# Patient Record
Sex: Male | Born: 1935 | Race: White | Hispanic: No | Smoking: Former smoker
Health system: Southern US, Community
[De-identification: ages and names within clinical notes are randomized; demographics above are authoritative.]

## PROBLEM LIST (undated history)

## (undated) DIAGNOSIS — M5416 Radiculopathy, lumbar region: Secondary | ICD-10-CM

## (undated) DIAGNOSIS — Z9289 Personal history of other medical treatment: Secondary | ICD-10-CM

## (undated) DIAGNOSIS — I4891 Unspecified atrial fibrillation: Secondary | ICD-10-CM

## (undated) DIAGNOSIS — M5136 Other intervertebral disc degeneration, lumbar region: Secondary | ICD-10-CM

## (undated) DIAGNOSIS — Z7901 Long term (current) use of anticoagulants: Secondary | ICD-10-CM

## (undated) DIAGNOSIS — U071 COVID-19: Secondary | ICD-10-CM

## (undated) DIAGNOSIS — I639 Cerebral infarction, unspecified: Secondary | ICD-10-CM

## (undated) DIAGNOSIS — M51369 Other intervertebral disc degeneration, lumbar region without mention of lumbar back pain or lower extremity pain: Secondary | ICD-10-CM

## (undated) DIAGNOSIS — I219 Acute myocardial infarction, unspecified: Secondary | ICD-10-CM

## (undated) DIAGNOSIS — I483 Typical atrial flutter: Secondary | ICD-10-CM

## (undated) DIAGNOSIS — Z9889 Other specified postprocedural states: Secondary | ICD-10-CM

## (undated) DIAGNOSIS — I10 Essential (primary) hypertension: Secondary | ICD-10-CM

## (undated) DIAGNOSIS — G4730 Sleep apnea, unspecified: Secondary | ICD-10-CM

## (undated) DIAGNOSIS — I350 Nonrheumatic aortic (valve) stenosis: Secondary | ICD-10-CM

## (undated) DIAGNOSIS — K219 Gastro-esophageal reflux disease without esophagitis: Secondary | ICD-10-CM

## (undated) DIAGNOSIS — E119 Type 2 diabetes mellitus without complications: Secondary | ICD-10-CM

## (undated) DIAGNOSIS — I2510 Atherosclerotic heart disease of native coronary artery without angina pectoris: Secondary | ICD-10-CM

## (undated) DIAGNOSIS — C130 Malignant neoplasm of postcricoid region: Secondary | ICD-10-CM

## (undated) DIAGNOSIS — I509 Heart failure, unspecified: Secondary | ICD-10-CM

## (undated) DIAGNOSIS — G459 Transient cerebral ischemic attack, unspecified: Secondary | ICD-10-CM

## (undated) DIAGNOSIS — I340 Nonrheumatic mitral (valve) insufficiency: Secondary | ICD-10-CM

## (undated) DIAGNOSIS — I050 Rheumatic mitral stenosis: Secondary | ICD-10-CM

## (undated) DIAGNOSIS — I351 Nonrheumatic aortic (valve) insufficiency: Secondary | ICD-10-CM

## (undated) DIAGNOSIS — R001 Bradycardia, unspecified: Secondary | ICD-10-CM

## (undated) DIAGNOSIS — E7800 Pure hypercholesterolemia, unspecified: Secondary | ICD-10-CM

## (undated) HISTORY — PX: CARDIAC ELECTROPHYSIOLOGY STUDY AND ABLATION: SHX1294

## (undated) HISTORY — PX: OTHER SURGICAL HISTORY: SHX169

## (undated) HISTORY — DX: Cerebral infarction, unspecified: I63.9

## (undated) HISTORY — DX: Bradycardia, unspecified: R00.1

## (undated) HISTORY — DX: Nonrheumatic aortic (valve) stenosis: I35.0

## (undated) HISTORY — DX: Personal history of other medical treatment: Z92.89

## (undated) HISTORY — DX: Long term (current) use of anticoagulants: Z79.01

## (undated) HISTORY — DX: Type 2 diabetes mellitus without complications: E11.9

## (undated) HISTORY — DX: Other intervertebral disc degeneration, lumbar region: M51.36

## (undated) HISTORY — DX: Typical atrial flutter: I48.3

## (undated) HISTORY — DX: Sleep apnea, unspecified: G47.30

## (undated) HISTORY — DX: COVID-19: U07.1

## (undated) HISTORY — DX: Other intervertebral disc degeneration, lumbar region without mention of lumbar back pain or lower extremity pain: M51.369

## (undated) HISTORY — DX: Nonrheumatic aortic (valve) insufficiency: I35.1

## (undated) HISTORY — DX: Other specified postprocedural states: Z98.890

## (undated) HISTORY — DX: Unspecified atrial fibrillation: I48.91

## (undated) HISTORY — DX: Rheumatic mitral stenosis: I05.0

## (undated) HISTORY — PX: CATARACT EXTRACTION: SUR2

## (undated) HISTORY — DX: Gastro-esophageal reflux disease without esophagitis: K21.9

## (undated) HISTORY — DX: Acute myocardial infarction, unspecified: I21.9

## (undated) HISTORY — DX: Malignant neoplasm of postcricoid region: C13.0

## (undated) HISTORY — PX: GALLBLADDER SURGERY: SHX652

## (undated) HISTORY — DX: Nonrheumatic mitral (valve) insufficiency: I34.0

## (undated) HISTORY — DX: Radiculopathy, lumbar region: M54.16

## (undated) HISTORY — PX: HX CATARACT REMOVAL: SHX102

## (undated) HISTORY — DX: Acute myocardial infarction, unspecified (CMS HCC): I21.9

## (undated) HISTORY — DX: Pure hypercholesterolemia, unspecified: E78.00

## (undated) HISTORY — DX: Unspecified atrial fibrillation (CMS HCC): I48.91

## (undated) HISTORY — DX: Cerebral infarction, unspecified (CMS HCC): I63.9

## (undated) HISTORY — DX: Essential (primary) hypertension: I10

## (undated) HISTORY — DX: Type 2 diabetes mellitus without complications (CMS HCC): E11.9

## (undated) HISTORY — DX: Heart failure, unspecified (CMS HCC): I50.9

## (undated) HISTORY — PX: HX GALL BLADDER SURGERY/CHOLE: SHX55

---

## 2014-01-23 DIAGNOSIS — M549 Dorsalgia, unspecified: Secondary | ICD-10-CM

## 2014-01-23 DIAGNOSIS — I959 Hypotension, unspecified: Secondary | ICD-10-CM

## 2014-01-23 DIAGNOSIS — IMO0002 Reserved for concepts with insufficient information to code with codable children: Secondary | ICD-10-CM

## 2014-01-23 DIAGNOSIS — S06330A Contusion and laceration of cerebrum, unspecified, without loss of consciousness, initial encounter: Secondary | ICD-10-CM

## 2014-01-24 DIAGNOSIS — J95821 Acute postprocedural respiratory failure: Secondary | ICD-10-CM

## 2014-01-25 DIAGNOSIS — D72829 Elevated white blood cell count, unspecified: Secondary | ICD-10-CM

## 2014-01-26 DIAGNOSIS — I10 Essential (primary) hypertension: Secondary | ICD-10-CM

## 2014-01-31 DIAGNOSIS — I808 Phlebitis and thrombophlebitis of other sites: Secondary | ICD-10-CM

## 2014-02-03 DIAGNOSIS — IMO0002 Reserved for concepts with insufficient information to code with codable children: Secondary | ICD-10-CM

## 2014-02-03 DIAGNOSIS — I4892 Unspecified atrial flutter: Secondary | ICD-10-CM

## 2014-02-04 DIAGNOSIS — L02519 Cutaneous abscess of unspecified hand: Secondary | ICD-10-CM

## 2014-02-04 DIAGNOSIS — L03119 Cellulitis of unspecified part of limb: Secondary | ICD-10-CM

## 2014-02-04 DIAGNOSIS — I808 Phlebitis and thrombophlebitis of other sites: Secondary | ICD-10-CM

## 2014-02-05 DIAGNOSIS — Z09 Encounter for follow-up examination after completed treatment for conditions other than malignant neoplasm: Secondary | ICD-10-CM

## 2014-02-18 DIAGNOSIS — Z09 Encounter for follow-up examination after completed treatment for conditions other than malignant neoplasm: Secondary | ICD-10-CM

## 2014-02-27 ENCOUNTER — Encounter (INDEPENDENT_AMBULATORY_CARE_PROVIDER_SITE_OTHER): Payer: Self-pay | Admitting: WVUPC-ELECTROPHYSIOLOGY

## 2014-03-26 ENCOUNTER — Ambulatory Visit (INDEPENDENT_AMBULATORY_CARE_PROVIDER_SITE_OTHER): Payer: Medicare Other | Admitting: Nurse Practitioner

## 2014-03-26 ENCOUNTER — Encounter (INDEPENDENT_AMBULATORY_CARE_PROVIDER_SITE_OTHER): Payer: Self-pay | Admitting: Nurse Practitioner

## 2014-03-26 VITALS — BP 126/77 | HR 109 | Ht 70.0 in | Wt 173.0 lb

## 2014-03-26 DIAGNOSIS — I2510 Atherosclerotic heart disease of native coronary artery without angina pectoris: Secondary | ICD-10-CM

## 2014-03-26 DIAGNOSIS — I498 Other specified cardiac arrhythmias: Secondary | ICD-10-CM

## 2014-03-26 DIAGNOSIS — Z7901 Long term (current) use of anticoagulants: Secondary | ICD-10-CM

## 2014-03-26 DIAGNOSIS — R001 Bradycardia, unspecified: Secondary | ICD-10-CM

## 2014-03-26 DIAGNOSIS — I10 Essential (primary) hypertension: Secondary | ICD-10-CM

## 2014-03-26 DIAGNOSIS — Z8673 Personal history of transient ischemic attack (TIA), and cerebral infarction without residual deficits: Secondary | ICD-10-CM

## 2014-03-26 DIAGNOSIS — I483 Typical atrial flutter: Secondary | ICD-10-CM | POA: Insufficient documentation

## 2014-03-26 DIAGNOSIS — Z79899 Other long term (current) drug therapy: Secondary | ICD-10-CM

## 2014-03-26 DIAGNOSIS — I4892 Unspecified atrial flutter: Secondary | ICD-10-CM

## 2014-03-26 NOTE — Patient Instructions (Signed)
Patient is to monitor blood pressure and/or heart rate as directed.  Patient is to notify clinic of any difficulties with prescribed treatment regimen or worsening of symptoms.

## 2014-03-26 NOTE — Procedures (Signed)
See progress note for ECG interpretation.

## 2014-03-26 NOTE — Progress Notes (Signed)
Medical Staff Office [facility]  Electrophysiology  [address], [address]  [place], [place] [address]          Patient name:  Wesley Huang  MRN:  [id number]  DOB:  12-Nov-1933  DATE:  03/26/2014  80 y.o.      Chief Complaint: Arrhythmia    History of Present Illness:  It was a pleasure to see your patient Wesley Huang, here at the [facility] for follow-up evaluation of Amiodarone therapy for treatment of atrial fibrillation.  The patient states he is taking his medication as directed.  The patient is tolerating the medication well.  There have been no episodes of significant palpitations since the last visit.  The patient denies any light-headedness, dizziness, syncope, or near syncope.  He states that he has been having issues with his shortness of breath and CHF, however it is improving over the last two weeks since his cardiologist started him on Lasix.    Past Medical History:  Past Medical History   Diagnosis Date    Atrial fibrillation     Congestive heart failure, unspecified     Diabetes mellitus, type 2     Hypercholesterolemia     Hypertension     MI (myocardial infarction)     CVA (cerebrovascular accident)          Past Surgical History:  Past Surgical History   Procedure Laterality Date    Hx cataract removal      Hx gall bladder surgery/chole         Social History:  History   Substance Use Topics    Smoking status: Never Smoker     Smokeless tobacco: Not on file    Alcohol Use: No     History   Drug Use No       Family History:  Family History:  Family History   Problem Relation Age of Onset    Healthy Son     Healthy Daughter     Healthy Daughter            Allergies:  No Known Allergies    Medications:  Current Outpatient Prescriptions   Medication Sig    ACETAMINOPHEN (TYLENOL 8 HOUR ORAL) Take by mouth    amLODIPine (NORVASC) 5 mg Oral Tablet Take 5 mg by mouth Once a day    clopidogrel (PLAVIX) 75 mg Oral Tablet Take 75 mg by mouth  Once a day    DIPHENHYDRAMINE HCL (BENADRYL ALLERGY ORAL) Take by mouth Once per day as needed    furosemide (LASIX) 40 mg Oral Tablet Take 40 mg by mouth Once a day    gabapentin (NEURONTIN) 300 mg Oral Capsule Take 300 mg by mouth Three times a day    lisinopril (PRINIVIL) 20 mg Oral Tablet Take 20 mg by mouth Once a day    metFORMIN (GLUCOPHAGE) 500 mg Oral Tablet Take 500 mg by mouth Twice daily with food    olmesartan (BENICAR) 20 mg Oral Tablet Take 20 mg by mouth Once a day Pt takes 12.5 mg daily    Oxycodone-Acetaminophen (PERCOCET) 7.5-325 mg Oral Tablet Take 1 Tab by mouth Every 4 hours as needed    pediatric multivitamins Oral Tablet, Chewable Take 1 Tab by mouth Once a day    rosuvastatin (CRESTOR) 10 mg Oral Tablet Take 10 mg by mouth Once a day    warfarin (COUMADIN) 2.5 mg Oral Tablet Take 2.5 mg by mouth Every evening  Review of Systems:  Complete review of systems negative with the exception of those things listed in the HPI and past medical history.      Examination:  BP 126/77 mmHg   Pulse 109   Ht 1.778 m ( )   Wt 78.472 kg (173 lb)   BMI 24.82 kg/m2  General: appears in good health  Eyes: Conjunctiva clear., Pupils equal and round.   HENT:  Head atraumatic and normocephalic  Neck: No JVD or thyromegaly or lymphadenopathy  Lungs: Clear to auscultation bilaterally.   Cardiovascular: regular rate and rhythm  Abdomen: Soft, non-tender, Bowel sounds normal  Extremities: No cyanosis or edema  Skin: Skin warm and dry, No rashes and No lesions  Neurologic: CN II - XII grossly intact , Alert and oriented x3  Lymphatics: No lymphadenopathy  Psychiatric: Appropriate mood and affect      Data reviewed:  An ECG dated 03/26/2014  shows Sinus Tachycardia at  107 bpm.  QRS 78 msec.  QT 298 msec.  QTc 361 msec.     Assessment and Plan:  1. Coronary artery disease involving native coronary artery without angina pectoris    2. H/O: CVA (cerebrovascular accident)    3. Sinus bradycardia    4.  Essential hypertension    5. Typical atrial flutter        The patient will continue with the current dose of Amiodarone 200 mg daily.  He will continue to obtain every 6 month liver function and thyroid profile testing.  He will also continue to obtain a yearly CXR.  The CHADS-VASc score is 6 resulting from CHF, Hypertension, Age > 75, Diabetes Mellitus and Stroke.  He will Continue use of Coumadin.   We have discussed the importance of compliance with the prescribed medication regimen.      It has been a pleasure to see your patient here at the [facility] Electrophysiology [facility].  Please do not hesitate to contact us with any questions or concerns.      Sylvester Harder, FNP-C

## 2014-05-04 DIAGNOSIS — Z79899 Other long term (current) drug therapy: Secondary | ICD-10-CM | POA: Insufficient documentation

## 2014-05-04 DIAGNOSIS — Z7901 Long term (current) use of anticoagulants: Secondary | ICD-10-CM | POA: Insufficient documentation

## 2014-05-29 ENCOUNTER — Encounter (INDEPENDENT_AMBULATORY_CARE_PROVIDER_SITE_OTHER): Payer: Self-pay

## 2014-05-29 ENCOUNTER — Ambulatory Visit (INDEPENDENT_AMBULATORY_CARE_PROVIDER_SITE_OTHER): Payer: Medicare Other

## 2014-05-29 VITALS — BP 127/81 | HR 96 | Ht 70.0 in | Wt 168.4 lb

## 2014-05-29 DIAGNOSIS — Z5181 Encounter for therapeutic drug level monitoring: Secondary | ICD-10-CM

## 2014-05-29 DIAGNOSIS — I483 Typical atrial flutter: Secondary | ICD-10-CM

## 2014-05-29 DIAGNOSIS — I513 Intracardiac thrombosis, not elsewhere classified: Secondary | ICD-10-CM

## 2014-05-29 DIAGNOSIS — I10 Essential (primary) hypertension: Secondary | ICD-10-CM

## 2014-05-29 DIAGNOSIS — R001 Bradycardia, unspecified: Secondary | ICD-10-CM

## 2014-05-29 DIAGNOSIS — I484 Atypical atrial flutter: Secondary | ICD-10-CM

## 2014-05-29 DIAGNOSIS — Z7901 Long term (current) use of anticoagulants: Secondary | ICD-10-CM

## 2014-05-29 DIAGNOSIS — Z79899 Other long term (current) drug therapy: Secondary | ICD-10-CM

## 2014-05-29 DIAGNOSIS — Z8673 Personal history of transient ischemic attack (TIA), and cerebral infarction without residual deficits: Secondary | ICD-10-CM

## 2014-05-29 DIAGNOSIS — I2510 Atherosclerotic heart disease of native coronary artery without angina pectoris: Secondary | ICD-10-CM

## 2014-05-29 DIAGNOSIS — I213 ST elevation (STEMI) myocardial infarction of unspecified site: Secondary | ICD-10-CM

## 2014-05-29 NOTE — Progress Notes (Signed)
[facility]rophysiology[facility]  [address], [address]  [place] [id number]  [phone]            Patient name:  Wesley Huang  MRN:  [id number]  DOB:  25-Dec-1933  DATE:  05/29/2014  AGE: 80 y.o.      Chief Complaint: Arrhythmia    History of Present Illness:  80 y.o. male with a history of atrial fibrillation and atypical flutter, coronary artery disease, and LA thrombus who presents for follow-up. He went to see his cardiologist several weeks ago and was found to be in atypical atrial flutter. He is completely asymptomatic. He states the checks his heart rate and blood pressure at home and denies any heart rates >100 beats per minute. He denies any chest pain, palpitations, or dizziness. He denies any blood or bleeding.    He is scheduled for a repeat TEE on 06/16/2014 to assess his left atrial thrombus.      Past Medical History:  Past Medical History   Diagnosis Date    Atrial fibrillation     Congestive heart failure, unspecified     Diabetes mellitus, type 2     Hypercholesterolemia     Hypertension     MI (myocardial infarction)     CVA (cerebrovascular accident)              Past Surgical History   Procedure Laterality Date    Hx cataract removal      Hx gall bladder surgery/chole             Medications:  Current Outpatient Prescriptions   Medication Sig    ACETAMINOPHEN (TYLENOL 8 HOUR ORAL) Take by mouth    amLODIPine (NORVASC) 10 mg Oral Tablet Take 10 mg by mouth Once a day    DIPHENHYDRAMINE HCL (BENADRYL ALLERGY ORAL) Take by mouth Once per day as needed    furosemide (LASIX) 40 mg Oral Tablet Take 40 mg by mouth Once a day    gabapentin (NEURONTIN) 300 mg Oral Capsule Take 300 mg by mouth Three times a day    metFORMIN (GLUCOPHAGE) 500 mg Oral Tablet Take 500 mg by mouth Twice daily with food    metoprolol (LOPRESSOR) 25 mg Oral Tablet Take 25 mg by mouth Twice daily    Olmesartan (BENICAR) 40 mg Oral Tablet Take 40 mg by mouth Once a day     rosuvastatin (CRESTOR) 10 mg Oral Tablet Take 10 mg by mouth Once a day    warfarin (COUMADIN) 2.5 mg Oral Tablet Take 2.5 mg by mouth Every evening       Allergies:  No Known Allergies    Family History:  Family History   Problem Relation Age of Onset    Healthy Son     Healthy Daughter     Healthy Daughter            Social History:  History   Substance Use Topics    Smoking status: Never Smoker     Smokeless tobacco: Never Used    Alcohol Use: No       Review of Systems:  11 point review of systems is otherwise negative except per hpi and pmh.    Examination:  BP 127/81 mmHg   Pulse 96   Ht 1.778 m (5\' 10" )   Wt 76.386 kg (168 lb 6.4 oz)   BMI 24.16 kg/m2  GEN- AO, NAD  HEENT- Anicteric, OP mmm  NECK- Supple, no  nuchal rigidity, no elevated JVD  PULM- CTAB, no w/r/s/c/acc muscle use  C/V- RR, S1 S2, no m/r/g, no RV heave  ABD- NABS, soft, nt/nd  EXT- No LE edema, RP 2+ Bil  NM- Sitting up independently  PSYCH- Normal mood and affect    Data reviewed:  An ECG dated today shows atypical flutter rate 91 beats per minute, QRS 86 msec, QT 428 msec, QTc 491 msec. Normal axis    Assessment and Plan:  1. Coronary artery disease involving native coronary artery without angina pectoris    2. H/O: CVA (cerebrovascular accident)    3. Sinus bradycardia    4. Typical atrial flutter    5. On amiodarone therapy    6. Anticoagulated on Coumadin    7. Essential hypertension    8. Left atrial thrombus      1) Atypical Flutter: Mr. Wesley Huang is back in atypical flutter despite amiodarone. He is completely asymptomatic and heart rate today is adequate. He is going to keep a log of his heart rates at home. As amiodarone has failed and he is asymptomatic, will stop amiodarone and pursue rate control strategy. Patient will keep a log of his heart rates and bring them to his next appointment. He needs to continue life long anticoagulation for stroke prevention.    Return in about 3 months (around 08/29/2014) for Atypical atrial  flutter .    It has been a [facility] your [facility]. Please do not hesitate to contact us with any questions or concerns.    Dayna Barkerania Rossi Silvestro, MD  05/29/2014

## 2014-05-29 NOTE — Patient Instructions (Signed)
-  Stop Amiodarone  -You have a TEE 06/16/2014 with Dr. Guinevere ScarletMandapaka  -Monitor your heart rates, log them   -If your resting heart rates are >100 beats per minute, call the office  -Continue Coumadin

## 2014-06-08 NOTE — Procedures (Signed)
See progress notes

## 2014-06-15 ENCOUNTER — Encounter (INDEPENDENT_AMBULATORY_CARE_PROVIDER_SITE_OTHER): Payer: Medicare Other | Admitting: Nurse Practitioner

## 2014-09-03 ENCOUNTER — Encounter (INDEPENDENT_AMBULATORY_CARE_PROVIDER_SITE_OTHER): Payer: Medicare Other | Admitting: NURSE PRACTITIONER

## 2016-11-08 DIAGNOSIS — Z9889 Other specified postprocedural states: Secondary | ICD-10-CM

## 2016-11-08 HISTORY — DX: Other specified postprocedural states: Z98.890

## 2017-07-31 LAB — HM COLONOSCOPY

## 2017-11-18 ENCOUNTER — Emergency Department (HOSPITAL_BASED_OUTPATIENT_CLINIC_OR_DEPARTMENT_OTHER)
Admission: EM | Admit: 2017-11-18 | Discharge: 2017-11-18 | Disposition: A | Discharge status: Home / Self Care | Payer: Medicare Other | Attending: Emergency Medicine | Admitting: Emergency Medicine

## 2017-11-18 ENCOUNTER — Emergency Department (HOSPITAL_BASED_OUTPATIENT_CLINIC_OR_DEPARTMENT_OTHER): Payer: Medicare Other

## 2017-11-18 ENCOUNTER — Other Ambulatory Visit: Payer: Self-pay

## 2017-11-18 ENCOUNTER — Encounter (HOSPITAL_BASED_OUTPATIENT_CLINIC_OR_DEPARTMENT_OTHER): Payer: Self-pay | Admitting: *Deleted

## 2017-11-18 DIAGNOSIS — R1031 Right lower quadrant pain: Secondary | ICD-10-CM | POA: Insufficient documentation

## 2017-11-18 DIAGNOSIS — R1032 Left lower quadrant pain: Secondary | ICD-10-CM | POA: Insufficient documentation

## 2017-11-18 DIAGNOSIS — R112 Nausea with vomiting, unspecified: Secondary | ICD-10-CM | POA: Insufficient documentation

## 2017-11-18 DIAGNOSIS — J189 Pneumonia, unspecified organism: Secondary | ICD-10-CM | POA: Diagnosis not present

## 2017-11-18 DIAGNOSIS — Z79899 Other long term (current) drug therapy: Secondary | ICD-10-CM | POA: Diagnosis not present

## 2017-11-18 DIAGNOSIS — I2510 Atherosclerotic heart disease of native coronary artery without angina pectoris: Secondary | ICD-10-CM | POA: Diagnosis not present

## 2017-11-18 DIAGNOSIS — I509 Heart failure, unspecified: Secondary | ICD-10-CM | POA: Insufficient documentation

## 2017-11-18 DIAGNOSIS — I110 Hypertensive heart disease with heart failure: Secondary | ICD-10-CM | POA: Diagnosis not present

## 2017-11-18 DIAGNOSIS — Z7901 Long term (current) use of anticoagulants: Secondary | ICD-10-CM | POA: Insufficient documentation

## 2017-11-18 HISTORY — DX: Atherosclerotic heart disease of native coronary artery without angina pectoris: I25.10

## 2017-11-18 HISTORY — DX: Essential (primary) hypertension: I10

## 2017-11-18 HISTORY — DX: Transient cerebral ischemic attack, unspecified: G45.9

## 2017-11-18 HISTORY — DX: Heart failure, unspecified: I50.9

## 2017-11-18 LAB — CBC WITH DIFFERENTIAL/PLATELET
BASOS PCT: 0 %
Basophils Absolute: 0 10*3/uL (ref 0.0–0.1)
EOS ABS: 0 10*3/uL (ref 0.0–0.7)
Eosinophils Relative: 0 %
HEMATOCRIT: 42.8 % (ref 39.0–52.0)
Hemoglobin: 14.8 g/dL (ref 13.0–17.0)
Lymphocytes Relative: 6 %
Lymphs Abs: 0.8 10*3/uL (ref 0.7–4.0)
MCH: 31 pg (ref 26.0–34.0)
MCHC: 34.6 g/dL (ref 30.0–36.0)
MCV: 89.5 fL (ref 78.0–100.0)
MONOS PCT: 4 %
Monocytes Absolute: 0.7 10*3/uL (ref 0.1–1.0)
NEUTROS ABS: 13.5 10*3/uL — AB (ref 1.7–7.7)
Neutrophils Relative %: 90 %
Platelets: 211 10*3/uL (ref 150–400)
RBC: 4.78 MIL/uL (ref 4.22–5.81)
RDW: 14 % (ref 11.5–15.5)
WBC: 15 10*3/uL — ABNORMAL HIGH (ref 4.0–10.5)

## 2017-11-18 LAB — I-STAT CG4 LACTIC ACID, ED: Lactic Acid, Venous: 1.92 mmol/L — ABNORMAL HIGH (ref 0.5–1.9)

## 2017-11-18 LAB — COMPREHENSIVE METABOLIC PANEL
ALK PHOS: 89 U/L (ref 38–126)
ALT: 22 U/L (ref 17–63)
AST: 25 U/L (ref 15–41)
Albumin: 4.2 g/dL (ref 3.5–5.0)
Anion gap: 10 (ref 5–15)
BUN: 18 mg/dL (ref 6–20)
CALCIUM: 8.7 mg/dL — AB (ref 8.9–10.3)
CO2: 21 mmol/L — AB (ref 22–32)
Chloride: 103 mmol/L (ref 101–111)
Creatinine, Ser: 1.04 mg/dL (ref 0.61–1.24)
GFR calc Af Amer: >60 mL/min (ref >60)
GFR calc non Af Amer: >60 mL/min (ref >60)
Glucose, Bld: 165 mg/dL — ABNORMAL HIGH (ref 65–99)
Potassium: 4.2 mmol/L (ref 3.5–5.1)
SODIUM: 134 mmol/L — AB (ref 135–145)
Total Bilirubin: 0.8 mg/dL (ref 0.3–1.2)
Total Protein: 7.5 g/dL (ref 6.5–8.1)

## 2017-11-18 LAB — URINALYSIS, ROUTINE W REFLEX MICROSCOPIC
Bilirubin Urine: NEGATIVE
Glucose, UA: NEGATIVE mg/dL
Ketones, ur: NEGATIVE mg/dL
Leukocytes, UA: NEGATIVE
NITRITE: NEGATIVE
PH: 7 (ref 5.0–8.0)
Protein, ur: NEGATIVE mg/dL
Specific Gravity, Urine: <1.005 — ABNORMAL LOW (ref 1.005–1.030)

## 2017-11-18 LAB — URINALYSIS, MICROSCOPIC (REFLEX)

## 2017-11-18 MED ORDER — AZITHROMYCIN 250 MG PO TABS: 250.0000 mg | ORAL_TABLET | Freq: Every day | ORAL | 0 refills | Status: DC

## 2017-11-18 MED ORDER — CEPHALEXIN 500 MG PO CAPS: 500.0000 mg | ORAL_CAPSULE | Freq: Two times a day (BID) | ORAL | 0 refills | Status: AC

## 2017-11-18 MED ORDER — ONDANSETRON 4 MG PO TBDP: ORAL_TABLET | ORAL | 0 refills | Status: DC

## 2017-11-18 MED ORDER — MORPHINE SULFATE (PF) 2 MG/ML IV SOLN
2.0000 mg | Freq: Once | INTRAVENOUS | Status: AC
  Administered 2017-11-18: 2 mg via INTRAVENOUS
  Filled 2017-11-18: qty 1

## 2017-11-18 MED ORDER — ONDANSETRON HCL 4 MG/2ML IJ SOLN
4.0000 mg | Freq: Once | INTRAMUSCULAR | Status: AC
  Administered 2017-11-18: 4 mg via INTRAVENOUS
  Filled 2017-11-18: qty 2

## 2017-11-18 MED ORDER — SODIUM CHLORIDE 0.9 % IV BOLUS
1000.0000 mL | Freq: Once | INTRAVENOUS | Status: AC
  Administered 2017-11-18: 1000 mL via INTRAVENOUS

## 2017-11-18 MED ORDER — IOPAMIDOL (ISOVUE-300) INJECTION 61%
100.0000 mL | Freq: Once | INTRAVENOUS | Status: AC | PRN
  Administered 2017-11-18: 100 mL via INTRAVENOUS

## 2017-11-18 NOTE — Discharge Instructions (Signed)
Follow up with your PCP, return for worsening or inability to eat or drink.

## 2017-11-18 NOTE — ED Provider Notes (Signed)
[place] EMERGENCY DEPARTMENT Provider Note   CSN: [id number] Arrival date & time: 11/18/17  1924     History   Chief Complaint Chief Complaint  Patient presents with  . Emesis  . Weakness    HPI Jose Patel is a 84 y.o. male.  84 yo M with a chief complaint of fever nausea and vomiting.  This been going on for about 8 hours.  Is not been able to keep anything down.  Is felt generally weak.  Denies cough or congestion denies myalgias.  He feels that he has pain in his abdomen is worse in the left lower quadrant.  Denies diarrhea.  Denies prior abdominal surgery.  Denies sick contacts.  The history is provided by the patient.  Abdominal Pain   This is a new problem. The current episode started 6 to 12 hours ago. The problem occurs constantly. The problem has not changed since onset.The pain is associated with an unknown factor. The pain is located in the LLQ. The quality of the pain is sharp and shooting. The pain is at a severity of 8/10. The pain is moderate. Associated symptoms include fever and vomiting. Pertinent negatives include diarrhea, headaches, arthralgias and myalgias. Nothing aggravates the symptoms. Nothing relieves the symptoms.    Past Medical History:  Diagnosis Date  . CHF (congestive heart failure) ([place])   . Coronary artery disease   . Hypertension   . Transient ischemic attack    10 to 12 years ago    There are no active problems to display for this patient.   Past Surgical History:  Procedure Laterality Date  . Debridement to right arm with MRSA infection     4 years ago        Home Medications    Prior to Admission medications   Medication Sig Start Date End Date Taking? Authorizing Provider  losartan (COZAAR) 25 MG tablet Take 25 mg by mouth daily.   Yes [provider]  rivaroxaban (XARELTO) 20 MG TABS tablet Take 20 mg by mouth daily with supper.   Yes [provider]  simvastatin (ZOCOR) 10 MG tablet Take  10 mg by mouth daily.   Yes [provider]  azithromycin (ZITHROMAX) 250 MG tablet Take 1 tablet (250 mg total) by mouth daily. Take first 2 tablets together, then 1 every day until finished. 11/18/17   Deno Etienne, DO  cephALEXin (KEFLEX) 500 MG capsule Take 1 capsule (500 mg total) by mouth 2 (two) times daily for 5 days. 11/18/17 11/23/17  Deno Etienne, DO  ondansetron (ZOFRAN ODT) 4 MG disintegrating tablet 4mg  ODT q4 hours prn nausea/vomit 11/18/17   Deno Etienne, DO    Family History Family History  Problem Relation Age of Onset  . Heart failure Mother   . Hyperlipidemia Mother     Social History Social History   Tobacco Use  . Smoking status: Never Smoker  Substance Use Topics  . Alcohol use: Never    Frequency: Never  . Drug use: Never     Allergies   Patient has no known allergies.   Review of Systems Review of Systems  Constitutional: Positive for fever. Negative for chills.  HENT: Negative for congestion and facial swelling.   Eyes: Negative for discharge and visual disturbance.  Respiratory: Negative for shortness of breath.   Cardiovascular: Negative for chest pain and palpitations.  Gastrointestinal: Positive for abdominal pain and vomiting. Negative for diarrhea.  Musculoskeletal: Negative for arthralgias and myalgias.  Skin:  Negative for color change and rash.  Neurological: Negative for tremors, syncope and headaches.  Psychiatric/Behavioral: Negative for confusion and dysphoric mood.     Physical Exam Updated Vital Signs BP (!) 158/73 (BP Location: Right Arm)   Pulse 78   Temp (!) 100.7 F (38.2 C) (Oral)   Resp 20   Ht 5\' 10"  (1.778 m)   Wt 79.4 kg (175 lb)   SpO2 96%   BMI 25.11 kg/m   Physical Exam  Constitutional: He is oriented to person, place, and time. He appears well-developed and well-nourished.  HENT:  Head: Normocephalic and atraumatic.  Eyes: Pupils are equal, round, and reactive to light. EOM are normal.  Neck: Normal  range of motion. Neck supple. No JVD present.  Cardiovascular: Normal rate and regular rhythm. Exam reveals no gallop and no friction rub.  No murmur heard. Pulmonary/Chest: No respiratory distress. He has no wheezes.  Abdominal: He exhibits no distension and no mass. There is tenderness (very mild LLQ pain). There is no rebound and no guarding.  Musculoskeletal: Normal range of motion.  Neurological: He is alert and oriented to person, place, and time.  Skin: No rash noted. No pallor.  Psychiatric: He has a normal mood and affect. His behavior is normal.  Nursing note and vitals reviewed.    ED Treatments / Results  Labs (all labs ordered are listed, but only abnormal results are displayed) Labs Reviewed  COMPREHENSIVE METABOLIC PANEL - Abnormal; Notable for the following components:      Result Value   Sodium 134 (*)    CO2 21 (*)    Glucose, Bld 165 (*)    Calcium 8.7 (*)    All other components within normal limits  CBC WITH DIFFERENTIAL/PLATELET - Abnormal; Notable for the following components:   WBC 15.0 (*)    Neutro Abs 13.5 (*)    All other components within normal limits  I-STAT CG4 LACTIC ACID, ED - Abnormal; Notable for the following components:   Lactic Acid, Venous 1.92 (*)    All other components within normal limits  CULTURE, BLOOD (ROUTINE X 2)  CULTURE, BLOOD (ROUTINE X 2)  URINE CULTURE  URINALYSIS, ROUTINE W REFLEX MICROSCOPIC    EKG EKG Interpretation  Date/Time:  Sunday November 18 2017 20:12:34 EDT Ventricular Rate:  64 PR Interval:    QRS Duration: 85 QT Interval:  451 QTC Calculation: 466 R Axis:   -16 Text Interpretation:  Sinus rhythm Borderline left axis deviation Baseline wander in lead(s) V4 No old tracing to compare Confirmed by Deno Etienne [phone]) on 11/18/2017 8:15:26 PM   Radiology Dg Chest 2 View  Result Date: 11/18/2017 CLINICAL DATA:  Nausea and vomiting EXAM: CHEST - 2 VIEW COMPARISON:  None. FINDINGS: Cardiac shadow is within  normal limits. Aortic calcifications are noted. The lungs are well aerated bilaterally. Bilateral nipple shadows are seen. Degenerative changes of the thoracic spine are noted. No focal infiltrate is seen. IMPRESSION: No acute abnormality noted. Electronically Signed   By: Inez Catalina M.D.   On: 11/18/2017 20:51   Ct Abdomen Pelvis W Contrast  Result Date: 11/18/2017 CLINICAL DATA:  Acute onset of right lower quadrant abdominal pain, nausea and vomiting. EXAM: CT ABDOMEN AND PELVIS WITH CONTRAST TECHNIQUE: Multidetector CT imaging of the abdomen and pelvis was performed using the standard protocol following bolus administration of intravenous contrast. CONTRAST:  138mL ISOVUE-300 IOPAMIDOL (ISOVUE-300) INJECTION 61% COMPARISON:  None. FINDINGS: Lower chest: Mild bibasilar opacities may reflect pneumonia. Would correlate  with the patient's symptoms. Scattered coronary artery calcifications are seen. Hepatobiliary: Scattered calcified granulomata are noted within the liver. A 1.0 cm hypodensity is noted at the right hepatic lobe. The liver is otherwise unremarkable. The gallbladder is unremarkable. The common bile duct is normal in caliber. Pancreas: The pancreas is unremarkable in appearance. Peripancreatic nodes measure up to 1.0 cm in short axis. Spleen: Scattered calcified granulomata are noted within the spleen. Adrenals/Urinary Tract: The adrenal glands are unremarkable in appearance. Nonspecific perinephric stranding is noted bilaterally. Tiny bilateral renal cysts are noted. There is no evidence of hydronephrosis. No renal or ureteral stones are identified. Stomach/Bowel: The stomach is unremarkable in appearance. The small bowel is within normal limits. The appendix is normal in caliber, without evidence of appendicitis. The colon is unremarkable in appearance. Vascular/Lymphatic: Diffuse calcification is seen along the abdominal aorta and its branches. The abdominal aorta is otherwise grossly  unremarkable. The inferior vena cava is grossly unremarkable. No retroperitoneal lymphadenopathy is seen. No pelvic sidewall lymphadenopathy is identified. Reproductive: The bladder is mildly distended and grossly unremarkable. The prostate is enlarged, measuring 5.0 cm in transverse dimension. Other: No additional soft tissue abnormalities are seen. Musculoskeletal: No acute osseous abnormalities are identified. Facet disease is noted at the lower lumbar spine. The visualized musculature is unremarkable in appearance. IMPRESSION: 1. No acute abnormality seen to explain the patient's symptoms. The appendix is unremarkable in appearance. 2. Mild bibasilar airspace opacities may reflect pneumonia. Would correlate with the patient's symptoms. 3. Scattered coronary artery calcifications. 4. Tiny bilateral renal cysts. Nonspecific 1.0 cm hypodensity at the right hepatic lobe. 5. Peripancreatic nodes measure up to 1.0 cm in short axis. 6. Enlarged prostate noted. Electronically Signed   By: Garald Balding M.D.   On: 11/18/2017 21:17    Procedures Procedures (including critical care time)  Medications Ordered in ED Medications  morphine 2 MG/ML injection 2 mg (2 mg Intravenous Given 11/18/17 2004)  ondansetron (ZOFRAN) injection 4 mg (4 mg Intravenous Given 11/18/17 2004)  sodium chloride 0.9 % bolus 1,000 mL (0 mLs Intravenous Stopped 11/18/17 2137)  iopamidol (ISOVUE-300) 61 % injection 100 mL (100 mLs Intravenous Contrast Given 11/18/17 2049)     Initial Impression / Assessment and Plan / ED Course  I have reviewed the triage vital signs and the nursing notes.  Pertinent labs & imaging results that were available during my care of the patient were reviewed by me and considered in my medical decision making (see chart for details).     56 yoM with a chief complaint of nausea and vomiting fever.  He is also complaining of left lower quadrant abdominal pain.  He is febrile here.  No significant pain on my  abdominal exam.  Will obtain labs chest x-ray urine CT scan.  Patient feeling much better on reassessment.  Requesting to go home. CXR negative but CT concerning for BLL pna.  Patient with some cough over the past couple days.  Will treat.  PCP follow up.   9:50 PM:  I have discussed the diagnosis/risks/treatment options with the patient and family and believe the pt to be eligible for discharge home to follow-up with PCP. We also discussed returning to the ED immediately if new or worsening sx occur. We discussed the sx which are most concerning (e.g., sudden worsening pain, fever, inability to tolerate by mouth) that necessitate immediate return. Medications administered to the patient during their visit and any new prescriptions provided to the patient are listed below.  Medications given during this visit Medications  morphine 2 MG/ML injection 2 mg (2 mg Intravenous Given 11/18/17 2004)  ondansetron [facility]) injection 4 mg (4 mg Intravenous Given 11/18/17 2004)  sodium chloride 0.9 % bolus 1,000 mL (0 mLs Intravenous Stopped 11/18/17 2137)  iopamidol (ISOVUE-300) 61 % injection 100 mL (100 mLs Intravenous Contrast Given 11/18/17 2049)    Labs reviewed mild leukocytosis, lactic 1.9, renal function at baseline Images reviewed cxr viewed by me without focal infiltrate   The patient appears reasonably screen and/or stabilized for discharge and I doubt any other medical condition or other [facility] requiring further screening, evaluation, or treatment in the ED at this time prior to discharge.    Final Clinical Impressions(s) / ED Diagnoses   Final diagnoses:  Community acquired pneumonia, unspecified laterality    ED Discharge Orders        Ordered    cephALEXin (KEFLEX) 500 MG capsule  2 times daily     11/18/17 2144    azithromycin (ZITHROMAX) 250 MG tablet  Daily     11/18/17 2144    ondansetron (ZOFRAN ODT) 4 MG disintegrating tablet     11/18/17 2144       Deno Etienne, DO 11/18/17  2150

## 2017-11-18 NOTE — ED Triage Notes (Signed)
Feeling nauseated at 1130 and vomited x 5 and is feeling weak.

## 2017-11-18 NOTE — ED Notes (Signed)
ED Provider at bedside. 

## 2017-11-20 LAB — URINE CULTURE

## 2017-11-24 LAB — CULTURE, BLOOD (ROUTINE X 2)
Culture: NO GROWTH
Culture: NO GROWTH
SPECIAL REQUESTS: ADEQUATE
Special Requests: ADEQUATE

## 2018-02-15 LAB — BASIC METABOLIC PANEL
BUN: 20 (ref 4–21)
BUN: 20 (ref 4–21)
Creatinine: 1 (ref 0.6–1.3)
Creatinine: 1 (ref 0.6–1.3)
GLUCOSE: 131
Glucose: 131
Potassium: 4.3 (ref 3.4–5.3)
Potassium: 4.3 (ref 3.4–5.3)
Sodium: 141 (ref 137–147)
Sodium: 141 (ref 137–147)

## 2018-02-15 LAB — LIPID PANEL
CHOLESTEROL: 127 (ref 0–200)
Cholesterol: 127 (ref 0–200)
HDL: 34 — AB (ref 35–70)
HDL: 34 — AB (ref 35–70)
LDL CALC: 63
LDL Cholesterol: 63
Triglycerides: 149 (ref 40–160)
Triglycerides: 149 (ref 40–160)

## 2018-02-19 DIAGNOSIS — Z9289 Personal history of other medical treatment: Secondary | ICD-10-CM

## 2018-02-19 HISTORY — DX: Personal history of other medical treatment: Z92.89

## 2018-06-10 ENCOUNTER — Ambulatory Visit (INDEPENDENT_AMBULATORY_CARE_PROVIDER_SITE_OTHER): Payer: Medicare Other | Admitting: Nurse Practitioner

## 2018-06-10 ENCOUNTER — Encounter: Payer: Self-pay | Admitting: Nurse Practitioner

## 2018-06-10 VITALS — BP 142/78 | HR 64 | Temp 98.0°F | Ht 69.0 in | Wt 173.2 lb

## 2018-06-10 DIAGNOSIS — I10 Essential (primary) hypertension: Secondary | ICD-10-CM | POA: Diagnosis not present

## 2018-06-10 DIAGNOSIS — E119 Type 2 diabetes mellitus without complications: Secondary | ICD-10-CM | POA: Insufficient documentation

## 2018-06-10 DIAGNOSIS — I509 Heart failure, unspecified: Secondary | ICD-10-CM | POA: Insufficient documentation

## 2018-06-10 DIAGNOSIS — G4730 Sleep apnea, unspecified: Secondary | ICD-10-CM | POA: Diagnosis not present

## 2018-06-10 DIAGNOSIS — E1140 Type 2 diabetes mellitus with diabetic neuropathy, unspecified: Secondary | ICD-10-CM

## 2018-06-10 DIAGNOSIS — I2510 Atherosclerotic heart disease of native coronary artery without angina pectoris: Secondary | ICD-10-CM | POA: Insufficient documentation

## 2018-06-10 DIAGNOSIS — G4733 Obstructive sleep apnea (adult) (pediatric): Secondary | ICD-10-CM | POA: Insufficient documentation

## 2018-06-10 DIAGNOSIS — I480 Paroxysmal atrial fibrillation: Secondary | ICD-10-CM | POA: Insufficient documentation

## 2018-06-10 DIAGNOSIS — H6123 Impacted cerumen, bilateral: Secondary | ICD-10-CM

## 2018-06-10 DIAGNOSIS — E785 Hyperlipidemia, unspecified: Secondary | ICD-10-CM

## 2018-06-10 NOTE — Patient Instructions (Addendum)
Follow up in January for AWV, fasting labs and EV  Make sure AWV and fasting labs are done prior to EV   Okay to use tylenol/ acetaminophen to avoid NSAIDS (Aleve, Advil, Motrin, Ibuprofen)    Constipation, Adult Constipation is when a person has fewer bowel movements in a week than normal, has difficulty having a bowel movement, or has stools that are dry, hard, or larger than normal. Constipation may be caused by an underlying condition. It may become worse with age if a person takes certain medicines and does not take in enough fluids. Follow these instructions at home: Eating and drinking   Eat foods that have a lot of fiber, such as fresh fruits and vegetables, whole grains, and beans.  Limit foods that are high in fat, low in fiber, or overly processed, such as french fries, hamburgers, cookies, candies, and soda.  Drink enough fluid to keep your urine clear or pale yellow. General instructions  Exercise regularly or as told by your health care provider.  Go to the restroom when you have the urge to go. Do not hold it in.  Take over-the-counter and prescription medicines only as told by your health care provider. These include any fiber supplements.  Practice pelvic floor retraining exercises, such as deep breathing while relaxing the lower abdomen and pelvic floor relaxation during bowel movements.  Watch your condition for any changes.  Keep all follow-up visits as told by your health care provider. This is important. Contact a health care provider if:  You have pain that gets worse.  You have a fever.  You do not have a bowel movement after 4 days.  You vomit.  You are not hungry.  You lose weight.  You are bleeding from the anus.  You have thin, pencil-like stools. Get help right away if:  You have a fever and your symptoms suddenly get worse.  You leak stool or have blood in your stool.  Your abdomen is bloated.  You have severe pain in your  abdomen.  You feel dizzy or you faint. This information is not intended to replace advice given to you by your health care provider. Make sure you discuss any questions you have with your health care provider. Document Released: 04/14/2004 Document Revised: 02/04/2016 Document Reviewed: 01/05/2016 Elsevier Interactive Patient Education  2018 [employer].

## 2018-06-10 NOTE — Progress Notes (Signed)
Careteam: Patient Care Team: [name], NP as PCP - [facility] ([place], [facility] (Gastroenterology)  Advanced Directive information Does Patient Have a Medical Advance Directive?: Yes, Type of Advance Directive: [place];Living will  No Known Allergies  Chief Complaint  Patient presents with  . Medical Management of Chronic Issues    Pt is being seen to establish care. Pt recently relocated from [facility]. Pt needs referral for cardiology  . Medication Refill    Pt needs rx for xarelto but he cannot afford it. Previous PCP was providing samples.      HPI: Patient is a 84 y.o. male seen in the office today to est care. Previously living in [place] but relocated to be close to family. Last OV in August.  Last physical was in December.   Hypertension- taking amlodipine 10 mg by mouth daily, hydralazine 25 mg by mouth three time daily, losartan 100 mg by mouth daily for blood pressure. Pt reports previous PCP was satisfied. Daughter reports she feels like he was not taking medication correctly before but now that he is blood pressure is better.   A fib- rate controlled- hx of cardiac ablation in 2016, currently on xarelto for anticoagulation.    Hx of aortic regurgitation, mitral regurgitation, aortic stenosis, mitral stenosis followed with cardiologist. Also noted CHF.  Hyperlipidemia- currently on simvastatin 20 mg by mouth daily   Hx of CVA- was on plavix prior to starting xarelto.   DM- took metformin for a while but has not needed it.   OSA- has a CPAP but only uses occasionally.   Review of Systems:  Review of Systems  Constitutional: Positive for malaise/fatigue. Negative for chills and fever.  Respiratory: Negative for cough and shortness of breath.   Cardiovascular: Negative for chest pain, palpitations and leg swelling.  Gastrointestinal: Positive for constipation. Negative for abdominal pain and diarrhea.  Genitourinary:  Positive for urgency.  Musculoskeletal: Positive for back pain and joint pain.       Reports arthritis.   Neurological: Positive for tingling (numbness to right hand since july, had CT scan.).    Past Medical History:  Diagnosis Date  . A-fib ([place])   . Aortic regurgitation   . Aortic stenosis   . CHF (congestive heart failure) ([place])   . Coronary artery disease   . Diabetes mellitus, type 2 ([place])   . Heart attack ([place])   . Hypertension   . Mitral regurgitation   . Mitral stenosis   . Sleep apnea   . Transient ischemic attack    10 to 12 years ago   Past Surgical History:  Procedure Laterality Date  . CARDIAC ELECTROPHYSIOLOGY STUDY AND ABLATION    . CATARACT EXTRACTION    . Debridement to right arm with MRSA infection     4 years ago   Social History:   reports that he has quit smoking. He has never used smokeless tobacco. He reports that he does not drink alcohol or use drugs.  Family History  Problem Relation Age of Onset  . Heart failure Mother   . Hyperlipidemia Mother   . Stroke Father   . Hypertension Son   . Hyperlipidemia Son   . Arthritis Son   . Arthritis Daughter   . Hypertension Son     Medications: Patient's Medications  New Prescriptions   No medications on file  Previous Medications   AMLODIPINE (NORVASC) 10 MG TABLET    Take 10 mg by  mouth daily.   HYDRALAZINE (APRESOLINE) 25 MG TABLET    Take 25 mg by mouth 3 (three) times daily.   LOSARTAN (COZAAR) 100 MG TABLET    Take 100 mg by mouth daily.   RIVAROXABAN (XARELTO) 20 MG TABS TABLET    Take 20 mg by mouth daily with supper.   SIMVASTATIN (ZOCOR) 20 MG TABLET    Take 20 mg by mouth daily.  Modified Medications   No medications on file  Discontinued Medications   No medications on file     Physical Exam:  Vitals:   06/10/18 0901  BP: (!) 142/78  Pulse: 64  Temp: 98 F (36.7 C)  TempSrc: Oral  SpO2: 95%  Weight: 173 lb 3.2 oz (78.6 kg)  Height: 5\' 9"  (1.753 m)   Body mass index  is 25.58 kg/m.  Physical Exam  Constitutional: He is oriented to person, place, and time. He appears well-developed and well-nourished. No distress.  HENT:  Head: Normocephalic and atraumatic.  Mouth/Throat: Oropharynx is clear and moist. No oropharyngeal exudate.  Cerumen impaction bilateral   Eyes: Pupils are equal, round, and reactive to light. Conjunctivae and EOM are normal.  Neck: Normal range of motion. Neck supple.  Cardiovascular: Normal rate, regular rhythm and normal heart sounds.  Pulmonary/Chest: Effort normal and breath sounds normal.  Abdominal: Soft. Bowel sounds are normal.  Musculoskeletal: He exhibits no edema or tenderness.  Neurological: He is alert and oriented to person, place, and time.  Skin: Skin is warm and dry. He is not diaphoretic.  Psychiatric: He has a normal mood and affect.    Labs reviewed: Basic Metabolic Panel: Recent Labs    11/18/17 2000  NA 134*  K 4.2  CL 103  CO2 21*  GLUCOSE 165*  BUN 18  CREATININE 1.04  CALCIUM 8.7*   Liver Function Tests: Recent Labs    11/18/17 2000  AST 25  ALT 22  ALKPHOS 89  BILITOT 0.8  PROT 7.5  ALBUMIN 4.2   No results for input(s): LIPASE, AMYLASE in the last 8760 hours. No results for input(s): AMMONIA in the last 8760 hours. CBC: Recent Labs    11/18/17 2000  WBC 15.0*  NEUTROABS 13.5*  HGB 14.8  HCT 42.8  MCV 89.5  PLT 211   Lipid Panel: No results for input(s): CHOL, HDL, LDLCALC, TRIG, CHOLHDL, LDLDIRECT in the last 8760 hours. TSH: No results for input(s): TSH in the last 8760 hours. A1C: No results found for: HGBA1C   Assessment/Plan 1. Sleep apnea, unspecified type Encouraged to wear CPAP.   2. Essential hypertension -stable, will continue current regimen.  - Ambulatory referral to Cardiology; Future  3. Type 2 diabetes mellitus with diabetic neuropathy, without long-term current use of insulin (HCC) -diet controlled.  - Hemoglobin A1c; Future  4. Paroxysmal  atrial fibrillation (HCC) -s/p ablation, rate controlled. Continues on xarelto 20 mg daily  - Ambulatory referral to Cardiology; Future - CBC with Differential/Platelets; Future  5. Congestive heart failure, unspecified HF chronicity, unspecified heart failure type ([place]) -euvolemic at this time. Will get records and referral to Cardiology for ongoing management.   6. Hyperlipidemia, unspecified hyperlipidemia type Continues on statin. Will follow up fasting blood work prior to next OV.  - COMPLETE METABOLIC PANEL WITH GFR; Future - Lipid Panel; Future  7. Impacted cerumen, bilateral - Ear Lavage, remains impacted, will use debrox and daughter plans to flush ears.   Next appt: 08/07/2018 Carlos American. Harle Battiest  [facility]  [facility] Medicine [phone]

## 2018-06-11 ENCOUNTER — Telehealth: Payer: Self-pay

## 2018-06-11 NOTE — Telephone Encounter (Signed)
SENT REFERRAL TO SCHEDULING AND FILED NOTES 

## 2018-07-04 ENCOUNTER — Ambulatory Visit (INDEPENDENT_AMBULATORY_CARE_PROVIDER_SITE_OTHER): Payer: Medicare Other | Admitting: Cardiovascular Disease

## 2018-07-04 VITALS — BP 162/60 | HR 53 | Ht 69.0 in | Wt 175.0 lb

## 2018-07-04 DIAGNOSIS — E7800 Pure hypercholesterolemia, unspecified: Secondary | ICD-10-CM

## 2018-07-04 DIAGNOSIS — I351 Nonrheumatic aortic (valve) insufficiency: Secondary | ICD-10-CM | POA: Diagnosis not present

## 2018-07-04 DIAGNOSIS — I5189 Other ill-defined heart diseases: Secondary | ICD-10-CM

## 2018-07-04 DIAGNOSIS — R001 Bradycardia, unspecified: Secondary | ICD-10-CM

## 2018-07-04 DIAGNOSIS — Z7901 Long term (current) use of anticoagulants: Secondary | ICD-10-CM | POA: Diagnosis not present

## 2018-07-04 DIAGNOSIS — I77810 Thoracic aortic ectasia: Secondary | ICD-10-CM

## 2018-07-04 DIAGNOSIS — I480 Paroxysmal atrial fibrillation: Secondary | ICD-10-CM

## 2018-07-04 DIAGNOSIS — G4733 Obstructive sleep apnea (adult) (pediatric): Secondary | ICD-10-CM

## 2018-07-04 DIAGNOSIS — I10 Essential (primary) hypertension: Secondary | ICD-10-CM

## 2018-07-04 NOTE — Patient Instructions (Signed)
Medication Instructions:  Dr Sallyanne Kuster recommends that you continue on your current medications as directed. Please refer to the Current Medication list given to you today.  If you need a refill on your cardiac medications before your next appointment, please call your pharmacy.   Testing/Procedures: Your physician has requested that you have an echocardiogram. Echocardiography is a painless test that uses sound waves to create images of your heart. It provides your doctor with information about the size and shape of your heart and how well your heart's chambers and valves are working. This procedure takes approximately one hour. There are no restrictions for this procedure.  >> This will be performed at our [facility] location [place], [place] [address] [phone]  Follow-Up: Dr Sallyanne Kuster recommends that you schedule a follow-up appointment in 12 months. You will receive a reminder letter in the mail two months in advance. If you don't receive a letter, please call our office to schedule the follow-up appointment.
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
## 2018-07-07 ENCOUNTER — Encounter: Payer: Self-pay | Admitting: Cardiovascular Disease

## 2018-07-07 DIAGNOSIS — I77810 Thoracic aortic ectasia: Secondary | ICD-10-CM | POA: Insufficient documentation

## 2018-07-07 DIAGNOSIS — I5189 Other ill-defined heart diseases: Secondary | ICD-10-CM | POA: Insufficient documentation

## 2018-07-07 DIAGNOSIS — R001 Bradycardia, unspecified: Secondary | ICD-10-CM | POA: Insufficient documentation

## 2018-07-07 DIAGNOSIS — E7800 Pure hypercholesterolemia, unspecified: Secondary | ICD-10-CM | POA: Insufficient documentation

## 2018-07-07 DIAGNOSIS — I351 Nonrheumatic aortic (valve) insufficiency: Secondary | ICD-10-CM | POA: Insufficient documentation

## 2018-07-07 DIAGNOSIS — Z7901 Long term (current) use of anticoagulants: Secondary | ICD-10-CM | POA: Insufficient documentation

## 2018-07-10 ENCOUNTER — Ambulatory Visit (HOSPITAL_COMMUNITY): Payer: Medicare Other | Attending: Cardiology

## 2018-07-10 ENCOUNTER — Other Ambulatory Visit: Payer: Self-pay

## 2018-07-10 DIAGNOSIS — I351 Nonrheumatic aortic (valve) insufficiency: Secondary | ICD-10-CM

## 2018-07-16 ENCOUNTER — Telehealth: Payer: Self-pay | Admitting: *Deleted

## 2018-07-16 NOTE — Telephone Encounter (Signed)
   [facility] Pre-operative Risk Assessment    Request for surgical clearance:  1. What type of surgery is being performed? BILATERAL UPPER EYELID BLEPHAROPTOSIS REPAIR WITH BILATERAL UPPER EYELID BLEPHAROPLASTY WITH BILATERAL DIRECT BROW LIFT     2. When is this surgery scheduled? 07/22/18   3. What type of clearance is required (medical clearance vs. Pharmacy clearance to hold med vs. Both)? BOTH  4. Are there any medications that need to be held prior to surgery and how long?[place]   5. Practice name and name of physician performing surgery? [facility]    6. What is your office phone number? [phone]     7.   What is your office fax number? [phone]   8.   Anesthesia type (None, local, MAC, general) ? MAC

## 2018-07-17 NOTE — Telephone Encounter (Signed)
Pt takes Xarelto for afib with CHADS2VASc score of 8 (age x2, CHF, HTN, DM, CAD, TIA). Renal function is normal. Recommend only holding Xarelto for 24 hours prior to procedure due to elevated cardiac risk.

## 2018-07-17 NOTE — Telephone Encounter (Signed)
I have reviewed his records from his previous physician and I think several of the diagnoses that are listed in his chart are not supported.  There is no true evidence that he has coronary artery disease, CHF and the history of TIA is also doubtful.  I would categorize his risk as moderate, CHADSVasc 4.  He can hold his Xarelto for 48 hours before the procedure.  This is a low risk procedure and further cardiac evaluation is not necessary.

## 2018-07-17 NOTE — Telephone Encounter (Signed)
   Primary Cardiologist: [name], MD  Chart reviewed as part of pre-operative protocol coverage.   Patient was just seen by Dr. Sallyanne Kuster 07/04/18, will ask him to weigh in on clearance - seems like low risk procedure under MAC. Dr. Sallyanne Kuster - Please route response to P CV DIV PREOP (the pre-op pool). Thank you.  Will route to pharmacy for input on Xarelto then plan to bundle recs.  Charlie Pitter, PA-C 07/17/2018, 2:40 PM

## 2018-08-07 ENCOUNTER — Other Ambulatory Visit: Payer: Medicare HMO

## 2018-08-07 DIAGNOSIS — I480 Paroxysmal atrial fibrillation: Secondary | ICD-10-CM

## 2018-08-07 DIAGNOSIS — E785 Hyperlipidemia, unspecified: Secondary | ICD-10-CM | POA: Diagnosis not present

## 2018-08-07 DIAGNOSIS — E1140 Type 2 diabetes mellitus with diabetic neuropathy, unspecified: Secondary | ICD-10-CM

## 2018-08-08 LAB — COMPLETE METABOLIC PANEL WITH GFR
AG Ratio: 1.4 (calc) (ref 1.0–2.5)
ALT: 22 U/L (ref 9–46)
AST: 16 U/L (ref 10–35)
Albumin: 4 g/dL (ref 3.6–5.1)
Alkaline phosphatase (APISO): 92 U/L (ref 40–115)
BUN/Creatinine Ratio: 26 (calc) — ABNORMAL HIGH (ref 6–22)
BUN: 28 mg/dL — ABNORMAL HIGH (ref 7–25)
CO2: 28 mmol/L (ref 20–32)
CREATININE: 1.09 mg/dL (ref 0.70–1.11)
Calcium: 9 mg/dL (ref 8.6–10.3)
Chloride: 108 mmol/L (ref 98–110)
GFR, EST AFRICAN AMERICAN: 73 mL/min/{1.73_m2} (ref >=60)
GFR, EST NON AFRICAN AMERICAN: 63 mL/min/{1.73_m2} (ref >=60)
Globulin: 2.9 g/dL (calc) (ref 1.9–3.7)
Glucose, Bld: 102 mg/dL — ABNORMAL HIGH (ref 65–99)
Potassium: 4.5 mmol/L (ref 3.5–5.3)
Sodium: 144 mmol/L (ref 135–146)
TOTAL PROTEIN: 6.9 g/dL (ref 6.1–8.1)
Total Bilirubin: 0.5 mg/dL (ref 0.2–1.2)

## 2018-08-08 LAB — CBC WITH DIFFERENTIAL/PLATELET
Absolute Monocytes: 504 cells/uL (ref 200–950)
Basophils Absolute: 7 cells/uL (ref 0–200)
Basophils Relative: 0.1 %
Eosinophils Absolute: 283 cells/uL (ref 15–500)
Eosinophils Relative: 4.1 %
HCT: 43.2 % (ref 38.5–50.0)
Hemoglobin: 14.7 g/dL (ref 13.2–17.1)
Lymphs Abs: 2187 cells/uL (ref 850–3900)
MCH: 31.4 pg (ref 27.0–33.0)
MCHC: 34 g/dL (ref 32.0–36.0)
MCV: 92.3 fL (ref 80.0–100.0)
MPV: 10.6 fL (ref 7.5–12.5)
Monocytes Relative: 7.3 %
Neutro Abs: 3919 cells/uL (ref 1500–7800)
Neutrophils Relative %: 56.8 %
Platelets: 224 10*3/uL (ref 140–400)
RBC: 4.68 10*6/uL (ref 4.20–5.80)
RDW: 12.4 % (ref 11.0–15.0)
TOTAL LYMPHOCYTE: 31.7 %
WBC: 6.9 10*3/uL (ref 3.8–10.8)

## 2018-08-08 LAB — LIPID PANEL
Cholesterol: 150 mg/dL (ref <200)
HDL: 35 mg/dL — ABNORMAL LOW (ref >40)
LDL Cholesterol (Calc): 96 mg/dL (calc)
Non-HDL Cholesterol (Calc): 115 mg/dL (calc) (ref <130)
Total CHOL/HDL Ratio: 4.3 (calc) (ref <5.0)
Triglycerides: 94 mg/dL (ref <150)

## 2018-08-08 LAB — HEMOGLOBIN A1C
Hgb A1c MFr Bld: 6.1 % of total Hgb — ABNORMAL HIGH (ref <5.7)
Mean Plasma Glucose: 128 (calc)
eAG (mmol/L): 7.1 (calc)

## 2018-08-12 ENCOUNTER — Encounter: Payer: Self-pay | Admitting: Nurse Practitioner

## 2018-08-12 ENCOUNTER — Ambulatory Visit (INDEPENDENT_AMBULATORY_CARE_PROVIDER_SITE_OTHER): Payer: Medicare HMO | Admitting: Nurse Practitioner

## 2018-08-12 ENCOUNTER — Ambulatory Visit: Payer: Medicare Other

## 2018-08-12 VITALS — BP 132/60 | HR 57 | Ht 69.0 in | Wt 172.0 lb

## 2018-08-12 VITALS — BP 132/60 | HR 57 | Ht 69.0 in | Wt 172.8 lb

## 2018-08-12 DIAGNOSIS — R350 Frequency of micturition: Secondary | ICD-10-CM

## 2018-08-12 DIAGNOSIS — E785 Hyperlipidemia, unspecified: Secondary | ICD-10-CM

## 2018-08-12 DIAGNOSIS — R413 Other amnesia: Secondary | ICD-10-CM | POA: Diagnosis not present

## 2018-08-12 DIAGNOSIS — E1140 Type 2 diabetes mellitus with diabetic neuropathy, unspecified: Secondary | ICD-10-CM

## 2018-08-12 DIAGNOSIS — I10 Essential (primary) hypertension: Secondary | ICD-10-CM | POA: Diagnosis not present

## 2018-08-12 DIAGNOSIS — I480 Paroxysmal atrial fibrillation: Secondary | ICD-10-CM | POA: Diagnosis not present

## 2018-08-12 DIAGNOSIS — Z0000 Encounter for general adult medical examination without abnormal findings: Secondary | ICD-10-CM

## 2018-08-12 DIAGNOSIS — R410 Disorientation, unspecified: Secondary | ICD-10-CM | POA: Diagnosis not present

## 2018-08-12 DIAGNOSIS — B372 Candidiasis of skin and nail: Secondary | ICD-10-CM

## 2018-08-12 DIAGNOSIS — H6123 Impacted cerumen, bilateral: Secondary | ICD-10-CM | POA: Diagnosis not present

## 2018-08-12 LAB — POCT URINALYSIS DIPSTICK
Blood, UA: NEGATIVE
Glucose, UA: NEGATIVE
Ketones, UA: NEGATIVE
Leukocytes, UA: NEGATIVE
Nitrite, UA: NEGATIVE
PH UA: 5 (ref 5.0–8.0)
Protein, UA: POSITIVE — AB
Urobilinogen, UA: NEGATIVE E.U./dL — AB

## 2018-08-12 MED ORDER — NYSTATIN 100000 UNIT/GM EX POWD: Freq: Three times a day (TID) | CUTANEOUS | 0 refills | Status: DC

## 2018-08-12 MED ORDER — HYDRALAZINE HCL 25 MG PO TABS: 25.0000 mg | ORAL_TABLET | Freq: Three times a day (TID) | ORAL | 2 refills | Status: DC

## 2018-08-12 MED ORDER — LOSARTAN POTASSIUM 100 MG PO TABS: 100.0000 mg | ORAL_TABLET | Freq: Every day | ORAL | 0 refills | Status: DC

## 2018-08-12 MED ORDER — RIVAROXABAN 20 MG PO TABS: 20.0000 mg | ORAL_TABLET | Freq: Every day | ORAL | 1 refills | Status: DC

## 2018-08-12 MED ORDER — ZOSTER VAC RECOMB ADJUVANTED 50 MCG/0.5ML IM SUSR: 0.5000 mL | Freq: Once | INTRAMUSCULAR | 0 refills | Status: AC

## 2018-08-12 MED ORDER — AMLODIPINE BESYLATE 10 MG PO TABS: 10.0000 mg | ORAL_TABLET | Freq: Every day | ORAL | 1 refills | Status: DC

## 2018-08-12 NOTE — Progress Notes (Signed)
`   Subjective:   Jose Patel is a 85 y.o. male who presents for Medicare Annual/Subsequent preventive examination.  Review of Systems:  Cardiac Risk Factors include: advanced age (>57men, >34 women);obesity (BMI >30kg/m2);sedentary lifestyle;male gender     Objective:    Vitals: BP 132/60   Pulse (!) 57   Ht 5\' 9"  (1.753 m)   Wt 172 lb 12.8 oz (78.4 kg)   SpO2 98%   BMI 25.52 kg/m   Body mass index is 25.52 kg/m.  Advanced Directives 08/12/2018 08/12/2018 06/10/2018 11/18/2017  Does Patient Have a Medical Advance Directive? Yes No Yes Yes  Type of Advance Directive - - [place];Living will [place];Living will  Does patient want to make changes to medical advance directive? No - Patient declined No - Patient declined - -  Copy of [place] in Chart? - - Yes - validated most recent copy scanned in chart (See row information) -    Tobacco Social History   Tobacco Use  Smoking Status Former Smoker  Smokeless Tobacco Never Used     Counseling given: Not Answered   Clinical Intake:  Pre-visit preparation completed: Yes  Pain : No/denies pain     BMI - recorded: 25.52 Nutritional Status: BMI 25 -29 Overweight Diabetes: No  How often do you need to have someone help you when you read instructions, pamphlets, or other written materials from your doctor or pharmacy?: 3 - Sometimes What is the last grade level you completed in school?: 12 years plus trade school   Interpreter Needed?: No     Past Medical History:  Diagnosis Date  . A-fib ([place])   . Aortic regurgitation   . Aortic stenosis   . CHF (congestive heart failure) ([place])   . Coronary artery disease   . Diabetes mellitus, type 2 ([place])   . Heart attack ([place])   . Hypertension   . Mitral regurgitation   . Mitral stenosis   . Sleep apnea   . Transient ischemic attack    10 to 12 years ago   Past Surgical History:  Procedure Laterality Date  .  CARDIAC ELECTROPHYSIOLOGY STUDY AND ABLATION    . CATARACT EXTRACTION    . Debridement to right arm with MRSA infection     4 years ago   Family History  Problem Relation Age of Onset  . Heart failure Mother   . Hyperlipidemia Mother   . Hypertension Son   . Hyperlipidemia Son   . Arthritis Son   . Arthritis Daughter   . Hypertension Son    Social History   Socioeconomic History  . Marital status: Married    Spouse name: Not on file  . Number of children: Not on file  . Years of education: Not on file  . Highest education level: Not on file  Occupational History  . Not on file  Social Needs  . Financial resource strain: Not on file  . Food insecurity:    Worry: Not on file    Inability: Not on file  . Transportation needs:    Medical: Not on file    Non-medical: Not on file  Tobacco Use  . Smoking status: Former Research scientist (life sciences)  . Smokeless tobacco: Never Used  Substance and Sexual Activity  . Alcohol use: Never    Frequency: Never  . Drug use: Never  . Sexual activity: Not Currently  Lifestyle  . Physical activity:    Days per week: Not  on file    Minutes per session: Not on file  . Stress: Not on file  Relationships  . Social connections:    Talks on phone: Not on file    Gets together: Not on file    Attends religious service: Not on file    Active member of club or organization: Not on file    Attends meetings of clubs or organizations: Not on file    Relationship status: Not on file  Other Topics Concern  . Not on file  Social History Narrative   Social History      Diet?       Do you drink/eat things with caffeine? yes      Marital status?          married                          What year were you married? 1957      Do you live in a house, apartment, assisted living, condo, trailer, etc.? home      Is it one or more stories? 1      How many persons live in your home? 4      Do you have any pets in your home? (please list) yes- 3 dogs, 1 cat       Highest level of education completed? 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           no                           Type & how often?      Advanced Directives      Do you have a living will? yes      Do you have a DNR form?                                  If not, do you want to discuss one? no      Do you have signed POA/HPOA for forms? yes      Functional Status      Do you have difficulty bathing or dressing yourself? no      Do you have difficulty preparing food or eating? no      Do you have difficulty managing your medications? no      Do you have difficulty managing your finances? no      Do you have difficulty affording your medications? Yes xarelto    Outpatient Encounter Medications as of 08/12/2018  Medication Sig  . amLODipine (NORVASC) 10 MG tablet Take 10 mg by mouth daily.  . hydrALAZINE (APRESOLINE) 25 MG tablet Take 25 mg by mouth 3 (three) times daily.  Marland Kitchen losartan (COZAAR) 100 MG tablet Take 100 mg by mouth daily.  . rivaroxaban (XARELTO) 20 MG TABS tablet Take 20 mg by mouth daily with supper.  . simvastatin (ZOCOR) 20 MG tablet Take 20 mg by mouth daily.   No facility-administered encounter medications on file as of 08/12/2018.     Activities of Daily Living In your present state of health, do you have any difficulty performing the following activities: 08/12/2018  Hearing? Y  Comment "ears stopped up"  Vision? N  Difficulty concentrating or making  decisions? Y  Walking or climbing stairs? N  Dressing or bathing? N  Doing errands, shopping? Y  Preparing Food and eating ? N  Using the Toilet? N  In the past six months, have you accidently leaked urine? Y  Do you have problems with loss of bowel control? N  Managing your Medications? Y  Managing your Finances? Y  Housekeeping or managing your Housekeeping? Y  Some recent data might be hidden    Patient Care Team: [name],  NP as PCP - General (Geriatric Medicine) [name], MD as PCP - Cardiology (Cardiology) Center, [facility] (Gastroenterology)   Assessment:   This is a routine wellness examination for [facility].  Exercise Activities and Dietary recommendations Current Exercise Habits: Structured exercise class, Time (Minutes): 10, Frequency (Times/Week): 1, Weekly Exercise (Minutes/Week): 10, Intensity: Mild, Exercise limited by: None identified  Goals    . DIET - INCREASE WATER INTAKE     Increase water intake and decrease soda     . Increase physical activity     To increase physical activity to 30 minutes 3 days a week        Fall Risk Fall Risk  08/12/2018 08/12/2018 06/10/2018  Falls in the past year? 0 0 0  Number falls in past yr: 0 0 -  Injury with Fall? 0 0 -   Is the patient's home free of loose throw rugs in walkways, pet beds, electrical cords, etc?   no      Grab bars in the bathroom? yes      Handrails on the stairs?   yes      Adequate lighting?   no   Depression Screen PHQ 2/9 Scores 08/12/2018 06/10/2018  PHQ - 2 Score 0 0    Cognitive Function MMSE - Mini Mental State Exam 08/12/2018  Orientation to time 4  Orientation to Place 3  Registration 3  Attention/ Calculation 1  Recall 1  Language- name 2 objects 2  Language- repeat 1  Language- follow 3 step command 3  Language- read & follow direction 0  Write a sentence 0  Copy design 0  Total score 18        Immunization History  Administered Date(s) Administered  . Influenza, High Dose Seasonal PF 05/08/2018    Qualifies for Shingles Vaccine? yes  Screening Tests Health Maintenance  Topic Date Due  . FOOT EXAM  10/27/1943  . OPHTHALMOLOGY EXAM  10/27/1945  . TETANUS/TDAP  10/28/1954  . PNA vac Low Risk Adult (1 of 2 - PCV13) 10/27/2000  . HEMOGLOBIN A1C  02/05/2019  . INFLUENZA VACCINE  Completed   Cancer Screenings: Lung: Low Dose CT Chest recommended if Age 62-80 years, 30 pack-year  currently smoking OR have quit w/in 15years. Patient does not qualify. Colorectal: last colonoscopy 3 years, done with screening.   Additional Screenings: Hepatitis C Screening: no. Low risk lifestyle.   waiting on records for immunizations.     Plan:     I have personally reviewed and noted the following in the patient's chart:   . Medical and social history . Use of alcohol, tobacco or illicit drugs  . Current medications and supplements . Functional ability and status . Nutritional status . Physical activity . Advanced directives . List of other physicians . Hospitalizations, surgeries, and ER visits in previous 12 months . Vitals . Screenings to include cognitive, depression, and falls . Referrals and appointments  In addition, I have reviewed and discussed with patient  certain preventive protocols, quality metrics, and best practice recommendations. A written personalized care plan for preventive services as well as general preventive health recommendations were provided to patient.     [name], NP  08/12/2018

## 2018-08-12 NOTE — Patient Instructions (Signed)
[name] , Thank you for taking time to come for your Medicare Wellness Visit. I appreciate your ongoing commitment to your health goals. Please review the following plan we discussed and let me know if I can assist you in the future.   Screening recommendations/referrals: Colonoscopy up to date Recommended yearly ophthalmology/optometry visit for glaucoma screening and checkup Recommended yearly dental visit for hygiene and checkup  Vaccinations: Influenza vaccine up to date Pneumococcal vaccine up to date- will follow up records  Tdap vaccine up to date- will follow up records Shingles vaccine - Rx printed.    Advanced directives: to complete and bring to office visit  Conditions/risks identified: memory loss   Next appointment: 1 year for AWV with EV after   Preventive Care 20 Years and Older, Male Preventive care refers to lifestyle choices and visits with your health care provider that can promote health and wellness. What does preventive care include?  A yearly physical exam. This is also called an annual well check.  Dental exams once or twice a year.  Routine eye exams. Ask your health care provider how often you should have your eyes checked.  Personal lifestyle choices, including:  Daily care of your teeth and gums.  Regular physical activity.  Eating a healthy diet.  Avoiding tobacco and drug use.  Limiting alcohol use.  Practicing safe sex.  Taking low doses of aspirin every day.  Taking vitamin and mineral supplements as recommended by your health care provider. What happens during an annual well check? The services and screenings done by your health care provider during your annual well check will depend on your age, overall health, lifestyle risk factors, and family history of disease. Counseling  Your health care provider may ask you questions about your:  Alcohol use.  Tobacco use.  Drug use.  Emotional well-being.  Home and relationship  well-being.  Sexual activity.  Eating habits.  History of falls.  Memory and ability to understand (cognition).  Work and work Statistician. Screening  You may have the following tests or measurements:  Height, weight, and BMI.  Blood pressure.  Lipid and cholesterol levels. These may be checked every 5 years, or more frequently if you are over 42 years old.  Skin check.  Lung cancer screening. You may have this screening every year starting at age 48 if you have a 30-pack-year history of smoking and currently smoke or have quit within the past 15 years.  Fecal occult blood test (FOBT) of the stool. You may have this test every year starting at age 59.  Flexible sigmoidoscopy or colonoscopy. You may have a sigmoidoscopy every 5 years or a colonoscopy every 10 years starting at age 32.  Prostate cancer screening. Recommendations will vary depending on your family history and other risks.  Hepatitis C blood test.  Hepatitis B blood test.  Sexually transmitted disease (STD) testing.  Diabetes screening. This is done by checking your blood sugar (glucose) after you have not eaten for a while (fasting). You may have this done every 1-3 years.  Abdominal aortic aneurysm (AAA) screening. You may need this if you are a current or former smoker.  Osteoporosis. You may be screened starting at age 7 if you are at high risk. Talk with your health care provider about your test results, treatment options, and if necessary, the need for more tests. Vaccines  Your health care provider may recommend certain vaccines, such as:  Influenza vaccine. This is recommended every year.  Tetanus,  diphtheria, and acellular pertussis (Tdap, Td) vaccine. You may need a Td booster every 10 years.  Zoster vaccine. You may need this after age 68.  Pneumococcal 13-valent conjugate (PCV13) vaccine. One dose is recommended after age 74.  Pneumococcal polysaccharide (PPSV23) vaccine. One dose is  recommended after age 51. Talk to your health care provider about which screenings and vaccines you need and how often you need them. This information is not intended to replace advice given to you by your health care provider. Make sure you discuss any questions you have with your health care provider. Document Released: 08/13/2015 Document Revised: 04/05/2016 Document Reviewed: 05/18/2015 Elsevier Interactive Patient Education  2017 [employer] Prevention in the Home Falls can cause injuries. They can happen to people of all ages. There are many things you can do to make your home safe and to help prevent falls. What can I do on the outside of my home?  Regularly fix the edges of walkways and driveways and fix any cracks.  Remove anything that might make you trip as you walk through a door, such as a raised step or threshold.  Trim any bushes or trees on the path to your home.  Use bright outdoor lighting.  Clear any walking paths of anything that might make someone trip, such as rocks or tools.  Regularly check to see if handrails are loose or broken. Make sure that both sides of any steps have handrails.  Any raised decks and porches should have guardrails on the edges.  Have any leaves, snow, or ice cleared regularly.  Use sand or salt on walking paths during winter.  Clean up any spills in your garage right away. This includes oil or grease spills. What can I do in the bathroom?  Use night lights.  Install grab bars by the toilet and in the tub and shower. Do not use towel bars as grab bars.  Use non-skid mats or decals in the tub or shower.  If you need to sit down in the shower, use a plastic, non-slip stool.  Keep the floor dry. Clean up any water that spills on the floor as soon as it happens.  Remove soap buildup in the tub or shower regularly.  Attach bath mats securely with double-sided non-slip rug tape.  Do not have throw rugs and other things on  the floor that can make you trip. What can I do in the bedroom?  Use night lights.  Make sure that you have a light by your bed that is easy to reach.  Do not use any sheets or blankets that are too big for your bed. They should not hang down onto the floor.  Have a firm chair that has side arms. You can use this for support while you get dressed.  Do not have throw rugs and other things on the floor that can make you trip. What can I do in the kitchen?  Clean up any spills right away.  Avoid walking on wet floors.  Keep items that you use a lot in easy-to-reach places.  If you need to reach something above you, use a strong step stool that has a grab bar.  Keep electrical cords out of the way.  Do not use floor polish or wax that makes floors slippery. If you must use wax, use non-skid floor wax.  Do not have throw rugs and other things on the floor that can make you trip. What can I do with  my stairs?  Do not leave any items on the stairs.  Make sure that there are handrails on both sides of the stairs and use them. Fix handrails that are broken or loose. Make sure that handrails are as long as the stairways.  Check any carpeting to make sure that it is firmly attached to the stairs. Fix any carpet that is loose or worn.  Avoid having throw rugs at the top or bottom of the stairs. If you do have throw rugs, attach them to the floor with carpet tape.  Make sure that you have a light switch at the top of the stairs and the bottom of the stairs. If you do not have them, ask someone to add them for you. What else can I do to help prevent falls?  Wear shoes that:  Do not have high heels.  Have rubber bottoms.  Are comfortable and fit you well.  Are closed at the toe. Do not wear sandals.  If you use a stepladder:  Make sure that it is fully opened. Do not climb a closed stepladder.  Make sure that both sides of the stepladder are locked into place.  Ask someone to  hold it for you, if possible.  Clearly mark and make sure that you can see:  Any grab bars or handrails.  First and last steps.  Where the edge of each step is.  Use tools that help you move around (mobility aids) if they are needed. These include:  Canes.  Walkers.  Scooters.  Crutches.  Turn on the lights when you go into a dark area. Replace any light bulbs as soon as they burn out.  Set up your furniture so you have a clear path. Avoid moving your furniture around.  If any of your floors are uneven, fix them.  If there are any pets around you, be aware of where they are.  Review your medicines with your doctor. Some medicines can make you feel dizzy. This can increase your chance of falling. Ask your doctor what other things that you can do to help prevent falls. This information is not intended to replace advice given to you by your health care provider. Make sure you discuss any questions you have with your health care provider. Document Released: 05/13/2009 Document Revised: 12/23/2015 Document Reviewed: 08/21/2014 Elsevier Interactive Patient Education  2017 [employer].

## 2018-08-12 NOTE — Addendum Note (Signed)
Addended by: Rafael Bihari A on: 08/12/2018 12:52 PM   Modules accepted: Orders

## 2018-08-12 NOTE — Progress Notes (Signed)
Provider: [name], NP  Patient Care Team: [name], NP as PCP - General (Geriatric Medicine) [name], MD as PCP - Cardiology (Cardiology) Center, [facility] (Gastroenterology)  Extended Emergency Contact Information Primary Emergency Contact: Henderson Cloud Mobile Phone: [phone] Relation: Daughter No Known Allergies Code Status: FULL Goals of Care: Advanced Directive information Advanced Directives 08/12/2018  Does Patient Have a Medical Advance Directive? Yes  Type of Advance Directive -  Does patient want to make changes to medical advance directive? No - Patient declined  Copy of [place] in Chart? -     Chief Complaint  Patient presents with  . extended visit    due for tdap vaccine    HPI: Patient is a 85 y.o. male seen in today for an annual wellness exam.    Diet? No restrictions  Exercise?limited     Dentition:every 6 months.   Ophthalmology appt: up to date, yearly. Recently had a Blepharoplasty, tacked up/lifted forehead.   Routine specialist: following with cardiology Had been following with neurologist in the past but no longer follows with one.   Hx of afib- continues on   DM- diet controlled, A1c of 6.1  Hyperlipidemia- LDL 96, on simvastatin, daughter would like to stop STATIN and place him on niacin based on her research with memory loss  Depression screen [facility] 2/9 08/12/2018 06/10/2018  Decreased Interest 0 0  Down, Depressed, Hopeless 0 0  PHQ - 2 Score 0 0    Fall Risk  08/12/2018 08/12/2018 06/10/2018  Falls in the past year? 0 0 0  Number falls in past yr: 0 0 -  Injury with Fall? 0 0 -   MMSE - [place] Exam 08/12/2018  Orientation to time 4  Orientation to Place 3  Registration 3  Attention/ Calculation 1  Recall 1  Language- name 2 objects 2  Language- repeat 1  Language- follow 3 step command 3  Language- read & follow direction 0  Write a sentence 0  Copy  design 0  Total score 18     Health Maintenance  Topic Date Due  . FOOT EXAM  10/27/1943  . OPHTHALMOLOGY EXAM  10/27/1943  . TETANUS/TDAP  10/28/1954  . PNA vac Low Risk Adult (1 of 2 - PCV13) 10/27/2000  . HEMOGLOBIN A1C  02/05/2019  . INFLUENZA VACCINE  Completed     Past Medical History:  Diagnosis Date  . A-fib ([place])   . Aortic regurgitation   . Aortic stenosis   . CHF (congestive heart failure) ([place])   . Coronary artery disease   . Diabetes mellitus, type 2 ([place])   . Heart attack ([place])   . Hypertension   . Mitral regurgitation   . Mitral stenosis   . Sleep apnea   . Transient ischemic attack    10 to 12 years ago    Past Surgical History:  Procedure Laterality Date  . CARDIAC ELECTROPHYSIOLOGY STUDY AND ABLATION    . CATARACT EXTRACTION    . Debridement to right arm with MRSA infection     4 years ago    Social History   Socioeconomic History  . Marital status: Married    Spouse name: Not on file  . Number of children: Not on file  . Years of education: Not on file  . Highest education level: Not on file  Occupational History  . Not on file  Social Needs  . Financial resource strain: Not on file  .  Food insecurity:    Worry: Not on file    Inability: Not on file  . Transportation needs:    Medical: Not on file    Non-medical: Not on file  Tobacco Use  . Smoking status: Former Research scientist (life sciences)  . Smokeless tobacco: Never Used  Substance and Sexual Activity  . Alcohol use: Never    Frequency: Never  . Drug use: Never  . Sexual activity: Not Currently  Lifestyle  . Physical activity:    Days per week: Not on file    Minutes per session: Not on file  . Stress: Not on file  Relationships  . Social connections:    Talks on phone: Not on file    Gets together: Not on file    Attends religious service: Not on file    Active member of club or organization: Not on file    Attends meetings of clubs or organizations: Not on file    Relationship status:  Not on file  Other Topics Concern  . Not on file  Social History Narrative   Social History      Diet?       Do you drink/eat things with caffeine? yes      Marital status?          married                          What year were you married? 1957      Do you live in a house, apartment, assisted living, condo, trailer, etc.? home      Is it one or more stories? 1      How many persons live in your home? 4      Do you have any pets in your home? (please list) yes- 3 dogs, 1 cat      Highest level of education completed? 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           no                           Type & how often?      Advanced Directives      Do you have a living will? yes      Do you have a DNR form?                                  If not, do you want to discuss one? no      Do you have signed POA/HPOA for forms? yes      Functional Status      Do you have difficulty bathing or dressing yourself? no      Do you have difficulty preparing food or eating? no      Do you have difficulty managing your medications? no      Do you have difficulty managing your finances? no      Do you have difficulty affording your medications? Yes xarelto    Family History  Problem Relation Age of Onset  . Heart failure Mother   . Hyperlipidemia Mother   . Hypertension Son   . Hyperlipidemia Son   . Arthritis Son   . Arthritis Daughter   . Hypertension Son  Review of Systems:  Review of Systems  Constitutional: Positive for malaise/fatigue. Negative for chills and fever.  Respiratory: Negative for cough and shortness of breath.   Cardiovascular: Negative for chest pain, palpitations and leg swelling.  Gastrointestinal: Positive for constipation. Negative for abdominal pain and diarrhea.  Genitourinary: Positive for urgency.  Musculoskeletal: Positive for back pain and joint pain.       Reports arthritis.     Neurological: Positive for tingling (numbness to right hand since july, had CT scan- unchanged.).     Allergies as of 08/12/2018   No Known Allergies     Medication List       Accurate as of August 12, 2018 11:32 AM. Always use your most recent med list.        amLODipine 10 MG tablet Commonly known as:  NORVASC Take 10 mg by mouth daily.   hydrALAZINE 25 MG tablet Commonly known as:  APRESOLINE Take 25 mg by mouth 3 (three) times daily.   losartan 100 MG tablet Commonly known as:  COZAAR Take 100 mg by mouth daily.   rivaroxaban 20 MG Tabs tablet Commonly known as:  XARELTO Take 20 mg by mouth daily with supper.   simvastatin 20 MG tablet Commonly known as:  ZOCOR Take 20 mg by mouth daily.         Physical Exam: Vitals:   08/12/18 1103  BP: 132/60  Pulse: (!) 57  SpO2: 98%  Weight: 172 lb (78 kg)  Height: 5\' 9"  (1.753 m)   Body mass index is 25.4 kg/m. Physical Exam Constitutional:      General: He is not in acute distress.    Appearance: He is well-developed. He is not diaphoretic.  HENT:     Head: Normocephalic and atraumatic.     Mouth/Throat:     Pharynx: No oropharyngeal exudate.  Eyes:     Conjunctiva/sclera: Conjunctivae normal.     Pupils: Pupils are equal, round, and reactive to light.  Neck:     Musculoskeletal: Normal range of motion and neck supple.  Cardiovascular:     Rate and Rhythm: Normal rate and regular rhythm.     Heart sounds: Normal heart sounds.  Pulmonary:     Effort: Pulmonary effort is normal.     Breath sounds: Normal breath sounds.  Abdominal:     General: Bowel sounds are normal.     Palpations: Abdomen is soft.  Musculoskeletal:        General: No tenderness.  Skin:    General: Skin is warm and dry.     Findings: Rash (yeast to right groin) present.  Neurological:     Mental Status: He is alert and oriented to person, place, and time.     Labs reviewed: Basic Metabolic Panel: Recent Labs     11/18/17 2000 08/07/18 1015  NA 134* 144  K 4.2 4.5  CL 103 108  CO2 21* 28  GLUCOSE 165* 102*  BUN 18 28*  CREATININE 1.04 1.09  CALCIUM 8.7* 9.0   Liver Function Tests: Recent Labs    11/18/17 2000 08/07/18 1015  AST 25 16  ALT 22 22  ALKPHOS 89  --   BILITOT 0.8 0.5  PROT 7.5 6.9  ALBUMIN 4.2  --    No results for input(s): LIPASE, AMYLASE in the last 8760 hours. No results for input(s): AMMONIA in the last 8760 hours. CBC: Recent Labs    11/18/17 2000 08/07/18 1015  WBC 15.0* 6.9  NEUTROABS  13.5* 3,919  HGB 14.8 14.7  HCT 42.8 43.2  MCV 89.5 92.3  PLT 211 224   Lipid Panel: Recent Labs    08/07/18 1015  CHOL 150  HDL 35*  LDLCALC 96  TRIG 94  CHOLHDL 4.3   Lab Results  Component Value Date   HGBA1C 6.1 (H) 08/07/2018    Procedures: No results found.  Assessment/Plan 1. Urinary frequency -UA with trace protein but without leukocytes, blood or nitrites  - PSA  2. Memory loss -daughter reports worsening memory in the last few months. MMSE 18/30, CT of head done in July 2019 showing chronic changes small vessel disease.  - B12 and Folate Panel - TSH - Vitamin D, 25-hydroxy  3. Impacted cerumen, bilateral Ear lavage done bilateral, mostly clear however deep impaction remained to right ear. To use debrox twice daily for 4 days and then to make a return appt for lavage.   4. Essential hypertension Stable on current regimen.  - amLODipine (NORVASC) 10 MG tablet; Take 1 tablet (10 mg total) by mouth daily.  Dispense: 30 tablet; Refill: 1 - hydrALAZINE (APRESOLINE) 25 MG tablet; Take 1 tablet (25 mg total) by mouth 3 (three) times daily.  Dispense: 30 tablet; Refill: 2 - losartan (COZAAR) 100 MG tablet; Take 1 tablet (100 mg total) by mouth daily.  Dispense: 30 tablet; Refill: 0  5. Type 2 diabetes mellitus with diabetic neuropathy, without long-term current use of insulin (HCC) -diet controlled, diet modifications - Hemoglobin A1c; Future  6.  Paroxysmal atrial fibrillation (HCC) Reg rate today, continue on xarelto for anticoagulation  - rivaroxaban (XARELTO) 20 MG TABS tablet; Take 1 tablet (20 mg total) by mouth daily with supper.  Dispense: 30 tablet; Refill: 1  7. Hyperlipidemia, unspecified hyperlipidemia type Daughter would like him to stop statin due to memory loss, has niacin at home and would like him to use this as supplement with diet modification.  -plans to do this for 3 months and follow up lipid at this time.  - Lipid Panel; Future  8. Yeast dermatitis - nystatin (MYCOSTATIN/NYSTOP) powder; Apply topically 3 (three) times daily.  Dispense: 15 g; Refill: 0   To sign record release for immunizations as these are not on file and we do not have record Next appt: 3 months with Dr Mariea Clonts.  Carlos American. [place], [facility] [phone]

## 2018-08-13 ENCOUNTER — Other Ambulatory Visit: Payer: Self-pay | Admitting: Nurse Practitioner

## 2018-08-13 LAB — VITAMIN D 25 HYDROXY (VIT D DEFICIENCY, FRACTURES): Vit D, 25-Hydroxy: 23 ng/mL — ABNORMAL LOW (ref 30–100)

## 2018-08-13 LAB — TEST AUTHORIZATION

## 2018-08-13 LAB — TSH: TSH: 1.88 mIU/L (ref 0.40–4.50)

## 2018-08-13 LAB — PSA: PSA: 1.3 ng/mL (ref <=4.0)

## 2018-08-13 MED ORDER — VITAMIN D (ERGOCALCIFEROL) 1.25 MG (50000 UNIT) PO CAPS: 50000.0000 [IU] | ORAL_CAPSULE | ORAL | 1 refills | Status: DC

## 2018-08-15 ENCOUNTER — Encounter: Payer: Self-pay | Admitting: Nurse Practitioner

## 2018-08-16 ENCOUNTER — Encounter: Payer: Self-pay | Admitting: Nurse Practitioner

## 2018-08-16 ENCOUNTER — Ambulatory Visit (INDEPENDENT_AMBULATORY_CARE_PROVIDER_SITE_OTHER): Payer: Medicare HMO | Admitting: Nurse Practitioner

## 2018-08-16 VITALS — BP 134/70 | HR 56 | Temp 97.7°F | Ht 69.0 in | Wt 172.0 lb

## 2018-08-16 DIAGNOSIS — R413 Other amnesia: Secondary | ICD-10-CM | POA: Diagnosis not present

## 2018-08-16 DIAGNOSIS — R350 Frequency of micturition: Secondary | ICD-10-CM | POA: Diagnosis not present

## 2018-08-16 DIAGNOSIS — H6123 Impacted cerumen, bilateral: Secondary | ICD-10-CM | POA: Diagnosis not present

## 2018-08-16 NOTE — Progress Notes (Signed)
Careteam: Patient Care Team: [name], NP as PCP - General (Geriatric Medicine) [name], MD as PCP - Cardiology (Cardiology) Center, [facility] (Gastroenterology)  Advanced Directive information    No Known Allergies  Chief Complaint  Patient presents with  . Follow-up    Ear fullness follow-up, patient also needs to go to lab      HPI: Patient is a 85 y.o. male seen in the office today for ear lavage.  Pt had ear lavage done on 08/12/2018 but unable to remove all of the cerumen, he has been using debrox and now back today for removal.   Review of Systems:  Review of Systems  HENT: Negative for ear pain and hearing loss.        Ear fullness    Past Medical History:  Diagnosis Date  . A-fib ([place])   . Aortic regurgitation   . Aortic stenosis   . CHF (congestive heart failure) ([place])   . Coronary artery disease   . Diabetes mellitus, type 2 ([place])   . Heart attack ([place])   . Hypertension   . Mitral regurgitation   . Mitral stenosis   . Sleep apnea   . Transient ischemic attack    10 to 12 years ago   Past Surgical History:  Procedure Laterality Date  . CARDIAC ELECTROPHYSIOLOGY STUDY AND ABLATION    . CATARACT EXTRACTION    . Debridement to right arm with MRSA infection     4 years ago   Social History:   reports that he has quit smoking. He has never used smokeless tobacco. He reports that he does not drink alcohol or use drugs.  Family History  Problem Relation Age of Onset  . Heart failure Mother   . Hyperlipidemia Mother   . Hypertension Son   . Hyperlipidemia Son   . Arthritis Son   . Arthritis Daughter   . Hypertension Son     Medications: Patient's Medications  New Prescriptions   No medications on file  Previous Medications   AMLODIPINE (NORVASC) 10 MG TABLET    Take 1 tablet (10 mg total) by mouth daily.   HYDRALAZINE (APRESOLINE) 25 MG TABLET    Take 1 tablet (25 mg total) by mouth 3 (three) times daily.   LOSARTAN  (COZAAR) 100 MG TABLET    Take 1 tablet (100 mg total) by mouth daily.   NYSTATIN (MYCOSTATIN/NYSTOP) POWDER    Apply topically 3 (three) times daily.   RIVAROXABAN (XARELTO) 20 MG TABS TABLET    Take 1 tablet (20 mg total) by mouth daily with supper.   SIMVASTATIN (ZOCOR) 20 MG TABLET    Take 20 mg by mouth daily.   VITAMIN D, ERGOCALCIFEROL, (DRISDOL) 1.25 MG (50000 UT) CAPS CAPSULE    Take 1 capsule (50,000 Units total) by mouth every 7 (seven) days.  Modified Medications   No medications on file  Discontinued Medications   No medications on file     Physical Exam:  Vitals:   08/16/18 1306  BP: 134/70  Pulse: (!) 56  Temp: 97.7 F (36.5 C)  TempSrc: Oral  SpO2: 98%  Weight: 172 lb (78 kg)  Height: 5\' 9"  (1.753 m)   Body mass index is 25.4 kg/m.  Physical Exam Constitutional:      Appearance: Normal appearance.  HENT:     Right Ear: Ear canal normal. There is impacted cerumen.     Left Ear: Ear canal normal. There is impacted cerumen.  Neurological:     Mental Status: He is alert.     Labs reviewed: Basic Metabolic Panel: Recent Labs    11/18/17 2000 02/15/18 08/07/18 1015 08/12/18 1228  NA 134* 141 144  --   K 4.2 4.3 4.5  --   CL 103  --  108  --   CO2 21*  --  28  --   GLUCOSE 165*  --  102*  --   BUN 18 20 28*  --   CREATININE 1.04 1.0 1.09  --   CALCIUM 8.7*  --  9.0  --   TSH  --   --   --  1.88   Liver Function Tests: Recent Labs    11/18/17 2000 08/07/18 1015  AST 25 16  ALT 22 22  ALKPHOS 89  --   BILITOT 0.8 0.5  PROT 7.5 6.9  ALBUMIN 4.2  --    No results for input(s): LIPASE, AMYLASE in the last 8760 hours. No results for input(s): AMMONIA in the last 8760 hours. CBC: Recent Labs    11/18/17 2000 08/07/18 1015  WBC 15.0* 6.9  NEUTROABS 13.5* 3,919  HGB 14.8 14.7  HCT 42.8 43.2  MCV 89.5 92.3  PLT 211 224   Lipid Panel: Recent Labs    02/15/18 08/07/18 1015  CHOL 127 150  HDL 34* 35*  LDLCALC 63 96  TRIG 149 94    CHOLHDL  --  4.3   TSH: Recent Labs    08/12/18 1228  TSH 1.88   A1C: Lab Results  Component Value Date   HGBA1C 6.1 (H) 08/07/2018     Assessment/Plan 1. Impacted cerumen, bilateral -cerumen flushed bilaterally, right ear required curette. Able to get out small-moderate amount of wax. Pt tolerated well.   Next appt: 11/11/2018 Carlos American. [place], [facility] [phone]

## 2018-08-17 LAB — B12 AND FOLATE PANEL
Folate: 13.3 ng/mL
VITAMIN B 12: 384 pg/mL (ref 200–1100)

## 2018-08-17 LAB — PSA: PSA: 1.2 ng/mL (ref <=4.0)

## 2018-08-19 ENCOUNTER — Telehealth: Payer: Self-pay

## 2018-08-19 MED ORDER — VITAMIN B-12 1000 MCG PO TABS: 1000.0000 ug | ORAL_TABLET | Freq: Every day | ORAL | Status: DC

## 2018-08-19 NOTE — Telephone Encounter (Signed)
Spoke with patient daughter [name] and provided lab results

## 2018-08-19 NOTE — Telephone Encounter (Signed)
-----   Message from [name], NP sent at 08/19/2018  7:37 AM EST ----- PSA and folate normal,  b12 is on the low end of normal, would recommend starting b12 supplement, to get OTC b12 1000 mcg daily by mouth

## 2018-09-03 ENCOUNTER — Encounter: Payer: Self-pay | Admitting: Nurse Practitioner

## 2018-10-06 ENCOUNTER — Other Ambulatory Visit: Payer: Self-pay | Admitting: Nurse Practitioner

## 2018-10-06 DIAGNOSIS — I480 Paroxysmal atrial fibrillation: Secondary | ICD-10-CM

## 2018-10-07 ENCOUNTER — Other Ambulatory Visit: Payer: Self-pay | Admitting: Nurse Practitioner

## 2018-10-07 DIAGNOSIS — I10 Essential (primary) hypertension: Secondary | ICD-10-CM

## 2018-10-10 DIAGNOSIS — R69 Illness, unspecified: Secondary | ICD-10-CM | POA: Diagnosis not present

## 2018-11-11 ENCOUNTER — Other Ambulatory Visit: Payer: Medicare HMO

## 2018-11-11 ENCOUNTER — Other Ambulatory Visit: Payer: Self-pay

## 2018-11-11 DIAGNOSIS — E785 Hyperlipidemia, unspecified: Secondary | ICD-10-CM | POA: Diagnosis not present

## 2018-11-11 DIAGNOSIS — E1140 Type 2 diabetes mellitus with diabetic neuropathy, unspecified: Secondary | ICD-10-CM | POA: Diagnosis not present

## 2018-11-12 LAB — LIPID PANEL
Cholesterol: 115 mg/dL (ref <200)
HDL: 38 mg/dL — ABNORMAL LOW (ref >=40)
LDL Cholesterol (Calc): 63 mg/dL (calc)
Non-HDL Cholesterol (Calc): 77 mg/dL (calc) (ref <130)
Total CHOL/HDL Ratio: 3 (calc) (ref <5.0)
Triglycerides: 65 mg/dL (ref <150)

## 2018-11-12 LAB — HEMOGLOBIN A1C
Hgb A1c MFr Bld: 5.8 % of total Hgb — ABNORMAL HIGH (ref <5.7)
Mean Plasma Glucose: 120 (calc)
eAG (mmol/L): 6.6 (calc)

## 2018-11-14 ENCOUNTER — Ambulatory Visit: Payer: Medicare HMO | Admitting: Nurse Practitioner

## 2018-11-15 ENCOUNTER — Other Ambulatory Visit: Payer: Self-pay | Admitting: Nurse Practitioner

## 2018-11-15 DIAGNOSIS — I10 Essential (primary) hypertension: Secondary | ICD-10-CM

## 2018-11-20 ENCOUNTER — Ambulatory Visit (INDEPENDENT_AMBULATORY_CARE_PROVIDER_SITE_OTHER): Payer: Medicare HMO | Admitting: Nurse Practitioner

## 2018-11-20 ENCOUNTER — Encounter: Payer: Self-pay | Admitting: Nurse Practitioner

## 2018-11-20 ENCOUNTER — Other Ambulatory Visit: Payer: Self-pay

## 2018-11-20 DIAGNOSIS — I480 Paroxysmal atrial fibrillation: Secondary | ICD-10-CM

## 2018-11-20 DIAGNOSIS — E559 Vitamin D deficiency, unspecified: Secondary | ICD-10-CM | POA: Diagnosis not present

## 2018-11-20 DIAGNOSIS — I10 Essential (primary) hypertension: Secondary | ICD-10-CM

## 2018-11-20 DIAGNOSIS — F0390 Unspecified dementia without behavioral disturbance: Secondary | ICD-10-CM | POA: Diagnosis not present

## 2018-11-20 DIAGNOSIS — I5189 Other ill-defined heart diseases: Secondary | ICD-10-CM | POA: Diagnosis not present

## 2018-11-20 DIAGNOSIS — E7800 Pure hypercholesterolemia, unspecified: Secondary | ICD-10-CM

## 2018-11-20 DIAGNOSIS — I2510 Atherosclerotic heart disease of native coronary artery without angina pectoris: Secondary | ICD-10-CM

## 2018-11-20 DIAGNOSIS — E1140 Type 2 diabetes mellitus with diabetic neuropathy, unspecified: Secondary | ICD-10-CM

## 2018-11-20 DIAGNOSIS — R69 Illness, unspecified: Secondary | ICD-10-CM | POA: Diagnosis not present

## 2018-11-20 MED ORDER — GABAPENTIN 100 MG PO CAPS: 100.0000 mg | ORAL_CAPSULE | Freq: Every day | ORAL | 1 refills | Status: DC | PRN

## 2018-11-20 MED ORDER — DONEPEZIL HCL 10 MG PO TABS: ORAL_TABLET | ORAL | 1 refills | Status: DC

## 2018-11-20 NOTE — Patient Instructions (Signed)
To start aricept 5 mg by mouth daily at bedtime for 1 month then increase to 10 mg daily at bedtime for memory

## 2018-11-20 NOTE — Progress Notes (Signed)
This service is provided via telemedicine  No vital signs collected/recorded due to the encounter was a telemedicine visit.   Location of patient (ex: home, work):  Home  Patient consents to a telephone visit:  Yes  Location of the provider (ex: office, home): [facility], Office   Name of any referring provider: N/A  Names of all persons participating in the telemedicine service and their role in the encounter:  S.Chrae B/CMA, Sherrie Mustache, NP, tina (pts daughter) and Patient   Time spent on call:  12 min with medical assistant   Virtual Visit via Telephone Note  I connected with Jose Patel on 11/20/18 at  1:00 PM EDT by telephone and verified that I am speaking with the correct person using two identifiers.   I discussed the limitations, risks, security and privacy concerns of performing an evaluation and management service by telephone and the availability of in person appointments. I also discussed with the patient that there may be a patient responsible charge related to this service. The patient expressed understanding and agreed to proceed.      Careteam: Patient Care Team: [name], NP as PCP - General (Geriatric Medicine) [name], MD as PCP - Cardiology (Cardiology) Center, [facility] Endoscopy (Gastroenterology) [name], MD as Consulting Physician  Advanced Directive information Does Patient Have a Medical Advance Directive?: Yes, Type of Advance Directive: [place];Living will, Does patient want to make changes to medical advance directive?: No - Patient declined  No Known Allergies  Chief Complaint  Patient presents with  . Medical Management of Chronic Issues    3 month follow-up and discuss labwork. Discuss memory. Tele-visit   . Medication Management    Patient never picked up prescribed vit d, please discuss      HPI: Patient is a 85 y.o. male for routine follow up on chronic conditions.    Hypertension- 127/72 per pt from home reading. Taking losartan, hydralazine,  Amlodipine and controlled on this regimen.   Hx of a fib- without palpitation or increase HR, continues on xarleto of anticoagulation.   Hyperlipidemia- LDL at goal 63 on simvastatin   Vit D def- daughter started 5000 units daily vs starting 50,000 units weekly. Prior was not on a supplement. Has been on 5000 units since January.   Memory loss- ongoing, repeating himself a lot. Feels like sometimes people don't hear him. interested in starting a medication to preserved memory.   DM- diet controlled, last A1c 5.8, daughter doing foot exams and encouraging him to look at feet.   Diabetic neuropathy previously was on gabapentin 300 mg by mouth TID but stopped taking this. States he has gabapentin 100 mg tables that he will take occasional if needed for pain.   Review of Systems:  Review of Systems  Constitutional: Negative for chills, fever and weight loss.  Respiratory: Negative for sputum production and shortness of breath.   Cardiovascular: Negative for chest pain, palpitations and leg swelling.  Gastrointestinal: Negative for constipation and diarrhea.  Genitourinary: Negative for dysuria, frequency and urgency.       Gets up once to urinate at night  Musculoskeletal: Negative for joint pain and myalgias.  Skin: Negative for itching and rash.  Neurological: Positive for tingling and sensory change. Negative for dizziness and headaches.  Psychiatric/Behavioral: Positive for memory loss. Negative for depression. The patient is not nervous/anxious and does not have insomnia.     Past Medical History:  Diagnosis Date  .  A-fib ([place])   . Anticoagulated on Coumadin    Per records from [place]   . Aortic regurgitation   . Aortic stenosis   . CHF (congestive heart failure) ([place])   . Coronary artery disease   . CVA (cerebral vascular accident) ([place])    hx of CVA noted on CT from 02/19/18  . Diabetes  mellitus, type 2 ([place])   . Gastro-esophageal reflux disease without esophagitis    Per records from previous provider, [facility]  . Heart attack ([place])   . History of CT scan of head 02/19/2018   Per records from previous provider, [facility]. Chronic changes small vessel disease  . History of ECG    03/26/14- Sinus Tachycardia @ 107 bmp, QRS 78 msec, QT 298 msec, QTc 361 msec. Per records from [place]  . History of Holter monitoring 11/08/2016   Per records from previous provider, [facility] and [facility]  . Hypertension   . 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 MI (myocardial infarction) [facility])    Per records from [place]   . Mitral regurgitation   . Mitral stenosis   . Other intervertebral disc degeneration, lumbar region    Per records from previous provider, [facility] and [facility]  . Radiculopathy, lumbar region    Per records from previous provider, [facility] and [facility]  . Sinus bradycardia    Per records from [place]   . Sleep apnea   . Transient ischemic attack    10 to 12 years ago  . Typical atrial flutter ([place])    Per records from [place]    Past Surgical History:  Procedure Laterality Date  . CARDIAC ELECTROPHYSIOLOGY STUDY AND ABLATION    . CATARACT EXTRACTION    . Debridement to right arm with MRSA infection     4 years ago  . GALLBLADDER SURGERY     Per records from [place] History:   reports that he has quit smoking. He has never used smokeless tobacco. He reports that he does not drink alcohol or use drugs.  Family History  Problem Relation Age of Onset  . Heart failure Mother   . Hyperlipidemia Mother   . Hypertension Son   . Hyperlipidemia Son   . Arthritis Son   . Arthritis Daughter   .  Hypertension Son     Medications: Patient's Medications  New Prescriptions   No medications on file  Previous Medications   AMLODIPINE (NORVASC) 10 MG TABLET    TAKE 1 TABLET BY MOUTH EVERY DAY   CHOLECALCIFEROL (VITAMIN D3) 125 MCG (5000 UT) TABS    Take by mouth daily.   HYDRALAZINE (APRESOLINE) 25 MG TABLET    Take 1 tablet (25 mg total) by mouth 3 (three) times daily.   LOSARTAN (COZAAR) 100 MG TABLET    TAKE 1 TABLET BY MOUTH EVERY DAY   SIMVASTATIN (ZOCOR) 20 MG TABLET    Take 20 mg by mouth daily.   VITAMIN B-12 (CYANOCOBALAMIN) 1000 MCG TABLET    Take 1 tablet (1,000 mcg total) by mouth daily.   VITAMIN D, ERGOCALCIFEROL, (DRISDOL) 1.25 MG (50000 UT) CAPS CAPSULE    Take 1 capsule (50,000 Units total) by mouth every 7 (seven) days.   XARELTO 20 MG TABS  TABLET    TAKE 1 TABLET (20 MG TOTAL) BY MOUTH DAILY WITH SUPPER.  Modified Medications   No medications on file  Discontinued Medications   NYSTATIN (MYCOSTATIN/NYSTOP) POWDER    Apply topically 3 (three) times daily.     Physical Exam:unable due to tele-visit.     Labs reviewed: Basic Metabolic Panel: Recent Labs    02/15/18 08/07/18 1015 08/12/18 1228  NA 141  141 144  --   K 4.3  4.3 4.5  --   CL  --  108  --   CO2  --  28  --   GLUCOSE  --  102*  --   BUN 20  20 28*  --   CREATININE 1.0  1.0 1.09  --   CALCIUM  --  9.0  --   TSH  --   --  1.88   Liver Function Tests: Recent Labs    08/07/18 1015  AST 16  ALT 22  BILITOT 0.5  PROT 6.9   No results for input(s): LIPASE, AMYLASE in the last 8760 hours. No results for input(s): AMMONIA in the last 8760 hours. CBC: Recent Labs    08/07/18 1015  WBC 6.9  NEUTROABS 3,919  HGB 14.7  HCT 43.2  MCV 92.3  PLT 224   Lipid Panel: Recent Labs    02/15/18 08/07/18 1015 11/11/18 0939  CHOL 127  127 150 115  HDL 34*  34* 35* 38*  LDLCALC 63  63 96 63  TRIG 149  149 94 65  CHOLHDL  --  4.3 3.0   TSH: Recent Labs    08/12/18 1228  TSH  1.88   A1C: Lab Results  Component Value Date   HGBA1C 5.8 (H) 11/11/2018     Assessment/Plan 1. Diastolic dysfunction Stable, without edema, chest pains or shortness of breath at this time.   2. Type 2 diabetes mellitus with diabetic neuropathy, without long-term current use of insulin (HCC) -diet controlled, will have occasional painful neuropathy that he uses gabapentin for if needed- refill requested - gabapentin (NEURONTIN) 100 MG capsule; Take 1 capsule (100 mg total) by mouth daily as needed.  Dispense: 30 capsule; Refill: 1  3. Dementia without behavioral disturbance, unspecified dementia type ([place]) -memory loss noted. Would like to start aricept to preserve memory.  - donepezil (ARICEPT) 10 MG tablet; 1/2 tablet by mouth daily at bedtime for 1 month then to increase to 1 tablet daily at bedtime for memory loss  Dispense: 30 tablet; Refill: 1  4. Essential hypertension Stable on current regimen. Will continue current medicaiton.   5. Paroxysmal atrial fibrillation (HCC) Continues on xarelto 20 mg by mouth daily for anticoagulation. Not on any rate controlling medication at this time.   6. Hypercholesteremia LDL goal <70, at goal with LDL 68, continues on simvastatin 20 mg by mouth daily   7. Coronary artery disease involving native coronary artery of native heart without angina pectoris Stable, without chest pains. LDL 68, proper blood pressure control.   8. Vit D Def Continues on Vit d 5,000 units daily for supplement, will follow up vit d level at next OV  Next appt: 04/18/2019  Carlos American. Harle Battiest  [facility] [phone]   Follow Up Instructions:    I discussed the assessment and treatment plan with the patient. The patient was provided an opportunity to ask questions and all were answered. The patient agreed with the plan and demonstrated an understanding of  the instructions.   The patient was advised to call back or seek an  in-person evaluation if the symptoms worsen or if the condition fails to improve as anticipated.  I provided 16 minutes of non-face-to-face time during this encounter.   [name], NP

## 2018-12-01 ENCOUNTER — Other Ambulatory Visit: Payer: Self-pay | Admitting: Nurse Practitioner

## 2018-12-01 DIAGNOSIS — I10 Essential (primary) hypertension: Secondary | ICD-10-CM

## 2018-12-13 ENCOUNTER — Other Ambulatory Visit: Payer: Self-pay | Admitting: Nurse Practitioner

## 2018-12-13 DIAGNOSIS — I480 Paroxysmal atrial fibrillation: Secondary | ICD-10-CM

## 2018-12-20 ENCOUNTER — Encounter (HOSPITAL_COMMUNITY): Payer: Self-pay | Admitting: Emergency Medicine

## 2018-12-20 ENCOUNTER — Telehealth: Payer: Self-pay | Admitting: Cardiovascular Disease

## 2018-12-20 ENCOUNTER — Emergency Department (HOSPITAL_COMMUNITY)
Admission: EM | Admit: 2018-12-20 | Discharge: 2018-12-20 | Disposition: A | Discharge status: Home / Self Care | Payer: Medicare HMO | Attending: Emergency Medicine | Admitting: Emergency Medicine

## 2018-12-20 ENCOUNTER — Other Ambulatory Visit: Payer: Self-pay

## 2018-12-20 DIAGNOSIS — I509 Heart failure, unspecified: Secondary | ICD-10-CM | POA: Insufficient documentation

## 2018-12-20 DIAGNOSIS — I252 Old myocardial infarction: Secondary | ICD-10-CM | POA: Diagnosis not present

## 2018-12-20 DIAGNOSIS — Z79899 Other long term (current) drug therapy: Secondary | ICD-10-CM | POA: Insufficient documentation

## 2018-12-20 DIAGNOSIS — I110 Hypertensive heart disease with heart failure: Secondary | ICD-10-CM | POA: Insufficient documentation

## 2018-12-20 DIAGNOSIS — Z87891 Personal history of nicotine dependence: Secondary | ICD-10-CM | POA: Insufficient documentation

## 2018-12-20 DIAGNOSIS — E119 Type 2 diabetes mellitus without complications: Secondary | ICD-10-CM | POA: Diagnosis not present

## 2018-12-20 DIAGNOSIS — I2510 Atherosclerotic heart disease of native coronary artery without angina pectoris: Secondary | ICD-10-CM | POA: Insufficient documentation

## 2018-12-20 DIAGNOSIS — R55 Syncope and collapse: Secondary | ICD-10-CM | POA: Diagnosis not present

## 2018-12-20 DIAGNOSIS — I480 Paroxysmal atrial fibrillation: Secondary | ICD-10-CM | POA: Diagnosis not present

## 2018-12-20 LAB — URINALYSIS, ROUTINE W REFLEX MICROSCOPIC
Bilirubin Urine: NEGATIVE
Glucose, UA: NEGATIVE mg/dL
Hgb urine dipstick: NEGATIVE
Ketones, ur: NEGATIVE mg/dL
Leukocytes,Ua: NEGATIVE
Nitrite: NEGATIVE
Protein, ur: NEGATIVE mg/dL
Specific Gravity, Urine: 1.011 (ref 1.005–1.030)
pH: 6 (ref 5.0–8.0)

## 2018-12-20 LAB — BASIC METABOLIC PANEL
Anion gap: 10 (ref 5–15)
BUN: 17 mg/dL (ref 8–23)
CO2: 25 mmol/L (ref 22–32)
Calcium: 9.7 mg/dL (ref 8.9–10.3)
Chloride: 104 mmol/L (ref 98–111)
Creatinine, Ser: 0.94 mg/dL (ref 0.61–1.24)
GFR calc Af Amer: >60 mL/min (ref >60)
GFR calc non Af Amer: >60 mL/min (ref >60)
Glucose, Bld: 151 mg/dL — ABNORMAL HIGH (ref 70–99)
Potassium: 5.7 mmol/L — ABNORMAL HIGH (ref 3.5–5.1)
Sodium: 139 mmol/L (ref 135–145)

## 2018-12-20 LAB — CBC WITH DIFFERENTIAL/PLATELET
Abs Immature Granulocytes: 0.01 10*3/uL (ref 0.00–0.07)
Basophils Absolute: 0 10*3/uL (ref 0.0–0.1)
Basophils Relative: 0 %
Eosinophils Absolute: 0.2 10*3/uL (ref 0.0–0.5)
Eosinophils Relative: 3 %
HCT: 46.7 % (ref 39.0–52.0)
Hemoglobin: 15.4 g/dL (ref 13.0–17.0)
Immature Granulocytes: 0 %
Lymphocytes Relative: 22 %
Lymphs Abs: 1.7 10*3/uL (ref 0.7–4.0)
MCH: 30.7 pg (ref 26.0–34.0)
MCHC: 33 g/dL (ref 30.0–36.0)
MCV: 93.2 fL (ref 80.0–100.0)
Monocytes Absolute: 0.5 10*3/uL (ref 0.1–1.0)
Monocytes Relative: 7 %
Neutro Abs: 5.2 10*3/uL (ref 1.7–7.7)
Neutrophils Relative %: 68 %
Platelets: 228 10*3/uL (ref 150–400)
RBC: 5.01 MIL/uL (ref 4.22–5.81)
RDW: 13.3 % (ref 11.5–15.5)
WBC: 7.6 10*3/uL (ref 4.0–10.5)
nRBC: 0 % (ref 0.0–0.2)

## 2018-12-20 LAB — CBG MONITORING, ED: Glucose-Capillary: 139 mg/dL — ABNORMAL HIGH (ref 70–99)

## 2018-12-20 LAB — POTASSIUM: Potassium: 4.1 mmol/L (ref 3.5–5.1)

## 2018-12-20 MED ORDER — METOPROLOL TARTRATE 5 MG/5ML IV SOLN
2.5000 mg | Freq: Once | INTRAVENOUS | Status: AC
  Administered 2018-12-20: 2.5 mg via INTRAVENOUS
  Filled 2018-12-20: qty 5

## 2018-12-20 MED ORDER — METOPROLOL TARTRATE 25 MG PO TABS
12.5000 mg | ORAL_TABLET | Freq: Once | ORAL | Status: AC
  Administered 2018-12-20: 12.5 mg via ORAL
  Filled 2018-12-20: qty 1

## 2018-12-20 MED ORDER — METOPROLOL SUCCINATE ER 25 MG PO TB24: 25.0000 mg | ORAL_TABLET | Freq: Every day | ORAL | 0 refills | Status: DC

## 2018-12-20 MED ORDER — SODIUM CHLORIDE 0.9 % IV BOLUS
500.0000 mL | Freq: Once | INTRAVENOUS | Status: AC
  Administered 2018-12-20: 500 mL via INTRAVENOUS

## 2018-12-20 NOTE — Telephone Encounter (Signed)
Spoke daughter at time of  Message -  Daughter states EMS WAS THERE AT [place] NOW_  HR RATE 106 , B/P 160/78--Recommend her to have EMS to take patient to ER- she stakes the live out of town but Ems would be taking patient to [place]\  SHE VERBALIZED UNDERSTANDING

## 2018-12-20 NOTE — Telephone Encounter (Signed)
Attempted to contact wife on home and cell phone. Left message to call back.

## 2018-12-20 NOTE — ED Triage Notes (Addendum)
Patient arrived from home via [place] with reports of syncopal event. Daughter reports that patient was trying to get up, when he fell back to the chair and was not responsive for about 5 mins.  On arrival, patient is A&O X4. He reports that he remembered trying to get up with his elbow on a table and then he saw EMS lifting him to a stretcher  HX: AFIB

## 2018-12-20 NOTE — Discharge Instructions (Signed)
It is not clear what caused your syncope.  The test results were reassuring, with the exception of atrial fibrillation.  Your heart rate was somewhat high so we prescribed medication to lower the heart rate.  A prescription was sent to your pharmacy to begin taking tomorrow morning.  Follow-up with your cardiologist soon as possible for discussion further treatment as needed.  You may require another cardioversion.  Return here, if needed, for problems.

## 2018-12-20 NOTE — ED Provider Notes (Addendum)
[facility] EMERGENCY DEPARTMENT Provider Note   CSN: [id number] Arrival date & time: 12/20/18  1227    History   Chief Complaint Chief Complaint  Patient presents with  . Loss of Consciousness    HPI Jose Patel is a 86 y.o. male.     HPI   Patient was eating breakfast this morning when he felt nauseated, been weak and dizzy like he would pass out.  Apparently he then passed out.  According to bystander, family member, he was unconscious for 5 minutes.  Patient recalls EMS arriving to transfer him here.  He states he was doing well yesterday, and did not have trouble eating, then.  He has been taking his medicines as prescribed but did not take any yet this morning.  He denies fever, chills, cough, shortness of breath, current nausea, chest pain, abdominal pain or back pain.  States he is now hungry and would like to try to eat something.  He states that he has atrial fibrillation and is compliant with his Xarelto.  He states that he has been in and out of atrial fibrillation in the past, and does not know when that happens.  No other recent changes in his medical history.  No known sick contacts.  No recent travel.  There are no other known modifying factors.  Past Medical History:  Diagnosis Date  . A-fib ([place])   . Anticoagulated on Coumadin    Per records from [place]   . Aortic regurgitation   . Aortic stenosis   . CHF (congestive heart failure) ([place])   . Coronary artery disease   . CVA (cerebral vascular accident) ([place])    hx of CVA noted on CT from 02/19/18  . Diabetes mellitus, type 2 ([place])   . Gastro-esophageal reflux disease without esophagitis    Per records from previous provider, [facility]  . Heart attack ([place])   . History of CT scan of head 02/19/2018   Per records from previous provider, [facility] and [facility]. Chronic changes small vessel disease  . History of ECG    03/26/14- Sinus  Tachycardia @ 107 bmp, QRS 78 msec, QT 298 msec, QTc 361 msec. Per records from [place]  . History of Holter monitoring 11/08/2016   Per records from previous provider, [facility]ish and [facility]  . Hypertension   . 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 MI (myocardial infarction) [facility])    Per records from [place]   . Mitral regurgitation   . Mitral stenosis   . Other intervertebral disc degeneration, lumbar region    Per records from previous provider, [facility] and [facility]  . Radiculopathy, lumbar region    Per records from previous provider, [facility]ish and [facility]  . Sinus bradycardia    Per records from [place]   . Sleep apnea   . Transient ischemic attack    10 to 12 years ago  . Typical atrial flutter [facility])    Per records from [place]     Patient Active Problem List   Diagnosis Date Noted  . Vitamin D deficiency 11/20/2018  . Aortic valve regurgitation 07/07/2018  . Sinus bradycardia 07/07/2018  . Long term (current) use of anticoagulants 07/07/2018  . Diastolic dysfunction 76/54/6503  . Hypercholesteremia 07/07/2018  . Mild dilation  of ascending aorta ([place]) 07/07/2018  . OSA (obstructive sleep apnea)   . Hypertension   . Diabetes mellitus, type 2 ([place])   . Coronary artery disease   . Paroxysmal atrial fibrillation ([facility])   . CHF (congestive heart failure) ([place])     Past Surgical History:  Procedure Laterality Date  . CARDIAC ELECTROPHYSIOLOGY STUDY AND ABLATION    . CATARACT EXTRACTION    . Debridement to right arm with MRSA infection     4 years ago  . GALLBLADDER SURGERY     Per records from [place] Medications    Prior to Admission medications   Medication Sig Start Date End Date Taking? Authorizing Provider  amLODipine (NORVASC) 10 MG tablet TAKE 1  TABLET BY MOUTH EVERY DAY 11/15/18   [name], NP  Cholecalciferol (VITAMIN D3) 125 MCG (5000 UT) TABS Take by mouth daily.    [provider]  donepezil (ARICEPT) 10 MG tablet 1/2 tablet by mouth daily at bedtime for 1 month then to increase to 1 tablet daily at bedtime for memory loss 11/20/18   [name], NP  gabapentin (NEURONTIN) 100 MG capsule Take 1 capsule (100 mg total) by mouth daily as needed. 11/20/18   [name], NP  hydrALAZINE (APRESOLINE) 25 MG tablet Take 1 tablet (25 mg total) by mouth 3 (three) times daily. 08/12/18   [name], NP  losartan (COZAAR) 100 MG tablet TAKE 1 TABLET BY MOUTH EVERY DAY 12/02/18   [name], NP  metoprolol succinate (TOPROL XL) 25 MG 24 hr tablet Take 1 tablet (25 mg total) by mouth daily. 12/20/18   Daleen Bo, MD  simvastatin (ZOCOR) 20 MG tablet Take 20 mg by mouth daily.    [provider]  vitamin B-12 (CYANOCOBALAMIN) 1000 MCG tablet Take 1 tablet (1,000 mcg total) by mouth daily. 08/19/18   [name], NP  Vitamin D, Ergocalciferol, (DRISDOL) 1.25 MG (50000 UT) CAPS capsule Take 1 capsule (50,000 Units total) by mouth every 7 (seven) days. Patient not taking: Reported on 11/20/2018 08/13/18   [name], NP  XARELTO 20 MG TABS tablet TAKE 1 TABLET (20 MG TOTAL) BY MOUTH DAILY WITH SUPPER. 12/13/18   [name], NP    Family History Family History  Problem Relation Age of Onset  . Heart failure Mother   . Hyperlipidemia Mother   . Hypertension Son   . Hyperlipidemia Son   . Arthritis Son   . Arthritis Daughter   . Hypertension Son     Social History Social History   Tobacco Use  . Smoking status: Former Research scientist (life sciences)  . Smokeless tobacco: Never Used  . Tobacco comment: Quit at age 30  Substance Use Topics  . Alcohol use: Never    Frequency: Never  . Drug use: Never     Allergies   Patient has no known allergies.   Review of Systems Review of  Systems  All other systems reviewed and are negative.    Physical Exam Updated Vital Signs BP 134/87   Pulse 72   Temp 98.1 F (36.7 C) (Oral)   Resp 13   Ht 5\' 9"  (1.753 m)   Wt 79.4 kg   SpO2 95%   BMI 25.84 kg/m   Physical Exam Vitals signs and nursing note reviewed.  Constitutional:      General: He is not in acute distress.    Appearance:  He is well-developed and normal weight. He is not ill-appearing, toxic-appearing or diaphoretic.  HENT:     Head: Normocephalic and atraumatic.     Right Ear: External ear normal.     Left Ear: External ear normal.     Mouth/Throat:     Mouth: Mucous membranes are moist.     Pharynx: No oropharyngeal exudate or posterior oropharyngeal erythema.  Eyes:     Conjunctiva/sclera: Conjunctivae normal.     Pupils: Pupils are equal, round, and reactive to light.  Neck:     Musculoskeletal: Normal range of motion and neck supple.     Trachea: Phonation normal.  Cardiovascular:     Rate and Rhythm: Normal rate and regular rhythm.     Heart sounds: Normal heart sounds.  Pulmonary:     Effort: Pulmonary effort is normal.     Breath sounds: Normal breath sounds.  Abdominal:     Palpations: Abdomen is soft.     Tenderness: There is no abdominal tenderness.  Musculoskeletal: Normal range of motion.        General: No swelling or tenderness.  Skin:    General: Skin is warm and dry.  Neurological:     Mental Status: He is alert and oriented to person, place, and time.     Cranial Nerves: No cranial nerve deficit.     Sensory: No sensory deficit.     Motor: No abnormal muscle tone.     Coordination: Coordination normal.  Psychiatric:        Mood and Affect: Mood normal.        Behavior: Behavior normal.        Thought Content: Thought content normal.        Judgment: Judgment normal.      ED Treatments / Results  Labs (all labs ordered are listed, but only abnormal results are displayed) Labs Reviewed  BASIC METABOLIC PANEL -  Abnormal; Notable for the following components:      Result Value   Potassium 5.7 (*)    Glucose, Bld 151 (*)    All other components within normal limits  CBG MONITORING, ED - Abnormal; Notable for the following components:   Glucose-Capillary 139 (*)    All other components within normal limits  CBC WITH DIFFERENTIAL/PLATELET  URINALYSIS, ROUTINE W REFLEX MICROSCOPIC  POTASSIUM    EKG EKG Interpretation  Date/Time:  Friday Dec 20 2018 12:37:03 EDT Ventricular Rate:  105 PR Interval:    QRS Duration: 83 QT Interval:  343 QTC Calculation: 542 R Axis:   -17 Text Interpretation:  Atrial fibrillation Probable LVH with secondary repol abnrm Since last tracing Atrial fibrillation is new Confirmed by Daleen Bo [phone]) on 12/20/2018 12:51:07 PM   Radiology No results found.  Procedures .Critical Care Performed by: Daleen Bo, MD Authorized by: Daleen Bo, MD   Critical care provider statement:    Critical care time (minutes):  35   Critical care start time:  12/20/2018 12:40 PM   Critical care end time:  12/20/2018 4:45 PM   Critical care time was exclusive of:  Separately billable procedures and treating other patients   Critical care was necessary to treat or prevent imminent or life-threatening deterioration of the following conditions:  Cardiac failure   Critical care was time spent personally by me on the following activities:  Blood draw for specimens, development of treatment plan with patient or surrogate, discussions with consultants, evaluation of patient's response to treatment, examination of patient, obtaining history  from patient or surrogate, ordering and performing treatments and interventions, ordering and review of laboratory studies, pulse oximetry, re-evaluation of patient's condition, review of old charts and ordering and review of radiographic studies   (including critical care time)  Medications Ordered in ED Medications  sodium chloride 0.9 %  bolus 500 mL (0 mLs Intravenous Stopped 12/20/18 1521)  metoprolol tartrate (LOPRESSOR) injection 2.5 mg (2.5 mg Intravenous Given 12/20/18 1528)  metoprolol tartrate (LOPRESSOR) tablet 12.5 mg (12.5 mg Oral Given 12/20/18 1652)     Initial Impression / Assessment and Plan / ED Course  I have reviewed the triage vital signs and the nursing notes.  Pertinent labs & imaging results that were available during my care of the patient were reviewed by me and considered in my medical decision making (see chart for details).  Clinical Course as of Dec 20 1710  Fri Dec 20, 2018  1304 Initial evaluation consistent with vasovagal episode.  Will screen for medical complications, and treat mild tachycardia with a fluid bolus.  Patient does have a history of atrial fibrillation and is currently on Xarelto.   [EW]  1502 Normal  Urinalysis, Routine w reflex microscopic [EW]  1503 Normal  CBC with Differential [EW]  1503 Normal except potassium elevated, glucose high  Basic metabolic panel(!) [EW]  5573 Mild elevation  CBG monitoring, ED(!) [EW]    Clinical Course User Index [EW] Daleen Bo, MD        Patient Vitals for the past 24 hrs:  BP Temp Temp src Pulse Resp SpO2 Height Weight  12/20/18 1652 134/87 - - 72 - - - -  12/20/18 1645 134/87 - - (!) 47 13 95 % - -  12/20/18 1630 139/89 - - 91 18 96 % - -  12/20/18 1615 (!) 149/84 - - 81 15 96 % - -  12/20/18 1600 134/80 - - 80 14 93 % - -  12/20/18 1545 (!) 146/71 - - 76 (!) 28 96 % - -  12/20/18 1530 (!) 152/91 - - (!) 102 (!) 21 94 % - -  12/20/18 1515 (!) 159/81 - - - 16 - - -  12/20/18 1500 - - - - 14 - - -  12/20/18 1445 (!) 165/99 - - 69 14 98 % - -  12/20/18 1430 136/89 - - (!) 102 14 97 % - -  12/20/18 1415 138/84 - - - 16 - - -  12/20/18 1400 (!) 159/89 - - (!) 103 16 98 % - -  12/20/18 1345 (!) 173/100 - - (!) 119 17 93 % - -  12/20/18 1330 (!) 187/86 - - (!) 113 16 96 % - -  12/20/18 1300 (!) 157/91 - - 76 16 97 % - -   12/20/18 1245 124/81 - - (!) 120 18 96 % - -  12/20/18 1242 - - - - - - 5\' 9"  (1.753 m) 79.4 kg  12/20/18 1240 (!) 166/87 98.1 F (36.7 C) Oral (!) 105 - 97 % - -  12/20/18 1235 (!) 166/87 - - (!) 57 15 97 % - -    3:11 PM Reevaluation with update and discussion. After initial assessment and treatment, an updated evaluation reveals patient is comfortable now.  Heart rate still irregular, 110/min.  Patient has eaten since arrival here.  He understands the current plan. Daleen Bo   Medical Decision Making: Syncope, cause not clear.  No evidence for CVA, significant injury, metabolic instability or serious infection.  Recurrent  atrial fibrillation, currently on Xarelto.  Patient was offered cardioversion but did not want to stay for that.  Rate control initiated with Lopressor with improvement, and dose of oral metoprolol, with prescription for extended acting metoprolol sent to his pharmacy, plan to begin tomorrow morning.  Doubt ACS, PE or pneumonia.  CRITICAL CARE-yes Performed by: Daleen Bo  CHA2DS2/VAS Stroke Risk Points  Current as of a minute ago     6 >= 2 Points: High Risk  1 - 1.99 Points: Medium Risk  0 Points: Low Risk    The patient's score has not changed in the past year.:  No Change     Details    This score determines the patient's risk of having a stroke if the  patient has atrial fibrillation.       Points Metrics  1 Has Congestive Heart Failure:  Yes    Current as of a minute ago  1 Has Vascular Disease:  Yes    Current as of a minute ago  1 Has Hypertension:  Yes    Current as of a minute ago  2 Age:  27    Current as of a minute ago  1 Has Diabetes:  Yes    Current as of a minute ago  0 Had Stroke:  No  Had TIA:  No  Had thromboembolism:  No    Current as of a minute ago  0 Male:  No    Current as of a minute ago       Nursing Notes Reviewed/ Care Coordinated Applicable Imaging Reviewed Interpretation of Laboratory Data incorporated into ED  treatment  The patient appears reasonably screened and/or stabilized for discharge and I doubt any other medical condition or other [facility] requiring further screening, evaluation, or treatment in the ED at this time prior to discharge.  Plan: Home Medications-continue current medications; Home Treatments-rest, fluids; return here if the recommended treatment, does not improve the symptoms; Recommended follow up-patient to contact cardiology to arrange for further evaluation and treatment.     Final Clinical Impressions(s) / ED Diagnoses   Final diagnoses:  Syncope, unspecified syncope type  Paroxysmal atrial fibrillation [facility])    ED Discharge Orders         Ordered    metoprolol succinate (TOPROL XL) 25 MG 24 hr tablet  Daily     12/20/18 1710           Daleen Bo, MD 12/20/18 1714    Daleen Bo, MD 12/30/18 1746

## 2018-12-20 NOTE — ED Notes (Signed)
Nurse Navigator communication: At pts request, his son Aleksi was called to give an update on pt status and plan of care. He wishes to be notified if the patient is to be admitted or discharged.

## 2018-12-20 NOTE — Telephone Encounter (Signed)
New Message    Patient's wife calling stating patient has a syncopal episode this morning and passed out.  Patient also in afib and the ambulance is there evaluating him.  But due to Covid-19 she wants to speak to a nurse to see if the patient should go to the hospital with the paramedics or wait for a doctors appointment.

## 2018-12-24 ENCOUNTER — Other Ambulatory Visit: Payer: Self-pay

## 2018-12-24 ENCOUNTER — Telehealth: Payer: Self-pay | Admitting: Cardiovascular Disease

## 2018-12-24 ENCOUNTER — Encounter: Payer: Self-pay | Admitting: Cardiology

## 2018-12-24 ENCOUNTER — Ambulatory Visit: Payer: Medicare HMO | Admitting: Cardiology

## 2018-12-24 ENCOUNTER — Other Ambulatory Visit (HOSPITAL_COMMUNITY)
Admission: RE | Admit: 2018-12-24 | Discharge: 2018-12-24 | Disposition: A | Discharge status: Home / Self Care | Payer: Medicare HMO | Source: Ambulatory Visit | Attending: Internal Medicine | Admitting: Internal Medicine

## 2018-12-24 VITALS — BP 110/72 | HR 69 | Ht 69.0 in | Wt 169.4 lb

## 2018-12-24 DIAGNOSIS — Z87898 Personal history of other specified conditions: Secondary | ICD-10-CM

## 2018-12-24 DIAGNOSIS — I480 Paroxysmal atrial fibrillation: Secondary | ICD-10-CM | POA: Diagnosis not present

## 2018-12-24 DIAGNOSIS — Z8679 Personal history of other diseases of the circulatory system: Secondary | ICD-10-CM

## 2018-12-24 DIAGNOSIS — Z9989 Dependence on other enabling machines and devices: Secondary | ICD-10-CM

## 2018-12-24 DIAGNOSIS — G4733 Obstructive sleep apnea (adult) (pediatric): Secondary | ICD-10-CM | POA: Diagnosis not present

## 2018-12-24 DIAGNOSIS — I10 Essential (primary) hypertension: Secondary | ICD-10-CM | POA: Diagnosis not present

## 2018-12-24 DIAGNOSIS — Z1159 Encounter for screening for other viral diseases: Secondary | ICD-10-CM | POA: Insufficient documentation

## 2018-12-24 DIAGNOSIS — I4819 Other persistent atrial fibrillation: Secondary | ICD-10-CM | POA: Diagnosis not present

## 2018-12-24 LAB — SARS CORONAVIRUS 2 BY RT PCR (HOSPITAL ORDER, PERFORMED IN ~~LOC~~ HOSPITAL LAB): SARS Coronavirus 2: NEGATIVE

## 2018-12-24 NOTE — Telephone Encounter (Signed)
With verbal permission spoke with pt's daughter who report pt was seen in the ED on 5/22 for a syncopal episode. She report since discharged, pt has been very weak, and with any little exertion, he feels as if he's going to pass out. Pt also report feeling a little SOB. Vitals this morning were HR 81 and BP 137/70. Daughter state pt was offered a cardioversion in ED but declined. She report pt is now willing to proceed with anything MD recommends. Advise Daughter message will be routed to triage PA.
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
## 2018-12-24 NOTE — Telephone Encounter (Signed)
Jose Patel,  Can pt be seen today with DOD or APP?  He was in ER on Friday.  May be due to his a fib.

## 2018-12-24 NOTE — Patient Instructions (Addendum)
Medication Instructions:  Your physician recommends that you continue on your current medications as directed. Please refer to the Current Medication list given to you today.  If you need a refill on your cardiac medications before your next appointment, please call your pharmacy.   Lab work: TODAY: BMET, CBC & TSH  Your Pre-procedure COVID-19 Testing will be done on 12/24/2018 at [facility] at [address] [place], [place] [address] After your swab you will be given a mask to wear and instructed to go home and quarantine/no visitors until after your procedure. If you test positive you will be notified and your procedure will be cancelled.   If you have labs (blood work) drawn today and your tests are completely normal, you will receive your results only by: Marland Kitchen MyChart Message (if you have MyChart) OR . A paper copy in the mail If you have any lab test that is abnormal or we need to change your treatment, we will call you to review the results.  Testing/Procedures: Dear Jose Patel  You are scheduled for a TEE/Cardioversion/TEE Cardioversion on 12/26/2018 with Dr. Debara Pickett .  Please arrive at the [facility] (Main Entrance A) at [facility]: [address] [place], [place] [address] at 1:45 am/pm. (1 hour prior to procedure unless lab work is needed; if lab work is needed arrive 1.5 hours ahead)  DIET: Nothing to eat or drink after midnight except a sip of water with medications (see medication instructions below)  Medication Instructions:  Continue your anticoagulant: Xarelto   You will need to continue your anticoagulant after your procedure until you  are told by your  Provider that it is safe to stop   Labs: If patient is on Coumadin, patient needs pt/INR, CBC, BMET within 3 days (No pt/INR needed for patients taking Xarelto, Eliquis, Pradaxa) For patients receiving anesthesia for TEE and all Cardioversion patients: BMET, CBC  within 1 week   You must have a responsible person to drive you home and stay in the waiting area during your procedure. Failure to do so could result in cancellation.  Bring your insurance cards.  *Special Note: Every effort is made to have your procedure done on time. Occasionally there are emergencies that occur at the hospital that may cause delays. Please be patient if a delay does occur.      Follow-Up: At [facility], you and your health needs are our priority.  As part of our continuing mission to provide you with exceptional heart care, we have created designated Provider Care Teams.  These Care Teams include your primary Cardiologist (physician) and Advanced Practice Providers (APPs -  Physician Assistants and Nurse Practitioners) who all work together to provide you with the care you need, when you need it. You will need a follow up appointment in 2 weeks.  You may see Jose Klein, Jose Patel or one of the following Advanced Practice Providers on your designated Care Team: [place], [place] . Fabian Sharp, PA-C  Any Other Special Instructions Will Be Listed Below (If Applicable).

## 2018-12-24 NOTE — Telephone Encounter (Signed)
Daughter will call back with pt on the line to get permission to speak on his behalf.

## 2018-12-24 NOTE — Telephone Encounter (Signed)
Pt has been scheduled to see Pecolia Ades, PA-C today, 5/26/29020 @ 1:45. Pt aware that he will be screened and he needed to wear a mask.  [id number] prescreen questions have been asked below.  Pt thanked me for the call.        COVID-19 Pre-Screening Questions:  . In the past 7 to 10 days have you had a cough,  shortness of breath, headache, congestion, fever (100 or greater) body aches, chills, sore throat, or sudden loss of taste or sense of smell? NO . Have you been around anyone with known Covid 19. NO . Have you been around anyone who is awaiting Covid 19 test results in the past 7 to 10 days? NO . Have you been around anyone who has been exposed to Covid 19, or has mentioned symptoms of Covid 19 within the past 7 to 10 days? NO  If you have any concerns/questions about symptoms patients report during screening (either on the phone or at threshold). Contact the provider seeing the patient or DOD for further guidance.  If neither are available contact a member of the leadership team.

## 2018-12-24 NOTE — Telephone Encounter (Signed)
New Message   Patient's daughter calling back in to continue conversation.

## 2018-12-24 NOTE — Telephone Encounter (Signed)
New Message:    Patient daughter calling concerning her father. Patient just got out the hospital and still very weak. Please call patient daughter back.

## 2018-12-25 LAB — CBC
Hematocrit: 42.6 % (ref 37.5–51.0)
Hemoglobin: 14.2 g/dL (ref 13.0–17.7)
MCH: 31.1 pg (ref 26.6–33.0)
MCHC: 33.3 g/dL (ref 31.5–35.7)
MCV: 93 fL (ref 79–97)
Platelets: 211 10*3/uL (ref 150–450)
RBC: 4.56 x10E6/uL (ref 4.14–5.80)
RDW: 13 % (ref 11.6–15.4)
WBC: 7.2 10*3/uL (ref 3.4–10.8)

## 2018-12-25 LAB — BASIC METABOLIC PANEL
BUN/Creatinine Ratio: 23 (ref 10–24)
BUN: 26 mg/dL (ref 8–27)
CO2: 22 mmol/L (ref 20–29)
Calcium: 9.5 mg/dL (ref 8.6–10.2)
Chloride: 102 mmol/L (ref 96–106)
Creatinine, Ser: 1.12 mg/dL (ref 0.76–1.27)
GFR calc Af Amer: 70 mL/min/{1.73_m2} (ref >59)
GFR calc non Af Amer: 60 mL/min/{1.73_m2} (ref >59)
Glucose: 112 mg/dL — ABNORMAL HIGH (ref 65–99)
Potassium: 4.9 mmol/L (ref 3.5–5.2)
Sodium: 140 mmol/L (ref 134–144)

## 2018-12-25 LAB — TSH: TSH: 2.2 u[IU]/mL (ref 0.450–4.500)

## 2018-12-26 ENCOUNTER — Ambulatory Visit (HOSPITAL_COMMUNITY): Payer: Medicare HMO | Admitting: Anesthesiology

## 2018-12-26 ENCOUNTER — Ambulatory Visit (HOSPITAL_COMMUNITY)
Admission: RE | Admit: 2018-12-26 | Discharge: 2018-12-26 | Disposition: A | Discharge status: Home / Self Care | Payer: Medicare HMO | Attending: Internal Medicine | Admitting: Internal Medicine

## 2018-12-26 ENCOUNTER — Other Ambulatory Visit: Payer: Self-pay

## 2018-12-26 ENCOUNTER — Encounter (HOSPITAL_COMMUNITY): Payer: Self-pay | Admitting: Anesthesiology

## 2018-12-26 ENCOUNTER — Ambulatory Visit (HOSPITAL_BASED_OUTPATIENT_CLINIC_OR_DEPARTMENT_OTHER)
Admission: RE | Admit: 2018-12-26 | Discharge: 2018-12-26 | Disposition: A | Discharge status: Home / Self Care | Payer: Medicare HMO | Source: Ambulatory Visit | Attending: Cardiology | Admitting: Cardiology

## 2018-12-26 ENCOUNTER — Encounter (HOSPITAL_COMMUNITY)
Admission: RE | Disposition: A | Discharge status: Home / Self Care | Payer: Self-pay | Source: Home / Self Care | Attending: Internal Medicine

## 2018-12-26 ENCOUNTER — Telehealth: Payer: Self-pay | Admitting: Cardiovascular Disease

## 2018-12-26 DIAGNOSIS — I351 Nonrheumatic aortic (valve) insufficiency: Secondary | ICD-10-CM | POA: Insufficient documentation

## 2018-12-26 DIAGNOSIS — I4819 Other persistent atrial fibrillation: Secondary | ICD-10-CM | POA: Diagnosis not present

## 2018-12-26 DIAGNOSIS — I4891 Unspecified atrial fibrillation: Secondary | ICD-10-CM | POA: Diagnosis not present

## 2018-12-26 DIAGNOSIS — Z8673 Personal history of transient ischemic attack (TIA), and cerebral infarction without residual deficits: Secondary | ICD-10-CM | POA: Insufficient documentation

## 2018-12-26 DIAGNOSIS — K219 Gastro-esophageal reflux disease without esophagitis: Secondary | ICD-10-CM | POA: Insufficient documentation

## 2018-12-26 DIAGNOSIS — I480 Paroxysmal atrial fibrillation: Secondary | ICD-10-CM | POA: Diagnosis not present

## 2018-12-26 DIAGNOSIS — E119 Type 2 diabetes mellitus without complications: Secondary | ICD-10-CM | POA: Diagnosis not present

## 2018-12-26 DIAGNOSIS — I509 Heart failure, unspecified: Secondary | ICD-10-CM | POA: Insufficient documentation

## 2018-12-26 DIAGNOSIS — Z7901 Long term (current) use of anticoagulants: Secondary | ICD-10-CM | POA: Diagnosis not present

## 2018-12-26 DIAGNOSIS — Z87891 Personal history of nicotine dependence: Secondary | ICD-10-CM | POA: Diagnosis not present

## 2018-12-26 DIAGNOSIS — I2510 Atherosclerotic heart disease of native coronary artery without angina pectoris: Secondary | ICD-10-CM | POA: Insufficient documentation

## 2018-12-26 DIAGNOSIS — G4733 Obstructive sleep apnea (adult) (pediatric): Secondary | ICD-10-CM | POA: Insufficient documentation

## 2018-12-26 DIAGNOSIS — I252 Old myocardial infarction: Secondary | ICD-10-CM | POA: Diagnosis not present

## 2018-12-26 DIAGNOSIS — I110 Hypertensive heart disease with heart failure: Secondary | ICD-10-CM | POA: Diagnosis not present

## 2018-12-26 HISTORY — PX: CARDIOVERSION: SHX1299

## 2018-12-26 HISTORY — PX: TEE WITHOUT CARDIOVERSION: SHX5443

## 2018-12-26 SURGERY — ECHOCARDIOGRAM, TRANSESOPHAGEAL
Anesthesia: Monitor Anesthesia Care

## 2018-12-26 MED ORDER — LACTATED RINGERS IV SOLN
INTRAVENOUS | Status: DC
  Administered 2018-12-26: 14:00:00 via INTRAVENOUS

## 2018-12-26 MED ORDER — PROPOFOL 500 MG/50ML IV EMUL
INTRAVENOUS | Status: DC | PRN
  Administered 2018-12-26: 100 ug/kg/min via INTRAVENOUS

## 2018-12-26 MED ORDER — EPHEDRINE SULFATE-NACL 50-0.9 MG/10ML-% IV SOSY
PREFILLED_SYRINGE | INTRAVENOUS | Status: DC | PRN
  Administered 2018-12-26: 5 mg via INTRAVENOUS

## 2018-12-26 MED ORDER — SODIUM CHLORIDE 0.9 % IV SOLN: INTRAVENOUS | Status: DC

## 2018-12-26 NOTE — Telephone Encounter (Signed)
New Message   Patients daughter is calling because her father had a cardioversion today and was told that he needed to schedule a f/u in two weeks. Offered a virtual appt on 6/11 with Dr. Sallyanne Kuster and she declined said that he needed to be seen in office. Please call patient.

## 2018-12-26 NOTE — Transfer of Care (Signed)
Immediate Anesthesia Transfer of Care Note  Patient: Lavel Rieman  Procedure(s) Performed: TRANSESOPHAGEAL ECHOCARDIOGRAM (TEE) (N/A ) CARDIOVERSION (N/A )  Patient Location: Endoscopy Unit  Anesthesia Type:General  Level of Consciousness: oriented, drowsy and patient cooperative  Airway & Oxygen Therapy: Patient Spontanous Breathing and Patient connected to nasal cannula oxygen  Post-op Assessment: Report given to RN and Post -op Vital signs reviewed and stable  Post vital signs: Reviewed  Last Vitals:  Vitals Value Taken Time  BP 135/45 12/26/2018  2:35 PM  Temp    Pulse 36 12/26/2018  2:35 PM  Resp 14 12/26/2018  2:35 PM  SpO2 98 % 12/26/2018  2:35 PM    Last Pain:  Vitals:   12/26/18 1349  TempSrc: Oral  PainSc: 0-No pain         Complications: No apparent anesthesia complications

## 2018-12-26 NOTE — Anesthesia Procedure Notes (Signed)
Procedure Name: General with mask airway Date/Time: 12/26/2018 2:06 PM Performed by: Jenne Campus, CRNA Pre-anesthesia Checklist: Patient identified, Emergency Drugs available, Suction available and Patient being monitored Patient Re-evaluated:Patient Re-evaluated prior to induction Oxygen Delivery Method: Nasal cannula

## 2018-12-26 NOTE — CV Procedure (Signed)
TEE/CARDIOVERSION NOTE  TRANSESOPHAGEAL ECHOCARDIOGRAM (TEE):  Indictation: Atrial Fibrillation  Consent:   Informed consent was obtained prior to the procedure. The risks, benefits and alternatives for the procedure were discussed and the patient comprehended these risks.  Risks include, but are not limited to, cough, sore throat, vomiting, nausea, somnolence, esophageal and stomach trauma or perforation, bleeding, low blood pressure, aspiration, pneumonia, infection, trauma to the teeth and death.    Time Out: Verified patient identification, verified procedure, site/side was marked, verified correct patient position, special equipment/implants available, medications/allergies/relevent history reviewed, required imaging and test results available. Performed  Procedure:  After a procedural time-out, the patient was given propofol per anesthesia for sedation. The patient's heart rate, blood pressure, and oxygen saturation are monitored continuously during the procedure. The transesophageal probe was inserted in the esophagus and stomach without difficulty and multiple views were obtained. Agitated microbubble saline contrast was not administered.  Complications:    Complications: None Patient did tolerate procedure well.  Findings:  1. LEFT VENTRICLE: The left ventricular wall thickness is mildly increased.  The left ventricular cavity is normal in size. Wall motion is normal.  LVEF is 60-65%.  2. RIGHT VENTRICLE:  The right ventricle is normal in structure and function without any thrombus or masses.    3. LEFT ATRIUM:  The left atrium is moderately dilated in size without any thrombus or masses.  There is not spontaneous echo contrast ("smoke") in the left atrium consistent with a low flow state.  4. LEFT ATRIAL APPENDAGE:  The left atrial appendage is free of any thrombus or masses. The appendage has single lobes. Pulse doppler indicates moderate flow in the appendage.  5.  ATRIAL SEPTUM:  The atrial septum appears intact and is free of thrombus and/or masses.  There is no evidence for interatrial shunting by color doppler.  6. RIGHT ATRIUM:  The right atrium is mildly dilated in size and function without any thrombus or masses.  7. MITRAL VALVE:  The mitral valve poorly coapts with Mild regurgitation.  There were no vegetations or stenosis.  8. AORTIC VALVE:  The aortic valve is trileaflet, but sclerotic in structure and function with mild to moderate regurgitation.  There were no vegetations or stenosis  9. TRICUSPID VALVE:  The tricuspid valve is normal in structure and function with trivial regurgitation.  There were no vegetations or stenosis  10.  PULMONIC VALVE:  The pulmonic valve is normal in structure and function with trivial regurgitation.  There were no vegetations or stenosis.   11. AORTIC ARCH, ASCENDING AND DESCENDING AORTA:  There was grade 1 Ron Parker et. Al, 1992) atherosclerosis of the ascending aorta, aortic arch, or proximal descending aorta. The ascending aorta is dilated to 4.4 cm at the most distally visualized point.  12. PULMONARY VEINS: Anomalous pulmonary venous return was not noted.  13. PERICARDIUM: The pericardium appeared normal and non-thickened.  There is no pericardial effusion.  CARDIOVERSION:     Second Time Out: Verified patient identification, verified procedure, site/side was marked, verified correct patient position, special equipment/implants available, medications/allergies/relevent history reviewed, required imaging and test results available.  Performed  Procedure:  1. Patient placed on cardiac monitor, pulse oximetry, supplemental oxygen as necessary.  2. Sedation administered per anesthesia 3. Pacer pads placed anterior and posterior chest. 4. Cardioverted 1 time(s).  5. Cardioverted at 150J biphasic.  Complications:  Complications: None Patient did tolerate procedure well.  Impression:  1. No LAA thrombus  2. Negative for PFO by color  doppler 3. Mild to moderate AR 4. Moderate LAE 5. LVEF 60-65% 6. Successful DCCV with a single 150J biphasic shock to sinus bradycardia.  Recommendations:  1.  After cardioversion, he was noted to be in sinus rhythm in the upper 30's to low 40's. BP was normal/stable and he was asymptomatic. I have recommended he discontinue his Toprol XL 25 mg daily starting tomorrow and to allow today's 'dose to wear off. He is scheduled for follow-up in 2 weeks.  Time Spent Directly with the Patient:  45 minutes   Pixie Casino, MD, [facility], [place] Director of the Advanced Lipid Disorders &  Cardiovascular Risk Reduction Clinic Diplomate of the American Board of Clinical Lipidology Attending Cardiologist  Direct Dial: [phone]  Fax: [phone]  Website:  [email or web address]  12/26/2018, 3:00 PM

## 2018-12-26 NOTE — H&P (Signed)
   INTERVAL PROCEDURE H&P  History and Physical Interval Note:  12/26/2018 1:45 PM  Jose Patel has presented today for their planned procedure. The various methods of treatment have been discussed with the patient and family. After consideration of risks, benefits and other options for treatment, the patient has consented to the procedure.  The patients' outpatient history has been reviewed, patient examined, and no change in status from most recent office note within the past 30 days. I have reviewed the patients' chart and labs and will proceed as planned. Questions were answered to the patient's satisfaction.   Pixie Casino, MD, [facility], [place] Director of the Advanced Lipid Disorders &  Cardiovascular Risk Reduction Clinic Diplomate of the American Board of Clinical Lipidology Attending Cardiologist  Direct Dial: [phone]  Fax: [phone]  Website:  [email or web address] 12/26/2018, 1:45 PM

## 2018-12-26 NOTE — Anesthesia Postprocedure Evaluation (Signed)
Anesthesia Post Note  Patient: Jose Patel  Procedure(s) Performed: TRANSESOPHAGEAL ECHOCARDIOGRAM (TEE) (N/A ) CARDIOVERSION (N/A )     Patient location during evaluation: PACU Anesthesia Type: General Level of consciousness: awake and alert Pain management: pain level controlled Vital Signs Assessment: post-procedure vital signs reviewed and stable Respiratory status: spontaneous breathing, nonlabored ventilation, respiratory function stable and patient connected to nasal cannula oxygen Cardiovascular status: blood pressure returned to baseline and stable Postop Assessment: no apparent nausea or vomiting Anesthetic complications: no    Last Vitals:  Vitals:   12/26/18 1435 12/26/18 1447  BP: (!) 135/45 (!) 138/47  Pulse: (!) 36 (!) 36  Resp: 14 20  Temp: 36.7 C   SpO2: 98% 98%    Last Pain:  Vitals:   12/26/18 1447  TempSrc:   PainSc: 0-No pain                 [name] P Spenser Harren

## 2018-12-26 NOTE — Telephone Encounter (Signed)
Spoke to pt's daughter. She report pt had a cardioversion today and was advised to f/u up with Dr. Loletha Grayer in two weeks. Daughter was offered virtual appointment by scheduler but declined. Will route message to MD and nurse for approval to schedule in office visit.

## 2018-12-26 NOTE — Anesthesia Preprocedure Evaluation (Addendum)
Anesthesia Evaluation  Patient identified by MRN, date of birth, ID band Patient awake    Reviewed: Allergy & Precautions, NPO status , Patient's Chart, lab work & pertinent test results  Airway Mallampati: III  TM Distance: >3 FB Neck ROM: Full    Dental  (+) Missing, Dental Advisory Given   Pulmonary sleep apnea , former smoker,    Pulmonary exam normal breath sounds clear to auscultation       Cardiovascular hypertension, Pt. on medications and Pt. on home beta blockers + CAD, + Past MI and +CHF  Normal cardiovascular exam+ dysrhythmias Atrial Fibrillation + Valvular Problems/Murmurs  Rhythm:Regular Rate:Normal     Neuro/Psych Radiculopathy, lumbar region TIACVA negative psych ROS   GI/Hepatic Neg liver ROS, GERD  ,  Endo/Other  diabetes  Renal/GU negative Renal ROS     Musculoskeletal negative musculoskeletal ROS (+)   Abdominal   Peds  Hematology HLD   Anesthesia Other Findings A-FIB  Reproductive/Obstetrics                           Anesthesia Physical Anesthesia Plan  ASA: III  Anesthesia Plan: General   Post-op Pain Management:    Induction: Intravenous  PONV Risk Score and Plan: 1 and Propofol infusion and Treatment may vary due to age or medical condition  Airway Management Planned: Nasal Cannula and Mask  Additional Equipment:   Intra-op Plan:   Post-operative Plan:   Informed Consent: I have reviewed the patients History and Physical, chart, labs and discussed the procedure including the risks, benefits and alternatives for the proposed anesthesia with the patient or authorized representative who has indicated his/her understanding and acceptance.     Dental advisory given  Plan Discussed with: CRNA  Anesthesia Plan Comments:       Anesthesia Quick Evaluation

## 2018-12-26 NOTE — Telephone Encounter (Signed)
The way the schedule is currently made, I will not be back in the office until July 15. If they are willing to wait until then, please put him on that day. Please explain that we limit the number of providers in the office on any given day. If they want to be seen sooner it can be arranged with a different provider., But I think he can probably wait until July.

## 2018-12-26 NOTE — Progress Notes (Signed)
  Echocardiogram Echocardiogram Transesophageal has been performed.  Jose Patel 12/26/2018, 2:34 PM

## 2018-12-27 ENCOUNTER — Encounter (HOSPITAL_COMMUNITY): Payer: Self-pay | Admitting: Internal Medicine

## 2018-12-27 NOTE — Telephone Encounter (Signed)
Spoke with the patient's daughter. She stated that she would feel better if the patient was seen in the office. Message will be sent to scheduling to set up this appointment with a provider that is in the office.

## 2018-12-29 ENCOUNTER — Other Ambulatory Visit: Payer: Self-pay | Admitting: Nurse Practitioner

## 2018-12-29 DIAGNOSIS — I10 Essential (primary) hypertension: Secondary | ICD-10-CM

## 2018-12-30 ENCOUNTER — Other Ambulatory Visit: Payer: Self-pay | Admitting: *Deleted

## 2018-12-30 DIAGNOSIS — I10 Essential (primary) hypertension: Secondary | ICD-10-CM

## 2018-12-30 MED ORDER — HYDRALAZINE HCL 25 MG PO TABS: 25.0000 mg | ORAL_TABLET | Freq: Three times a day (TID) | ORAL | 1 refills | Status: DC

## 2018-12-30 NOTE — Telephone Encounter (Signed)
[facility]

## 2019-01-15 ENCOUNTER — Telehealth: Payer: Self-pay | Admitting: Cardiology

## 2019-01-15 NOTE — Telephone Encounter (Signed)
LVM on both home and cell phone regarding appointment with Kerin Ransom on 01-16-19.

## 2019-01-16 ENCOUNTER — Ambulatory Visit: Payer: Medicare HMO | Admitting: Cardiology

## 2019-01-16 ENCOUNTER — Telehealth: Payer: Self-pay | Admitting: Radiology

## 2019-01-16 ENCOUNTER — Encounter: Payer: Self-pay | Admitting: Cardiology

## 2019-01-16 ENCOUNTER — Other Ambulatory Visit: Payer: Self-pay

## 2019-01-16 ENCOUNTER — Telehealth: Payer: Self-pay | Admitting: *Deleted

## 2019-01-16 VITALS — BP 162/60 | HR 53 | Temp 97.9°F | Ht 70.0 in | Wt 171.0 lb

## 2019-01-16 DIAGNOSIS — I480 Paroxysmal atrial fibrillation: Secondary | ICD-10-CM | POA: Diagnosis not present

## 2019-01-16 DIAGNOSIS — G4733 Obstructive sleep apnea (adult) (pediatric): Secondary | ICD-10-CM

## 2019-01-16 DIAGNOSIS — I77810 Thoracic aortic ectasia: Secondary | ICD-10-CM | POA: Diagnosis not present

## 2019-01-16 DIAGNOSIS — I5189 Other ill-defined heart diseases: Secondary | ICD-10-CM

## 2019-01-16 DIAGNOSIS — Z7901 Long term (current) use of anticoagulants: Secondary | ICD-10-CM

## 2019-01-16 DIAGNOSIS — E1140 Type 2 diabetes mellitus with diabetic neuropathy, unspecified: Secondary | ICD-10-CM | POA: Diagnosis not present

## 2019-01-16 DIAGNOSIS — R001 Bradycardia, unspecified: Secondary | ICD-10-CM

## 2019-01-16 DIAGNOSIS — I351 Nonrheumatic aortic (valve) insufficiency: Secondary | ICD-10-CM | POA: Diagnosis not present

## 2019-01-16 DIAGNOSIS — I10 Essential (primary) hypertension: Secondary | ICD-10-CM | POA: Diagnosis not present

## 2019-01-16 DIAGNOSIS — R5383 Other fatigue: Secondary | ICD-10-CM

## 2019-01-16 NOTE — Assessment & Plan Note (Signed)
CHADS VASC=3, on Xarelto

## 2019-01-16 NOTE — Assessment & Plan Note (Signed)
Pt has exertional fatigue- ? Chronotropic incompetence

## 2019-01-16 NOTE — Assessment & Plan Note (Signed)
Fair control.

## 2019-01-16 NOTE — Assessment & Plan Note (Signed)
HR 52 today

## 2019-01-16 NOTE — Patient Instructions (Signed)
Medication Instructions:  Your physician recommends that you continue on your current medications as directed. Please refer to the Current Medication list given to you today. If you need a refill on your cardiac medications before your next appointment, please call your pharmacy.   Lab work: None  If you have labs (blood work) drawn today and your tests are completely normal, you will receive your results only by: Marland Kitchen MyChart Message (if you have MyChart) OR . A paper copy in the mail If you have any lab test that is abnormal or we need to change your treatment, we will call you to review the results.  Testing/Procedures: Your physician has recommended that you wear a 7 DAY ZIO-PATCH monitor. The Zio patch cardiac monitor continuously records heart rhythm data for up to 14 days, this is for patients being evaluated for multiple types heart rhythms. For the first 24 hours post application, please avoid getting the Zio monitor wet in the shower or by excessive sweating during exercise. After that, feel free to carry on with regular activities. Keep soaps and lotions away from the ZIO XT Patch.  This will be placed at our [facility] location - [address].         Follow-Up: At [facility], you and your health needs are our priority.  As part of our continuing mission to provide you with exceptional heart care, we have created designated Provider Care Teams.  These Care Teams include your primary Cardiologist (physician) and Advanced Practice Providers (APPs -  Physician Assistants and Nurse Practitioners) who all work together to provide you with the care you need, when you need it. You will need a follow up appointment in 1 months OR FIRST AVAILABLE.  You may see [name], MD or one of the following Advanced Practice Providers on your designated Care Team: [place], [place] . Fabian Sharp, PA-C  Any Other Special Instructions Will Be Listed Below (If Applicable).

## 2019-01-16 NOTE — Progress Notes (Signed)
Cardiology Office Note:    Date:  01/16/2019   ID:  Jose Patel, DOB 1934/02/11, MRN [id number]  PCP:  [name], NP  Cardiologist:  Thurmon Fair, MD  Electrophysiologist:  None   Referring MD: [name], NP   Chief Complaint  Patient presents with  . Follow-up post cardioversion    History of Present Illness:    Jose Patel is a pleasant 85 y.o. male who moved her from [facility] in Dec 2019 with a hx of PAF, dilated AO root, HTN, grade 2 DD (without CHF), OSA with intermittent compliance with C-pap, and HLD on statin Rx.  The patient was established with Dr. Royann Shivers in December.  Echocardiogram then revealed an ejection fraction of 60 to 65%, grade 2 diastolic dysfunction, mild to moderate AR, and an aortic root of 4.2 to 4.5 cm.  The patient was in normal sinus rhythm then.  He had previous atrial fibrillation ablation back in 2015 but had done well since.  He was on chronic anticoagulation with Xarelto.  The patient then presented to the emergency room 12/20/2018 with a syncopal spell.  He was noted to be in atrial fibrillation.  It was decided to proceed with cardioversion, because the patient could not confirm he had not missed any Xarelto doses it was decided to do a transesophageal echo as well.  This was done on 12/26/2018.  Postop he was in sinus rhythm with a heart rate of 42.  He is seen in the office today in follow-up.  His daughter accompanied him.  The patient says he has been doing well since his cardioversion although he admits he has fatigue with any exertion.  He is not had syncope or tachycardia.  His EKG in the office today shows sinus rhythm sinus bradycardia at 52.  Past Medical History:  Diagnosis Date  . A-fib (HCC)   . Anticoagulated on Coumadin    Per records from [facility]   . Aortic regurgitation   . Aortic stenosis   . CHF (congestive heart failure) (HCC)   . Coronary artery disease   . CVA (cerebral vascular accident) (HCC)    hx of  CVA noted on CT from 02/19/18  . Diabetes mellitus, type 2 (HCC)   . Gastro-esophageal reflux disease without esophagitis    Per records from previous provider, [facility]  . Heart attack (HCC)   . History of CT scan of head 02/19/2018   Per records from previous provider, [facility]. Chronic changes small vessel disease  . History of ECG    03/26/14- Sinus Tachycardia @ 107 bmp, QRS 78 msec, QT 298 msec, QTc 361 msec. Per records from [facility]  . History of Holter monitoring 11/08/2016   Per records from previous provider, [facility]  . Hypertension   . 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 MI (myocardial infarction) [facility])    Per records from [facility]   . Mitral regurgitation   . Mitral stenosis   . Other intervertebral disc degeneration, lumbar region    Per records from previous provider, [facility]  . Radiculopathy, lumbar region    Per records from previous provider, [facility]  . Sinus bradycardia    Per records from [facility]   . Sleep apnea   .  Transient ischemic attack    10 to 12 years ago  . Typical atrial flutter (HCC)    Per records from [facility]     Past Surgical History:  Procedure Laterality Date  . CARDIAC ELECTROPHYSIOLOGY STUDY AND ABLATION    . CARDIOVERSION N/A 12/26/2018   Procedure: CARDIOVERSION;  Surgeon: Chrystie Nose, MD;  Location: [facility] ENDOSCOPY;  Service: Cardiovascular;  Laterality: N/A;  . CATARACT EXTRACTION    . Debridement to right arm with MRSA infection     4 years ago  . GALLBLADDER SURGERY     Per records from [facility]   . TEE WITHOUT CARDIOVERSION N/A 12/26/2018   Procedure: TRANSESOPHAGEAL ECHOCARDIOGRAM (TEE);  Surgeon: Chrystie Nose, MD;  Location: [facility]  ENDOSCOPY;  Service: Cardiovascular;  Laterality: N/A;    Current Medications: Current Meds  Medication Sig  . amLODipine (NORVASC) 10 MG tablet TAKE 1 TABLET BY MOUTH EVERY DAY  . Cholecalciferol (VITAMIN D3) 125 MCG (5000 UT) TABS Take 5,000 Units by mouth daily.   Marland Kitchen donepezil (ARICEPT) 10 MG tablet 1/2 tablet by mouth daily at bedtime for 1 month then to increase to 1 tablet daily at bedtime for memory loss  . gabapentin (NEURONTIN) 100 MG capsule Take 1 capsule (100 mg total) by mouth daily as needed. (Patient taking differently: Take 100 mg by mouth at bedtime. )  . hydrALAZINE (APRESOLINE) 25 MG tablet Take 1 tablet (25 mg total) by mouth 3 (three) times daily.  Marland Kitchen losartan (COZAAR) 100 MG tablet TAKE 1 TABLET BY MOUTH EVERY DAY  . simvastatin (ZOCOR) 20 MG tablet Take 20 mg by mouth daily.  . vitamin B-12 (CYANOCOBALAMIN) 1000 MCG tablet Take 1 tablet (1,000 mcg total) by mouth daily.  . Vitamin D, Ergocalciferol, (DRISDOL) 1.25 MG (50000 UT) CAPS capsule Take 1 capsule (50,000 Units total) by mouth every 7 (seven) days.  Carlena Hurl 20 MG TABS tablet TAKE 1 TABLET (20 MG TOTAL) BY MOUTH DAILY WITH SUPPER. (Patient taking differently: Take 20 mg by mouth daily with supper. )     Allergies:   Patient has no known allergies.   Social History   Socioeconomic History  . Marital status: Married    Spouse name: Not on file  . Number of children: Not on file  . Years of education: Not on file  . Highest education level: Not on file  Occupational History  . Not on file  Social Needs  . Financial resource strain: Not on file  . Food insecurity    Worry: Not on file    Inability: Not on file  . Transportation needs    Medical: Not on file    Non-medical: Not on file  Tobacco Use  . Smoking status: Former Games developer  . Smokeless tobacco: Never Used  . Tobacco comment: Quit at age 24  Substance and Sexual Activity  . Alcohol use: Never    Frequency: Never  . Drug use: Never  .  Sexual activity: Not Currently  Lifestyle  . Physical activity    Days per week: Not on file    Minutes per session: Not on file  . Stress: Not on file  Relationships  . Social Musician on phone: Not on file    Gets together: Not on file    Attends religious service: Not on file    Active member of club or organization: Not on file    Attends meetings of clubs or organizations: Not on file  Relationship status: Not on file  Other Topics Concern  . Not on file  Social History Narrative   Social History      Diet?       Do you drink/eat things with caffeine? yes      Marital status?          married                          What year were you married? 1957      Do you live in a house, apartment, assisted living, condo, trailer, etc.? home      Is it one or more stories? 1      How many persons live in your home? 4      Do you have any pets in your home? (please list) yes- 3 dogs, 1 cat      Highest level of education completed? 12 yrs + trade school      Current or past profession: Paediatric nurse, Market researcher TV lineman      Do you exercise?           no                           Type & how often?      Advanced Directives      Do you have a living will? yes      Do you have a DNR form?                                  If not, do you want to discuss one? no      Do you have signed POA/HPOA for forms? yes      Functional Status      Do you have difficulty bathing or dressing yourself? no      Do you have difficulty preparing food or eating? no      Do you have difficulty managing your medications? no      Do you have difficulty managing your finances? no      Do you have difficulty affording your medications? Yes xarelto     Family History: The patient's family history includes Arthritis in his daughter and son; Heart failure in his mother; Hyperlipidemia in his mother and son; Hypertension in his son and son.  ROS:   Please see the history of present  illness.     All other systems reviewed and are negative.  EKGs/Labs/Other Studies Reviewed:    The following studies were reviewed today: TEE 2019-01-02  EKG:  EKG is  ordered today.  The ekg ordered today demonstrates NSR- SB 52  Recent Labs: 08/07/2018: ALT 22 12/24/2018: BUN 26; Creatinine, Ser 1.12; Hemoglobin 14.2; Platelets 211; Potassium 4.9; Sodium 140; TSH 2.200  Recent Lipid Panel    Component Value Date/Time   CHOL 115 11/11/2018 0939   TRIG 65 11/11/2018 0939   HDL 38 (L) 11/11/2018 0939   CHOLHDL 3.0 11/11/2018 0939   LDLCALC 63 11/11/2018 0939    Physical Exam:    VS:  BP (!) 162/60   Pulse (!) 53   Temp 97.9 F (36.6 C)   Ht 5\' 10"  (1.778 m)   Wt 171 lb (77.6 kg)   SpO2 98%   BMI 24.54 kg/m     Wt Readings from Last 3 Encounters:  01/16/19 171 lb (77.6 kg)  12/26/18 169 lb 6.4 oz (76.8 kg)  12/24/18 169 lb 6.4 oz (76.8 kg)     GEN: Well nourished, well developed in no acute distress HEENT: Normal NECK: No JVD; No carotid bruits LYMPHATICS: No lymphadenopathy CARDIAC: RRR, no murmurs, rubs, gallops RESPIRATORY:  Clear to auscultation without rales, wheezing or rhonchi  ABDOMEN: Soft, non-tender, non-distended MUSCULOSKELETAL:  No edema; No deformity  SKIN: Warm and dry NEUROLOGIC:  Alert and oriented x 3 PSYCHIATRIC:  Normal affect   ASSESSMENT:    Essential hypertension Fair control  Fatigue Pt has exertional fatigue- ? Chronotropic incompetence   Paroxysmal atrial fibrillation (HCC) H/O PAF in 2015- s/p RFA ([place]) Recurrent PAF with slow VR and syncope May 2020- S/P TEE DCCV 12/26/2018  Long term (current) use of anticoagulants CHADS VASC=3, on Xarelto  Mild dilation of ascending aorta (HCC) 4.2 cm x 4.5 cm by echo May 2020  Sinus bradycardia HR 52 today  PLAN:    Normally I would order a POET to see what his HR does with exercise but because of the COVID pandemic this is not currently an option.  I suggested a 7 day Zio.  F/U  with Dr Royann Shivers in 2-3 months or sooner if his monitor shows significant bradycardia.     Medication Adjustments/Labs and Tests Ordered: Current medicines are reviewed at length with the patient today.  Concerns regarding medicines are outlined above.  Orders Placed This Encounter  Procedures  . LONG TERM MONITOR (3-14 DAYS)  . EKG 12-Lead   No orders of the defined types were placed in this encounter.   Patient Instructions  Medication Instructions:  Your physician recommends that you continue on your current medications as directed. Please refer to the Current Medication list given to you today. If you need a refill on your cardiac medications before your next appointment, please call your pharmacy.   Lab work: None  If you have labs (blood work) drawn today and your tests are completely normal, you will receive your results only by: Marland Kitchen MyChart Message (if you have MyChart) OR . A paper copy in the mail If you have any lab test that is abnormal or we need to change your treatment, we will call you to review the results.  Testing/Procedures: Your physician has recommended that you wear a 7 DAY ZIO-PATCH monitor. The Zio patch cardiac monitor continuously records heart rhythm data for up to 14 days, this is for patients being evaluated for multiple types heart rhythms. For the first 24 hours post application, please avoid getting the Zio monitor wet in the shower or by excessive sweating during exercise. After that, feel free to carry on with regular activities. Keep soaps and lotions away from the ZIO XT Patch.  This will be placed at our [facility] location - [address], [address].         Follow-Up: At [facility], you and your health needs are our priority.  As part of our continuing mission to provide you with exceptional heart care, we have created designated Provider Care Teams.  These Care Teams include your primary Cardiologist (physician) and Advanced Practice  Providers (APPs -  Physician Assistants and Nurse Practitioners) who all work together to provide you with the care you need, when you need it. You will need a follow up appointment in 1 months OR FIRST AVAILABLE.  You may see Thurmon Fair, MD or one of the following Advanced Practice Providers on  your designated Care Team: Azalee Course, [place] . Micah Flesher, PA-C  Any Other Special Instructions Will Be Listed Below (If Applicable).       Signed, Corine Shelter, PA-C  01/16/2019 10:17 AM    [facility]

## 2019-01-16 NOTE — Assessment & Plan Note (Signed)
H/O PAF in 2015- s/p RFA ([place]) Recurrent PAF with slow VR and syncope May 2020- S/P TEE DCCV 12/26/2018

## 2019-01-16 NOTE — Telephone Encounter (Signed)
7 day ZIO XT long term holter monitor patch to be mailed to patients home.  Instructions reviewed briefly as they are included in the monitor kit.

## 2019-01-16 NOTE — Assessment & Plan Note (Signed)
4.2 cm x 4.5 cm by echo May 2020

## 2019-01-16 NOTE — Telephone Encounter (Signed)
Tried calling to verify address/insurance to have monitor mailed and to briefly go over monitor instructions

## 2019-01-16 NOTE — Telephone Encounter (Signed)
See other telephone note shelly W ordered monitor on 6-18

## 2019-01-21 ENCOUNTER — Ambulatory Visit (INDEPENDENT_AMBULATORY_CARE_PROVIDER_SITE_OTHER): Payer: Medicare HMO

## 2019-01-21 DIAGNOSIS — I351 Nonrheumatic aortic (valve) insufficiency: Secondary | ICD-10-CM

## 2019-01-21 DIAGNOSIS — G4733 Obstructive sleep apnea (adult) (pediatric): Secondary | ICD-10-CM | POA: Diagnosis not present

## 2019-01-21 DIAGNOSIS — Z7901 Long term (current) use of anticoagulants: Secondary | ICD-10-CM | POA: Diagnosis not present

## 2019-01-21 DIAGNOSIS — R5383 Other fatigue: Secondary | ICD-10-CM | POA: Diagnosis not present

## 2019-01-21 DIAGNOSIS — I480 Paroxysmal atrial fibrillation: Secondary | ICD-10-CM | POA: Diagnosis not present

## 2019-01-21 DIAGNOSIS — I77810 Thoracic aortic ectasia: Secondary | ICD-10-CM | POA: Diagnosis not present

## 2019-02-02 ENCOUNTER — Other Ambulatory Visit: Payer: Self-pay | Admitting: Nurse Practitioner

## 2019-02-02 DIAGNOSIS — I10 Essential (primary) hypertension: Secondary | ICD-10-CM

## 2019-02-03 NOTE — Telephone Encounter (Signed)
Requested medication not on current medication list. Patient will need to call the office to discuss prior to refill

## 2019-02-04 ENCOUNTER — Other Ambulatory Visit: Payer: Self-pay | Admitting: Nurse Practitioner

## 2019-02-04 DIAGNOSIS — I10 Essential (primary) hypertension: Secondary | ICD-10-CM

## 2019-02-05 ENCOUNTER — Other Ambulatory Visit: Payer: Self-pay | Admitting: Nurse Practitioner

## 2019-02-05 DIAGNOSIS — I10 Essential (primary) hypertension: Secondary | ICD-10-CM

## 2019-02-05 NOTE — Telephone Encounter (Signed)
Requested medication not on current medication list. Rx refused and notation made for patient to contact the office prior to refill

## 2019-02-05 NOTE — Telephone Encounter (Signed)
Spoke with patient's family member and confirmed patient is not taking requested medication

## 2019-02-06 DIAGNOSIS — I480 Paroxysmal atrial fibrillation: Secondary | ICD-10-CM | POA: Diagnosis not present

## 2019-02-06 DIAGNOSIS — R5383 Other fatigue: Secondary | ICD-10-CM | POA: Diagnosis not present

## 2019-02-07 ENCOUNTER — Telehealth: Payer: Self-pay | Admitting: *Deleted

## 2019-02-07 DIAGNOSIS — I10 Essential (primary) hypertension: Secondary | ICD-10-CM

## 2019-02-07 MED ORDER — LOSARTAN POTASSIUM 50 MG PO TABS: 100.0000 mg | ORAL_TABLET | Freq: Every day | ORAL | 1 refills | Status: DC

## 2019-02-07 MED ORDER — HYDRALAZINE HCL 25 MG PO TABS: 25.0000 mg | ORAL_TABLET | Freq: Two times a day (BID) | ORAL | 1 refills | Status: DC

## 2019-02-07 NOTE — Telephone Encounter (Signed)
1) can send losartan 50 mg 2 tablets daily 2) we can try to have him take it twice daily but may not get the most optimal coverage for blood pressure, he can try but to monitor BP 3) ok for 90 day supply

## 2019-02-07 NOTE — Telephone Encounter (Signed)
Jose Patel, daughter called and stated that she had some Concerns:  1. Losartan 100mg  is on back order. Can we send in for Losartan 50mg  Two tablets once daily.   2. Patient is complaining about taking his Hydralazine three times daily. Stated that he is forgetting the afternoon dose. Wants to know if they can take it twice daily instead or have it changed to something he only takes twice daily.   3. Requesting all Rx's to be 90 day supply.   Please Advise.

## 2019-02-07 NOTE — Telephone Encounter (Signed)
Patient daughter notified and agreed.  Rx faxed to pharmacy Medication list updated.  Daughter will call office when Rx's are due.

## 2019-02-08 ENCOUNTER — Other Ambulatory Visit: Payer: Self-pay | Admitting: Nurse Practitioner

## 2019-02-08 DIAGNOSIS — I480 Paroxysmal atrial fibrillation: Secondary | ICD-10-CM

## 2019-02-11 ENCOUNTER — Other Ambulatory Visit: Payer: Self-pay

## 2019-02-12 ENCOUNTER — Telehealth: Payer: Self-pay | Admitting: *Deleted

## 2019-02-12 NOTE — Telephone Encounter (Signed)
-----   Message from [name], MD sent at 02/12/2019  8:04 AM EDT ----- No dangerous rhythms and no cause for syncope seen on monitor. At this point, no reason to consider pacemaker implantation

## 2019-02-12 NOTE — Telephone Encounter (Signed)
Left a message for the patient to call back.  

## 2019-02-13 NOTE — Telephone Encounter (Signed)
Patient's daughter, per dpr, made aware of results and verbalized understanding.

## 2019-02-18 ENCOUNTER — Telehealth: Payer: Self-pay | Admitting: *Deleted

## 2019-02-18 NOTE — Telephone Encounter (Signed)
Left a message for the patient to call back to get consent for the virtual visit appointment tomorrow.

## 2019-02-18 NOTE — Telephone Encounter (Signed)
The patient and daughter have been made aware to have a blood pressure and weight for the virtual visit tomorrow.     Virtual Visit Pre-Appointment Phone Call  "(Name), I am calling you today to discuss your upcoming appointment. We are currently trying to limit exposure to the virus that causes COVID-19 by seeing patients at home rather than in the office."  1. "What is the BEST phone number to call the day of the visit?" - include this in appointment notes  2. "Do you have or have access to (through a family member/friend) a smartphone with video capability that we can use for your visit?" a. If yes - list this number in appt notes as "cell" (if different from BEST phone #) and list the appointment type as a VIDEO visit in appointment notes b. If no - list the appointment type as a PHONE visit in appointment notes  3. Confirm consent - "In the setting of the current Covid19 crisis, you are scheduled for a (phone or video) visit with your provider on (date) at (time).  Just as we do with many in-office visits, in order for you to participate in this visit, we must obtain consent.  If you'd like, I can send this to your mychart (if signed up) or email for you to review.  Otherwise, I can obtain your verbal consent now.  All virtual visits are billed to your insurance company just like a normal visit would be.  By agreeing to a virtual visit, we'd like you to understand that the technology does not allow for your provider to perform an examination, and thus may limit your provider's ability to fully assess your condition. If your provider identifies any concerns that need to be evaluated in person, we will make arrangements to do so.  Finally, though the technology is pretty good, we cannot assure that it will always work on either your or our end, and in the setting of a video visit, we may have to convert it to a phone-only visit.  In either situation, we cannot ensure that we have a secure  connection.  Are you willing to proceed?" YES  4. Advise patient to be prepared - "Two hours prior to your appointment, go ahead and check your blood pressure, pulse, oxygen saturation, and your weight (if you have the equipment to check those) and write them all down. When your visit starts, your provider will ask you for this information. If you have an Apple Watch or Kardia device, please plan to have heart rate information ready on the day of your appointment. Please have a pen and paper handy nearby the day of the visit as well."  5. Give patient instructions for MyChart download to smartphone OR [email or web address] as below if video visit (depending on what platform provider is using)  6. Inform patient they will receive a phone call 15 minutes prior to their appointment time (may be from unknown caller ID) so they should be prepared to answer    TELEPHONE CALL NOTE  Jose Patel has been deemed a candidate for a follow-up tele-health visit to limit community exposure during the Covid-19 pandemic. I spoke with the patient via phone to ensure availability of phone/video source, confirm preferred email & phone number, and discuss instructions and expectations.  I reminded Jose Patel to be prepared with any vital sign and/or heart rhythm information that could potentially be obtained via home monitoring, at the time of his visit. I reminded Jose Patel to  expect a phone call prior to his visit.  Jose Barker, RN 02/18/2019 4:23 PM   INSTRUCTIONS FOR DOWNLOADING THE MYCHART APP TO SMARTPHONE  - The patient must first make sure to have activated MyChart and know their login information - If Apple, go to [employer] and type in MyChart in the search bar and download the app. If Android, ask patient to go to [employer] and type in [place] in the search bar and download the app. The app is free but as with any other app downloads, their phone may require them to verify saved payment  information or Apple/Android password.  - The patient will need to then log into the app with their MyChart username and password, and select  as their healthcare provider to link the account. When it is time for your visit, go to the MyChart app, find appointments, and click Begin Video Visit. Be sure to Select Allow for your device to access the Microphone and Camera for your visit. You will then be connected, and your provider will be with you shortly.  **If they have any issues connecting, or need assistance please contact MyChart service desk [phone])**  **If using a computer, in order to ensure the best quality for their visit they will need to use either of the following Internet Browsers: [employer], or Google Chrome**  IF USING DOXIMITY or DOXY.ME - The patient will receive a link just prior to their visit by text.     FULL LENGTH CONSENT FOR TELE-HEALTH VISIT   I hereby voluntarily request, consent and authorize [place] and its employed or contracted physicians, physician assistants, nurse practitioners or other licensed health care professionals (the Practitioner), to provide me with telemedicine health care services (the "Services") as deemed necessary by the treating Practitioner. I acknowledge and consent to receive the Services by the Practitioner via telemedicine. I understand that the telemedicine visit will involve communicating with the Practitioner through live audiovisual communication technology and the disclosure of certain medical information by electronic transmission. I acknowledge that I have been given the opportunity to request an in-person assessment or other available alternative prior to the telemedicine visit and am voluntarily participating in the telemedicine visit.  I understand that I have the right to withhold or withdraw my consent to the use of telemedicine in the course of my care at any time, without affecting my right  to future care or treatment, and that the Practitioner or I may terminate the telemedicine visit at any time. I understand that I have the right to inspect all information obtained and/or recorded in the course of the telemedicine visit and may receive copies of available information for a reasonable fee.  I understand that some of the potential risks of receiving the Services via telemedicine include:  Marland Kitchen Delay or interruption in medical evaluation due to technological equipment failure or disruption; . Information transmitted may not be sufficient (e.g. poor resolution of images) to allow for appropriate medical decision making by the Practitioner; and/or  . In rare instances, security protocols could fail, causing a breach of personal health information.  Furthermore, I acknowledge that it is my responsibility to provide information about my medical history, conditions and care that is complete and accurate to the best of my ability. I acknowledge that Practitioner's advice, recommendations, and/or decision may be based on factors not within their control, such as incomplete or inaccurate data provided by me or distortions of diagnostic images or specimens  that may result from electronic transmissions. I understand that the practice of medicine is not an exact science and that Practitioner makes no warranties or guarantees regarding treatment outcomes. I acknowledge that I will receive a copy of this consent concurrently upon execution via email to the email address I last provided but may also request a printed copy by calling the office of [place].    I understand that my insurance will be billed for this visit.   I have read or had this consent read to me. . I understand the contents of this consent, which adequately explains the benefits and risks of the Services being provided via telemedicine.  . I have been provided ample opportunity to ask questions regarding this consent and the Services  and have had my questions answered to my satisfaction. . I give my informed consent for the services to be provided through the use of telemedicine in my medical care  By participating in this telemedicine visit I agree to the above.

## 2019-02-19 ENCOUNTER — Telehealth (INDEPENDENT_AMBULATORY_CARE_PROVIDER_SITE_OTHER): Payer: Medicare HMO | Admitting: Cardiovascular Disease

## 2019-02-19 ENCOUNTER — Encounter: Payer: Self-pay | Admitting: Cardiovascular Disease

## 2019-02-19 VITALS — BP 139/64 | HR 57 | Ht 70.0 in | Wt 175.0 lb

## 2019-02-19 DIAGNOSIS — E7800 Pure hypercholesterolemia, unspecified: Secondary | ICD-10-CM

## 2019-02-19 DIAGNOSIS — R001 Bradycardia, unspecified: Secondary | ICD-10-CM

## 2019-02-19 DIAGNOSIS — I4819 Other persistent atrial fibrillation: Secondary | ICD-10-CM

## 2019-02-19 DIAGNOSIS — E1140 Type 2 diabetes mellitus with diabetic neuropathy, unspecified: Secondary | ICD-10-CM | POA: Diagnosis not present

## 2019-02-19 DIAGNOSIS — Z7901 Long term (current) use of anticoagulants: Secondary | ICD-10-CM

## 2019-02-19 DIAGNOSIS — G4733 Obstructive sleep apnea (adult) (pediatric): Secondary | ICD-10-CM

## 2019-02-19 DIAGNOSIS — I77810 Thoracic aortic ectasia: Secondary | ICD-10-CM | POA: Diagnosis not present

## 2019-02-19 DIAGNOSIS — I5189 Other ill-defined heart diseases: Secondary | ICD-10-CM

## 2019-02-19 DIAGNOSIS — I10 Essential (primary) hypertension: Secondary | ICD-10-CM

## 2019-02-19 DIAGNOSIS — I351 Nonrheumatic aortic (valve) insufficiency: Secondary | ICD-10-CM | POA: Diagnosis not present

## 2019-02-19 NOTE — Patient Instructions (Signed)

## 2019-02-19 NOTE — Progress Notes (Signed)
Virtual Visit via Video Note   This visit type was conducted due to national recommendations for restrictions regarding the COVID-19 Pandemic (e.g. social distancing) in an effort to limit this patient's exposure and mitigate transmission in our community.  Due to his co-morbid illnesses, this patient is at least at moderate risk for complications without adequate follow up.  This format is felt to be most appropriate for this patient at this time.  All issues noted in this document were discussed and addressed.  A limited physical exam was performed with this format.  Please refer to the patient's chart for his consent to telehealth for [facility].   Date:  02/19/2019   ID:  Jose Patel, DOB [birth year], MRN [id number]  Patient Location: Home Provider Location: Other:  [facility]  PCP:  [name], NP  Cardiologist:  Thurmon Fair, MD  Electrophysiologist:  None   Evaluation Performed:  Follow-Up Visit  Chief Complaint:  Fatigue  History of Present Illness:    Jose Patel is a 85 y.o. male with a history of symptomatic paroxysmal atrial fibrillation, status post cardioversion in May 2020 and previous history of atrial letter, remote history of radiofrequency ablation.  Additional medical problems include mild-moderate aneurysm of the ascending aorta and mild-moderate aortic insufficiency active sleep apnea, hypertension, hyperlipidemia, type 2 diabetes mellitus.  He felt better after his cardioversion but continues to have fatigue.  A 30-day event monitor does not show evidence of current atrial fibrillation but there are frequent brief bursts of nonsustained ectopic atrial tachycardia.  He does have a tendency for sinus bradycardia at rest, there was no evidence of severe bradycardia or chronotropic incompetence but monitor did record a brief accelerated idioventricular rhythm, 2 very brief episodes of nonsustained VT.  Compliance with CPAP for obstructive sleep apnea is less  than 100%.  His daughter also believes that he may have some underlying depression related to moving away from his old home in [place] and his wife's dementia.  He does not have angina pectoris, denies palpitations or syncope, edema or claudication or new focal neurological complaints  The patient does not have symptoms concerning for COVID-19 infection (fever, chills, cough, or new shortness of breath).    Past Medical History:  Diagnosis Date  . A-fib (HCC)   . Anticoagulated on Coumadin    Per records from [facility]   . Aortic regurgitation   . Aortic stenosis   . CHF (congestive heart failure) (HCC)   . Coronary artery disease   . CVA (cerebral vascular accident) (HCC)    hx of CVA noted on CT from 02/19/18  . Diabetes mellitus, type 2 (HCC)   . Gastro-esophageal reflux disease without esophagitis    Per records from previous provider, [facility]  . Heart attack (HCC)   . History of CT scan of head 02/19/2018   Per records from previous provider, [facility]. Chronic changes small vessel disease  . History of ECG    03/26/14- Sinus Tachycardia @ 107 bmp, QRS 78 msec, QT 298 msec, QTc 361 msec. Per [facility] from [facility]  . History of Holter monitoring 11/08/2016   Per records from previous provider, [facility]  . Hypertension   . 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 MI (myocardial infarction) [facility])    Per records from [facility]   . Mitral regurgitation   . Mitral stenosis   . Other intervertebral disc degeneration, lumbar region    Per records from previous provider, [facility]  . Radiculopathy, lumbar region    Per records from previous provider, [facility]  . Sinus bradycardia    Per records from [facility]   . Sleep apnea   . Transient ischemic attack    10 to 12 years ago  . Typical atrial flutter (HCC)    Per records from [facility]    Past Surgical History:  Procedure Laterality Date  . CARDIAC ELECTROPHYSIOLOGY STUDY AND ABLATION    . CARDIOVERSION N/A 12/26/2018   Procedure: CARDIOVERSION;  Surgeon: Chrystie Nose, MD;  Location: [facility] ENDOSCOPY;  Service: Cardiovascular;  Laterality: N/A;  . CATARACT EXTRACTION    . Debridement to right arm with MRSA infection     4 years ago  . GALLBLADDER SURGERY     Per records from [facility]   . TEE WITHOUT CARDIOVERSION N/A 12/26/2018   Procedure: TRANSESOPHAGEAL ECHOCARDIOGRAM (TEE);  Surgeon: Chrystie Nose, MD;  Location: [facility] ENDOSCOPY;  Service: Cardiovascular;  Laterality: N/A;     Current Meds  Medication Sig  . amLODipine (NORVASC) 10 MG tablet TAKE 1 TABLET BY MOUTH EVERY DAY  . Cholecalciferol (VITAMIN D3) 125 MCG (5000 UT) TABS Take 5,000 Units by mouth daily.   Marland Kitchen donepezil (ARICEPT) 10 MG tablet 1/2 tablet by mouth daily at bedtime for 1 month then to increase to 1 tablet daily at bedtime for memory loss (Patient taking differently: Take 10 mg by mouth daily. )  . gabapentin (NEURONTIN) 100 MG capsule Take 1 capsule (100 mg total) by mouth daily as needed. (Patient taking differently: Take 100 mg by mouth at bedtime. )  . hydrALAZINE (APRESOLINE) 25 MG tablet Take 1 tablet (25 mg total) by mouth 2 (two) times daily.  Marland Kitchen losartan (COZAAR) 50 MG tablet Take 2 tablets (100 mg total) by mouth daily.  . Red Yeast Rice 600 MG CAPS Take 600 mg by mouth 2 (two) times a day.  . vitamin B-12 (CYANOCOBALAMIN) 1000 MCG tablet Take 1 tablet (1,000 mcg total) by mouth daily.  Carlena Hurl 20 MG TABS tablet TAKE 1 TABLET (20 MG TOTAL) BY MOUTH DAILY WITH SUPPER.     Allergies:   Patient has no known allergies.   Social History   Tobacco Use  . Smoking status: Former Games developer  . Smokeless tobacco: Never Used  . Tobacco comment: Quit  at age 36  Substance Use Topics  . Alcohol use: Never    Frequency: Never  . Drug use: Never     Family Hx: The patient's family history includes Arthritis in his daughter and son; Heart failure in his mother; Hyperlipidemia in his mother and son; Hypertension in his son and son.  ROS:   Please see the history of present illness.     All other systems reviewed and are negative.   Prior CV studies:   The following studies were reviewed today: Event monitor completed July 14 Abnormal event monitor due to the presence of frequent but brief episodes of ectopic atrial tachycardia.  Very rare and brief nonsustained VT and AIVR is also recorded. No evidence of atrial fibrillation or significant bradycardia events.  Echo Dec 26, 2018  1. The left ventricle has normal systolic function, with an ejection fraction of 60-65%. There is mildly increased left  ventricular wall thickness. No evidence of left ventricular regional wall motion abnormalities.  2. The right ventricle has normal systolc function. The cavity was normal. There is no increase in right ventricular wall thickness.  3. Left atrial size was moderately dilated.  4. Right atrial size was mildly dilated.  5. The mitral valve is abnormal. Mild thickening of the mitral valve leaflet.  6. The tricuspid valve was grossly normal.  7. The aortic valve is tricuspid Mild sclerosis of the aortic valve. Aortic valve regurgitation is moderate by color flow Doppler.  8. There is moderate dilatation of the ascending aorta measuring 44 mm.  Labs/Other Tests and Data Reviewed:    EKG:  An ECG dated 01/16/2019 was personally reviewed today and demonstrated:  Sinus bradycardia, otherwise normal tracing  Recent Labs: 08/07/2018: ALT 22 12/24/2018: BUN 26; Creatinine, Ser 1.12; Hemoglobin 14.2; Platelets 211; Potassium 4.9; Sodium 140; TSH 2.200   Recent Lipid Panel Lab Results  Component Value Date/Time   CHOL 115 11/11/2018 09:39 AM   TRIG  65 11/11/2018 09:39 AM   HDL 38 (L) 11/11/2018 09:39 AM   CHOLHDL 3.0 11/11/2018 09:39 AM   LDLCALC 63 11/11/2018 09:39 AM    Wt Readings from Last 3 Encounters:  02/19/19 175 lb (79.4 kg)  01/16/19 171 lb (77.6 kg)  12/26/18 169 lb 6.4 oz (76.8 kg)     Objective:    Vital Signs:  BP 139/64   Pulse (!) 57   Ht 5\' 10"  (1.778 m)   Wt 175 lb (79.4 kg)   BMI 25.11 kg/m    VITAL SIGNS:  reviewed GEN:  no acute distress EYES:  sclerae anicteric, EOMI - Extraocular Movements Intact RESPIRATORY:  normal respiratory effort, symmetric expansion CARDIOVASCULAR:  no peripheral edema SKIN:  no rash, lesions or ulcers. MUSCULOSKELETAL:  no obvious deformities. NEURO:  alert and oriented x 3, no obvious focal deficit PSYCH:  normal affect  ASSESSMENT & PLAN:    1. AFib: He has an apple watch that has not shown any sustained tachycardia.  Clinically improved following cardioversion.  Trying to avoid antiarrhythmics and AV blocking agents due to underlying bradycardia.  2. Anticoagulation: No bleeding complications.  No falls or injuries 3. Sinus bradycardia: No severe bradycardia was seen on his event monitor.  He takes donepezil which may be contributing to slower heart rates.  No clear evidence to support chronotropic incompetence.  A pacemaker may be indicated in the future, but would likely not help much at this point. 4. OSA: It's quite possible that lack of compliance with CPAP is a bigger contributor to his fatigue than bradycardia.  Encouraged 100% compliance.  5. HTN: Fair control, no changes made to his medications.  He usually forgets to take his midday dose of hydralazine. 6. Ascending aortic aneurysm: Appears to be stable in size compared with reports from 2012 and 2014 from [place].  His entire thoracic aorta was well evaluated by TEE in May 2020.  At this point I do not think there is a good indication for additional imaging procedures.  7. AI: Described as moderate on TEE.   There is no evidence of left ventricular dilation and he does not have symptoms of congestive heart failure. 8. Diastolic dysfunction: Described on echo reports, but without clinical evidence of CHF.  9. HLP: Borderline low HDL, otherwise excellent lipid parameters.  COVID-19 Education: The signs and symptoms of COVID-19 were discussed with the patient and how to seek care for testing (follow up with  PCP or arrange E-visit).  The importance of social distancing was discussed today.  Time:   Today, I have spent 17 minutes with the patient with telehealth technology discussing the above problems.     Medication Adjustments/Labs and Tests Ordered: Current medicines are reviewed at length with the patient today.  Concerns regarding medicines are outlined above.   Tests Ordered: No orders of the defined types were placed in this encounter.   Medication Changes: No orders of the defined types were placed in this encounter.   Follow Up:  Virtual Visit or In Person 6 months  Signed, Thurmon Fair, MD  02/19/2019 10:26 AM    [facility]

## 2019-03-13 ENCOUNTER — Emergency Department (HOSPITAL_BASED_OUTPATIENT_CLINIC_OR_DEPARTMENT_OTHER): Payer: Medicare HMO

## 2019-03-13 ENCOUNTER — Encounter (HOSPITAL_BASED_OUTPATIENT_CLINIC_OR_DEPARTMENT_OTHER): Payer: Self-pay

## 2019-03-13 ENCOUNTER — Other Ambulatory Visit: Payer: Self-pay

## 2019-03-13 ENCOUNTER — Emergency Department (HOSPITAL_BASED_OUTPATIENT_CLINIC_OR_DEPARTMENT_OTHER)
Admission: EM | Admit: 2019-03-13 | Discharge: 2019-03-13 | Disposition: A | Discharge status: Home / Self Care | Payer: Medicare HMO | Attending: Emergency Medicine | Admitting: Emergency Medicine

## 2019-03-13 ENCOUNTER — Telehealth: Payer: Self-pay | Admitting: Cardiovascular Disease

## 2019-03-13 DIAGNOSIS — Z7984 Long term (current) use of oral hypoglycemic drugs: Secondary | ICD-10-CM | POA: Diagnosis not present

## 2019-03-13 DIAGNOSIS — I5032 Chronic diastolic (congestive) heart failure: Secondary | ICD-10-CM | POA: Diagnosis not present

## 2019-03-13 DIAGNOSIS — I480 Paroxysmal atrial fibrillation: Secondary | ICD-10-CM | POA: Diagnosis not present

## 2019-03-13 DIAGNOSIS — I110 Hypertensive heart disease with heart failure: Secondary | ICD-10-CM | POA: Diagnosis not present

## 2019-03-13 DIAGNOSIS — E119 Type 2 diabetes mellitus without complications: Secondary | ICD-10-CM | POA: Insufficient documentation

## 2019-03-13 DIAGNOSIS — Z87891 Personal history of nicotine dependence: Secondary | ICD-10-CM | POA: Diagnosis not present

## 2019-03-13 DIAGNOSIS — Z7901 Long term (current) use of anticoagulants: Secondary | ICD-10-CM | POA: Diagnosis not present

## 2019-03-13 DIAGNOSIS — I2510 Atherosclerotic heart disease of native coronary artery without angina pectoris: Secondary | ICD-10-CM | POA: Diagnosis not present

## 2019-03-13 DIAGNOSIS — Z79899 Other long term (current) drug therapy: Secondary | ICD-10-CM | POA: Diagnosis not present

## 2019-03-13 DIAGNOSIS — R002 Palpitations: Secondary | ICD-10-CM | POA: Diagnosis not present

## 2019-03-13 LAB — CBC WITH DIFFERENTIAL/PLATELET
Abs Immature Granulocytes: 0.03 10*3/uL (ref 0.00–0.07)
Basophils Absolute: 0 10*3/uL (ref 0.0–0.1)
Basophils Relative: 0 %
Eosinophils Absolute: 0.2 10*3/uL (ref 0.0–0.5)
Eosinophils Relative: 3 %
HCT: 44.5 % (ref 39.0–52.0)
Hemoglobin: 14.4 g/dL (ref 13.0–17.0)
Immature Granulocytes: 0 %
Lymphocytes Relative: 24 %
Lymphs Abs: 1.8 10*3/uL (ref 0.7–4.0)
MCH: 30.4 pg (ref 26.0–34.0)
MCHC: 32.4 g/dL (ref 30.0–36.0)
MCV: 94.1 fL (ref 80.0–100.0)
Monocytes Absolute: 0.6 10*3/uL (ref 0.1–1.0)
Monocytes Relative: 8 %
Neutro Abs: 4.8 10*3/uL (ref 1.7–7.7)
Neutrophils Relative %: 65 %
Platelets: 230 10*3/uL (ref 150–400)
RBC: 4.73 MIL/uL (ref 4.22–5.81)
RDW: 13.9 % (ref 11.5–15.5)
WBC: 7.5 10*3/uL (ref 4.0–10.5)
nRBC: 0 % (ref 0.0–0.2)

## 2019-03-13 LAB — COMPREHENSIVE METABOLIC PANEL
ALT: 26 U/L (ref 0–44)
AST: 21 U/L (ref 15–41)
Albumin: 3.8 g/dL (ref 3.5–5.0)
Alkaline Phosphatase: 81 U/L (ref 38–126)
Anion gap: 8 (ref 5–15)
BUN: 21 mg/dL (ref 8–23)
CO2: 25 mmol/L (ref 22–32)
Calcium: 9.4 mg/dL (ref 8.9–10.3)
Chloride: 106 mmol/L (ref 98–111)
Creatinine, Ser: 1.14 mg/dL (ref 0.61–1.24)
GFR calc Af Amer: >60 mL/min (ref >60)
GFR calc non Af Amer: 59 mL/min — ABNORMAL LOW (ref >60)
Glucose, Bld: 151 mg/dL — ABNORMAL HIGH (ref 70–99)
Potassium: 4.1 mmol/L (ref 3.5–5.1)
Sodium: 139 mmol/L (ref 135–145)
Total Bilirubin: 0.7 mg/dL (ref 0.3–1.2)
Total Protein: 7 g/dL (ref 6.5–8.1)

## 2019-03-13 LAB — MAGNESIUM: Magnesium: 2.2 mg/dL (ref 1.7–2.4)

## 2019-03-13 MED ORDER — PROPOFOL 10 MG/ML IV BOLUS
0.5000 mg/kg | Freq: Once | INTRAVENOUS | Status: AC
  Administered 2019-03-13: 40 mg via INTRAVENOUS
  Filled 2019-03-13: qty 20

## 2019-03-13 NOTE — ED Provider Notes (Signed)
[place] EMERGENCY DEPARTMENT Provider Note   CSN: [id number] Arrival date & time: 03/13/19  1252     History   Chief Complaint Chief Complaint  Patient presents with  . Irregular Heart Beat    HPI Jose Patel is a 85 y.o. male.     Patient is an 85 year old male with a history of paroxysmal atrial fibrillation on Xarelto, coronary artery disease, CHF, diabetes, sinus bradycardia who does not tolerate rate control medications who is presenting today with irregular heartbeat.  Patient states he checks his heart rate several times a day and it is usually 60 or below.  Patient was fine yesterday before he went to bed but when he woke up this morning something did not feel quite right.  He denies any shortness of breath, chest pain or near syncope but states he can just feel his heart is beating funny.  When he checked his heart rate it was 108 which is very abnormal for him.  Patient has not missed any doses of his Xarelto this week and has been on Xarelto for months.  He states occasionally he will forget a dose in a week but has taken them all this week.  Patient has not had any recent medication changes and was last cardioverted in May.  He follows up with [facility] cardiology and had a virtual visit at the end of July at that time he had no complaints.  The history is provided by the patient and a relative.    Past Medical History:  Diagnosis Date  . A-fib ([place])   . Anticoagulated on Coumadin    Per records from [place]   . Aortic regurgitation   . Aortic stenosis   . CHF (congestive heart failure) ([place])   . Coronary artery disease   . CVA (cerebral vascular accident) ([place])    hx of CVA noted on CT from 02/19/18  . Diabetes mellitus, type 2 ([place])   . Gastro-esophageal reflux disease without esophagitis    Per records from previous provider, [facility]  . Heart attack ([place])   . History of CT scan of head 02/19/2018   Per records  from previous provider, [facility]. Chronic changes small vessel disease  . History of ECG    03/26/14- Sinus Tachycardia @ 107 bmp, QRS 78 msec, QT 298 msec, QTc 361 msec. Per records from [place]  . History of Holter monitoring 11/08/2016   Per records from previous provider, [facility] and [facility]  . Hypertension   . 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 MI (myocardial infarction) [facility])    Per records from [place]   . Mitral regurgitation   . Mitral stenosis   . Other intervertebral disc degeneration, lumbar region    Per records from previous provider, [facility] and [facility]  . Radiculopathy, lumbar region    Per records from previous provider, [facility] and [facility]  . Sinus bradycardia    Per records from [place]   . Sleep apnea   . Transient ischemic attack    10 to 12 years ago  . Typical atrial flutter [facility])    Per records from [place]     Patient Active Problem List   Diagnosis Date Noted  . Fatigue 01/16/2019  . Persistent atrial fibrillation   .  Vitamin D deficiency 11/20/2018  . Aortic valve regurgitation 07/07/2018  . Sinus bradycardia 07/07/2018  . Long term (current) use of anticoagulants 07/07/2018  . Diastolic dysfunction 14/97/0263  . Hypercholesteremia 07/07/2018  . Mild dilation of ascending aorta (HCC) 07/07/2018  . OSA (obstructive sleep apnea)   . Essential hypertension   . Diabetes mellitus, type 2 ([place])   . Coronary artery disease   . Paroxysmal atrial fibrillation (H[facility])   . CHF (congestive heart failure) ([place])     Past Surgical History:  Procedure Laterality Date  . CARDIAC ELECTROPHYSIOLOGY STUDY AND ABLATION    . CARDIOVERSION N/A 12/26/2018   Procedure: CARDIOVERSION;  Surgeon: Pixie Casino, MD;  Location: [facility] ENDOSCOPY;   Service: Cardiovascular;  Laterality: N/A;  . CATARACT EXTRACTION    . Debridement to right arm with MRSA infection     4 years ago  . GALLBLADDER SURGERY     Per records from [place]   . TEE WITHOUT CARDIOVERSION N/A 12/26/2018   Procedure: TRANSESOPHAGEAL ECHOCARDIOGRAM (TEE);  Surgeon: Pixie Casino, MD;  Location: [facility] ENDOSCOPY;  Service: Cardiovascular;  Laterality: N/A;        Home Medications    Prior to Admission medications   Medication Sig Start Date End Date Taking? Authorizing Provider  amLODipine (NORVASC) 10 MG tablet TAKE 1 TABLET BY MOUTH EVERY DAY 11/15/18   [name], NP  Cholecalciferol (VITAMIN D3) 125 MCG (5000 UT) TABS Take 5,000 Units by mouth daily.     [provider]  donepezil (ARICEPT) 10 MG tablet 1/2 tablet by mouth daily at bedtime for 1 month then to increase to 1 tablet daily at bedtime for memory loss Patient taking differently: Take 10 mg by mouth daily.  11/20/18   [name], NP  gabapentin (NEURONTIN) 100 MG capsule Take 1 capsule (100 mg total) by mouth daily as needed. Patient taking differently: Take 100 mg by mouth at bedtime.  11/20/18   [name], NP  hydrALAZINE (APRESOLINE) 25 MG tablet Take 1 tablet (25 mg total) by mouth 2 (two) times daily. 02/07/19   [name], NP  losartan (COZAAR) 50 MG tablet Take 2 tablets (100 mg total) by mouth daily. 02/07/19   [name], NP  Red Yeast Rice 600 MG CAPS Take 600 mg by mouth 2 (two) times a day. 01/21/19   [provider]  simvastatin (ZOCOR) 20 MG tablet Take 20 mg by mouth daily.    [provider]  vitamin B-12 (CYANOCOBALAMIN) 1000 MCG tablet Take 1 tablet (1,000 mcg total) by mouth daily. 08/19/18   [name], NP  Vitamin D, Ergocalciferol, (DRISDOL) 1.25 MG (50000 UT) CAPS capsule Take 1 capsule (50,000 Units total) by mouth every 7 (seven) days. 08/13/18   [name], NP  XARELTO 20 MG TABS tablet TAKE 1  TABLET (20 MG TOTAL) BY MOUTH DAILY WITH SUPPER. 02/10/19   [name], NP    Family History Family History  Problem Relation Age of Onset  . Heart failure Mother   . Hyperlipidemia Mother   . Hypertension Son   . Hyperlipidemia Son   . Arthritis Son   . Arthritis Daughter   . Hypertension Son     Social History Social History   Tobacco Use  . Smoking status: Former Research scientist (life sciences)  . Smokeless tobacco: Never Used  . Tobacco comment: Quit at age 48  Substance Use Topics  . Alcohol use: Never  Frequency: Never  . Drug use: Never     Allergies   Patient has no known allergies.   Review of Systems Review of Systems  All other systems reviewed and are negative.    Physical Exam Updated Vital Signs BP (!) 157/88 (BP Location: Right Arm)   Pulse 94   Temp 98.1 F (36.7 C) (Oral)   Resp 18   Ht 5\' 9"  (1.753 m)   Wt 78.5 kg   SpO2 97%   BMI 25.55 kg/m   Physical Exam Vitals signs and nursing note reviewed.  Constitutional:      General: He is not in acute distress.    Appearance: He is well-developed.  HENT:     Head: Normocephalic and atraumatic.  Eyes:     Conjunctiva/sclera: Conjunctivae normal.     Pupils: Pupils are equal, round, and reactive to light.  Neck:     Musculoskeletal: Normal range of motion and neck supple.  Cardiovascular:     Rate and Rhythm: Tachycardia present. Rhythm irregularly irregular.     Heart sounds: No murmur.  Pulmonary:     Effort: Pulmonary effort is normal. No respiratory distress.     Breath sounds: Normal breath sounds. No wheezing or rales.  Abdominal:     General: There is no distension.     Palpations: Abdomen is soft.     Tenderness: There is no abdominal tenderness. There is no guarding or rebound.  Musculoskeletal: Normal range of motion.        General: No tenderness.  Skin:    General: Skin is warm and dry.     Findings: No erythema or rash.  Neurological:     General: No focal deficit present.      Mental Status: He is alert and oriented to person, place, and time. Mental status is at baseline.  Psychiatric:        Mood and Affect: Mood normal.        Behavior: Behavior normal.        Thought Content: Thought content normal.      ED Treatments / Results  Labs (all labs ordered are listed, but only abnormal results are displayed) Labs Reviewed  COMPREHENSIVE METABOLIC PANEL - Abnormal; Notable for the following components:      Result Value   Glucose, Bld 151 (*)    GFR calc non Af Amer 59 (*)    All other components within normal limits  CBC WITH DIFFERENTIAL/PLATELET  MAGNESIUM    EKG EKG Interpretation  Date/Time:  Thursday March 13 2019 14:13:13 EDT Ventricular Rate:  37 PR Interval:    QRS Duration: 89 QT Interval:  441 QTC Calculation: 346 R Axis:   3 Text Interpretation:  Sinus bradycardia Borderline prolonged PR interval Abnormal R-wave progression, early transition Consider anterior infarct Atrial fibrillation RESOLVED SINCE PREVIOUS Confirmed by Blanchie Dessert [id number]) on 03/13/2019 2:36:58 PM   ED ECG REPORT   Date: 03/13/2019  Rate: 102  Rhythm: atrial fibrillation  QRS Axis: normal  Intervals: normal  ST/T Wave abnormalities: nonspecific T wave changes  Conduction Disutrbances:none  Narrative Interpretation:   Old EKG Reviewed: recurrent a.fib  I have personally reviewed the EKG tracing and agree with the computerized printout as noted.  Radiology Dg Chest Port 1 View  Result Date: 03/13/2019 CLINICAL DATA:  Cardiac palpitations EXAM: PORTABLE CHEST 1 VIEW COMPARISON:  November 18, 2017 FINDINGS: There is no edema or consolidation. Heart is borderline enlarged with pulmonary vascularity normal. No adenopathy. There  is aortic atherosclerosis. No bone lesions. IMPRESSION: Borderline cardiomegaly. No edema or consolidation. Aortic Atherosclerosis (ICD10-I70.0). Electronically Signed   By: Lowella Grip III M.D.   On: 03/13/2019 13:45     Procedures .Cardioversion  Date/Time: 03/13/2019 2:17 PM Performed by: Blanchie Dessert, MD Authorized by: Blanchie Dessert, MD   Consent:    Consent obtained:  Written and verbal   Consent given by:  Patient   Risks discussed:  Induced arrhythmia and death   Alternatives discussed:  Anti-coagulation medication and rate-control medication Pre-procedure details:    Cardioversion basis:  Elective   Rhythm:  Atrial fibrillation Patient sedated: Yes. Refer to sedation procedure documentation for details of sedation.  Attempt one:    Cardioversion mode:  Synchronous   Waveform:  Biphasic   Shock (Joules):  120   Shock outcome:  No change in rhythm Attempt two:    Cardioversion mode:  Synchronous   Waveform:  Biphasic   Shock (Joules):  200   Shock outcome:  Conversion to normal sinus rhythm Post-procedure details:    Patient status:  Awake   Patient tolerance of procedure:  Tolerated well, no immediate complications .Sedation  Date/Time: 03/13/2019 2:18 PM Performed by: Blanchie Dessert, MD Authorized by: Blanchie Dessert, MD   Consent:    Consent obtained:  Verbal   Consent given by:  Patient   Risks discussed:  Allergic reaction, dysrhythmia, inadequate sedation, nausea, prolonged hypoxia resulting in organ damage, prolonged sedation necessitating reversal, respiratory compromise necessitating ventilatory assistance and intubation and vomiting   Alternatives discussed:  Analgesia without sedation, anxiolysis and regional anesthesia Universal protocol:    Procedure explained and questions answered to patient or proxy's satisfaction: yes     Relevant documents present and verified: yes     Test results available and properly labeled: yes     Imaging studies available: yes     Required blood products, implants, devices, and special equipment available: yes     Site/side marked: yes     Immediately prior to procedure a time out was called: yes     Patient identity  confirmation method:  Verbally with patient Indications:    Procedure performed:  Cardioversion   Procedure necessitating sedation performed by:  Physician performing sedation Pre-sedation assessment:    Time since last food or drink:  8am   ASA classification: class 2 - patient with mild systemic disease     Neck mobility: normal     Mouth opening:  3 or more finger widths   Thyromental distance:  4 finger widths   Mallampati score:  I - soft palate, uvula, fauces, pillars visible   Pre-sedation assessments completed and reviewed: airway patency, cardiovascular function, hydration status, mental status, nausea/vomiting, pain level, respiratory function and temperature     Pre-sedation assessment completed:  03/13/2019 1:19 PM Immediate pre-procedure details:    Reassessment: Patient reassessed immediately prior to procedure     Reviewed: vital signs, relevant labs/tests and NPO status     Verified: bag valve mask available, emergency equipment available, intubation equipment available, IV patency confirmed, oxygen available and suction available   Procedure details (see MAR for exact dosages):    Preoxygenation:  Nasal cannula   Sedation:  Propofol   Analgesia:  None   Intra-procedure monitoring:  Blood pressure monitoring, cardiac monitor, continuous pulse oximetry, frequent LOC assessments, frequent vital sign checks and continuous capnometry   Intra-procedure events: none     Total Provider sedation time (minutes):  15 Post-procedure details:  Post-sedation assessment completed:  03/13/2019 2:19 PM   Attendance: Constant attendance by certified staff until patient recovered     Recovery: Patient returned to pre-procedure baseline     Post-sedation assessments completed and reviewed: airway patency, cardiovascular function, hydration status, mental status, nausea/vomiting, pain level, respiratory function and temperature     Patient is stable for discharge or admission: yes      Patient tolerance:  Tolerated well, no immediate complications   (including critical care time)  Medications Ordered in ED Medications - No data to display   Initial Impression / Assessment and Plan / ED Course  I have reviewed the triage vital signs and the nursing notes.  Pertinent labs & imaging results that were available during my care of the patient were reviewed by me and considered in my medical decision making (see chart for details).        Elderly male presenting today with recurrent atrial fibrillation.  Patient is anticoagulated and symptoms just started this morning.  Patient checks his heart rate regularly and was in sinus rhythm last night.  Other than being aware of the irregular heartbeat patient has no other complaints.  He is well-appearing and blood pressure is stable.  Labs without significant findings.  Patient chooses to be cardioverted here in the follow-up with him cardiology.  2:17 PM Initial cardioversion at 120 J was not effective and patient then cardioverted at 200 with return to sinus bradycardia in the 40s.  Patient tolerated procedure well.  Patient's heart rate is typically between 40-60.   2:39 PM Pt now if feeling great and ambulating without difficulty and requesting to go home.  Pt will follow up with Dr. Loletha Grayer for ongoing care. Final Clinical Impressions(s) / ED Diagnoses   Final diagnoses:  Paroxysmal atrial fibrillation [facility])    ED Discharge Orders    None       Blanchie Dessert, MD 03/13/19 1442

## 2019-03-13 NOTE — Telephone Encounter (Signed)
Spoke with pt's daughter who report pt woke up this morning feeling like his heart was racing and feeling weak. Daughter checked BP and HR. BP  113/69 HR ranging between 100-110 at rest.  Daughter report last time pt had this feeling, he ended up having a syncopal episode.  Dr. Margaretann Loveless made aware and recommend pt report to ER for further evaluations as pt also has a hx of bradycardia. Pt is currently not taking anything for rate control and MD doesn't want to add anything additional without being able to monitor pt.   Daughter voiced understanding.

## 2019-03-13 NOTE — Sedation Documentation (Signed)
Pt in sinus rhythm at this time

## 2019-03-13 NOTE — Telephone Encounter (Signed)
STAT if HR is under 50 or over 120 (normal HR is 60-100 beats per minute)  1) What is your heart rate? 100 to 110   2) Do you have a log of your heart rate readings (document readings)? No log, but wears Apple Watch to track it  3) Do you have any other symptoms? Feel weak

## 2019-03-13 NOTE — ED Notes (Signed)
Pt able to ambulate.  

## 2019-03-13 NOTE — Sedation Documentation (Addendum)
120J shock administered.

## 2019-03-13 NOTE — Discharge Instructions (Addendum)
Continue to take your Xarelto as prescribed.  Do not miss any doses.  Continue to monitor your heart rate the way you do at home already.  If you start feeling poorly your heart starts racing and please return.

## 2019-03-13 NOTE — Sedation Documentation (Addendum)
200J shock administered

## 2019-03-13 NOTE — ED Notes (Signed)
X-ray at bedside

## 2019-03-13 NOTE — ED Triage Notes (Signed)
Pt c/o irregular heart rate x today-hx of Afib with cardioversion in May-NAD-steady gait

## 2019-03-13 NOTE — Sedation Documentation (Signed)
Seth Bake, RN, Shelda Pal, RN, and [place], EDP at bedside.

## 2019-03-17 ENCOUNTER — Telehealth: Payer: Self-pay | Admitting: *Deleted

## 2019-03-17 NOTE — Telephone Encounter (Signed)
Follow up ° ° °Patient's daughter is returning your call. Please call. °

## 2019-03-17 NOTE — Telephone Encounter (Signed)
Virtual appointment made for 8/26 at 3:20 with Dr. Sallyanne Kuster.

## 2019-03-17 NOTE — Telephone Encounter (Signed)
Left a message for the patient to call back to schedule a virtual follow appointment from his recent ED visit.

## 2019-03-21 ENCOUNTER — Other Ambulatory Visit: Payer: Self-pay | Admitting: Nurse Practitioner

## 2019-03-21 DIAGNOSIS — I10 Essential (primary) hypertension: Secondary | ICD-10-CM

## 2019-03-26 ENCOUNTER — Telehealth: Payer: Self-pay

## 2019-03-26 ENCOUNTER — Telehealth (INDEPENDENT_AMBULATORY_CARE_PROVIDER_SITE_OTHER): Payer: Medicare HMO | Admitting: Cardiovascular Disease

## 2019-03-26 ENCOUNTER — Encounter: Payer: Self-pay | Admitting: Cardiovascular Disease

## 2019-03-26 ENCOUNTER — Other Ambulatory Visit: Payer: Self-pay

## 2019-03-26 VITALS — BP 167/65 | HR 51 | Ht 69.5 in | Wt 174.0 lb

## 2019-03-26 DIAGNOSIS — I495 Sick sinus syndrome: Secondary | ICD-10-CM | POA: Diagnosis not present

## 2019-03-26 DIAGNOSIS — E785 Hyperlipidemia, unspecified: Secondary | ICD-10-CM

## 2019-03-26 DIAGNOSIS — I351 Nonrheumatic aortic (valve) insufficiency: Secondary | ICD-10-CM

## 2019-03-26 DIAGNOSIS — I77810 Thoracic aortic ectasia: Secondary | ICD-10-CM | POA: Diagnosis not present

## 2019-03-26 DIAGNOSIS — I10 Essential (primary) hypertension: Secondary | ICD-10-CM | POA: Diagnosis not present

## 2019-03-26 DIAGNOSIS — Z7901 Long term (current) use of anticoagulants: Secondary | ICD-10-CM | POA: Diagnosis not present

## 2019-03-26 DIAGNOSIS — G4733 Obstructive sleep apnea (adult) (pediatric): Secondary | ICD-10-CM

## 2019-03-26 DIAGNOSIS — I480 Paroxysmal atrial fibrillation: Secondary | ICD-10-CM

## 2019-03-26 MED ORDER — METOPROLOL TARTRATE 25 MG PO TABS: 25.0000 mg | ORAL_TABLET | ORAL | 0 refills | Status: DC | PRN

## 2019-03-26 NOTE — Progress Notes (Signed)
Virtual Visit via Telephone Note   This visit type was conducted due to national recommendations for restrictions regarding the COVID-19 Pandemic (e.g. social distancing) in an effort to limit this patient's exposure and mitigate transmission in our community.  Due to his co-morbid illnesses, this patient is at least at moderate risk for complications without adequate follow up.  This format is felt to be most appropriate for this patient at this time.  The patient did not have access to video technology/had technical difficulties with video requiring transitioning to audio format only (telephone).  All issues noted in this document were discussed and addressed.  No physical exam could be performed with this format.  Please refer to the patient's chart for his  consent to telehealth for [facility].   Date:  03/26/2019   ID:  Jose Patel, DOB 1933-10-20, MRN [id number]  Patient Location: Home Provider Location: Home  PCP:  [name], NP  Cardiologist:  Thurmon Fair, MD  Electrophysiologist:  None   Evaluation Performed:  Follow-Up Visit  Chief Complaint:  AFib  History of Present Illness:    Jose Patel is a 85 y.o. male with paroxysmal atrial fibrillation, recently seen in the emergency room where he underwent an semiurgent/elective electrical cardioversion atrial fibrillation on August 13.  He has memory problems and his daughter helped with the phone conversation on a three-way conference.  He woke up with palpitations that persisted for several hours during the day.  He did not have shortness of breath, angina or near syncope/syncope.  He was advised to go to the emergency room where his evaluation was relatively benign with the exception of atrial fibrillation with rapid ventricular response at a rate of about 110 bpm.  He underwent cardioversion with prompt return to sinus rhythm and was discharged from the emergency room later the same day.  This is his second  cardioversion in the last 3 months.  He is not on chronic antiarrhythmic therapy or rate control medications due to persistent sinus bradycardia which is likely age-related.  He does take a cholinesterase inhibitor for early Alzheimer's dementia, which may contribute to his bradycardia.  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    Past Medical History:  Diagnosis Date  . A-fib (HCC)   . Anticoagulated on Coumadin    Per records from [facility]   . Aortic regurgitation   . Aortic stenosis   . CHF (congestive heart failure) (HCC)   . Coronary artery disease   . CVA (cerebral vascular accident) (HCC)    hx of CVA noted on CT from 02/19/18  . Diabetes mellitus, type 2 (HCC)   . Gastro-esophageal reflux disease without esophagitis    Per records from previous provider, [facility]  . Heart attack (HCC)   . History of CT scan of head 02/19/2018   Per records from previous provider, [facility]. Chronic changes small vessel disease  . History of ECG    03/26/14- Sinus Tachycardia @ 107 bmp, QRS 78 msec, QT 298 msec, QTc 361 msec. Per records from [facility]  . History of Holter monitoring 11/08/2016  Per records from previous provider, [facility]  . Hypertension   . 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   . MI (myocardial infarction) [facility])    Per records  from [facility]   . Mitral regurgitation   . Mitral stenosis   . Other intervertebral disc degeneration, lumbar region    Per records from previous provider, [facility]  . Radiculopathy, lumbar region    Per records from previous provider, [facility]  . Sinus bradycardia    Per records from [facility]   . Sleep apnea   . Transient ischemic attack    10 to 12 years ago  . Typical atrial flutter (HCC)    Per records from [facility]    Past Surgical History:  Procedure Laterality Date  . CARDIAC ELECTROPHYSIOLOGY STUDY AND ABLATION    . CARDIOVERSION N/A 12/26/2018   Procedure: CARDIOVERSION;  Surgeon: Chrystie Nose, MD;  Location: [facility] ENDOSCOPY;  Service: Cardiovascular;  Laterality: N/A;  . CATARACT EXTRACTION    . Debridement to right arm with MRSA infection     4 years ago  . GALLBLADDER SURGERY     Per records from [facility]   . TEE WITHOUT CARDIOVERSION N/A 12/26/2018   Procedure: TRANSESOPHAGEAL ECHOCARDIOGRAM (TEE);  Surgeon: Chrystie Nose, MD;  Location: [facility] ENDOSCOPY;  Service: Cardiovascular;  Laterality: N/A;     Current Meds  Medication Sig  . amLODipine (NORVASC) 10 MG tablet TAKE 1 TABLET BY MOUTH EVERY DAY  . Cholecalciferol (VITAMIN D3) 125 MCG (5000 UT) TABS Take 5,000 Units by mouth daily.   Marland Kitchen gabapentin (NEURONTIN) 100 MG capsule Take 1 capsule (100 mg total) by mouth daily as needed. (Patient taking differently: Take 100 mg by mouth at bedtime. )  . hydrALAZINE (APRESOLINE) 25 MG tablet Take 1 tablet (25 mg total) by mouth 2 (two) times daily.  Marland Kitchen losartan (COZAAR) 50 MG tablet Take 2 tablets (100 mg total) by mouth daily.  . Red Yeast Rice 600 MG CAPS Take 600 mg by mouth 2 (two) times a day.  . vitamin B-12 (CYANOCOBALAMIN) 1000 MCG tablet Take 1 tablet (1,000 mcg total) by mouth daily.  Carlena Hurl 20 MG TABS tablet TAKE 1 TABLET (20 MG TOTAL) BY MOUTH DAILY WITH SUPPER.     Allergies:    Patient has no known allergies.   Social History   Tobacco Use  . Smoking status: Former Games developer  . Smokeless tobacco: Never Used  . Tobacco comment: Quit at age 42  Substance Use Topics  . Alcohol use: Never    Frequency: Never  . Drug use: Never     Family Hx: The patient's family history includes Arthritis in his daughter and son; Heart failure in his mother; Hyperlipidemia in his mother and son; Hypertension in his son and son.  ROS:   Please see the history of present illness.     All other systems reviewed and are negative.   Prior CV studies:   The following studies were reviewed today: Notes from emergency department visit on August 13, labs and ECG  Labs/Other Tests and Data Reviewed:    EKG:  Several ECGs were reviewed from March 13, 2019.  The initial ECG shows atrial fibrillation with mild rapid ventricular response at 102 bpm, the second ECG performed an hour later shows  improved ventricular rate control at 92 bpm.  Long sedation and cardioversion the rhythm was severe sinus bradycardia at 37 bpm. The corrected QT interval was around 490 ms  Recent Labs: 12/24/2018: TSH 2.200 03/13/2019: ALT 26; BUN 21; Creatinine, Ser 1.14; Hemoglobin 14.4; Magnesium 2.2; Platelets 230; Potassium 4.1; Sodium 139   Recent Lipid Panel Lab Results  Component Value Date/Time   CHOL 115 11/11/2018 09:39 AM   TRIG 65 11/11/2018 09:39 AM   HDL 38 (L) 11/11/2018 09:39 AM   CHOLHDL 3.0 11/11/2018 09:39 AM   LDLCALC 63 11/11/2018 09:39 AM    Wt Readings from Last 3 Encounters:  03/26/19 174 lb (78.9 kg)  03/13/19 173 lb (78.5 kg)  02/19/19 175 lb (79.4 kg)     Objective:    Vital Signs:  BP (!) 167/65   Pulse (!) 51   Ht 5' 9.5" (1.765 m)   Wt 174 lb (78.9 kg)   BMI 25.33 kg/m    VITAL SIGNS:  reviewed Unable to examine  ASSESSMENT & PLAN:    1. Parox AFib: This is a second cardioversion in 3 months.  Pharmacological therapy is limited by marked baseline sinus  bradycardia.  Immediately post cardioversion his heart rate was 37 bpm.  Today without any sedation heart rate is 51 bpm.  On the other hand he is only very mildly symptomatic when he has atrial fibrillation.  I recommended a low dose of metoprolol tartrate to be taken as needed when he has arrhythmia with rapid ventricular rate.  Unless he has dyspnea/angina/syncope he can delay going to the emergency room since the arrhythmia may terminate spontaneously.  However, if he becomes more seriously symptomatic going to the emergency room for another cardioversion is the right approach.  He should continue anticoagulation. 2.  SSS: He has severe, but asymptomatic bradycardia due to sinus node dysfunction (probably compounded by the use of donepezil).  We reviewed the fact that he may require pacemaker implantation in the future, if either he becomes symptomatic or we are compelled to use more aggressive pharmacological therapy for his atrial fibrillation. 3. Anticoagulation: Tolerating Xarelto without bleeding complications and without falls or injuries. 4. OSA: Urged 100% compliance with CPAP since this will lessen the likelihood of recurrent atrial fibrillation. 5. HLP: LDL cholesterol in target range, mildly decreased HDL cholesterol.  Continue statin. 6. HTN: Mildly elevated today, but usually lower according to his family.  Avoid any medications with negative chronotropic effect.  He is already taking losartan and maximum dose, amlodipine and hydralazine.  Borderline renal insufficiency, trying to avoid diuretics. 7. Asc Ao aneurysm/AI: Other than good blood pressure control, no additional intervention planned at this time   COVID-19 Education: The signs and symptoms of COVID-19 were discussed with the patient and how to seek care for testing (follow up with PCP or arrange E-visit).  The importance of social distancing was discussed today.  Time:   Today, I have spent 26 minutes with the patient with  telehealth technology discussing the above problems.     Medication Adjustments/Labs and Tests Ordered: Current medicines are reviewed at length with the patient today.  Concerns regarding medicines are outlined above.   Tests Ordered: No orders of the defined types were placed in this encounter.   Medication Changes: Meds ordered this encounter  Medications  . metoprolol tartrate (LOPRESSOR) 25 MG tablet    Sig: Take 1 tablet (25 mg total) by mouth as needed (Once daily as needed for a heart rate  over 100 bpm.).    Dispense:  20 tablet    Refill:  0   Patient Instructions  Medication Instructions:  TAKE: Metoprolol 25 mg once daily as needed for a heart rate over 100 bpm.  If you need a refill on your cardiac medications before your next appointment, please call your pharmacy.   Lab work: None ordered If you have labs (blood work) drawn today and your tests are completely normal, you will receive your results only by: Marland Kitchen MyChart Message (if you have MyChart) OR . A paper copy in the mail If you have any lab test that is abnormal or we need to change your treatment, we will call you to review the results.  Testing/Procedures: None ordered  Follow-Up: At [facility], you and your health needs are our priority.  As part of our continuing mission to provide you with exceptional heart care, we have created designated Provider Care Teams.  These Care Teams include your primary Cardiologist (physician) and Advanced Practice Providers (APPs -  Physician Assistants and Nurse Practitioners) who all work together to provide you with the care you need, when you need it. You will need a follow up appointment in 3 months in office.  Please call our office 2 months in advance to schedule this appointment.  You may see Thurmon Fair, MD or one of the following Advanced Practice Providers on your designated Care Team: [place], [place] . Micah Flesher, PA-C    Follow Up:  Virtual Visit or In  Person 3 months  Signed, Thurmon Fair, MD  03/26/2019 7:23 PM    [facility]

## 2019-03-26 NOTE — Patient Instructions (Signed)
Medication Instructions:  TAKE: Metoprolol 25 mg once daily as needed for a heart rate over 100 bpm.  If you need a refill on your cardiac medications before your next appointment, please call your pharmacy.   Lab work: None ordered If you have labs (blood work) drawn today and your tests are completely normal, you will receive your results only by: Marland Kitchen MyChart Message (if you have MyChart) OR . A paper copy in the mail If you have any lab test that is abnormal or we need to change your treatment, we will call you to review the results.  Testing/Procedures: None ordered  Follow-Up: At [facility], you and your health needs are our priority.  As part of our continuing mission to provide you with exceptional heart care, we have created designated Provider Care Teams.  These Care Teams include your primary Cardiologist (physician) and Advanced Practice Providers (APPs -  Physician Assistants and Nurse Practitioners) who all work together to provide you with the care you need, when you need it. You will need a follow up appointment in 3 months in office.  Please call our office 2 months in advance to schedule this appointment.  You may see [name], MD or one of the following Advanced Practice Providers on your designated Care Team: [place], [place] . Fabian Sharp, PA-C

## 2019-03-26 NOTE — Telephone Encounter (Signed)
Left message for patient to call office back to prechart before 3:20 virtual visit with Dr. Sallyanne Kuster today.

## 2019-03-31 ENCOUNTER — Other Ambulatory Visit: Payer: Self-pay | Admitting: Nurse Practitioner

## 2019-03-31 DIAGNOSIS — I10 Essential (primary) hypertension: Secondary | ICD-10-CM

## 2019-03-31 NOTE — Telephone Encounter (Signed)
Patient with different instructions. RX for hydralazine last filled 02/07/2019 with 1 additional refill (6 month supply)

## 2019-04-03 IMAGING — DX DG CHEST 2V
2 series · 2 of 2 positions shown · non-contrast
Comparison: None.

CLINICAL DATA: Nausea and vomiting

EXAM:
CHEST - 2 VIEW

[chest pa]
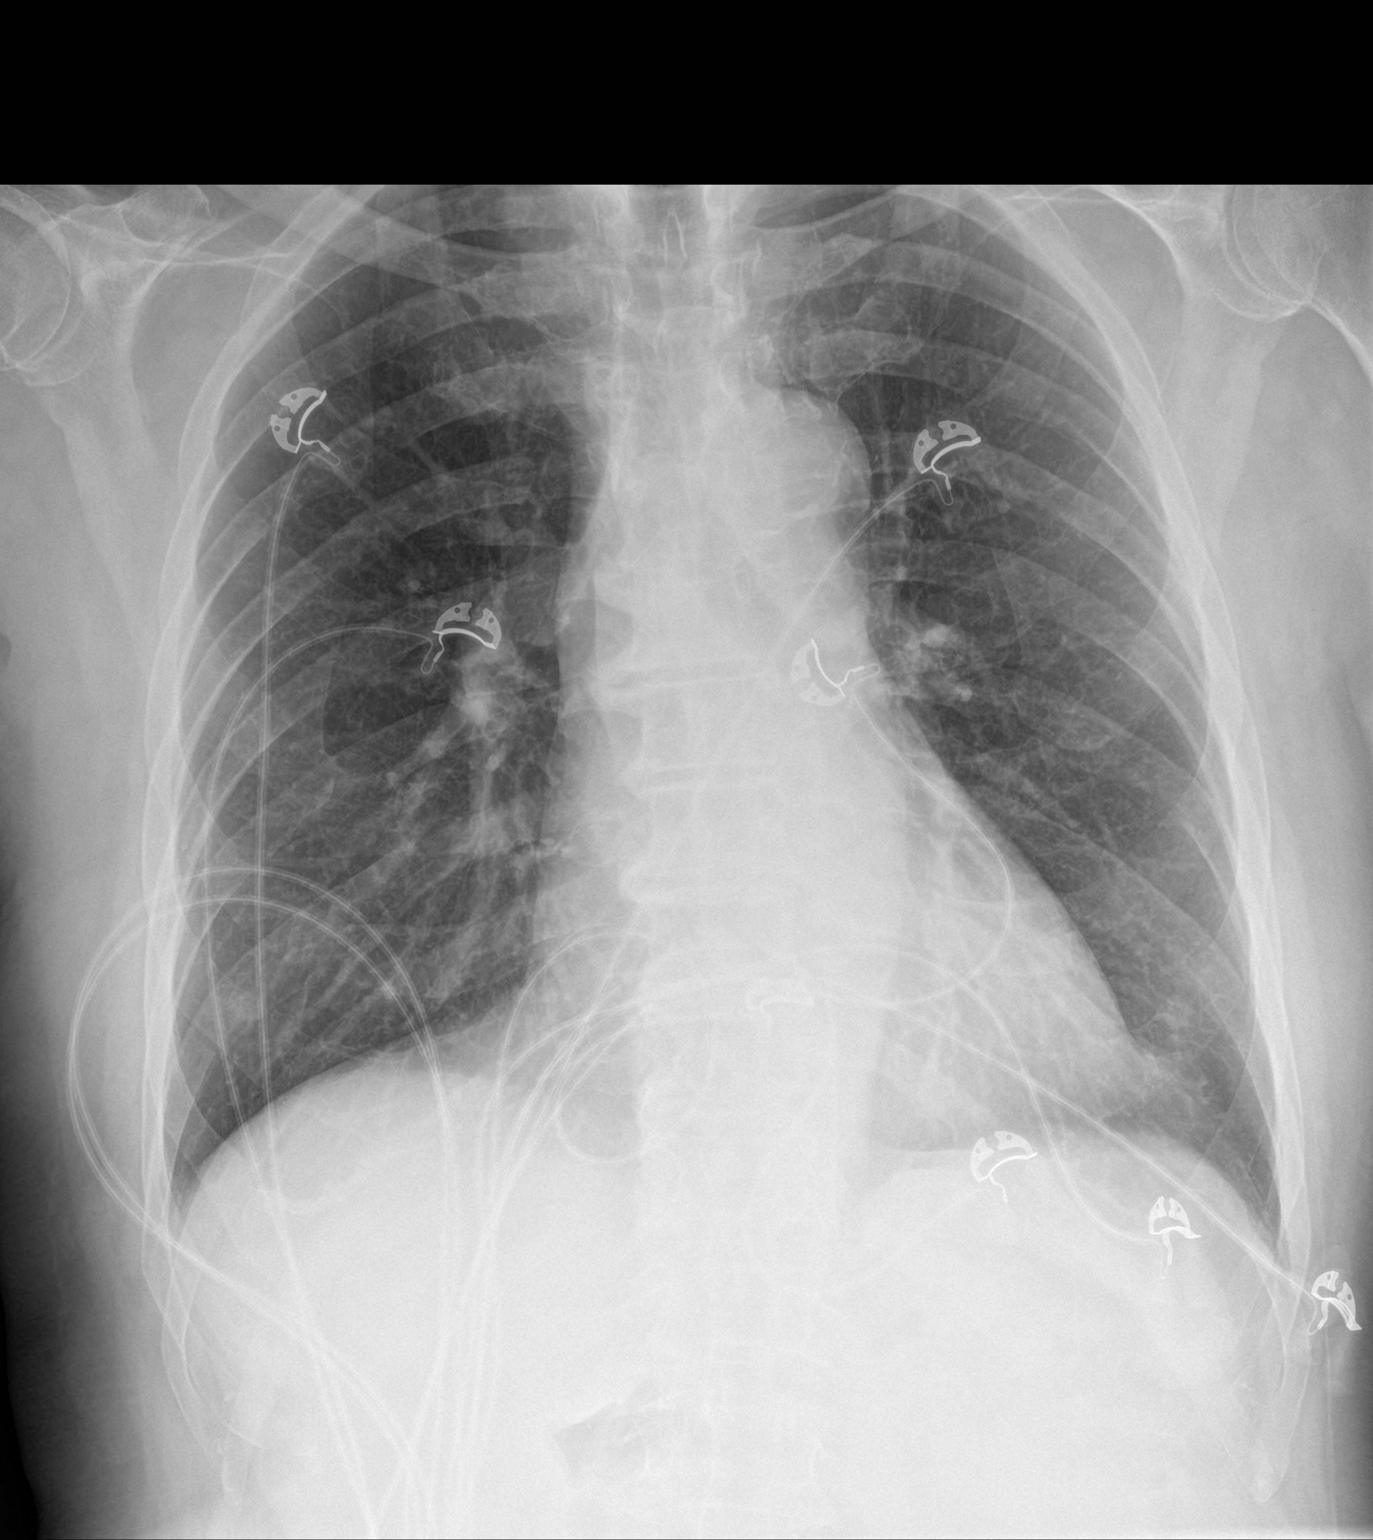

[chest lat]
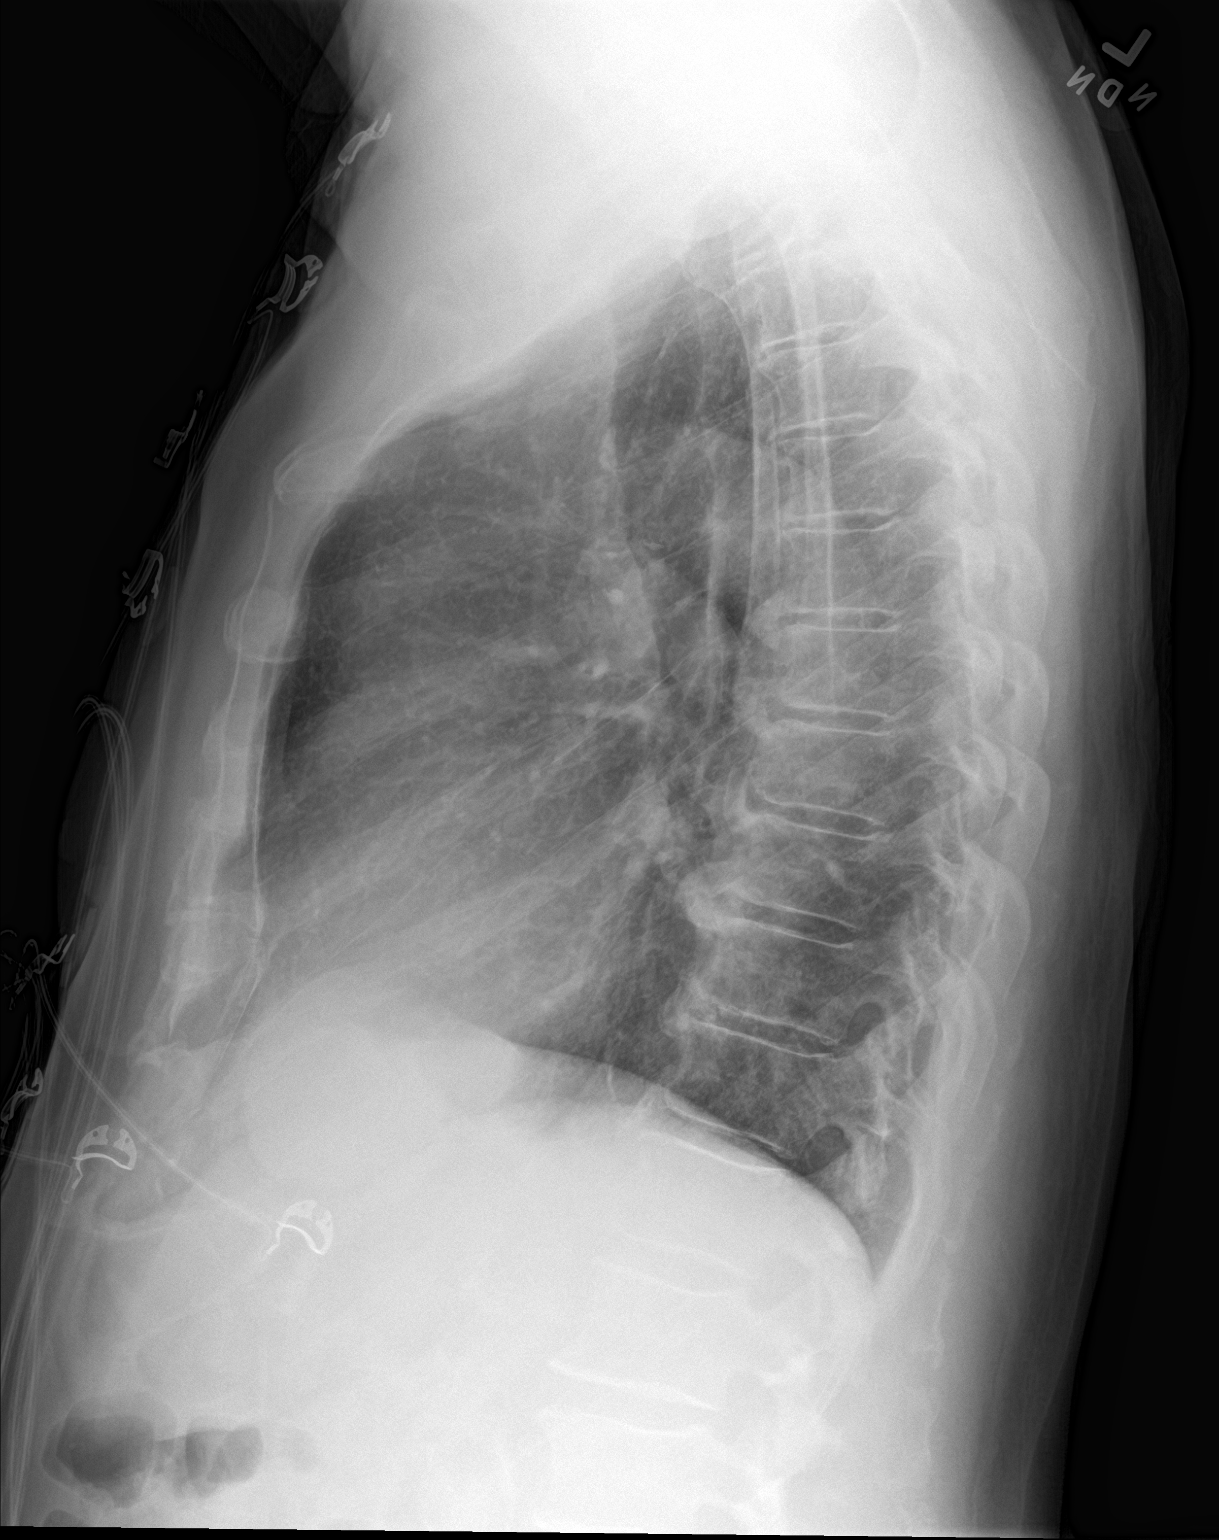

[2 of 2 positions shown; findings below may reference images not displayed]

FINDINGS: Cardiac shadow is within normal limits. Aortic calcifications are
noted. The lungs are well aerated bilaterally. Bilateral nipple
shadows are seen. Degenerative changes of the thoracic spine are
noted. No focal infiltrate is seen.
IMPRESSION: No acute abnormality noted.

## 2019-04-17 DIAGNOSIS — R69 Illness, unspecified: Secondary | ICD-10-CM | POA: Diagnosis not present

## 2019-04-18 ENCOUNTER — Other Ambulatory Visit: Payer: Self-pay

## 2019-04-18 ENCOUNTER — Encounter: Payer: Self-pay | Admitting: Nurse Practitioner

## 2019-04-18 ENCOUNTER — Ambulatory Visit (INDEPENDENT_AMBULATORY_CARE_PROVIDER_SITE_OTHER): Payer: Medicare HMO | Admitting: Nurse Practitioner

## 2019-04-18 VITALS — BP 132/70 | HR 47 | Temp 98.6°F | Ht 70.0 in | Wt 174.0 lb

## 2019-04-18 DIAGNOSIS — I5189 Other ill-defined heart diseases: Secondary | ICD-10-CM | POA: Diagnosis not present

## 2019-04-18 DIAGNOSIS — F0390 Unspecified dementia without behavioral disturbance: Secondary | ICD-10-CM

## 2019-04-18 DIAGNOSIS — Z23 Encounter for immunization: Secondary | ICD-10-CM | POA: Diagnosis not present

## 2019-04-18 DIAGNOSIS — R531 Weakness: Secondary | ICD-10-CM

## 2019-04-18 DIAGNOSIS — E559 Vitamin D deficiency, unspecified: Secondary | ICD-10-CM

## 2019-04-18 DIAGNOSIS — G629 Polyneuropathy, unspecified: Secondary | ICD-10-CM

## 2019-04-18 DIAGNOSIS — R69 Illness, unspecified: Secondary | ICD-10-CM | POA: Diagnosis not present

## 2019-04-18 DIAGNOSIS — E538 Deficiency of other specified B group vitamins: Secondary | ICD-10-CM

## 2019-04-18 DIAGNOSIS — I480 Paroxysmal atrial fibrillation: Secondary | ICD-10-CM

## 2019-04-18 DIAGNOSIS — M545 Low back pain, unspecified: Secondary | ICD-10-CM

## 2019-04-18 DIAGNOSIS — I10 Essential (primary) hypertension: Secondary | ICD-10-CM | POA: Diagnosis not present

## 2019-04-18 DIAGNOSIS — E1140 Type 2 diabetes mellitus with diabetic neuropathy, unspecified: Secondary | ICD-10-CM

## 2019-04-18 MED ORDER — NAMENDA XR TITRATION PACK 7 & 14 & 21 &28 MG PO CP24: ORAL_CAPSULE | ORAL | 0 refills | Status: DC

## 2019-04-18 NOTE — Progress Notes (Signed)
Careteam: Patient Care Team: [name], NP as PCP - General (Geriatric Medicine) [name], MD as PCP - Cardiology (Cardiology) Center, [facility] (Gastroenterology) [name], MD as Consulting Physician  Advanced Directive information    No Known Allergies  Chief Complaint  Patient presents with  . Medical Management of Chronic Issues    5 month follow-up. Here with daughter   . Immunizations    Discuss need for PNA. Flu vaccine given today   . Quality Metric Gaps    Discuss need for eye exam. Foot exam due      HPI: Patient is a 85 y.o. male seen in the office today for routine follow up.   Had a passing out episode, was transferred to [facility]. Offered cardioversion but he decline because he was alone. So had it scheduled with daughter. Had another event in august and got cardioversion in ED. Now following closely with cardiology, was instructed to take extra dose of metoprolol and if this does not work to call office. HR is low and therefore only uses metoprolol as needed. Question a pacemaker at some point, not there yet.   4 days ago was painting and feel on concentrate. Daughter has been evaluating and dressing. No concerns on infection at this time. Continues on xarelto for anticoaguation  OSA- does not wear CPAP all the time due to getting up at night taking care of his wife.   htn- controlled on norvasc, cozaar and hydralazine. Only taking hydralazine 25 mg twice daily  Memory loss- attempted Aricept and tolerated at 1/2 tablet which he tolerated but at 1 tablet was having vomiting. Taking supplements that daughter gave him  DM- no medication, diet controlled. On losartan for renal protection.   Increase in urination - recent PSA normal. Daughter questions if it is when his sugar is high  Review of Systems:  Review of Systems  Constitutional: Negative for chills, fever and weight loss.  Respiratory: Negative for sputum production and  shortness of breath.   Cardiovascular: Negative for chest pain, palpitations and leg swelling.  Gastrointestinal: Negative for constipation and diarrhea.  Genitourinary: Negative for dysuria, frequency and urgency.       Gets up once to urinate at night  Musculoskeletal: Positive for back pain, falls (multipe falls) and myalgias. Negative for joint pain.  Skin: Negative for itching and rash.  Neurological: Positive for tingling, sensory change and weakness. Negative for dizziness and headaches.  Psychiatric/Behavioral: Positive for memory loss. Negative for depression. The patient is not nervous/anxious and does not have insomnia.     Past Medical History:  Diagnosis Date  . A-fib ([place])   . Anticoagulated on Coumadin    Per records from [place]   . Aortic regurgitation   . Aortic stenosis   . CHF (congestive heart failure) ([place])   . Coronary artery disease   . CVA (cerebral vascular accident) ([place])    hx of CVA noted on CT from 02/19/18  . Diabetes mellitus, type 2 ([place])   . Gastro-esophageal reflux disease without esophagitis    Per records from previous provider, [facility]  . Heart attack ([place])   . History of CT scan of head 02/19/2018   Per records from previous provider, [facility]. Chronic changes small vessel disease  . History of ECG    03/26/14- Sinus Tachycardia @ 107 bmp, QRS 78 msec, QT 298 msec, QTc 361 msec. Per records from  [place]  . History of Holter monitoring 11/08/2016   Per records from previous provider, [facility]  . Hypertension   . 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   . MI (myocardial infarction) [facility])    Per records from [place]   . Mitral regurgitation   . Mitral stenosis   . Other intervertebral disc degeneration, lumbar region    Per  records from previous provider, [facility] and [facility]  . Radiculopathy, lumbar region    Per records from previous provider, [facility] and [facility]  . Sinus bradycardia    Per records from [place]   . Sleep apnea   . Transient ischemic attack    10 to 12 years ago  . Typical atrial flutter ([place])    Per records from [place]    Past Surgical History:  Procedure Laterality Date  . CARDIAC ELECTROPHYSIOLOGY STUDY AND ABLATION    . CARDIOVERSION N/A 12/26/2018   Procedure: CARDIOVERSION;  Surgeon: Pixie Casino, MD;  Location: [facility] ENDOSCOPY;  Service: Cardiovascular;  Laterality: N/A;  . CATARACT EXTRACTION    . Debridement to right arm with MRSA infection     4 years ago  . GALLBLADDER SURGERY     Per records from [place]   . TEE WITHOUT CARDIOVERSION N/A 12/26/2018   Procedure: TRANSESOPHAGEAL ECHOCARDIOGRAM (TEE);  Surgeon: Pixie Casino, MD;  Location: [facility] ENDOSCOPY;  Service: Cardiovascular;  Laterality: N/A;   Social History:   reports that he has quit smoking. He has never used smokeless tobacco. He reports that he does not drink alcohol or use drugs.  Family History  Problem Relation Age of Onset  . Heart failure Mother   . Hyperlipidemia Mother   . Hypertension Son   . Hyperlipidemia Son   . Arthritis Son   . Arthritis Daughter   . Hypertension Son     Medications: Patient's Medications  New Prescriptions   No medications on file  Previous Medications   AMLODIPINE (NORVASC) 10 MG TABLET    TAKE 1 TABLET BY MOUTH EVERY DAY   CHOLECALCIFEROL (VITAMIN D3) 125 MCG (5000 UT) TABS    Take 5,000 Units by mouth daily.    GABAPENTIN (NEURONTIN) 100 MG CAPSULE    Take 1 capsule (100 mg total) by mouth daily as needed.   HYDRALAZINE (APRESOLINE) 25 MG TABLET    Take 1 tablet (25 mg total) by mouth 2 (two) times daily.   LOSARTAN (COZAAR) 50 MG TABLET    Take 2 tablets (100 mg total) by mouth daily.   MELATONIN 5 MG TABS     Take 1 tablet by mouth at bedtime.   METOPROLOL TARTRATE (LOPRESSOR) 25 MG TABLET    Take 1 tablet (25 mg total) by mouth as needed (Once daily as needed for a heart rate over 100 bpm.).   RED YEAST RICE 600 MG CAPS    Take 600 mg by mouth 2 (two) times a day.   VITAMIN B-12 (CYANOCOBALAMIN) 1000 MCG TABLET    Take 1 tablet (1,000 mcg total) by mouth daily.   VITAMIN D, ERGOCALCIFEROL, (DRISDOL) 1.25 MG (50000 UT) CAPS CAPSULE    Take 1 capsule (50,000 Units total) by mouth every 7 (seven) days.   XARELTO 20 MG TABS TABLET    TAKE 1 TABLET (20 MG TOTAL) BY MOUTH DAILY WITH  SUPPER.  Modified Medications   No medications on file  Discontinued Medications   DONEPEZIL (ARICEPT) 10 MG TABLET    1/2 tablet by mouth daily at bedtime for 1 month then to increase to 1 tablet daily at bedtime for memory loss   SIMVASTATIN (ZOCOR) 20 MG TABLET    Take 20 mg by mouth daily.    Physical Exam:  Vitals:   04/18/19 1433  BP: 132/70  Pulse: (!) 47  Temp: 98.6 F (37 C)  TempSrc: Temporal  Weight: 174 lb (78.9 kg)  Height: 5\' 10"  (1.778 m)   Body mass index is 24.97 kg/m. Wt Readings from Last 3 Encounters:  04/18/19 174 lb (78.9 kg)  03/26/19 174 lb (78.9 kg)  03/13/19 173 lb (78.5 kg)    Physical Exam Constitutional:      General: He is not in acute distress.    Appearance: He is well-developed. He is not diaphoretic.  HENT:     Head: Normocephalic and atraumatic.     Mouth/Throat:     Pharynx: No oropharyngeal exudate.  Eyes:     Conjunctiva/sclera: Conjunctivae normal.     Pupils: Pupils are equal, round, and reactive to light.  Neck:     Musculoskeletal: Normal range of motion and neck supple.  Cardiovascular:     Rate and Rhythm: Normal rate and regular rhythm.     Pulses:          Dorsalis pedis pulses are 1+ on the right side and 1+ on the left side.       Posterior tibial pulses are 1+ on the right side and 1+ on the left side.     Heart sounds: Normal heart sounds.   Pulmonary:     Effort: Pulmonary effort is normal.     Breath sounds: Normal breath sounds.  Abdominal:     General: Bowel sounds are normal.     Palpations: Abdomen is soft.  Musculoskeletal:     Lumbar back: He exhibits tenderness.       Back:     Comments: Left leg smaller than right   Feet:     Right foot:     Protective Sensation: 3 sites tested. 3 sites sensed.     Skin integrity: Skin integrity normal.     Toenail Condition: Right toenails are abnormally thick and long.     Left foot:     Protective Sensation: 3 sites tested. 3 sites sensed.     Skin integrity: Skin integrity normal.     Toenail Condition: Left toenails are abnormally thick and long.     Comments: Sensation intact bilaterally but decreased on right vs left Skin:    General: Skin is warm and dry.          Comments: 2 small skin tears to left hand without drainage, tenderness or redness.   Neurological:     Mental Status: He is alert and oriented to person, place, and time.      Labs reviewed: Basic Metabolic Panel: Recent Labs    08/12/18 1228 12/20/18 1304 12/20/18 1525 12/24/18 1509 03/13/19 1320  NA  --  139  --  140 139  K  --  5.7* 4.1 4.9 4.1  CL  --  104  --  102 106  CO2  --  25  --  22 25  GLUCOSE  --  151*  --  112* 151*  BUN  --  17  --  26 21  CREATININE  --  0.94  --  1.12 1.14  CALCIUM  --  9.7  --  9.5 9.4  MG  --   --   --   --  2.2  TSH 1.88  --   --  2.200  --    Liver Function Tests: Recent Labs    08/07/18 1015 03/13/19 1320  AST 16 21  ALT 22 26  ALKPHOS  --  81  BILITOT 0.5 0.7  PROT 6.9 7.0  ALBUMIN  --  3.8   No results for input(s): LIPASE, AMYLASE in the last 8760 hours. No results for input(s): AMMONIA in the last 8760 hours. CBC: Recent Labs    08/07/18 1015 12/20/18 1304 12/24/18 1509 03/13/19 1320  WBC 6.9 7.6 7.2 7.5  NEUTROABS 3,919 5.2  --  4.8  HGB 14.7 15.4 14.2 14.4  HCT 43.2 46.7 42.6 44.5  MCV 92.3 93.2 93 94.1  PLT 224 228 211  230   Lipid Panel: Recent Labs    08/07/18 1015 11/11/18 0939  CHOL 150 115  HDL 35* 38*  LDLCALC 96 63  TRIG 94 65  CHOLHDL 4.3 3.0   TSH: Recent Labs    08/12/18 1228 12/24/18 1509  TSH 1.88 2.200   A1C: Lab Results  Component Value Date   HGBA1C 5.8 (H) 11/11/2018     Assessment/Plan 1. Essential hypertension Stable at this time. Continue current regimen with diet modifications.   2. Diastolic dysfunction euvolemia at this time.  3. Type 2 diabetes mellitus with diabetic neuropathy, without long-term current use of insulin ([place]) -diet controlled.  Encouraged dietary compliance, routine foot care/monitoring and to keep up with diabetic eye exams through ophthalmology  - Hemoglobin A1c  4. Dementia without behavioral disturbance, unspecified dementia type ([place]) -progressive decline, could not tolerate aricept due to GI symptoms and bradycardia  - Memantine HCl ER (NAMENDA XR TITRATION PACK) 7 & 14 & 21 &28 MG CP24; Use as direction  Dispense: 28 capsule; Refill: 0  5. Weakness -progressive weakness noted - Ambulatory referral to Neurology and home health PT  6. Lumbar pain - DG Lumbar Spine Complete; Future - Ambulatory referral to Neurology  7. Paroxysmal atrial fibrillation (HCC) Rate controlled- has bradycardia, has been seen by cardiology more frequently due to recurrent a fib with RVR   8. Neuropathy -neuropathy reported to bilateral upper and lower extremities.  Left leg appears to be smaller than right however no decrease in sensation. Mild weakness noted, will have home health PT evaluate and neurology referall  - DG Lumbar Spine Complete; Future - Ambulatory referral to Neurology  9. Vitamin D deficiency - Vitamin D, 25-hydroxy  10. Vitamin B12 deficiency -continues on supplement, will follow up - Vitamin B12  11. Need for influenza vaccination - Flu Vaccine QUAD High Dose(Fluad)  Next appt: 6 months, sooner if needed Octavia Mottola K.  [place], [facility] [phone]

## 2019-04-18 NOTE — Patient Instructions (Signed)
To monitor hand Use xeroform dressing to hand (to change every 3 days or sooner if needed) Cover with gauze  To notify if redness, swelling or pain occurs  To let us know if having side effects or cost issue with namenda If tolerating and needing refill to call the office.

## 2019-04-19 LAB — HEMOGLOBIN A1C
Hgb A1c MFr Bld: 5.9 % of total Hgb — ABNORMAL HIGH (ref <5.7)
Mean Plasma Glucose: 123 (calc)
eAG (mmol/L): 6.8 (calc)

## 2019-04-19 LAB — VITAMIN D 25 HYDROXY (VIT D DEFICIENCY, FRACTURES): Vit D, 25-Hydroxy: 47 ng/mL (ref 30–100)

## 2019-04-19 LAB — VITAMIN B12: Vitamin B-12: >2000 pg/mL — ABNORMAL HIGH (ref 200–1100)

## 2019-04-21 ENCOUNTER — Encounter: Payer: Self-pay | Admitting: Neurology

## 2019-04-22 ENCOUNTER — Other Ambulatory Visit: Payer: Self-pay | Admitting: Nurse Practitioner

## 2019-04-22 DIAGNOSIS — I10 Essential (primary) hypertension: Secondary | ICD-10-CM

## 2019-05-05 ENCOUNTER — Encounter: Payer: Self-pay | Admitting: Nurse Practitioner

## 2019-05-05 DIAGNOSIS — I480 Paroxysmal atrial fibrillation: Secondary | ICD-10-CM | POA: Diagnosis not present

## 2019-05-05 DIAGNOSIS — E1140 Type 2 diabetes mellitus with diabetic neuropathy, unspecified: Secondary | ICD-10-CM | POA: Diagnosis not present

## 2019-05-05 DIAGNOSIS — F0280 Dementia in other diseases classified elsewhere without behavioral disturbance: Secondary | ICD-10-CM

## 2019-05-05 DIAGNOSIS — M4126 Other idiopathic scoliosis, lumbar region: Secondary | ICD-10-CM | POA: Diagnosis not present

## 2019-05-05 DIAGNOSIS — M5116 Intervertebral disc disorders with radiculopathy, lumbar region: Secondary | ICD-10-CM | POA: Diagnosis not present

## 2019-05-05 DIAGNOSIS — I5030 Unspecified diastolic (congestive) heart failure: Secondary | ICD-10-CM | POA: Diagnosis not present

## 2019-05-05 DIAGNOSIS — I2510 Atherosclerotic heart disease of native coronary artery without angina pectoris: Secondary | ICD-10-CM | POA: Diagnosis not present

## 2019-05-05 DIAGNOSIS — I483 Typical atrial flutter: Secondary | ICD-10-CM | POA: Diagnosis not present

## 2019-05-05 DIAGNOSIS — E538 Deficiency of other specified B group vitamins: Secondary | ICD-10-CM | POA: Diagnosis not present

## 2019-05-05 DIAGNOSIS — I110 Hypertensive heart disease with heart failure: Secondary | ICD-10-CM | POA: Diagnosis not present

## 2019-05-05 DIAGNOSIS — R69 Illness, unspecified: Secondary | ICD-10-CM | POA: Diagnosis not present

## 2019-05-06 DIAGNOSIS — E538 Deficiency of other specified B group vitamins: Secondary | ICD-10-CM | POA: Diagnosis not present

## 2019-05-06 DIAGNOSIS — E1140 Type 2 diabetes mellitus with diabetic neuropathy, unspecified: Secondary | ICD-10-CM | POA: Diagnosis not present

## 2019-05-06 DIAGNOSIS — I2510 Atherosclerotic heart disease of native coronary artery without angina pectoris: Secondary | ICD-10-CM | POA: Diagnosis not present

## 2019-05-06 DIAGNOSIS — I5030 Unspecified diastolic (congestive) heart failure: Secondary | ICD-10-CM | POA: Diagnosis not present

## 2019-05-06 DIAGNOSIS — M4126 Other idiopathic scoliosis, lumbar region: Secondary | ICD-10-CM | POA: Diagnosis not present

## 2019-05-06 DIAGNOSIS — R69 Illness, unspecified: Secondary | ICD-10-CM | POA: Diagnosis not present

## 2019-05-06 DIAGNOSIS — I110 Hypertensive heart disease with heart failure: Secondary | ICD-10-CM | POA: Diagnosis not present

## 2019-05-06 DIAGNOSIS — M5116 Intervertebral disc disorders with radiculopathy, lumbar region: Secondary | ICD-10-CM | POA: Diagnosis not present

## 2019-05-06 DIAGNOSIS — I480 Paroxysmal atrial fibrillation: Secondary | ICD-10-CM | POA: Diagnosis not present

## 2019-05-06 DIAGNOSIS — I483 Typical atrial flutter: Secondary | ICD-10-CM | POA: Diagnosis not present

## 2019-05-07 ENCOUNTER — Other Ambulatory Visit: Payer: Self-pay | Admitting: Nurse Practitioner

## 2019-05-07 DIAGNOSIS — F0390 Unspecified dementia without behavioral disturbance: Secondary | ICD-10-CM

## 2019-05-07 DIAGNOSIS — M5116 Intervertebral disc disorders with radiculopathy, lumbar region: Secondary | ICD-10-CM | POA: Diagnosis not present

## 2019-05-07 DIAGNOSIS — I480 Paroxysmal atrial fibrillation: Secondary | ICD-10-CM | POA: Diagnosis not present

## 2019-05-07 DIAGNOSIS — E538 Deficiency of other specified B group vitamins: Secondary | ICD-10-CM | POA: Diagnosis not present

## 2019-05-07 DIAGNOSIS — R69 Illness, unspecified: Secondary | ICD-10-CM | POA: Diagnosis not present

## 2019-05-07 DIAGNOSIS — I110 Hypertensive heart disease with heart failure: Secondary | ICD-10-CM | POA: Diagnosis not present

## 2019-05-07 DIAGNOSIS — I5030 Unspecified diastolic (congestive) heart failure: Secondary | ICD-10-CM | POA: Diagnosis not present

## 2019-05-07 DIAGNOSIS — M4126 Other idiopathic scoliosis, lumbar region: Secondary | ICD-10-CM | POA: Diagnosis not present

## 2019-05-07 DIAGNOSIS — I483 Typical atrial flutter: Secondary | ICD-10-CM | POA: Diagnosis not present

## 2019-05-07 DIAGNOSIS — I2510 Atherosclerotic heart disease of native coronary artery without angina pectoris: Secondary | ICD-10-CM | POA: Diagnosis not present

## 2019-05-07 DIAGNOSIS — E1140 Type 2 diabetes mellitus with diabetic neuropathy, unspecified: Secondary | ICD-10-CM | POA: Diagnosis not present

## 2019-05-07 MED ORDER — MEMANTINE HCL 28 X 5 MG & 21 X 10 MG PO TABS: ORAL_TABLET | ORAL | 12 refills | Status: DC

## 2019-05-07 NOTE — Progress Notes (Signed)
Namenda XR not covered by insurance- sent regular namenda titration to pharmacy

## 2019-05-10 ENCOUNTER — Other Ambulatory Visit: Payer: Self-pay | Admitting: Nurse Practitioner

## 2019-05-10 DIAGNOSIS — I10 Essential (primary) hypertension: Secondary | ICD-10-CM

## 2019-05-11 ENCOUNTER — Telehealth: Payer: Self-pay | Admitting: Physician Assistant

## 2019-05-11 NOTE — Telephone Encounter (Signed)
Patient went into atrial fibrillation yesterday.  He took PRN metoprolol.  Heart rate stable yesterday at 70s.  However feeling poor today.  He is severely fatigue and wants to lay down.  Heart rate ranging from 80-110.  Spoke with daughter.  She already took PRN metoprolol today.  I have advised to take another tablet of metoprolol.  BPs table. If continues to feel fatigue and tired advised to come to ER today otherwise he will call office  Tomorrow.

## 2019-05-12 ENCOUNTER — Telehealth: Payer: Self-pay | Admitting: Cardiovascular Disease

## 2019-05-12 MED ORDER — HYDRALAZINE HCL 25 MG PO TABS: 25.0000 mg | ORAL_TABLET | Freq: Two times a day (BID) | ORAL | 1 refills | Status: DC

## 2019-05-12 NOTE — Telephone Encounter (Signed)
Spoke with his daughter Henderson Cloud. He has no energy since he is in atrial fibrillation and it sounds like his been in uninterrupted arrhythmia since Saturday. He has had 2 successful cardioversions in May and August, maintaining sinus rhythm for only roughly 2 months each time.  Every time he returns normal rhythm his quality of life improves. We have been avoiding antiarrhythmics due to a tendency to sinus bradycardia.  In the past amiodarone was used, but stopped due to bradycardia.  He has talked over the option for a pacemaker for tachycardia-bradycardia syndrome with his daughter and he is now agreeable to that concept. We will plan to start amiodarone 200 mg twice daily for a couple of weeks after which we will schedule him for cardioversion, followed by pacemaker implantation if he has severe/symptomatic bradycardia.  It is important that he not miss any doses of his anticoagulant in the interim.  Prescribe amiodarone 200 mg tablets, 1 twice daily for 2 weeks followed by 1 daily indefinitely.  Tentatively schedule for cardioversion on Tuesday, October 27.

## 2019-05-12 NOTE — Telephone Encounter (Signed)
Thanks. I would probably stay the course, on same meds. Occasional AFib breakthrough is unfortunately inevitable.  He did the right thing. He can resume his usual meds as before. He should seek urgent attention if he has chest tightness>30 minutes, severe dyspnea or syncope.

## 2019-05-12 NOTE — Telephone Encounter (Signed)
Call placed to the patient's daughter. She stated that she did an EKG on the patient's apple watch and it showed afib with a heart rate of 72. When the patient tried to get up today, he felt tired and was not able to. He only feels better when he is lying down. She feels like the patient has now been in afib since Saturday. According to the apple watch his heart rate has ranged 40-119.  The daughter would like to know what the next step would be for her father. She is going to give her father a Metoprolol to see if this helps the afib.

## 2019-05-12 NOTE — Telephone Encounter (Signed)
Spoke with the patient's daughter. She stated that the patient went into atrial fibrillation on Saturday. His heart rate was from the 80's-100's. Normally his heart rate is around 50-60's. The daughter stated that anytime the patient's heart rate gets elevated higher than his normal that he can feel it. His apple watch showed atrial fibrillation. He was symptomatic with feelings of fatigue and shortness of breath. He took his PRN Metoprolol and feels like he has been in normal rhythm since then.   This morning his blood pressure was 139/88 and heart rate was 66. He took his amlodipine but held the Losartan and Hydralazine. He does feel better this morning, not as tired or winded.   They called the on call yesterday and was advised to call the office this morning.

## 2019-05-12 NOTE — Telephone Encounter (Signed)
Follow Up:   Pt's daughter called and said pt had episodes again of Atrial Fib this weekend. She talked to Dr Lurline Del PA yesterday and was told to call the office this morning and see what Dr C wants patient to do.

## 2019-05-13 ENCOUNTER — Inpatient Hospital Stay (HOSPITAL_COMMUNITY)
Admission: EM | Admit: 2019-05-13 | Discharge: 2019-05-16 | DRG: 309 | Disposition: A | Discharge status: Home Health | Payer: Medicare HMO | Attending: Cardiovascular Disease | Admitting: Cardiovascular Disease

## 2019-05-13 ENCOUNTER — Emergency Department (HOSPITAL_COMMUNITY): Payer: Medicare HMO

## 2019-05-13 ENCOUNTER — Other Ambulatory Visit: Payer: Self-pay

## 2019-05-13 ENCOUNTER — Encounter (HOSPITAL_COMMUNITY): Payer: Self-pay | Admitting: Emergency Medicine

## 2019-05-13 DIAGNOSIS — I480 Paroxysmal atrial fibrillation: Secondary | ICD-10-CM

## 2019-05-13 DIAGNOSIS — Z8673 Personal history of transient ischemic attack (TIA), and cerebral infarction without residual deficits: Secondary | ICD-10-CM

## 2019-05-13 DIAGNOSIS — Z20828 Contact with and (suspected) exposure to other viral communicable diseases: Secondary | ICD-10-CM | POA: Diagnosis not present

## 2019-05-13 DIAGNOSIS — R079 Chest pain, unspecified: Secondary | ICD-10-CM | POA: Diagnosis not present

## 2019-05-13 DIAGNOSIS — Z87891 Personal history of nicotine dependence: Secondary | ICD-10-CM

## 2019-05-13 DIAGNOSIS — G4733 Obstructive sleep apnea (adult) (pediatric): Secondary | ICD-10-CM | POA: Diagnosis present

## 2019-05-13 DIAGNOSIS — Z8249 Family history of ischemic heart disease and other diseases of the circulatory system: Secondary | ICD-10-CM

## 2019-05-13 DIAGNOSIS — Z85818 Personal history of malignant neoplasm of other sites of lip, oral cavity, and pharynx: Secondary | ICD-10-CM

## 2019-05-13 DIAGNOSIS — E1151 Type 2 diabetes mellitus with diabetic peripheral angiopathy without gangrene: Secondary | ICD-10-CM | POA: Diagnosis present

## 2019-05-13 DIAGNOSIS — E559 Vitamin D deficiency, unspecified: Secondary | ICD-10-CM | POA: Diagnosis present

## 2019-05-13 DIAGNOSIS — Z91048 Other nonmedicinal substance allergy status: Secondary | ICD-10-CM

## 2019-05-13 DIAGNOSIS — Z888 Allergy status to other drugs, medicaments and biological substances status: Secondary | ICD-10-CM

## 2019-05-13 DIAGNOSIS — Z8349 Family history of other endocrine, nutritional and metabolic diseases: Secondary | ICD-10-CM

## 2019-05-13 DIAGNOSIS — I5032 Chronic diastolic (congestive) heart failure: Secondary | ICD-10-CM | POA: Diagnosis present

## 2019-05-13 DIAGNOSIS — I110 Hypertensive heart disease with heart failure: Secondary | ICD-10-CM | POA: Diagnosis present

## 2019-05-13 DIAGNOSIS — E785 Hyperlipidemia, unspecified: Secondary | ICD-10-CM | POA: Diagnosis present

## 2019-05-13 DIAGNOSIS — I2510 Atherosclerotic heart disease of native coronary artery without angina pectoris: Secondary | ICD-10-CM | POA: Diagnosis present

## 2019-05-13 DIAGNOSIS — Z7901 Long term (current) use of anticoagulants: Secondary | ICD-10-CM

## 2019-05-13 DIAGNOSIS — I441 Atrioventricular block, second degree: Secondary | ICD-10-CM | POA: Diagnosis present

## 2019-05-13 DIAGNOSIS — I252 Old myocardial infarction: Secondary | ICD-10-CM

## 2019-05-13 DIAGNOSIS — K219 Gastro-esophageal reflux disease without esophagitis: Secondary | ICD-10-CM | POA: Diagnosis present

## 2019-05-13 DIAGNOSIS — I080 Rheumatic disorders of both mitral and aortic valves: Secondary | ICD-10-CM | POA: Diagnosis present

## 2019-05-13 DIAGNOSIS — I495 Sick sinus syndrome: Secondary | ICD-10-CM | POA: Diagnosis present

## 2019-05-13 DIAGNOSIS — I4892 Unspecified atrial flutter: Secondary | ICD-10-CM | POA: Diagnosis present

## 2019-05-13 LAB — CBC
HCT: 44.9 % (ref 39.0–52.0)
Hemoglobin: 14.4 g/dL (ref 13.0–17.0)
MCH: 31.3 pg (ref 26.0–34.0)
MCHC: 32.1 g/dL (ref 30.0–36.0)
MCV: 97.6 fL (ref 80.0–100.0)
Platelets: 239 10*3/uL (ref 150–400)
RBC: 4.6 MIL/uL (ref 4.22–5.81)
RDW: 13.6 % (ref 11.5–15.5)
WBC: 6.7 10*3/uL (ref 4.0–10.5)
nRBC: 0 % (ref 0.0–0.2)

## 2019-05-13 LAB — COMPREHENSIVE METABOLIC PANEL
ALT: 14 U/L (ref 0–44)
AST: 15 U/L (ref 15–41)
Albumin: 3.6 g/dL (ref 3.5–5.0)
Alkaline Phosphatase: 77 U/L (ref 38–126)
Anion gap: 11 (ref 5–15)
BUN: 32 mg/dL — ABNORMAL HIGH (ref 8–23)
CO2: 25 mmol/L (ref 22–32)
Calcium: 9.1 mg/dL (ref 8.9–10.3)
Chloride: 106 mmol/L (ref 98–111)
Creatinine, Ser: 1.29 mg/dL — ABNORMAL HIGH (ref 0.61–1.24)
GFR calc Af Amer: 59 mL/min — ABNORMAL LOW (ref >60)
GFR calc non Af Amer: 51 mL/min — ABNORMAL LOW (ref >60)
Glucose, Bld: 129 mg/dL — ABNORMAL HIGH (ref 70–99)
Potassium: 4.1 mmol/L (ref 3.5–5.1)
Sodium: 142 mmol/L (ref 135–145)
Total Bilirubin: 1 mg/dL (ref 0.3–1.2)
Total Protein: 7 g/dL (ref 6.5–8.1)

## 2019-05-13 LAB — TROPONIN I (HIGH SENSITIVITY): Troponin I (High Sensitivity): 10 ng/L (ref <18)

## 2019-05-13 MED ORDER — SODIUM CHLORIDE 0.9% FLUSH: 3.0000 mL | Freq: Once | INTRAVENOUS | Status: DC

## 2019-05-13 NOTE — ED Provider Notes (Signed)
[facility] EMERGENCY DEPARTMENT Provider Note   CSN: [id number] Arrival date & time: 05/13/19  1445     History   Chief Complaint Chief Complaint  Patient presents with  . Chest Pain    HPI Jose Patel is a 85 y.o. male.     The history is provided by the patient, a relative and medical records. No language interpreter was used.   Jose Patel is a 85 y.o. male who presents to the Emergency Department complaining of atrial fibrillation. He presents the emergency department complaining of atrial fibrillation that began three days ago. He has a history of recurrent atrial fibrillation and is followed by cardiology. He has had three cardioversion's in the past. Symptoms began abruptly and will come from sleep on Friday. He has persistent malaise, poor energy and dyspnea on exertion. He comes from home and lives with family. He denies any fevers, cough, chest pain, nausea, vomiting. He contacted his cardiologist over the weekend and he did try metoprolol with no change in his symptoms. Today he was prescribed amiodarone for his symptoms but the prescription was not available in the morning. With atrial fibrillation he is too weak to perform his ADLs and his daughter brought him in for evaluation. He does have a history of bradycardia following cardioversion and there is a plan for possible pacemaker placement in the future. He has been compliant with his Xarelto.  Past Medical History:  Diagnosis Date  . A-fib ([place])   . Anticoagulated on Coumadin    Per records from [place]   . Aortic regurgitation   . Aortic stenosis   . CHF (congestive heart failure) ([place])   . Coronary artery disease   . CVA (cerebral vascular accident) ([place])    hx of CVA noted on CT from 02/19/18  . Diabetes mellitus, type 2 ([place])   . Gastro-esophageal reflux disease without esophagitis    Per records from previous provider, [facility]  . Heart attack  ([place])   . History of CT scan of head 02/19/2018   Per records from previous provider, [facility] and [facility]. Chronic changes small vessel disease  . History of ECG    03/26/14- Sinus Tachycardia @ 107 bmp, QRS 78 msec, QT 298 msec, QTc 361 msec. Per records from [place]  . History of Holter monitoring 11/08/2016   Per records from previous provider, [facility]  . Hypertension   . 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 MI (myocardial infarction) [facility])    Per records from [place]   . Mitral regurgitation   . Mitral stenosis   . Other intervertebral disc degeneration, lumbar region    Per records from previous provider, [facility] and [facility]  . Radiculopathy, lumbar region    Per records from previous provider, [facility] and [facility]  . Sinus bradycardia    Per records from [place]   . Sleep apnea   . Transient ischemic attack    10 to 12 years ago  . Typical atrial flutter [facility])    Per records from [place]     Patient Active Problem List   Diagnosis Date Noted  . Dyslipidemia (high LDL; low HDL) 03/26/2019  . SSS (sick sinus syndrome) ([place]) 03/26/2019  . Fatigue 01/16/2019  . Persistent atrial fibrillation (  [place])   . Vitamin D deficiency 11/20/2018  . Aortic valve regurgitation 07/07/2018  . Sinus bradycardia 07/07/2018  . Long term (current) use of anticoagulants 07/07/2018  . Diastolic dysfunction 0000000  . Hypercholesteremia 07/07/2018  . Mild dilation of ascending aorta (HCC) 07/07/2018  . OSA (obstructive sleep apnea)   . Essential hypertension   . Diabetes mellitus, type 2 ([place])   . Coronary artery disease   . Paroxysmal atrial fibrillation (H[facility])   . CHF (congestive heart failure) ([place])     Past Surgical History:  Procedure Laterality Date  .  CARDIAC ELECTROPHYSIOLOGY STUDY AND ABLATION    . CARDIOVERSION N/A 12/26/2018   Procedure: CARDIOVERSION;  Surgeon: Pixie Casino, MD;  Location: [facility] ENDOSCOPY;  Service: Cardiovascular;  Laterality: N/A;  . CATARACT EXTRACTION    . Debridement to right arm with MRSA infection     4 years ago  . GALLBLADDER SURGERY     Per records from [place]   . TEE WITHOUT CARDIOVERSION N/A 12/26/2018   Procedure: TRANSESOPHAGEAL ECHOCARDIOGRAM (TEE);  Surgeon: Pixie Casino, MD;  Location: [facility] ENDOSCOPY;  Service: Cardiovascular;  Laterality: N/A;        Home Medications    Prior to Admission medications   Medication Sig Start Date End Date Taking? Authorizing Provider  amLODipine (NORVASC) 10 MG tablet TAKE 1 TABLET BY MOUTH EVERY DAY Patient taking differently: Take 10 mg by mouth daily.  04/22/19  Yes [name], NP  amoxicillin (AMOXIL) 500 MG capsule Take 2,000 mg by mouth See admin instructions. Take 2,000 mg by mouth one hour prior to dental appointment 04/17/19  Yes [provider]  Cholecalciferol (VITAMIN D3) 50 MCG (2000 UT) TABS Take 2,000 Units by mouth daily with breakfast.   Yes [provider]  gabapentin (NEURONTIN) 100 MG capsule Take 1 capsule (100 mg total) by mouth daily as needed. Patient taking differently: Take 100 mg by mouth daily as needed (for nerve pain).  11/20/18  Yes [name], NP  hydrALAZINE (APRESOLINE) 25 MG tablet Take 1 tablet (25 mg total) by mouth 2 (two) times daily. 05/12/19  Yes [name], NP  losartan (COZAAR) 50 MG tablet Take 2 tablets (100 mg total) by mouth daily. 02/07/19  Yes [name], NP  Melatonin 5 MG TABS Take 5 mg by mouth at bedtime as needed (for sleep).    Yes [provider]  metoprolol tartrate (LOPRESSOR) 25 MG tablet Take 1 tablet (25 mg total) by mouth as needed (Once daily as needed for a heart rate over 100 bpm.). Patient taking differently: Take 25 mg by mouth daily as  needed (for a heart rate over 100 bpm).  03/26/19 06/24/19 Yes Croitoru, Mihai, MD  Red Yeast Rice 600 MG CAPS Take 600 mg by mouth 2 (two) times a day. 01/21/19  Yes [provider]  XARELTO 20 MG TABS tablet TAKE 1 TABLET (20 MG TOTAL) BY MOUTH DAILY WITH SUPPER. Patient taking differently: Take 20 mg by mouth daily.  02/10/19  Yes [name], NP  memantine [facility] TITRATION PAK) tablet pack 5 mg/day for =1 week; 5 mg twice daily for =1 week; 15 mg/day given in 5 mg and 10 mg separated doses for =1 week; then 10 mg twice daily Patient taking differently: Take 5-10 mg by mouth See admin instructions. Take 5 mg by mouth once a day for 7 days, 5 mg two times a day for 7 days, 15 mg once  a day (given in 5 mg & 10 mg separated "doses," and 10 mg two times a day thereafter 05/07/19   [name], NP  vitamin B-12 (CYANOCOBALAMIN) 1000 MCG tablet Take 1 tablet (1,000 mcg total) by mouth daily. Patient not taking: Reported on 05/13/2019 08/19/18   [name], NP  Vitamin D, Ergocalciferol, (DRISDOL) 1.25 MG (50000 UT) CAPS capsule Take 1 capsule (50,000 Units total) by mouth every 7 (seven) days. Patient not taking: Reported on 05/13/2019 08/13/18   [name], NP    Family History Family History  Problem Relation Age of Onset  . Heart failure Mother   . Hyperlipidemia Mother   . Hypertension Son   . Hyperlipidemia Son   . Arthritis Son   . Arthritis Daughter   . Hypertension Son     Social History Social History   Tobacco Use  . Smoking status: Former Research scientist (life sciences)  . Smokeless tobacco: Never Used  . Tobacco comment: Quit at age 57  Substance Use Topics  . Alcohol use: Never    Frequency: Never  . Drug use: Never     Allergies   Aricept [donepezil], Tropicamide, and Adhesive [tape]   Review of Systems Review of Systems  All other systems reviewed and are negative.    Physical Exam Updated Vital Signs BP (!) 137/51   Pulse 68   Temp 97.8 F  (36.6 C) (Oral)   Resp 16   SpO2 98%   Physical Exam Vitals signs and nursing note reviewed.  Constitutional:      Appearance: He is well-developed.  HENT:     Head: Normocephalic and atraumatic.  Cardiovascular:     Rate and Rhythm: Bradycardia present. Rhythm irregular.     Heart sounds: No murmur.  Pulmonary:     Effort: Pulmonary effort is normal. No respiratory distress.     Breath sounds: Normal breath sounds.  Abdominal:     Palpations: Abdomen is soft.     Tenderness: There is no abdominal tenderness. There is no guarding or rebound.  Musculoskeletal:        General: No swelling or tenderness.  Skin:    General: Skin is warm and dry.  Neurological:     Mental Status: He is alert and oriented to person, place, and time.  Psychiatric:        Behavior: Behavior normal.      ED Treatments / Results  Labs (all labs ordered are listed, but only abnormal results are displayed) Labs Reviewed  COMPREHENSIVE METABOLIC PANEL - Abnormal; Notable for the following components:      Result Value   Glucose, Bld 129 (*)    BUN 32 (*)    Creatinine, Ser 1.29 (*)    GFR calc non Af Amer 51 (*)    GFR calc Af Amer 59 (*)    All other components within normal limits  SARS CORONAVIRUS 2 (TAT 6-24 HRS)  CBC  TROPONIN I (HIGH SENSITIVITY)  TROPONIN I (HIGH SENSITIVITY)    EKG EKG Interpretation  Date/Time:  Tuesday May 13 2019 22:08:22 EDT Ventricular Rate:  76 PR Interval:    QRS Duration: 87 QT Interval:  385 QTC Calculation: 433 R Axis:   -6 Text Interpretation:  Atrial fibrillation Borderline repolarization abnormality Confirmed by Quintella Reichert [phone]) on 05/13/2019 10:39:46 PM   Radiology Dg Chest 2 View  Result Date: 05/13/2019 CLINICAL DATA:  85 year old male with history of chest pain and palpitations. EXAM: CHEST - 2 VIEW COMPARISON:  Chest x-ray 03/13/2019. FINDINGS: Lung volumes are normal. No consolidative airspace disease. No pleural effusions.  No pneumothorax. No pulmonary nodule or mass noted. Pulmonary vasculature and the cardiomediastinal silhouette are within normal limits. Atherosclerosis in the thoracic aorta. IMPRESSION: 1.  No radiographic evidence of acute cardiopulmonary disease. 2. Aortic atherosclerosis. Electronically Signed   By: Vinnie Langton M.D.   On: 05/13/2019 15:51    Procedures Procedures (including critical care time)  Medications Ordered in ED Medications  sodium chloride flush (NS) 0.9 % injection 3 mL (has no administration in time range)     Initial Impression / Assessment and Plan / ED Course  I have reviewed the triage vital signs and the nursing notes.  Pertinent labs & imaging results that were available during my care of the patient were reviewed by me and considered in my medical decision making (see chart for details).       Patient with history of atrial fibrillation. He is rate controlled in the emergency department with rates 40s to 70s. He is profoundly symptomatic with poor activity tolerance due to his atrial fibrillation. Patient is hesitant to performing cardioversion at this time in the emergency department as he has a history of recurrent bradycardia after cardioversion and thinks that he may need a pacemaker. He is also hesitant to start amiodarone at home because he has too much fatigue to perform his ADLs currently and cannot tolerate two weeks on amiodarone prior to next cardioversion. Cardiology consulted for admission and further management. Final Clinical Impressions(s) / ED Diagnoses   Final diagnoses:  Paroxysmal atrial fibrillation [facility])    ED Discharge Orders    None       Quintella Reichert, MD 05/14/19 0007

## 2019-05-13 NOTE — Telephone Encounter (Signed)
Spoke with the patient's daughter. She stated that the patient has been feeling worse and they would rather go to the ED to go ahead and have a cardioversion done now.   Dr. Sallyanne Kuster and the hospital have been made aware of the arrival. The patient and daughter have been made aware to plan on being admitted.

## 2019-05-13 NOTE — Telephone Encounter (Signed)
Follow Up  Patient c/o Palpitations:  High priority if patient c/o lightheadedness, shortness of breath, or chest pain  1) How long have you had palpitations/irregular HR/ Afib? Are you having the symptoms now? Yes  2) Are you currently experiencing lightheadedness, SOB or CP? SOB, lightheadedness,   3) Do you have a history of afib (atrial fibrillation) or irregular heart rhythm? Yes  4) Have you checked your BP or HR? (document readings if available): 177/89 HR 63  5) Are you experiencing any other symptoms? Weak, passing out, not able to walk,

## 2019-05-13 NOTE — ED Triage Notes (Signed)
States his watch told him his heart was out of  Rhythm on Friday , felt palpitations

## 2019-05-14 ENCOUNTER — Inpatient Hospital Stay (HOSPITAL_COMMUNITY): Payer: Medicare HMO | Admitting: Certified Registered Nurse Anesthetist

## 2019-05-14 ENCOUNTER — Encounter (HOSPITAL_COMMUNITY): Payer: Self-pay

## 2019-05-14 ENCOUNTER — Encounter (HOSPITAL_COMMUNITY)
Admission: EM | Disposition: A | Discharge status: Home Health | Payer: Self-pay | Source: Home / Self Care | Attending: Cardiovascular Disease

## 2019-05-14 DIAGNOSIS — K219 Gastro-esophageal reflux disease without esophagitis: Secondary | ICD-10-CM | POA: Diagnosis not present

## 2019-05-14 DIAGNOSIS — E1151 Type 2 diabetes mellitus with diabetic peripheral angiopathy without gangrene: Secondary | ICD-10-CM | POA: Diagnosis not present

## 2019-05-14 DIAGNOSIS — Z8249 Family history of ischemic heart disease and other diseases of the circulatory system: Secondary | ICD-10-CM | POA: Diagnosis not present

## 2019-05-14 DIAGNOSIS — I4819 Other persistent atrial fibrillation: Secondary | ICD-10-CM | POA: Diagnosis not present

## 2019-05-14 DIAGNOSIS — I441 Atrioventricular block, second degree: Secondary | ICD-10-CM | POA: Diagnosis present

## 2019-05-14 DIAGNOSIS — E559 Vitamin D deficiency, unspecified: Secondary | ICD-10-CM | POA: Diagnosis present

## 2019-05-14 DIAGNOSIS — I509 Heart failure, unspecified: Secondary | ICD-10-CM | POA: Diagnosis not present

## 2019-05-14 DIAGNOSIS — I10 Essential (primary) hypertension: Secondary | ICD-10-CM | POA: Diagnosis not present

## 2019-05-14 DIAGNOSIS — I252 Old myocardial infarction: Secondary | ICD-10-CM | POA: Diagnosis not present

## 2019-05-14 DIAGNOSIS — I2510 Atherosclerotic heart disease of native coronary artery without angina pectoris: Secondary | ICD-10-CM | POA: Diagnosis not present

## 2019-05-14 DIAGNOSIS — R001 Bradycardia, unspecified: Secondary | ICD-10-CM | POA: Diagnosis not present

## 2019-05-14 DIAGNOSIS — I110 Hypertensive heart disease with heart failure: Secondary | ICD-10-CM | POA: Diagnosis not present

## 2019-05-14 DIAGNOSIS — I4892 Unspecified atrial flutter: Secondary | ICD-10-CM | POA: Diagnosis present

## 2019-05-14 DIAGNOSIS — I351 Nonrheumatic aortic (valve) insufficiency: Secondary | ICD-10-CM | POA: Diagnosis not present

## 2019-05-14 DIAGNOSIS — Z8673 Personal history of transient ischemic attack (TIA), and cerebral infarction without residual deficits: Secondary | ICD-10-CM | POA: Diagnosis not present

## 2019-05-14 DIAGNOSIS — I080 Rheumatic disorders of both mitral and aortic valves: Secondary | ICD-10-CM | POA: Diagnosis present

## 2019-05-14 DIAGNOSIS — Z7901 Long term (current) use of anticoagulants: Secondary | ICD-10-CM | POA: Diagnosis not present

## 2019-05-14 DIAGNOSIS — Z5181 Encounter for therapeutic drug level monitoring: Secondary | ICD-10-CM | POA: Diagnosis not present

## 2019-05-14 DIAGNOSIS — R079 Chest pain, unspecified: Secondary | ICD-10-CM | POA: Diagnosis present

## 2019-05-14 DIAGNOSIS — Z79899 Other long term (current) drug therapy: Secondary | ICD-10-CM | POA: Diagnosis not present

## 2019-05-14 DIAGNOSIS — I480 Paroxysmal atrial fibrillation: Principal | ICD-10-CM

## 2019-05-14 DIAGNOSIS — Z888 Allergy status to other drugs, medicaments and biological substances status: Secondary | ICD-10-CM | POA: Diagnosis not present

## 2019-05-14 DIAGNOSIS — I495 Sick sinus syndrome: Secondary | ICD-10-CM | POA: Diagnosis present

## 2019-05-14 DIAGNOSIS — Z85818 Personal history of malignant neoplasm of other sites of lip, oral cavity, and pharynx: Secondary | ICD-10-CM | POA: Diagnosis not present

## 2019-05-14 DIAGNOSIS — Z20828 Contact with and (suspected) exposure to other viral communicable diseases: Secondary | ICD-10-CM | POA: Diagnosis not present

## 2019-05-14 DIAGNOSIS — G4733 Obstructive sleep apnea (adult) (pediatric): Secondary | ICD-10-CM | POA: Diagnosis present

## 2019-05-14 DIAGNOSIS — E785 Hyperlipidemia, unspecified: Secondary | ICD-10-CM | POA: Diagnosis present

## 2019-05-14 DIAGNOSIS — Z91048 Other nonmedicinal substance allergy status: Secondary | ICD-10-CM | POA: Diagnosis not present

## 2019-05-14 DIAGNOSIS — I5032 Chronic diastolic (congestive) heart failure: Secondary | ICD-10-CM | POA: Diagnosis not present

## 2019-05-14 DIAGNOSIS — Z8349 Family history of other endocrine, nutritional and metabolic diseases: Secondary | ICD-10-CM | POA: Diagnosis not present

## 2019-05-14 DIAGNOSIS — Z87891 Personal history of nicotine dependence: Secondary | ICD-10-CM | POA: Diagnosis not present

## 2019-05-14 HISTORY — PX: CARDIOVERSION: SHX1299

## 2019-05-14 LAB — BASIC METABOLIC PANEL
Anion gap: 12 (ref 5–15)
BUN: 23 mg/dL (ref 8–23)
CO2: 26 mmol/L (ref 22–32)
Calcium: 8.9 mg/dL (ref 8.9–10.3)
Chloride: 102 mmol/L (ref 98–111)
Creatinine, Ser: 1.27 mg/dL — ABNORMAL HIGH (ref 0.61–1.24)
GFR calc Af Amer: >60 mL/min (ref >60)
GFR calc non Af Amer: 52 mL/min — ABNORMAL LOW (ref >60)
Glucose, Bld: 166 mg/dL — ABNORMAL HIGH (ref 70–99)
Potassium: 4.1 mmol/L (ref 3.5–5.1)
Sodium: 140 mmol/L (ref 135–145)

## 2019-05-14 LAB — TROPONIN I (HIGH SENSITIVITY): Troponin I (High Sensitivity): 13 ng/L (ref <18)

## 2019-05-14 LAB — SARS CORONAVIRUS 2 (TAT 6-24 HRS): SARS Coronavirus 2: NEGATIVE

## 2019-05-14 LAB — MAGNESIUM: Magnesium: 2 mg/dL (ref 1.7–2.4)

## 2019-05-14 SURGERY — CARDIOVERSION
Anesthesia: General

## 2019-05-14 MED ORDER — MAGNESIUM SULFATE 2 GM/50ML IV SOLN
2.0000 g | Freq: Once | INTRAVENOUS | Status: AC
  Administered 2019-05-14: 20:00:00 2 g via INTRAVENOUS
  Filled 2019-05-14: qty 50

## 2019-05-14 MED ORDER — SODIUM CHLORIDE 0.9% FLUSH: 3.0000 mL | INTRAVENOUS | Status: DC | PRN

## 2019-05-14 MED ORDER — ONDANSETRON HCL 4 MG/2ML IJ SOLN: 4.0000 mg | Freq: Four times a day (QID) | INTRAMUSCULAR | Status: DC | PRN

## 2019-05-14 MED ORDER — ACETAMINOPHEN 325 MG PO TABS: 650.0000 mg | ORAL_TABLET | ORAL | Status: DC | PRN

## 2019-05-14 MED ORDER — SODIUM CHLORIDE 0.9% FLUSH: 3.0000 mL | Freq: Two times a day (BID) | INTRAVENOUS | Status: DC

## 2019-05-14 MED ORDER — SODIUM CHLORIDE 0.9 % IV SOLN: 250.0000 mL | INTRAVENOUS | Status: DC

## 2019-05-14 MED ORDER — LIDOCAINE 2% (20 MG/ML) 5 ML SYRINGE
INTRAMUSCULAR | Status: DC | PRN
  Administered 2019-05-14: 80 mg via INTRAVENOUS

## 2019-05-14 MED ORDER — HYDRALAZINE HCL 25 MG PO TABS
25.0000 mg | ORAL_TABLET | Freq: Two times a day (BID) | ORAL | Status: DC
  Administered 2019-05-14 – 2019-05-16 (×5): 25 mg via ORAL
  Filled 2019-05-14 (×6): qty 1

## 2019-05-14 MED ORDER — RIVAROXABAN 20 MG PO TABS
20.0000 mg | ORAL_TABLET | Freq: Every day | ORAL | Status: DC
  Administered 2019-05-14 – 2019-05-15 (×2): 20 mg via ORAL
  Filled 2019-05-14 (×2): qty 1

## 2019-05-14 MED ORDER — AMLODIPINE BESYLATE 10 MG PO TABS
10.0000 mg | ORAL_TABLET | Freq: Every day | ORAL | Status: DC
  Administered 2019-05-14 – 2019-05-16 (×3): 10 mg via ORAL
  Filled 2019-05-14: qty 2
  Filled 2019-05-14 (×2): qty 1

## 2019-05-14 MED ORDER — PROPOFOL 10 MG/ML IV BOLUS
INTRAVENOUS | Status: DC | PRN
  Administered 2019-05-14: 80 mg via INTRAVENOUS

## 2019-05-14 MED ORDER — MELATONIN 3 MG PO TABS
3.0000 mg | ORAL_TABLET | Freq: Every evening | ORAL | Status: DC | PRN
  Administered 2019-05-14: 3 mg via ORAL
  Filled 2019-05-14 (×2): qty 1

## 2019-05-14 MED ORDER — SODIUM CHLORIDE 0.9 % IV SOLN: 250.0000 mL | INTRAVENOUS | Status: DC | PRN

## 2019-05-14 MED ORDER — DOFETILIDE 125 MCG PO CAPS
250.0000 ug | ORAL_CAPSULE | Freq: Two times a day (BID) | ORAL | Status: DC
  Administered 2019-05-14 – 2019-05-16 (×4): 250 ug via ORAL
  Filled 2019-05-14: qty 1
  Filled 2019-05-14 (×2): qty 2
  Filled 2019-05-14: qty 1

## 2019-05-14 MED ORDER — SODIUM CHLORIDE 0.9% FLUSH
3.0000 mL | Freq: Two times a day (BID) | INTRAVENOUS | Status: DC
  Administered 2019-05-14 – 2019-05-15 (×2): 3 mL via INTRAVENOUS

## 2019-05-14 MED ORDER — LOSARTAN POTASSIUM 50 MG PO TABS
100.0000 mg | ORAL_TABLET | Freq: Every day | ORAL | Status: DC
  Administered 2019-05-14 – 2019-05-16 (×3): 100 mg via ORAL
  Filled 2019-05-14 (×3): qty 2

## 2019-05-14 NOTE — ED Notes (Signed)
Pt NPO probably going for cardioversion today Resident for cards down to see pt

## 2019-05-14 NOTE — Transfer of Care (Signed)
Immediate Anesthesia Transfer of Care Note  Patient: Jose Patel  Procedure(s) Performed: CARDIOVERSION (N/A )  Patient Location: Endoscopy Unit  Anesthesia Type:General  Level of Consciousness: drowsy  Airway & Oxygen Therapy: Patient Spontanous Breathing  Post-op Assessment: Report given to RN and Post -op Vital signs reviewed and stable  Post vital signs: Reviewed and stable  Last Vitals:  Vitals Value Taken Time  BP    Temp    Pulse 92 05/14/19 1442  Resp 17 05/14/19 1442  SpO2 100 % 05/14/19 1442  Vitals shown include unvalidated device data.  Last Pain:  Vitals:   05/14/19 1416  TempSrc: Oral  PainSc: 0-No pain         Complications: No apparent anesthesia complications

## 2019-05-14 NOTE — ED Notes (Signed)
Family at bedside. 

## 2019-05-14 NOTE — Progress Notes (Signed)
Pharmacy: Dofetilide (Tikosyn) - Initial Consult Assessment and Electrolyte Replacement  Pharmacy consulted to assist in monitoring and replacing electrolytes in this 85 y.o. male admitted on 05/13/2019 undergoing dofetilide initiation. First dofetilide dose: 250 mcg, 10/14 pm  Assessment:  Patient Exclusion Criteria: If any screening criteria checked as "Yes", then  patient  should NOT receive dofetilide until criteria item is corrected.  If "Yes" please indicate correction plan.  YES  NO Patient  Exclusion Criteria Correction Plan   []   [x]   Baseline QTc interval is greater than or equal to 440 msec. IF above YES box checked dofetilide contraindicated unless patient has ICD; then may proceed if QTc 500-550 msec or with known ventricular conduction abnormalities may proceed with QTc 550-600 msec. QTc =  457    []   [x]   Patient is known or suspected to have a digoxin level greater than 2 ng/ml: No results found for: DIGOXIN     []   [x]   Creatinine clearance less than 20 ml/min (calculated using Cockcroft-Gault, actual body weight and serum creatinine): Estimated Creatinine Clearance: 44.1 mL/min (A) (by C-G formula based on SCr of 1.27 mg/dL (H)).     []   [x]  Patient has received drugs known to prolong the QT intervals within the last 48 hours (phenothiazines, tricyclics or tetracyclic antidepressants, erythromycin, H-1 antihistamines, cisapride, fluoroquinolones, azithromycin). Drugs not listed above may have an, as yet, undetected potential to prolong the QT interval, updated information on QT prolonging agents is available at this website:QT prolonging agents or www.crediblemeds.org    []   [x]   Patient received a dose of hydrochlorothiazide (Oretic) alone or in any combination including triamterene (Dyazide, Maxzide) in the last 48 hours.    []   [x]  Patient received a medication known to increase dofetilide plasma concentrations prior to initial dofetilide dose:   . Trimethoprim (Primsol, Proloprim) in the last 36 hours . Verapamil (Calan, Verelan) in the last 36 hours or a sustained release dose in the last 72 hours . Megestrol (Megace) in the last 5 days  . Cimetidine (Tagamet) in the last 6 hours . Ketoconazole (Nizoral) in the last 24 hours . Itraconazole (Sporanox) in the last 48 hours  . Prochlorperazine (Compazine) in the last 36 hours     []   [x]   Patient is known to have a history of torsades de pointes; congenital or acquired long QT syndromes.    []   [x]   Patient has received a Class 1 antiarrhythmic with less than 2 half-lives since last dose. (Disopyramide, Quinidine, Procainamide, Lidocaine, Mexiletine, Flecainide, Propafenone)    []   [x]   Patient has received amiodarone therapy in the past 3 months or amiodarone level is greater than 0.3 ng/ml.    Patient has been appropriately anticoagulated with Xarelto.  Labs:    Component Value Date/Time   K 4.1 05/14/2019 1722   MG 2.0 05/14/2019 1722     Plan: Potassium: K >/= 4: Appropriate to initiate Tikosyn, no replacement needed    Magnesium: Mg 1.8-2: Give Mg 2 gm IV x1 to prevent Mg from dropping below 1.8 - do not need to recheck Mg. Appropriate to initiate Tikosyn   Thank you for allowing pharmacy to participate in this patient's care   Marguerite Olea, [facility] Clinical Pharmacist Phone [phone]  05/14/2019 6:41 PM

## 2019-05-14 NOTE — ED Notes (Signed)
Assisted up to [place]

## 2019-05-14 NOTE — H&P (Signed)
Cardiology Admission History and Physical:   Patient ID: Jose Patel; MRN: [id number]; DOB: June 03, 1934   Admission date: 05/13/2019  Primary Care Provider:  [name], NP Primary Cardiologist: Thurmon Fair, MD   Chief Complaint:   Generalized weakness  History of Present Illness:   Jose Patel is a 85 y.o. male with a history of paroxysmal atrial fibrillation (on Xarelto) s/p radiofrequency ablation, diastolic heart failure, aortic insufficiency, CVA, hypertension, hyperlipidemia, GERD, OSA, and back pain who presents to the hospital with complaints of generalized weakness.  He has a history of atrial fibrillation for which he underwent a radiofrequency ablation in 2016.  Unfortunately he had recurrence of his atrial arrhythmia.  He has been cardioverted for his complaints of generalized weakness.  In the more recent past the patient was in sinus rhythm but went back into atrial fibrillation on 05/10/2019.  For the past 3 days the patient has noted increasing fatigue and exertional shortness of breath.    The patient and his daughter communicated with Dr. Royann Shivers.  He recommended starting amiodarone 200 mg twice daily for a couple of weeks and then to schedule him for an elective cardioversion.  There was also some discussion about a pacemaker implantation should the patient have persistent bradycardia after normalization of his rhythm.  Unfortunately the patient has been feeling worse since that discussion.  He now has more pronounced fatigue and weakness.  He denies any chest pain or syncope.  He does admit to palpitations.  In the ED the blood pressure was 175/86 mmHg with a heart rate in the 60s and atrial fibrillation.  The high-sensitivity troponins were 10 and 13.  The ECG did not reveal any ischemic changes.   Cardiac studies Echocardiogram 07/10/2018 - Left ventricle: The cavity size was normal. There was mild   concentric hypertrophy. Systolic function was normal.  The   estimated ejection fraction was in the range of 60% to 65%. Wall   motion was normal; there were no regional wall motion   abnormalities. Features are consistent with a pseudonormal left   ventricular filling pattern, with concomitant abnormal relaxation   and increased filling pressure (grade 2 diastolic dysfunction).   The calculated 3D left ventricular ejection fraction was 64 % - Aortic valve: There was mild to moderate regurgitation. - Aorta: Ascending aorta maximal dimension: 4.55 mm. - Ascending aorta: The ascending aorta was moderately dilated. - Mitral valve: There was mild regurgitation. - Left atrium: The atrium was moderately to severely dilated. - Atrial septum: No defect or patent foramen ovale was identified. Impressions: - Normal LV EF, grade 2 diastolic dysfunction. 3D EF 64%.   Aortic valve with mild-moderate AR. Ascending aorta moderately   dilated, range 4.2-4.5 cm.   Past Medical History:  Diagnosis Date  . A-fib (HCC)   . Anticoagulated on Coumadin    Per records from [facility]   . Aortic regurgitation   . Aortic stenosis   . CHF (congestive heart failure) (HCC)   . Coronary artery disease   . CVA (cerebral vascular accident) (HCC)    hx of CVA noted on CT from 02/19/18  . Diabetes mellitus, type 2 (HCC)   . Gastro-esophageal reflux disease without esophagitis    Per records from previous provider, [facility]  . Heart attack (HCC)   . History of CT scan of head 02/19/2018   Per records from previous provider, [facility]. Chronic changes small vessel disease  .  History of ECG    03/26/14- Sinus Tachycardia @ 107 bmp, QRS 78 msec, QT 298 msec, QTc 361 msec. Per records from [facility]  . History of Holter monitoring 11/08/2016   Per records from previous provider, [facility]  . Hypertension   . 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 MI (myocardial infarction) [facility])    Per records from [facility]   . Mitral regurgitation   . Mitral stenosis   . Other intervertebral disc degeneration, lumbar region    Per records from previous provider, [facility]  . Radiculopathy, lumbar region    Per records from previous provider, [facility]  . Sinus bradycardia    Per records from [facility]   . Sleep apnea   . Transient ischemic attack    10 to 12 years ago  . Typical atrial flutter (HCC)    Per records from [facility]     Past Surgical History:  Procedure Laterality Date  . CARDIAC ELECTROPHYSIOLOGY STUDY AND ABLATION    . CARDIOVERSION N/A 12/26/2018   Procedure: CARDIOVERSION;  Surgeon: Chrystie Nose, MD;  Location: [facility] ENDOSCOPY;  Service: Cardiovascular;  Laterality: N/A;  . CATARACT EXTRACTION    . Debridement to right arm with MRSA infection     4 years ago  . GALLBLADDER SURGERY     Per records from [facility]   . TEE WITHOUT CARDIOVERSION N/A 12/26/2018   Procedure: TRANSESOPHAGEAL ECHOCARDIOGRAM (TEE);  Surgeon: Chrystie Nose, MD;  Location: [facility] ENDOSCOPY;  Service: Cardiovascular;  Laterality: N/A;     Medications Prior to Admission: Prior to Admission medications   Medication Sig Start Date End Date Taking? Authorizing Provider  amLODipine (NORVASC) 10 MG tablet TAKE 1 TABLET BY MOUTH EVERY DAY Patient taking differently: Take 10 mg by mouth daily.  04/22/19  Yes [name], NP  amoxicillin (AMOXIL) 500 MG capsule Take 2,000 mg by mouth See admin instructions. Take 2,000 mg by mouth one hour prior to dental appointment 04/17/19  Yes [provider]  Cholecalciferol (VITAMIN D3) 50 MCG (2000 UT) TABS Take 2,000 Units by mouth daily with breakfast.   Yes [provider]  gabapentin (NEURONTIN) 100 MG capsule Take 1 capsule (100 mg  total) by mouth daily as needed. Patient taking differently: Take 100 mg by mouth daily as needed (for nerve pain).  11/20/18  Yes [name], NP  hydrALAZINE (APRESOLINE) 25 MG tablet Take 1 tablet (25 mg total) by mouth 2 (two) times daily. 05/12/19  Yes [name], NP  losartan (COZAAR) 50 MG tablet Take 2 tablets (100 mg total) by mouth daily. 02/07/19  Yes [name], NP  Melatonin 5 MG TABS Take 5 mg by mouth at bedtime as needed (for sleep).    Yes [provider]  metoprolol tartrate (LOPRESSOR) 25 MG tablet Take 1 tablet (25 mg total) by mouth as needed (Once daily as needed for a heart rate over 100 bpm.). Patient taking differently: Take 25 mg by mouth daily as needed (for a heart rate over 100 bpm).  03/26/19 06/24/19 Yes Croitoru, Mihai, MD  Red Yeast Rice 600 MG CAPS Take 600 mg by mouth 2 (two) times a day. 01/21/19  Yes [provider]  XARELTO 20 MG TABS tablet TAKE 1 TABLET (20  MG TOTAL) BY MOUTH DAILY WITH SUPPER. Patient taking differently: Take 20 mg by mouth daily.  02/10/19  Yes [name], NP  memantine [facility] TITRATION PAK) tablet pack 5 mg/day for =1 week; 5 mg twice daily for =1 week; 15 mg/day given in 5 mg and 10 mg separated doses for =1 week; then 10 mg twice daily Patient taking differently: Take 5-10 mg by mouth See admin instructions. Take 5 mg by mouth once a day for 7 days, 5 mg two times a day for 7 days, 15 mg once a day (given in 5 mg & 10 mg separated "doses," and 10 mg two times a day thereafter 05/07/19   [name], NP  vitamin B-12 (CYANOCOBALAMIN) 1000 MCG tablet Take 1 tablet (1,000 mcg total) by mouth daily. Patient not taking: Reported on 05/13/2019 08/19/18   [name], NP  Vitamin D, Ergocalciferol, (DRISDOL) 1.25 MG (50000 UT) CAPS capsule Take 1 capsule (50,000 Units total) by mouth every 7 (seven) days. Patient not taking: Reported on 05/13/2019 08/13/18   [name], NP      Allergies:    Allergies  Allergen Reactions  . Aricept [Donepezil] Diarrhea, Nausea And Vomiting and Other (See Comments)    Cannot tolerate- bradycardia, also  . Tropicamide Nausea And Vomiting    Used to dilate eyes (might be this- was switched to something this was tolerable, after this)  . Adhesive [Tape] Rash    Social History:   Social History   Socioeconomic History  . Marital status: Married    Spouse name: Not on file  . Number of children: Not on file  . Years of education: Not on file  . Highest education level: Not on file  Occupational History  . Not on file  Social Needs  . Financial resource strain: Not on file  . Food insecurity    Worry: Not on file    Inability: Not on file  . Transportation needs    Medical: Not on file    Non-medical: Not on file  Tobacco Use  . Smoking status: Former Games developer  . Smokeless tobacco: Never Used  . Tobacco comment: Quit at age 63  Substance and Sexual Activity  . Alcohol use: Never    Frequency: Never  . Drug use: Never  . Sexual activity: Not Currently  Lifestyle  . Physical activity    Days per week: Not on file    Minutes per session: Not on file  . Stress: Not on file  Relationships  . Social Musician on phone: Not on file    Gets together: Not on file    Attends religious service: Not on file    Active member of club or organization: Not on file    Attends meetings of clubs or organizations: Not on file    Relationship status: Not on file  . Intimate partner violence    Fear of current or ex partner: Not on file    Emotionally abused: Not on file    Physically abused: Not on file    Forced sexual activity: Not on file  Other Topics Concern  . Not on file  Social History Narrative   Social History      Diet?       Do you drink/eat things with caffeine? yes      Marital status?          married  What year were you married? 1957      Do you live in a house,  apartment, assisted living, condo, trailer, etc.? home      Is it one or more stories? 1      How many persons live in your home? 4      Do you have any pets in your home? (please list) yes- 3 dogs, 1 cat      Highest level of education completed? 12 yrs + trade school      Current or past profession: Paediatric nurse, Market researcher TV lineman      Do you exercise?           no                           Type & how often?      Advanced Directives      Do you have a living will? yes      Do you have a DNR form?                                  If not, do you want to discuss one? no      Do you have signed POA/HPOA for forms? yes      Functional Status      Do you have difficulty bathing or dressing yourself? no      Do you have difficulty preparing food or eating? no      Do you have difficulty managing your medications? no      Do you have difficulty managing your finances? no      Do you have difficulty affording your medications? Yes xarelto     Family History:   The patient's family history includes Arthritis in his daughter and son; Heart failure in his mother; Hyperlipidemia in his mother and son; Hypertension in his son and son.     Review of Systems: [y] = yes, [ ]  = no   . General: Weight gain [ ] ; Weight loss [ ] ; Anorexia [ ] ; Fatigue [ ] ; Fever [ ] ; Chills [ ] ; Weakness [Y ]  . Cardiac: Chest pain/pressure [ ] ; Resting SOB [ ] ; Exertional SOB [Y ]; Orthopnea [ ] ; Pedal Edema [ ] ; Palpitations [Y ]; Syncope [ ] ; Presyncope [ ] ; Paroxysmal nocturnal dyspnea[ ]   . Pulmonary: Cough [ ] ; Wheezing[ ] ; Hemoptysis[ ] ; Sputum [ ] ; Snoring [ ]   . GI: Vomiting[ ] ; Dysphagia[ ] ; Melena[ ] ; Hematochezia [ ] ; Heartburn[ ] ; Abdominal pain [ ] ; Constipation [ ] ; Diarrhea [ ] ; BRBPR [ ]   . GU: Hematuria[ ] ; Dysuria [ ] ; Nocturia[ ]   . Vascular: Pain in legs with walking [ ] ; Pain in feet with lying flat [ ] ; Non-healing sores [ ] ; Stroke [ ] ; TIA [ ] ; Slurred speech [ ] ;  . Neuro: Headaches[ ] ;  Vertigo[ ] ; Seizures[ ] ; Paresthesias[ ] ;Blurred vision [ ] ; Diplopia [ ] ; Vision changes [ ]   . Ortho/Skin: Arthritis [ ] ; Joint pain [ ] ; Muscle pain [ ] ; Joint swelling [ ] ; Back Pain [ ] ; Rash [ ]   . Psych: Depression[ ] ; Anxiety[ ]   . Heme: Bleeding problems [ ] ; Clotting disorders [ ] ; Anemia [ ]   . Endocrine: Diabetes [ ] ; Thyroid dysfunction[ ]      Physical Exam/Data:   Vitals:   05/13/19 2215 05/13/19 2230 05/13/19 2315 05/13/19 2345  BP: (!) 153/64 (!) 171/76 (!) 137/51 (!) 175/86  Pulse: 84 (!) 38 68 65  Resp: 17 19 16 17   Temp:      TempSrc:      SpO2: 98% 99% 98% 99%   No intake or output data in the 24 hours ending 05/14/19 0031 There were no vitals filed for this visit. There is no height or weight on file to calculate BMI.  General:  Well nourished, well developed, in no acute distress HEENT: normal Lymph: no adenopathy Neck: no JVD Endocrine:  No thryomegaly Vascular: No carotid bruits; FA pulses 2+ bilaterally without bruits  Cardiac:  normal S1, S2; irregularly irregular rhythm Lungs:  clear to auscultation bilaterally, no wheezing, rhonchi or rales  Abd: soft, nontender, no hepatomegaly  Ext: no edema Musculoskeletal:  No deformities, BUE and BLE strength normal and equal Skin: warm and dry  Neuro:  CNs 2-12 intact, no focal abnormalities noted Psych:  Normal affect    Laboratory Data:  Chemistry Recent Labs  Lab 05/13/19 1503  NA 142  K 4.1  CL 106  CO2 25  GLUCOSE 129*  BUN 32*  CREATININE 1.29*  CALCIUM 9.1  GFRNONAA 51*  GFRAA 59*  ANIONGAP 11    Recent Labs  Lab 05/13/19 1503  PROT 7.0  ALBUMIN 3.6  AST 15  ALT 14  ALKPHOS 77  BILITOT 1.0   Hematology Recent Labs  Lab 05/13/19 1503  WBC 6.7  RBC 4.60  HGB 14.4  HCT 44.9  MCV 97.6  MCH 31.3  MCHC 32.1  RDW 13.6  PLT 239   Cardiac EnzymesNo results for input(s): TROPONINI in the last 168 hours. No results for input(s): TROPIPOC in the last 168 hours.  BNPNo results  for input(s): BNP, PROBNP in the last 168 hours.  DDimer No results for input(s): DDIMER in the last 168 hours.  Radiology/Studies:  Dg Chest 2 View  Result Date: 05/13/2019 CLINICAL DATA:  85 year old male with history of chest pain and palpitations. EXAM: CHEST - 2 VIEW COMPARISON:  Chest x-ray 03/13/2019. FINDINGS: Lung volumes are normal. No consolidative airspace disease. No pleural effusions. No pneumothorax. No pulmonary nodule or mass noted. Pulmonary vasculature and the cardiomediastinal silhouette are within normal limits. Atherosclerosis in the thoracic aorta. IMPRESSION: 1.  No radiographic evidence of acute cardiopulmonary disease. 2. Aortic atherosclerosis. Electronically Signed   By: Trudie Reed M.D.   On: 05/13/2019 15:51    Assessment and Plan:   1. Paroxysmal atrial fibrillation currently rate controlled The patient has a history of atrial fibrillation ablation and prior cardioversions who now presents with generalized fatigue for the past 3 days.  He is noted to be in atrial fibrillation with a heart rate in the 40s to 70s.  There was a plan for an outpatient oral load of amiodarone with an elective cardioversion in a few weeks.  However the patient's symptoms were significantly worsening and therefore he decided to come to the emergency department for more urgent care.  -Admit to telemetry -AV nodal blockers as needed for rate control if heart rate > 120 bpm -Continue Xarelto -NPO after midnight for possible cardioversion in the morning  2.  Essential hypertension  -Continue amlodipine 10 mg daily -Continue hydralazine 25 mg twice daily -Continue losartan 100 mg daily   Severity of Illness: The appropriate patient status for this patient is INPATIENT. Inpatient status is judged to be reasonable and necessary in order to provide the required intensity of service to ensure  the patient's safety. The patient's presenting symptoms, physical exam findings, and initial  radiographic and laboratory data in the context of their chronic comorbidities is felt to place them at high risk for further clinical deterioration. Furthermore, it is not anticipated that the patient will be medically stable for discharge from the hospital within 2 midnights of admission. The following factors support the patient status of inpatient.   " The patient's presenting symptoms include generalized weakness. " The worrisome physical exam findings include irregularly irregular rhythm. " The initial radiographic and laboratory data are worrisome because of atrial fibrillation on ECG. " The chronic co-morbidities include sick sinus syndrome, diastolic dysfunction, hypertension and hyperlipidemia.   * I certify that at the point of admission it is my clinical judgment that the patient will require inpatient hospital care spanning beyond 2 midnights from the point of admission due to high intensity of service, high risk for further deterioration and high frequency of surveillance required.*    For questions or updates, please contact [facility] Please consult [email or web address] for contact info under Cardiology/STEMI.    Signed, Lonie Peak, MD  05/14/2019 12:31 AM

## 2019-05-14 NOTE — ED Notes (Signed)
Cardiology at bedside.

## 2019-05-14 NOTE — Anesthesia Procedure Notes (Signed)
Procedure Name: General with mask airway Performed by: Jacinda Kanady B, CRNA Pre-anesthesia Checklist: Patient identified, Emergency Drugs available, Suction available, Patient being monitored and Timeout performed Patient Re-evaluated:Patient Re-evaluated prior to induction Oxygen Delivery Method: Ambu bag Preoxygenation: Pre-oxygenation with 100% oxygen Induction Type: IV induction Ventilation: Mask ventilation without difficulty Placement Confirmation: positive ETCO2 Dental Injury: Teeth and Oropharynx as per pre-operative assessment        

## 2019-05-14 NOTE — Op Note (Addendum)
Procedure: Electrical Cardioversion Indications:  Atrial Fibrillation  Procedure Details:  Consent: Risks of procedure as well as the alternatives and risks of each were explained to the (patient/caregiver).  Consent for procedure obtained.  Time Out: Verified patient identification, verified procedure, site/side was marked, verified correct patient position, special equipment/implants available, medications/allergies/relevent history reviewed, required imaging and test results available.  Performed  Patient placed on cardiac monitor, pulse oximetry, supplemental oxygen as necessary.  Sedation given: Propofol 80 mg IV, Dr. Sabra Heck Pacer pads placed anterior and posterior chest.  Cardioverted 1 time(s).  Cardioversion with synchronized biphasic 120J shock.  Evaluation: Findings: Post procedure EKG shows: initial sinus rhythm. Atrial flutter with mostly 2:1 AV block, 96 bpm, QTc [id number] ms Complications: None Patient did tolerate procedure well.  Admit for dofetilide loading.  Time Spent Directly with the Patient:  30 minutes   Jose Patel 05/14/2019, 2:39 PM

## 2019-05-14 NOTE — ED Notes (Signed)
Dinner Tray Ordered @ 1715.  

## 2019-05-14 NOTE — Anesthesia Postprocedure Evaluation (Signed)
Anesthesia Post Note  Patient: Wilbon Hammond  Procedure(s) Performed: CARDIOVERSION (N/A )     Patient location during evaluation: Endoscopy Anesthesia Type: General Level of consciousness: awake and alert Pain management: pain level controlled Vital Signs Assessment: post-procedure vital signs reviewed and stable Respiratory status: spontaneous breathing, nonlabored ventilation and respiratory function stable Cardiovascular status: blood pressure returned to baseline and stable Postop Assessment: no apparent nausea or vomiting Anesthetic complications: no    Last Vitals:  Vitals:   05/14/19 1515 05/14/19 1530  BP: (!) 158/86   Pulse:  95  Resp:  18  Temp:    SpO2:  98%    Last Pain:  Vitals:   05/14/19 1455  TempSrc:   PainSc: 0-No pain                 Lynda Rainwater

## 2019-05-14 NOTE — Progress Notes (Signed)
Symptomatic atrial fibrillation, despite controlled ventricular rate. History of sinus bradycardia and long PR after cardioversion in August. Probably will need pacemaker. Consider dofetilide if QTc allows. Avoid beta blockers. He has not missed anticoagulant doses.  This procedure has been fully reviewed with the patient and written informed consent has been obtained.  [name], MD, [facility] [phone] office [phone] pager

## 2019-05-14 NOTE — Interval H&P Note (Signed)
History and Physical Interval Note:  05/14/2019 12:29 PM  Jose Patel  has presented today for surgery, with the diagnosis of Afib.  The various methods of treatment have been discussed with the patient and family. After consideration of risks, benefits and other options for treatment, the patient has consented to  Procedure(s): CARDIOVERSION (N/A) as a surgical intervention.  The patient's history has been reviewed, patient examined, no change in status, stable for surgery.  I have reviewed the patient's chart and labs.  Questions were answered to the patient's satisfaction.     Askia Hazelip

## 2019-05-14 NOTE — Progress Notes (Signed)
Progress Note  Patient Name: Jose Patel Date of Encounter: 05/14/2019  Primary Cardiologist: [name], MD   Subjective  O/N events: HR stable.  Mr.Jose Patel was examined and evaluated at bedside this AM in the ED. He was observed resting comfortably in bed. His daughter is at bedside. He states that about 3 days prior he starting experiencing significant dyspnea, malaise and fatigue similar to how he felt w/ prior episodes of A.fib. He has a new smart watch that can show his heart rate and he saw that his HR seemed irregular. Denies any episodes of RVR. Currently is comfortable. Denies any chest pain, palpitations, dyspnea.  Inpatient Medications    Scheduled Meds: . amLODipine  10 mg Oral Daily  . hydrALAZINE  25 mg Oral BID  . losartan  100 mg Oral Daily  . rivaroxaban  20 mg Oral QAC supper  . sodium chloride flush  3 mL Intravenous Once   Continuous Infusions:  PRN Meds: acetaminophen, Melatonin, ondansetron (ZOFRAN) IV   Vital Signs    Vitals:   05/14/19 0645 05/14/19 0700 05/14/19 0715 05/14/19 0800  BP: (!) 159/75 (!) 183/102 (!) 152/88   Pulse: 88 79 94   Resp:      Temp:      TempSrc:      SpO2: 98% 99% 98%   Weight:    78.9 kg    Intake/Output Summary (Last 24 hours) at 05/14/2019 0925 Last data filed at 05/14/2019 [id number] Gross per 24 hour  Intake -  Output 900 ml  Net -900 ml   Filed Weights   05/14/19 0800  Weight: 78.9 kg    Telemetry    A.fib HR consistently in 70-90s. No RVR - Personally Reviewed  ECG    Irregularly irregular, no significant ST changes. HR in 76 - Personally Reviewed  Physical Exam   Gen: Well-developed, well nourished, NAD HEENT: NCAT head, hearing intact, EOMI Neck: supple, ROM intact, no JVD CV: Irregularly irregular, S1, S2 normal, No rubs, no murmurs Pulm: CTAB, No rales, no wheezes Abd: Soft, BS+, NTND Extm: ROM intact, Peripheral pulses intact, No peripheral edema Skin: Dry, Warm, normal turgor, no  rashes, lesions, wounds.  Neuro: AAOx3  Labs    Chemistry Recent Labs  Lab 05/13/19 1503  NA 142  K 4.1  CL 106  CO2 25  GLUCOSE 129*  BUN 32*  CREATININE 1.29*  CALCIUM 9.1  PROT 7.0  ALBUMIN 3.6  AST 15  ALT 14  ALKPHOS 77  BILITOT 1.0  GFRNONAA 51*  GFRAA 59*  ANIONGAP 11     Hematology Recent Labs  Lab 05/13/19 1503  WBC 6.7  RBC 4.60  HGB 14.4  HCT 44.9  MCV 97.6  MCH 31.3  MCHC 32.1  RDW 13.6  PLT 239    Cardiac EnzymesNo results for input(s): TROPONINI in the last 168 hours. No results for input(s): TROPIPOC in the last 168 hours.   BNPNo results for input(s): BNP, PROBNP in the last 168 hours.   DDimer No results for input(s): DDIMER in the last 168 hours.   Radiology    Dg Chest 2 View  Result Date: 05/13/2019 CLINICAL DATA:  85 year old male with history of chest pain and palpitations. EXAM: CHEST - 2 VIEW COMPARISON:  Chest x-ray 03/13/2019. FINDINGS: Lung volumes are normal. No consolidative airspace disease. No pleural effusions. No pneumothorax. No pulmonary nodule or mass noted. Pulmonary vasculature and the cardiomediastinal silhouette are within normal limits. Atherosclerosis in the thoracic aorta.  IMPRESSION: 1.  No radiographic evidence of acute cardiopulmonary disease. 2. Aortic atherosclerosis. Electronically Signed   By: Vinnie Langton M.D.   On: 05/13/2019 15:51    Cardiac Studies   12/26/18 TEE IMPRESSIONS  1. The left ventricle has normal systolic function, with an ejection fraction of 60-65%. There is mildly increased left ventricular wall thickness. No evidence of left ventricular regional wall motion abnormalities.  2. The right ventricle has normal systolc function. The cavity was normal. There is no increase in right ventricular wall thickness.  3. Left atrial size was moderately dilated.  4. Right atrial size was mildly dilated.  5. The mitral valve is abnormal. Mild thickening of the mitral valve leaflet.  6. The  tricuspid valve was grossly normal.  7. The aortic valve is tricuspid Mild sclerosis of the aortic valve. Aortic valve regurgitation is moderate by color flow Doppler.  8. There is moderate dilatation of the ascending aorta measuring 44 mm.  Patient Profile     85 y.o. male w/ PMH of PAF on Xarelto, CAD, HFpEF, T2DM, sinus bradycardia who presents for symptomatic A.fib.  Assessment & Plan    Paroxysmal A.fib not in RVR Hx of repeated symptomatic A.fib w/ prior hx of failed cardioversion + Ablation. Was planned for management in outpatient setting w/ amiodarone loading and cardioversion. Presented to ED due to malaise and fatigue. No RVR event. - C/w telemetry  - C/w Xarelto - Plan for cardioversion today - Had recurrence with amiodarone in the past. Plan for Dofetilide loading this admission  HFpEF Echo on 5/20 w/ EF 60-65%. Euvolemic on exam. Chest X-ray w/o pulm edema. No oxygen requirement. - Daily weight - Monitor vitals  HTN BP elevated this am 150/70. On amlodipine, losartan and hydralazine at home. - C/w home meds: amlodipine 10mg  daily, losartan 100mg  daily, hydralazine 25mg  BID  For questions or updates, please contact [place] Please consult [email or web address] for contact info under Cardiology/STEMI.   Signed, Gilberto Better, MD PGY-2, [place] [facility] Pager: [phone] 05/14/2019, 9:25 AM    Attending attestation to follow

## 2019-05-14 NOTE — Anesthesia Preprocedure Evaluation (Addendum)
Anesthesia Evaluation  Patient identified by MRN, date of birth, ID band Patient awake    Reviewed: Allergy & Precautions, NPO status , Patient's Chart, lab work & pertinent test results  Airway Mallampati: III  TM Distance: >3 FB Neck ROM: Full    Dental  (+) Missing, Dental Advisory Given   Pulmonary sleep apnea , former smoker,    Pulmonary exam normal breath sounds clear to auscultation       Cardiovascular hypertension, Pt. on medications and Pt. on home beta blockers + CAD, + Past MI and +CHF  Normal cardiovascular exam+ dysrhythmias Atrial Fibrillation + Valvular Problems/Murmurs  Rhythm:Regular Rate:Normal     Neuro/Psych Radiculopathy, lumbar region TIACVA negative psych ROS   GI/Hepatic Neg liver ROS, GERD  ,  Endo/Other  diabetes  Renal/GU negative Renal ROS     Musculoskeletal negative musculoskeletal ROS (+)   Abdominal   Peds  Hematology HLD   Anesthesia Other Findings A-FIB  Reproductive/Obstetrics                             Anesthesia Physical  Anesthesia Plan  ASA: III  Anesthesia Plan: General   Post-op Pain Management:    Induction: Intravenous  PONV Risk Score and Plan: 1 and Propofol infusion and Treatment may vary due to age or medical condition  Airway Management Planned: Mask  Additional Equipment:   Intra-op Plan:   Post-operative Plan:   Informed Consent: I have reviewed the patients History and Physical, chart, labs and discussed the procedure including the risks, benefits and alternatives for the proposed anesthesia with the patient or authorized representative who has indicated his/her understanding and acceptance.     Dental advisory given  Plan Discussed with: CRNA  Anesthesia Plan Comments:        Anesthesia Quick Evaluation

## 2019-05-15 ENCOUNTER — Encounter (HOSPITAL_COMMUNITY): Payer: Self-pay | Admitting: Cardiovascular Disease

## 2019-05-15 DIAGNOSIS — Z5181 Encounter for therapeutic drug level monitoring: Secondary | ICD-10-CM

## 2019-05-15 DIAGNOSIS — Z79899 Other long term (current) drug therapy: Secondary | ICD-10-CM

## 2019-05-15 LAB — BASIC METABOLIC PANEL
Anion gap: 8 (ref 5–15)
BUN: 36 mg/dL — ABNORMAL HIGH (ref 8–23)
CO2: 26 mmol/L (ref 22–32)
Calcium: 8.8 mg/dL — ABNORMAL LOW (ref 8.9–10.3)
Chloride: 105 mmol/L (ref 98–111)
Creatinine, Ser: 1.43 mg/dL — ABNORMAL HIGH (ref 0.61–1.24)
GFR calc Af Amer: 52 mL/min — ABNORMAL LOW (ref >60)
GFR calc non Af Amer: 45 mL/min — ABNORMAL LOW (ref >60)
Glucose, Bld: 118 mg/dL — ABNORMAL HIGH (ref 70–99)
Potassium: 3.8 mmol/L (ref 3.5–5.1)
Sodium: 139 mmol/L (ref 135–145)

## 2019-05-15 LAB — MAGNESIUM: Magnesium: 2.5 mg/dL — ABNORMAL HIGH (ref 1.7–2.4)

## 2019-05-15 MED ORDER — POTASSIUM CHLORIDE CRYS ER 20 MEQ PO TBCR
40.0000 meq | EXTENDED_RELEASE_TABLET | Freq: Once | ORAL | Status: AC
  Administered 2019-05-15: 14:00:00 40 meq via ORAL
  Filled 2019-05-15: qty 2

## 2019-05-15 MED ORDER — SODIUM CHLORIDE 0.9% FLUSH: 3.0000 mL | INTRAVENOUS | Status: DC | PRN

## 2019-05-15 MED ORDER — SODIUM CHLORIDE 0.9% FLUSH
3.0000 mL | Freq: Two times a day (BID) | INTRAVENOUS | Status: DC
  Administered 2019-05-15 – 2019-05-16 (×3): 3 mL via INTRAVENOUS

## 2019-05-15 NOTE — Progress Notes (Signed)
Progress Note  Patient Name: Strummer Polman Date of Encounter: 05/15/2019  Primary Cardiologist: [name], MD   Subjective   Feeling much better.  Waning sinus rhythm/mild sinus bradycardia.  QTc 470-480 ms at heart rates in the 50s.  Occasional PVCs  Inpatient Medications    Scheduled Meds: . amLODipine  10 mg Oral Daily  . dofetilide  250 mcg Oral BID  . hydrALAZINE  25 mg Oral BID  . losartan  100 mg Oral Daily  . rivaroxaban  20 mg Oral QAC supper  . sodium chloride flush  3 mL Intravenous Once  . sodium chloride flush  3 mL Intravenous Q12H  . sodium chloride flush  3 mL Intravenous Q12H   Continuous Infusions: . sodium chloride    . sodium chloride     PRN Meds: sodium chloride, acetaminophen, Melatonin, ondansetron (ZOFRAN) IV, sodium chloride flush, sodium chloride flush   Vital Signs    Vitals:   05/14/19 2300 05/14/19 2337 05/15/19 0500 05/15/19 0822  BP: 140/65 (!) 160/60 (!) 132/57 (!) 141/68  Pulse: (!) 50 (!) 51 75   Resp: 16 17 17    Temp:  98.6 F (37 C) 97.8 F (36.6 C)   TempSrc:  Oral Oral   SpO2: 97% 96% 96%   Weight:  75.5 kg    Height:  5\' 9"  (1.753 m)      Intake/Output Summary (Last 24 hours) at 05/15/2019 0846 Last data filed at 05/14/2019 2122 Gross per 24 hour  Intake 50 ml  Output -  Net 50 ml   Last 3 Weights 05/14/2019 05/14/2019 05/14/2019  Weight (lbs) 166 lb 6.4 oz 171 lb 174 lb  Weight (kg) 75.479 kg 77.565 kg 78.926 kg      Telemetry    Sinus bradycardia with occasional PVCs- Personally Reviewed  ECG    Sinus bradycardia, QTC in acceptable range- Personally Reviewed  Physical Exam  Appears well GEN: No acute distress.   Neck: No JVD Cardiac: RRR, no murmurs, rubs, or gallops.  Respiratory: Clear to auscultation bilaterally. GI: Soft, nontender, non-distended  MS: No edema; No deformity. Neuro:  Nonfocal  Psych: Normal affect   Labs    High Sensitivity Troponin:   Recent Labs  Lab 05/13/19 1503  05/13/19 2312  TROPONINIHS 10 13      Chemistry Recent Labs  Lab 05/13/19 1503 05/14/19 1722  NA 142 140  K 4.1 4.1  CL 106 102  CO2 25 26  GLUCOSE 129* 166*  BUN 32* 23  CREATININE 1.29* 1.27*  CALCIUM 9.1 8.9  PROT 7.0  --   ALBUMIN 3.6  --   AST 15  --   ALT 14  --   ALKPHOS 77  --   BILITOT 1.0  --   GFRNONAA 51* 52*  GFRAA 59* >60  ANIONGAP 11 12     Hematology Recent Labs  Lab 05/13/19 1503  WBC 6.7  RBC 4.60  HGB 14.4  HCT 44.9  MCV 97.6  MCH 31.3  MCHC 32.1  RDW 13.6  PLT 239    BNPNo results for input(s): BNP, PROBNP in the last 168 hours.   DDimer No results for input(s): DDIMER in the last 168 hours.   Radiology    Dg Chest 2 View  Result Date: 05/13/2019 CLINICAL DATA:  85 year old male with history of chest pain and palpitations. EXAM: CHEST - 2 VIEW COMPARISON:  Chest x-ray 03/13/2019. FINDINGS: Lung volumes are normal. No consolidative airspace disease. No pleural  effusions. No pneumothorax. No pulmonary nodule or mass noted. Pulmonary vasculature and the cardiomediastinal silhouette are within normal limits. Atherosclerosis in the thoracic aorta. IMPRESSION: 1.  No radiographic evidence of acute cardiopulmonary disease. 2. Aortic atherosclerosis. Electronically Signed   By: Vinnie Langton M.D.   On: 05/13/2019 15:51    Cardiac Studies   Synchronized cardioversion 120 J yesterday led to atrial flutter with 2: 1 AV block, subsequent conversion to sinus bradycardia  Patient Profile     86 y.o. male with recurrent symptomatic paroxysmal atrial fibrillation and mild sinus bradycardia, status post cardioversion and admission for dofetilide initiation.  Assessment & Plan    Continue with dofetilide administration.  His fifth dose will be due Friday afternoon/evening.  We will try to gradually move the administration time from 0800h-2000h to earlier time so that we can administer Friday evening dose at 1800 h., prior to anticipated  discharge.  Reviewed the need for careful administration of the drug every 12 hours at home, the need to avoid any missed doses, multiple drug interactions.  For questions or updates, please contact [place] Please consult [email or web address] for contact info under        Signed, [name], MD  05/15/2019, 8:46 AM

## 2019-05-15 NOTE — TOC Benefit Eligibility Note (Signed)
Transition of Care [facility]) Benefit Eligibility Note    Patient Details  Name: Jose Patel MRN: [id number] Date of Birth: October 14, 1933   Medication/Dose: DOFETILIDE 250 MCG BID  Covered?: Yes  Tier: (TIER- 4 DRUG)  Prescription Coverage Preferred Pharmacy: [facility]  Spoke with Person/Company/Phone Number:: AMBER  @ [employer] # [phone] OPT- MEMBER  Co-Pay: $49.90     Deductible: Met  Additional Notes: TIKOSYN 250 MCG , NOT COVER , P/A -YES # [phone]    Memory Argue Phone Number: 05/15/2019, 12:47 PM

## 2019-05-16 ENCOUNTER — Telehealth: Payer: Self-pay | Admitting: Cardiovascular Disease

## 2019-05-16 DIAGNOSIS — I10 Essential (primary) hypertension: Secondary | ICD-10-CM

## 2019-05-16 DIAGNOSIS — R001 Bradycardia, unspecified: Secondary | ICD-10-CM

## 2019-05-16 LAB — BASIC METABOLIC PANEL
Anion gap: 6 (ref 5–15)
BUN: 34 mg/dL — ABNORMAL HIGH (ref 8–23)
CO2: 22 mmol/L (ref 22–32)
Calcium: 7.8 mg/dL — ABNORMAL LOW (ref 8.9–10.3)
Chloride: 109 mmol/L (ref 98–111)
Creatinine, Ser: 1.28 mg/dL — ABNORMAL HIGH (ref 0.61–1.24)
GFR calc Af Amer: 60 mL/min — ABNORMAL LOW (ref >60)
GFR calc non Af Amer: 51 mL/min — ABNORMAL LOW (ref >60)
Glucose, Bld: 100 mg/dL — ABNORMAL HIGH (ref 70–99)
Potassium: 4.3 mmol/L (ref 3.5–5.1)
Sodium: 137 mmol/L (ref 135–145)

## 2019-05-16 LAB — MAGNESIUM: Magnesium: 2.2 mg/dL (ref 1.7–2.4)

## 2019-05-16 MED ORDER — AMIODARONE HCL 200 MG PO TABS: 200.0000 mg | ORAL_TABLET | Freq: Every day | ORAL | 6 refills | Status: DC

## 2019-05-16 NOTE — Progress Notes (Addendum)
Progress Note  Patient Name: Jose Patel Date of Encounter: 05/16/2019  Primary Cardiologist: [name], MD   Subjective  O/N events: Bradycardic w/ HR 44-47 (not new)  Jose Patel was examined and evaluated at bedside this AM. He was observed resting comfortably in bed. He states he is anxious to go back home to care for his wife who has dementia. Denies any significant fatigue, dyspnea, chest pain, or light-headedness. Discussed need for observation until Tikosyn can reach steady-state. Jose Patel expressed understanding.   Inpatient Medications    Scheduled Meds: . amLODipine  10 mg Oral Daily  . dofetilide  250 mcg Oral BID  . hydrALAZINE  25 mg Oral BID  . losartan  100 mg Oral Daily  . rivaroxaban  20 mg Oral QAC supper  . sodium chloride flush  3 mL Intravenous Q12H   Continuous Infusions:  PRN Meds: acetaminophen, Melatonin, ondansetron (ZOFRAN) IV, sodium chloride flush   Vital Signs    Vitals:   05/15/19 0825 05/15/19 1136 05/15/19 2010 05/16/19 0413  BP: (!) 141/68 (!) 144/52 (!) 157/56 (!) 159/63  Pulse:  (!) 49 (!) 47 (!) 45  Resp: 15 18 18 15   Temp:  97.9 F (36.6 C) 98.2 F (36.8 C) 98.2 F (36.8 C)  TempSrc:  Oral Oral Oral  SpO2:  99% 98% 97%  Weight:      Height:        Intake/Output Summary (Last 24 hours) at 05/16/2019 0850 Last data filed at 05/15/2019 2000 Gross per 24 hour  Intake 120 ml  Output -  Net 120 ml   Filed Weights   05/14/19 0800 05/14/19 1416 05/14/19 2337  Weight: 78.9 kg 77.6 kg 75.5 kg    Telemetry    Sinus bradycardia w/ HR consistently in 40-50s - Personally Reviewed  ECG    Sinus bradycardia, QTc 477  - Personally Reviewed  Physical Exam   GEN: No acute distress.   Neck: No JVD Cardiac: Bradycardic, regular rhythm, no murmurs, rubs, or gallops.  Respiratory: Clear to auscultation bilaterally. GI: Soft, nontender, non-distended  MS: No edema; No deformity. Neuro:  Nonfocal  Psych: Normal affect    Labs    Chemistry Recent Labs  Lab 05/13/19 1503 05/14/19 1722 05/15/19 0801 05/16/19 0218  NA 142 140 139 137  K 4.1 4.1 3.8 4.3  CL 106 102 105 109  CO2 25 26 26 22   GLUCOSE 129* 166* 118* 100*  BUN 32* 23 36* 34*  CREATININE 1.29* 1.27* 1.43* 1.28*  CALCIUM 9.1 8.9 8.8* 7.8*  PROT 7.0  --   --   --   ALBUMIN 3.6  --   --   --   AST 15  --   --   --   ALT 14  --   --   --   ALKPHOS 77  --   --   --   BILITOT 1.0  --   --   --   GFRNONAA 51* 52* 45* 51*  GFRAA 59* >60 52* 60*  ANIONGAP 11 12 8 6      Hematology Recent Labs  Lab 05/13/19 1503  WBC 6.7  RBC 4.60  HGB 14.4  HCT 44.9  MCV 97.6  MCH 31.3  MCHC 32.1  RDW 13.6  PLT 239    Cardiac EnzymesNo results for input(s): TROPONINI in the last 168 hours. No results for input(s): TROPIPOC in the last 168 hours.   BNPNo results for input(s): BNP, PROBNP in the last  168 hours.   DDimer No results for input(s): DDIMER in the last 168 hours.   Radiology    No results found.  Cardiac Studies   Synchronized cardioversion 120 J yesterday led to atrial flutter with 2: 1 AV block, subsequent conversion to sinus bradycardia  Patient Profile     85 y.o. male presenting w/ recurrent symptomatic paroxysmal atrial fibrillation and mild sinus bradycardia, status post cardioversion and admission for dofetilide initiation.  Assessment & Plan    Paroxysmal A.fib Presenting w/ recurrent symptomatic A.fib. Chads-vasc score of 3 due to age and HTN. Currently in sinus s/p cardioversion. On dofetilide. Received 4th dose this am. - C/w dofetilde. If stable after evening dose, can be discharged - C/w Xarelto  For questions or updates, please contact [place] Please consult [email or web address] for contact info under Cardiology/STEMI.     Signed, Gilberto Better, MD PGY-2, [place] [facility] Pager: [phone] 05/16/2019, 8:50 AM    Attending attestation to follow  I have seen and examined the patient along with Gilberto Better, MD.  I have reviewed the chart, notes and new data.  I agree with his note.  Key new complaints: He feels great.  No cardiovascular complaints Key examination changes: Bradycardic, otherwise normal cardiovascular exam. Key new findings / data: No atrial fibrillation, but has persistent bradycardia on telemetry.  ECG shows excessive QTc interval prolongation.  PLAN: Unfortunately, dofetilide would not be a safe option for him.  It has been discontinued. Atrial fibrillation recurrence is likely, he has already required 3 cardioversions this year. We will go back to our previous plan which was to start amiodarone as an outpatient, after we allow for a 5-day washout of the dofetilide (start amiodarone on Wednesday, October 21)..  Due to bradycardia will avoid loading dose and start at only 200 mg once daily.  It is highly likely he will require pacemaker implantation since he will develop symptomatic bradycardia.  We have already reviewed this as well as the implantation procedure, risks/complications/benefits, with both Jose Patel and his daughter, Jose Patel. We will make arrangements for a transition of care visit in roughly 2 weeks (this can be a video visit), and a visit in the office in 2-3 months. Continue Xarelto without interruption, barring any truly serious bleeding complications.  This is critical in the next 30 days.  [name], MD, [place] [phone] 05/16/2019, 9:55 AM

## 2019-05-16 NOTE — Care Management Important Message (Signed)
Important Message  Patient Details  Name: Jose Patel MRN: [id number] Date of Birth: 12-20-1933   Medicare Important Message Given:  Yes     [name] 05/16/2019, 12:11 PM

## 2019-05-16 NOTE — Discharge Summary (Signed)
Discharge Summary    Patient ID: Jose Patel MRN: [id number]; DOB: 1933/08/10  Admit date: 05/13/2019 Discharge date: 05/16/2019  Primary Care Provider: [name], NP  Primary Cardiologist: Thurmon Fair, MD   Discharge Diagnoses    Active Problems:   Paroxysmal A-fib (HCC)   HTN   HLD  Chronic diastolic CHF  Allergies Allergies  Allergen Reactions  . Aricept [Donepezil] Diarrhea, Nausea And Vomiting and Other (See Comments)    Cannot tolerate- bradycardia, also  . Tropicamide Nausea And Vomiting    Used to dilate eyes (might be this- was switched to something this was tolerable, after this)  . Adhesive [Tape] Rash    Diagnostic Studies/Procedures     Date: 05/14/2019 Procedure: Electrical Cardioversion Indications:  Atrial Fibrillation  Procedure Details:  Consent: Risks of procedure as well as the alternatives and risks of each were explained to the (patient/caregiver).  Consent for procedure obtained.  Time Out: Verified patient identification, verified procedure, site/side was marked, verified correct patient position, special equipment/implants available, medications/allergies/relevent history reviewed, required imaging and test results available.  Performed  Patient placed on cardiac monitor, pulse oximetry, supplemental oxygen as necessary.  Sedation given: Propofol 80 mg IV, Dr. Hyacinth Meeker Pacer pads placed anterior and posterior chest.  Cardioverted 1 time(s).  Cardioversion with synchronized biphasic 120J shock.  Evaluation: Findings: Post procedure EKG shows: initial sinus rhythm. Atrial flutter with mostly 2:1 AV block, 96 bpm, QTc 450 ms Complications: None Patient did tolerate procedure well.  Admit for dofetilide loading.   History of Present Illness     Jamisen Patel is a 85 y.o. male with a history of paroxysmal atrial fibrillation (on Xarelto) s/p radiofrequency ablation, diastolic heart failure, aortic insufficiency, CVA,  hypertension, hyperlipidemia, GERD, OSA, and back pain admitted for symptomatic recurrent afib.  He has a history of atrial fibrillation for which he underwent a radiofrequency ablation in 2016. Unfortunately he had recurrence of his atrial arrhythmia.  He has been cardioverted for his complaints of generalized weakness but revert back.  In the more recent past the patient was in sinus rhythm but went back into atrial fibrillation on 05/10/2019. However he presented 05/14/2019 with 3 days hx of increased fatigue and exertional shortness of breath.   He is noted to be in atrial fibrillation with a heart rate in the 40s to 70s.    Hospital Course     Consultants: None  He was admitted for symptomatic A.fib despite controlled ventricular rate. He was cardioverted 05/14/2019  @ 120 J with resultant atrial flutter with 2:1 AV block with subsequent spontaneous conversion to sinus bradycardia. He was started dofetilide administration with QT monitoring. On 4th dose of dofetilide, QT noted to be @ 570. Dofetilide was discontinued as it was not safe option. Atrial fibrillation recurrence is likely, he has already required 3 cardioversions this year.  He was discharged with instructions to start amiodarone 200mg  daily after 5 day washout of dofetilide and scheduled for close follow up with cardiology. (start amiodarone on Wednesday, October 21). Per Dr. Royann Shivers "It is highly likely he will require pacemaker implantation since he will develop symptomatic bradycardia.  We have already reviewed this as well as the implantation procedure, risks/complications/benefits, with both Elijah Birk and his daughter, [name]". Continue Xarelto for anticoagulation.   Did the patient have an acute coronary syndrome (MI, NSTEMI, STEMI, etc) this admission?:  No  Did the patient have a percutaneous coronary intervention (stent / angioplasty)?:  No.   _____________  Discharge Vitals Blood pressure (!) 170/55,  pulse (!) 45, temperature 98.2 F (36.8 C), temperature source Oral, resp. rate 15, height 5\' 9"  (1.753 m), weight 75.5 kg, SpO2 97 %.  Filed Weights   05/14/19 0800 05/14/19 1416 05/14/19 2337  Weight: 78.9 kg 77.6 kg 75.5 kg    Labs & Radiologic Studies    CBC Recent Labs    05/13/19 1503  WBC 6.7  HGB 14.4  HCT 44.9  MCV 97.6  PLT 239   Basic Metabolic Panel Recent Labs    78/29/56 0801 05/16/19 0218  NA 139 137  K 3.8 4.3  CL 105 109  CO2 26 22  GLUCOSE 118* 100*  BUN 36* 34*  CREATININE 1.43* 1.28*  CALCIUM 8.8* 7.8*  MG 2.5* 2.2   Liver Function Tests Recent Labs    05/13/19 1503  AST 15  ALT 14  ALKPHOS 77  BILITOT 1.0  PROT 7.0  ALBUMIN 3.6   High Sensitivity Troponin:   Recent Labs  Lab 05/13/19 1503 05/13/19 2312  TROPONINIHS 10 13    _____________  Dg Chest 2 View  Result Date: 05/13/2019 CLINICAL DATA:  85 year old male with history of chest pain and palpitations. EXAM: CHEST - 2 VIEW COMPARISON:  Chest x-ray 03/13/2019. FINDINGS: Lung volumes are normal. No consolidative airspace disease. No pleural effusions. No pneumothorax. No pulmonary nodule or mass noted. Pulmonary vasculature and the cardiomediastinal silhouette are within normal limits. Atherosclerosis in the thoracic aorta. IMPRESSION: 1.  No radiographic evidence of acute cardiopulmonary disease. 2. Aortic atherosclerosis. Electronically Signed   By: Trudie Reed M.D.   On: 05/13/2019 15:51   Disposition   Pt is being discharged home today in good condition.  Follow-up Plans & Appointments    Follow-up Information    Jodelle Gross, NP Follow up on 05/29/2019.   Specialties: Nurse Practitioner, Radiology, Cardiology Why: @9 :30 am for virtual video visit Contact information: [address] [place] [address] [phone]        Thurmon Fair, MD. Go on 07/23/2019.   Specialty: Cardiology Why: @11am  for 2-3 months follow up  Contact  information: [address] [place] [address] [phone]          Discharge Instructions    Diet - low sodium heart healthy   Complete by: As directed    Increase activity slowly   Complete by: As directed       Discharge Medications   Allergies as of 05/16/2019      Reactions   Aricept [donepezil] Diarrhea, Nausea And Vomiting, Other (See Comments)   Cannot tolerate- bradycardia, also   Tropicamide Nausea And Vomiting   Used to dilate eyes (might be this- was switched to something this was tolerable, after this)   Adhesive [tape] Rash      Medication List    TAKE these medications   amiodarone 200 MG tablet Commonly known as: Pacerone Take 1 tablet (200 mg total) by mouth daily. Start taking on: May 21, 2019   amLODipine 10 MG tablet Commonly known as: NORVASC TAKE 1 TABLET BY MOUTH EVERY DAY   amoxicillin 500 MG capsule Commonly known as: AMOXIL Take 2,000 mg by mouth See admin instructions. Take 2,000 mg by mouth one hour prior to dental appointment   gabapentin 100 MG capsule Commonly known as: NEURONTIN Take 1 capsule (100 mg total) by  mouth daily as needed. What changed: reasons to take this   hydrALAZINE 25 MG tablet Commonly known as: APRESOLINE Take 1 tablet (25 mg total) by mouth 2 (two) times daily.   losartan 50 MG tablet Commonly known as: COZAAR Take 2 tablets (100 mg total) by mouth daily.   Melatonin 5 MG Tabs Take 5 mg by mouth at bedtime as needed (for sleep).   memantine tablet pack Commonly known as: Namenda Titration Pak 5 mg/day for =1 week; 5 mg twice daily for =1 week; 15 mg/day given in 5 mg and 10 mg separated doses for =1 week; then 10 mg twice daily What changed:   how much to take  how to take this  when to take this  additional instructions   metoprolol tartrate 25 MG tablet Commonly known as: LOPRESSOR Take 1 tablet (25 mg total) by mouth as needed (Once daily as needed for a heart rate  over 100 bpm.). What changed:   when to take this  reasons to take this   Red Yeast Rice 600 MG Caps Take 600 mg by mouth 2 (two) times a day.   vitamin B-12 1000 MCG tablet Commonly known as: CYANOCOBALAMIN Take 1 tablet (1,000 mcg total) by mouth daily.   Vitamin D (Ergocalciferol) 1.25 MG (50000 UT) Caps capsule Commonly known as: DRISDOL Take 1 capsule (50,000 Units total) by mouth every 7 (seven) days.   Vitamin D3 50 MCG (2000 UT) Tabs Take 2,000 Units by mouth daily with breakfast.   Xarelto 20 MG Tabs tablet Generic drug: rivaroxaban TAKE 1 TABLET (20 MG TOTAL) BY MOUTH DAILY WITH SUPPER. What changed: See the new instructions.          Outstanding Labs/Studies   None  Duration of Discharge Encounter   Greater than 30 minutes including physician time.  Lorelei Pont, PA 05/16/2019, 1:39 PM

## 2019-05-16 NOTE — Telephone Encounter (Signed)
New message   Per Cephus Shelling Bhagat scheduled TOC with Jory Sims on 05/29/19 at 9:30am.

## 2019-05-16 NOTE — Discharge Instructions (Signed)

## 2019-05-16 NOTE — Progress Notes (Signed)
Pt was discharged today with daughter to home.  Pt's IV was removed and Pt taken off of telemetry.  Pt left with all of their personal belongings.  AVS documentation was reviewed with the Pt and all questions were answered.

## 2019-05-16 NOTE — Telephone Encounter (Signed)
Patient discharged today - first TOC should be 05/19/2019

## 2019-05-16 NOTE — TOC Transition Note (Signed)
Transition of Care [facility]) - CM/SW Discharge Note Marvetta Gibbons RN, BSN Transitions of Care Unit 4E- RN Case Manager [phone]   Patient Details  Name: Jose Patel MRN: [id number] Date of Birth: 01-May-1934  Transition of [facility]) CM/SW Contact:  Dawayne Patricia, RN Phone Number: 05/16/2019, 2:49 PM   Clinical Narrative:    Pt stable for transition home, Tikosyn has been discontinued- verbal order given by PA to resume HHPT/OT as previously ordered- Call made to Dorian Pod with [facility] to resume [facility] services on discharge. Daughter to provide transportation home   Final next level of care: [place] Barriers to Discharge: Barriers Resolved   Patient Goals and CMS Choice Patient states their goals for this hospitalization and ongoing recovery are:: return home CMS Medicare.gov Compare Post Acute Care list provided to:: Patient Represenative (must comment) Choice offered to / list presented to : Adult Children  Discharge Placement             Home with [place].           Discharge Plan and Services   Discharge Planning Services: CM Consult Post Acute Care Choice: Home Health, Resumption of Svcs/PTA Provider          DME Arranged: N/A DME Agency: NA         HH Agency: [facility] Date [place]: 05/16/19 Time [place]: 1448 Representative spoke with at [place]: [place] ([place]) Interventions     Readmission Risk Interventions Readmission Risk Prevention Plan 05/16/2019  Transportation Screening Complete  PCP or Specialist Appt within 5-7 Days Complete  Home Care Screening Complete  Medication Review (RN CM) Complete  Some recent data might be hidden

## 2019-05-16 NOTE — Progress Notes (Signed)
Spoke to R Barrett about pt's HR 44-47 QTC .[phone] she said to hold Tikosyn for now

## 2019-05-19 NOTE — Telephone Encounter (Signed)
Attempted to contact patient to discuss TOC appointment. LVM to call back- left call back number.

## 2019-05-20 NOTE — Telephone Encounter (Signed)
Patient's daughter [name] returned call.

## 2019-05-20 NOTE — Telephone Encounter (Signed)
Attempted to contact patient to discuss TOC appointment.  LVM with call back number.

## 2019-05-20 NOTE — Telephone Encounter (Signed)
Patient contacted regarding discharge from [place] on 05/16/2019.  Patient understands to follow up with provider lawrence on 05/29/2019 at 9:30 am at virtual. Patient understands discharge instructions? yes Patient understands medications and regiment? yes Patient understands to bring all medications to this visit? yes

## 2019-05-23 DIAGNOSIS — I483 Typical atrial flutter: Secondary | ICD-10-CM | POA: Diagnosis not present

## 2019-05-23 DIAGNOSIS — R69 Illness, unspecified: Secondary | ICD-10-CM | POA: Diagnosis not present

## 2019-05-23 DIAGNOSIS — I2510 Atherosclerotic heart disease of native coronary artery without angina pectoris: Secondary | ICD-10-CM | POA: Diagnosis not present

## 2019-05-23 DIAGNOSIS — I5030 Unspecified diastolic (congestive) heart failure: Secondary | ICD-10-CM | POA: Diagnosis not present

## 2019-05-23 DIAGNOSIS — I480 Paroxysmal atrial fibrillation: Secondary | ICD-10-CM | POA: Diagnosis not present

## 2019-05-23 DIAGNOSIS — E1140 Type 2 diabetes mellitus with diabetic neuropathy, unspecified: Secondary | ICD-10-CM | POA: Diagnosis not present

## 2019-05-23 DIAGNOSIS — M5116 Intervertebral disc disorders with radiculopathy, lumbar region: Secondary | ICD-10-CM | POA: Diagnosis not present

## 2019-05-23 DIAGNOSIS — E538 Deficiency of other specified B group vitamins: Secondary | ICD-10-CM | POA: Diagnosis not present

## 2019-05-23 DIAGNOSIS — I110 Hypertensive heart disease with heart failure: Secondary | ICD-10-CM | POA: Diagnosis not present

## 2019-05-23 DIAGNOSIS — M4126 Other idiopathic scoliosis, lumbar region: Secondary | ICD-10-CM | POA: Diagnosis not present

## 2019-05-26 ENCOUNTER — Other Ambulatory Visit: Payer: Self-pay

## 2019-05-26 ENCOUNTER — Encounter: Payer: Self-pay | Admitting: Neurology

## 2019-05-26 ENCOUNTER — Ambulatory Visit: Payer: Medicare HMO | Admitting: Neurology

## 2019-05-26 VITALS — BP 150/65 | HR 50 | Temp 98.0°F | Ht 69.0 in | Wt 178.0 lb

## 2019-05-26 DIAGNOSIS — G959 Disease of spinal cord, unspecified: Secondary | ICD-10-CM

## 2019-05-26 DIAGNOSIS — M79641 Pain in right hand: Secondary | ICD-10-CM

## 2019-05-26 DIAGNOSIS — M62562 Muscle wasting and atrophy, not elsewhere classified, left lower leg: Secondary | ICD-10-CM | POA: Diagnosis not present

## 2019-05-26 DIAGNOSIS — R292 Abnormal reflex: Secondary | ICD-10-CM | POA: Diagnosis not present

## 2019-05-26 NOTE — Progress Notes (Signed)
[facility] Clinic Note - Initial Visit   Date: 05/26/19  Jose Patel MRN: [id number] DOB: Aug 03, 1933   Dear Abbey Chatters, NP:  Thank you for your kind referral of [name] for consultation of leg atrophy. Although his history is well known to you, please allow [place] to reiterate it for the purpose of our medical record. The patient was accompanied to the clinic by self.    History of Present Illness: Jose Patel is a 85 y.o. left-handed male with atrial fibrillation s/p cardioversion, aortic stenosis, CHF, right cerebellar stroke, and hypertension presenting for evaluation of left leg atrophy.   Starting in early summer of 2020, his daughter noticed that his left leg was smaller as compared to the right.  Over the past 6 months, he has become slower with walking and has fallen a few times.  He has started physical therapy, but this was temporarily stopped due to being hospitalized for atrial fibrillation.  He walks unassisted.  He denies any numbness/tingling or pain in the legs.  He complain of achy low back pain.  He denies leg or arm cramps.  He does have some neck pain and tingling in the right hand.   He lives with his son and sister-in-law.  He is the primary caregiver to his wife who has dementia. He drives and gets help with finances and medications.    Out-side paper records, electronic medical record, and images have been reviewed where available and summarized as:  Lab Results  Component Value Date   HGBA1C 5.9 (H) 04/18/2019   Lab Results  Component Value Date   VITAMINB12 >2,000 (H) 04/18/2019   Lab Results  Component Value Date   TSH 2.200 12/24/2018     Past Medical History:  Diagnosis Date  . A-fib (HCC)   . Anticoagulated on Coumadin    Per records from [facility]   . Aortic regurgitation   . Aortic stenosis   . CHF (congestive heart failure) (HCC)   . Coronary artery disease   . CVA (cerebral vascular accident)  (HCC)    hx of CVA noted on CT from 02/19/18  . Diabetes mellitus, type 2 (HCC)   . Gastro-esophageal reflux disease without esophagitis    Per records from previous provider, [facility]  . Heart attack (HCC)   . History of CT scan of head 02/19/2018   Per records from previous provider, [facility]. Chronic changes small vessel disease  . History of ECG    03/26/14- Sinus Tachycardia @ 107 bmp, QRS 78 msec, QT 298 msec, QTc 361 msec. Per records from [facility]  . History of Holter monitoring 11/08/2016   Per records from previous provider, [facility]  . Hypertension   . 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 MI (myocardial infarction) [facility])    Per records from [facility]   . Mitral regurgitation   . Mitral stenosis   . Other intervertebral disc degeneration, lumbar region    Per records from previous provider, [facility]  . Radiculopathy, lumbar region    Per records from previous provider, [facility]  . Sinus bradycardia    Per records from [facility]   . Sleep apnea   . Transient ischemic attack    10 to  12 years ago  . Typical atrial flutter (HCC)    Per records from [facility]     Past Surgical History:  Procedure Laterality Date  . CARDIAC ELECTROPHYSIOLOGY STUDY AND ABLATION    . CARDIOVERSION N/A 12/26/2018   Procedure: CARDIOVERSION;  Surgeon: Chrystie Nose, MD;  Location: [facility] ENDOSCOPY;  Service: Cardiovascular;  Laterality: N/A;  . CARDIOVERSION N/A 05/14/2019   Procedure: CARDIOVERSION;  Surgeon: Thurmon Fair, MD;  Location: [facility] ENDOSCOPY;  Service: Cardiovascular;  Laterality: N/A;  . CATARACT EXTRACTION    . Debridement to right arm with MRSA infection     4 years ago  . GALLBLADDER SURGERY      Per records from [facility]   . TEE WITHOUT CARDIOVERSION N/A 12/26/2018   Procedure: TRANSESOPHAGEAL ECHOCARDIOGRAM (TEE);  Surgeon: Chrystie Nose, MD;  Location[facility] ENDOSCOPY;  Service: Cardiovascular;  Laterality: N/A;     Medications:  Outpatient Encounter Medications as of 05/26/2019  Medication Sig Note  . amiodarone (PACERONE) 200 MG tablet Take 1 tablet (200 mg total) by mouth daily.   Marland Kitchen amLODipine (NORVASC) 10 MG tablet TAKE 1 TABLET BY MOUTH EVERY DAY (Patient taking differently: Take 10 mg by mouth daily. )   . amoxicillin (AMOXIL) 500 MG capsule Take 2,000 mg by mouth See admin instructions. Take 2,000 mg by mouth one hour prior to dental appointment   . Cholecalciferol (VITAMIN D3) 50 MCG (2000 UT) TABS Take 2,000 Units by mouth daily with breakfast.   . gabapentin (NEURONTIN) 100 MG capsule Take 1 capsule (100 mg total) by mouth daily as needed. (Patient taking differently: Take 100 mg by mouth daily as needed (for nerve pain). )   . hydrALAZINE (APRESOLINE) 25 MG tablet Take 1 tablet (25 mg total) by mouth 2 (two) times daily.   Marland Kitchen losartan (COZAAR) 50 MG tablet Take 2 tablets (100 mg total) by mouth daily.   . Melatonin 5 MG TABS Take 5 mg by mouth at bedtime as needed (for sleep).    . metoprolol tartrate (LOPRESSOR) 25 MG tablet Take 1 tablet (25 mg total) by mouth as needed (Once daily as needed for a heart rate over 100 bpm.). (Patient taking differently: Take 25 mg by mouth daily as needed (for a heart rate over 100 bpm). )   . Red Yeast Rice 600 MG CAPS Take 600 mg by mouth 2 (two) times a day.   . vitamin B-12 (CYANOCOBALAMIN) 1000 MCG tablet Take 1 tablet (1,000 mcg total) by mouth daily.   . Vitamin D, Ergocalciferol, (DRISDOL) 1.25 MG (50000 UT) CAPS capsule Take 1 capsule (50,000 Units total) by mouth every 7 (seven) days.   Carlena Hurl 20 MG TABS tablet TAKE 1 TABLET (20 MG TOTAL) BY MOUTH DAILY WITH SUPPER. (Patient taking differently: Take 20 mg by mouth daily. )    . memantine (NAMENDA TITRATION PAK) tablet pack 5 mg/day for =1 week; 5 mg twice daily for =1 week; 15 mg/day given in 5 mg and 10 mg separated doses for =1 week; then 10 mg twice daily (Patient taking differently: Take 5-10 mg by mouth See admin instructions. Take 5 mg by mouth once a day for 7 days, 5 mg two times a day for 7 days, 15 mg once a day (given in 5 mg & 10 mg separated "doses," and 10 mg two times a day thereafter) 05/26/2019: Hasn't started yet      No facility-administered encounter medications on file as of 05/26/2019.  Allergies:  Allergies  Allergen Reactions  . Aricept [Donepezil] Diarrhea, Nausea And Vomiting and Other (See Comments)    Cannot tolerate- bradycardia, also  . Tropicamide Nausea And Vomiting    Used to dilate eyes (might be this- was switched to something this was tolerable, after this)  . Adhesive [Tape] Rash    Family History: Family History  Problem Relation Age of Onset  . Heart failure Mother   . Hyperlipidemia Mother   . Hypertension Son   . Hyperlipidemia Son   . Arthritis Son   . Arthritis Daughter   . Hypertension Son     Social History: Social History   Tobacco Use  . Smoking status: Former Games developer  . Smokeless tobacco: Never Used  . Tobacco comment: Quit at age 61  Substance Use Topics  . Alcohol use: Never    Frequency: Never  . Drug use: Never   Social History   Social History Narrative   Social History      Diet?       Do you drink/eat things with caffeine? yes      Marital status?          married                          What year were you married? 1957      Do you live in a house, apartment, assisted living, condo, trailer, etc.? home      Is it one or more stories? 1      How many persons live in your home? 4      Do you have any pets in your home? (please list) yes- 3 dogs, 1 cat      Highest level of education completed? 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           no                           Type & how often?      Advanced Directives      Do you have a living will? yes      Do you have a DNR form?                                  If not, do you want to discuss one? no      Do you have signed POA/HPOA for forms? yes      Functional Status      Do you have difficulty bathing or dressing yourself? no      Do you have difficulty preparing food or eating? no      Do you have difficulty managing your medications? no      Do you have difficulty managing your finances? no      Do you have difficulty affording your medications? Yes xarelto    Review of Systems:  CONSTITUTIONAL: No fevers, chills, night sweats, or weight loss.   EYES: No visual changes or eye pain ENT: No hearing changes.  No history of nose bleeds.   RESPIRATORY: No cough, wheezing and shortness of breath.   CARDIOVASCULAR: Negative for chest pain, and palpitations.   GI: Negative for abdominal discomfort, blood in stools or  black stools.  No recent change in bowel habits.   GU:  No history of incontinence.   MUSCLOSKELETAL: No history of joint pain or swelling.  No myalgias.   SKIN: Negative for lesions, rash, and itching.   HEMATOLOGY/ONCOLOGY: Negative for prolonged bleeding, bruising easily, and swollen nodes.  ENDOCRINE: Negative for cold or heat intolerance, polydipsia or goiter.   PSYCH:  No depression or anxiety symptoms.   NEURO: As Above.   Vital Signs:  BP (!) 150/65   Pulse (!) 50   Temp 98 F (36.7 C)   Ht 5\' 9"  (1.753 m)   Wt 178 lb (80.7 kg)   SpO2 98%   BMI 26.29 kg/m    General Medical Exam:   General:  Well appearing, comfortable.   Eyes/ENT: see cranial nerve examination.   Neck:   No carotid bruits. Respiratory:  Mild rhonchi at the bases, good air entry bilaterally.   Cardiac:  Regular rate and rhythm, systolic murmur.   Extremities:  No deformities, edema, or skin discoloration.  Skin:  No rashes  or lesions.  Neurological Exam: MENTAL STATUS including orientation to time, place, person, recent and remote memory, attention span and concentration, language, and fund of knowledge is normal.  Speech is not dysarthric.  CRANIAL NERVES: II:  No visual field defects.   III-IV-VI: Pupils equal round and reactive to light.  Normal conjugate, extra-ocular eye movements in all directions of gaze.  No nystagmus.  No ptosis.   V:  Normal facial sensation.    VII:  Normal facial symmetry and movements.   VIII:  Normal hearing and vestibular function.   IX-X:  Normal palatal movement.   XI:  Normal shoulder shrug and head rotation.   XII:  Normal tongue strength and range of motion, no deviation or fasciculation.  MOTOR:  Moderate loss of muscle bulk in the lower extremities, worse on the left TA and medial gastroc.  No fasciculations or abnormal movements.  No pronator drift.   Upper Extremity:  Right  Left  Deltoid  5/5   5/5   Biceps  5/5   5/5   Triceps  5/5   5/5   Infraspinatus 5/5  5/5  Medial pectoralis 5/5  5/5  Wrist extensors  5/5   5/5   Wrist flexors  5/5   5/5   Finger extensors  5/5   5/5   Finger flexors  5/5   5/5   Dorsal interossei  5/5   5/5   Abductor pollicis  5/5   5/5   Tone (Ashworth scale)  0  0   Lower Extremity:  Right  Left  Hip flexors  5/5   5/5   Hip extensors  5/5   5/5   Adductor 5/5  5/5  Abductor 5/5  5/5  Knee flexors  5/5   5/5   Knee extensors  5/5   5/5   Dorsiflexors  5/5   5-/5   Plantarflexors  5/5   5-/5   Toe extensors  5/5   5/5   Toe flexors  5/5   5/5   Tone (Ashworth scale)  0  0   MSRs:  Right        Left                  brachioradialis 3+  3+  biceps 2+  2+  triceps 2+  2+  patellar 3+  3+  ankle jerk 2+  2+  Hoffman no  no  plantar response down  down   SENSORY:  Normal and symmetric perception of light touch, pinprick, vibration, and proprioception. Mild sway with Romberg's testing.   COORDINATION/GAIT: Normal  finger-to- nose-finger.  Intact rapid alternating movements bilaterally.  Able to rise from a chair without using arms.  Gait narrow based and stable. Mild unsteadiness with tandem and stressed gait but able to perform.   IMPRESSION: 1.  Bilateral leg atrophy, worse on the left lower leg.   2.  Right hand paresthesia 3.  Hyperreflexia  Despite loss of muscle bulk in the legs and trace weakness with dorsiflexion on the left, his motor strength remains full and intact.  There is brisk reflexes in the arms and legs suggestive of myelopathy.  To evaluate for compressive pathology, he will have MRI cervical and lumbar spine wo contrast.    Further recommendations pending results.   Thank you for allowing me to participate in patient's care.  If I can answer any additional questions, I would be pleased to do so.    Sincerely,    Danny Zimny K. Allena Katz, DO

## 2019-05-26 NOTE — Patient Instructions (Addendum)
MRI cervical and lumbar spine without spine  Continue physical therapy  We have sent a referral to [facility] for your MRI and they will call you directly to schedule your appointment. They are located at [place]. If you need to contact them directly please call [phone].

## 2019-05-27 DIAGNOSIS — E538 Deficiency of other specified B group vitamins: Secondary | ICD-10-CM | POA: Diagnosis not present

## 2019-05-27 DIAGNOSIS — I110 Hypertensive heart disease with heart failure: Secondary | ICD-10-CM | POA: Diagnosis not present

## 2019-05-27 DIAGNOSIS — M4126 Other idiopathic scoliosis, lumbar region: Secondary | ICD-10-CM | POA: Diagnosis not present

## 2019-05-27 DIAGNOSIS — R69 Illness, unspecified: Secondary | ICD-10-CM | POA: Diagnosis not present

## 2019-05-27 DIAGNOSIS — I5030 Unspecified diastolic (congestive) heart failure: Secondary | ICD-10-CM | POA: Diagnosis not present

## 2019-05-27 DIAGNOSIS — I480 Paroxysmal atrial fibrillation: Secondary | ICD-10-CM | POA: Diagnosis not present

## 2019-05-27 DIAGNOSIS — M5116 Intervertebral disc disorders with radiculopathy, lumbar region: Secondary | ICD-10-CM | POA: Diagnosis not present

## 2019-05-27 DIAGNOSIS — I483 Typical atrial flutter: Secondary | ICD-10-CM | POA: Diagnosis not present

## 2019-05-27 DIAGNOSIS — E1140 Type 2 diabetes mellitus with diabetic neuropathy, unspecified: Secondary | ICD-10-CM | POA: Diagnosis not present

## 2019-05-27 DIAGNOSIS — I2510 Atherosclerotic heart disease of native coronary artery without angina pectoris: Secondary | ICD-10-CM | POA: Diagnosis not present

## 2019-05-28 DIAGNOSIS — I110 Hypertensive heart disease with heart failure: Secondary | ICD-10-CM | POA: Diagnosis not present

## 2019-05-28 DIAGNOSIS — M5116 Intervertebral disc disorders with radiculopathy, lumbar region: Secondary | ICD-10-CM | POA: Diagnosis not present

## 2019-05-28 DIAGNOSIS — E1140 Type 2 diabetes mellitus with diabetic neuropathy, unspecified: Secondary | ICD-10-CM | POA: Diagnosis not present

## 2019-05-28 DIAGNOSIS — I480 Paroxysmal atrial fibrillation: Secondary | ICD-10-CM | POA: Diagnosis not present

## 2019-05-28 DIAGNOSIS — E538 Deficiency of other specified B group vitamins: Secondary | ICD-10-CM | POA: Diagnosis not present

## 2019-05-28 DIAGNOSIS — I483 Typical atrial flutter: Secondary | ICD-10-CM | POA: Diagnosis not present

## 2019-05-28 DIAGNOSIS — I2510 Atherosclerotic heart disease of native coronary artery without angina pectoris: Secondary | ICD-10-CM | POA: Diagnosis not present

## 2019-05-28 DIAGNOSIS — M4126 Other idiopathic scoliosis, lumbar region: Secondary | ICD-10-CM | POA: Diagnosis not present

## 2019-05-28 DIAGNOSIS — R69 Illness, unspecified: Secondary | ICD-10-CM | POA: Diagnosis not present

## 2019-05-28 DIAGNOSIS — I5030 Unspecified diastolic (congestive) heart failure: Secondary | ICD-10-CM | POA: Diagnosis not present

## 2019-05-28 NOTE — Progress Notes (Signed)
Cardiology Office Note   Date:  05/29/2019   ID:  Cristofher Weitkamp, DOB [birth year], MRN [id number]  PCP:  [name], NP  Cardiologist: Dr.Croitoru  No chief complaint on file.    History of Present Illness: Berklee Zachar is a 85 y.o. male who presents for posthospitalization follow-up with a history of paroxysmal atrial fibrillation on Xarelto, status post radiofrequency ablation, in 2016, diastolic heart failure, aortic insufficiency, CVA, hypertension, hyperlipidemia, GERD, OSA, and back pain.    The patient was recently admitted for recurrent atrial fibrillation on 05/13/2019.  The patient was cardioverted on 05/14/2019 with 120 J with resultant atrial flutter with 2-1 AV block with subsequent spontaneous conversion to sinus bradycardia.  He was started on dofetilide administration with QT monitoring.  On fourth dose of dofetilide his QT was noted to be lengthened at 570 ms,  and therefore this was discontinued.  On discharge he was started on amiodarone 200 mg a day after a 5-day washout of of dofetilide.  He was to begin amiodarone on Wednesday, May 21, 2019.  It was documented in his discharge summary that Dr. Royann Shivers stated that "It is highly likely he will require pacemaker implantation since he will develop symptomatic bradycardia. We have already reviewed this as well as the implantation procedure, risks/complications/benefits, with both Tom and his daughter,Tina".  He was continued on Xarelto.   Mr. Samule is seen today via telephone visit.  He offers no complaints of racing heart rate, palpitations, chest pain, dyspnea, or fatigue.  He is tolerating the Xarelto as he has been on this for years without complaints of bleeding or excessive bruising.  His daughter is on the phone with him.  She is concerned about him having other episodes of recurrent atrial fibrillation despite change in medicine.  She and her father have been talking about the pacemaker and are ready for him  to move forward with implantation as discussed with her by Dr. Royann Shivers during recent hospitalization.  Past Medical History:  Diagnosis Date  . A-fib (HCC)   . Anticoagulated on Coumadin    Per records from [facility]   . Aortic regurgitation   . Aortic stenosis   . CHF (congestive heart failure) (HCC)   . Coronary artery disease   . CVA (cerebral vascular accident) (HCC)    hx of CVA noted on CT from 02/19/18  . Diabetes mellitus, type 2 (HCC)   . Gastro-esophageal reflux disease without esophagitis    Per records from previous provider, [facility]  . Heart attack (HCC)   . History of CT scan of head 02/19/2018   Per records from previous provider, [facility]. Chronic changes small vessel disease  . History of ECG    03/26/14- Sinus Tachycardia @ 107 bmp, QRS 78 msec, QT 298 msec, QTc 361 msec. Per records from [facility]  . History of Holter monitoring 11/08/2016   Per records from previous provider, [facility]  . Hypertension   . 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 MI (myocardial infarction) [facility])    Per records from [facility]   . Mitral regurgitation   . Mitral stenosis   . Other intervertebral disc degeneration, lumbar region    Per records from previous provider, [facility]  . Radiculopathy, lumbar region  Per records from previous provider, [facility]  . Sinus bradycardia    Per records from [facility]   . Sleep apnea   . Transient ischemic attack    10 to 12 years ago  . Typical atrial flutter (HCC)    Per records from [facility]     Past Surgical History:  Procedure Laterality Date  . CARDIAC ELECTROPHYSIOLOGY STUDY AND ABLATION    . CARDIOVERSION N/A 12/26/2018   Procedure: CARDIOVERSION;   Surgeon: Chrystie Nose, MD;  Location: [facility] ENDOSCOPY;  Service: Cardiovascular;  Laterality: N/A;  . CARDIOVERSION N/A 05/14/2019   Procedure: CARDIOVERSION;  Surgeon: Thurmon Fair, MD;  Location: [facility] ENDOSCOPY;  Service: Cardiovascular;  Laterality: N/A;  . CATARACT EXTRACTION    . Debridement to right arm with MRSA infection     4 years ago  . GALLBLADDER SURGERY     Per records from [facility]   . TEE WITHOUT CARDIOVERSION N/A 12/26/2018   Procedure: TRANSESOPHAGEAL ECHOCARDIOGRAM (TEE);  Surgeon: Chrystie Nose, MD;  Location: [facility] ENDOSCOPY;  Service: Cardiovascular;  Laterality: N/A;     Current Outpatient Medications  Medication Sig Dispense Refill  . amiodarone (PACERONE) 200 MG tablet Take 1 tablet (200 mg total) by mouth daily. 30 tablet 6  . amLODipine (NORVASC) 10 MG tablet TAKE 1 TABLET BY MOUTH EVERY DAY (Patient taking differently: Take 10 mg by mouth daily. ) 90 tablet 1  . amoxicillin (AMOXIL) 500 MG capsule Take 2,000 mg by mouth See admin instructions. Take 2,000 mg by mouth one hour prior to dental appointment    . Cholecalciferol (VITAMIN D3) 50 MCG (2000 UT) TABS Take 2,000 Units by mouth daily with breakfast.    . gabapentin (NEURONTIN) 100 MG capsule Take 1 capsule (100 mg total) by mouth daily as needed. (Patient taking differently: Take 100 mg by mouth daily as needed (for nerve pain). ) 30 capsule 1  . hydrALAZINE (APRESOLINE) 25 MG tablet Take 1 tablet (25 mg total) by mouth 2 (two) times daily. 180 tablet 1  . losartan (COZAAR) 50 MG tablet Take 2 tablets (100 mg total) by mouth daily. 180 tablet 1  . Melatonin 5 MG TABS Take 5 mg by mouth at bedtime as needed (for sleep).     . metoprolol tartrate (LOPRESSOR) 25 MG tablet Take 1 tablet (25 mg total) by mouth as needed (Once daily as needed for a heart rate over 100 bpm.). (Patient taking differently: Take 25 mg by mouth daily as needed (for a heart rate over 100 bpm). ) 20 tablet 0  . Red Yeast Rice 600 MG  CAPS Take 600 mg by mouth 2 (two) times a day.    Carlena Hurl 20 MG TABS tablet TAKE 1 TABLET (20 MG TOTAL) BY MOUTH DAILY WITH SUPPER. (Patient taking differently: Take 20 mg by mouth daily. ) 30 tablet 5  . memantine (NAMENDA TITRATION PAK) tablet pack 5 mg/day for =1 week; 5 mg twice daily for =1 week; 15 mg/day given in 5 mg and 10 mg separated doses for =1 week; then 10 mg twice daily (Patient not taking: Reported on 05/29/2019) 49 tablet 12   No current facility-administered medications for this visit.     Allergies:   Aricept [donepezil], Tropicamide, and Adhesive [tape]    Social History:  The patient  reports that he has quit smoking. He has never used smokeless tobacco. He reports that he does not drink alcohol or use drugs.  Family History:  The patient's family history includes Arthritis in his daughter and son; Heart failure in his mother; Hyperlipidemia in his mother and son; Hypertension in his son and son.    ROS: All other systems are reviewed and negative. Unless otherwise mentioned in H&P    PHYSICAL EXAM: VS:  Pulse (!) 50   Ht 5\' 9"  (1.753 m)   Wt 174 lb (78.9 kg)   BMI 25.70 kg/m  , BMI Body mass index is 25.7 kg/m. GEN: Well nourished, well developed, in no acute distress Neuro:  Strength and sensation are intact Psych: euthymic mood, full affect   EKG: Not completed as this is a virtual visit  Recent Labs: 12/24/2018: TSH 2.200 05/13/2019: ALT 14; Hemoglobin 14.4; Platelets 239 05/16/2019: BUN 34; Creatinine, Ser 1.28; Magnesium 2.2; Potassium 4.3; Sodium 137    Lipid Panel    Component Value Date/Time   CHOL 115 11/11/2018 0939   TRIG 65 11/11/2018 0939   HDL 38 (L) 11/11/2018 0939   CHOLHDL 3.0 11/11/2018 0939   LDLCALC 63 11/11/2018 0939      Wt Readings from Last 3 Encounters:  05/29/19 174 lb (78.9 kg)  05/26/19 178 lb (80.7 kg)  05/14/19 166 lb 6.4 oz (75.5 kg)      Other studies Reviewed: Echocardiogram 28-Dec-2018  1. The left  ventricle has normal systolic function, with an ejection fraction of 60-65%. There is mildly increased left ventricular wall thickness. No evidence of left ventricular regional wall motion abnormalities.  2. The right ventricle has normal systolc function. The cavity was normal. There is no increase in right ventricular wall thickness.  3. Left atrial size was moderately dilated.  4. Right atrial size was mildly dilated.  5. The mitral valve is abnormal. Mild thickening of the mitral valve leaflet.  6. The tricuspid valve was grossly normal.  7. The aortic valve is tricuspid Mild sclerosis of the aortic valve. Aortic valve regurgitation is moderate by color flow Doppler.  8. There is moderate dilatation of the ascending aorta measuring 44 mm.   ASSESSMENT AND PLAN:  1.  Recurrent atrial fibrillation: Recent hospitalization on 05/13/2019, required cardioversion on 05/14/2019 was resulted in atrial flutter with 2-1 AV block and subsequent spontaneous conversion to sinus bradycardia.  Dofetilide was discontinued due to prolonged QT interval and amiodarone 200 mg was started on May 21, 2019.  The patient states that he feels great and has not had any problems with breathing, rapid heart rhythm, dizziness, or significant fatigue.  He and his daughter have decided to move forward with pacemaker implantation.  I will notify Dr. Royann Shivers of their decision.  He has a scheduled office visit on June 29, 2019 with Dr. Royann Shivers.  However, they would like to move forward with implantation before this.  I will await Dr. Erin Hearing  recommendation concerning timing of pacemaker implantation.  2.  Chronic diastolic CHF: He offers no complaints of lower extremity edema or PND.  He states he wants to go out and do more activities however his family is keeping him from being as active until he follows up with cardiology.  No change in his medication regimen at this time.  3.  Hypertension: His daughter states  that his blood pressure has been pretty stable.  Sometimes it goes up but normally settles back down.  Highest is been in the 150s systolic.  He is asymptomatic.  We will continue him on amlodipine 10 mg daily, losartan 50 mg daily, and hydralazine 25  mg twice daily.  Current medicines are reviewed at length with the patient today.    Labs/ tests ordered today include: None  Bettey Mare. Liborio Nixon, ANP, AACC   05/29/2019 10:03 AM    [facility] [address] Office [phone] Fax [phone]  Notice: This dictation was prepared with Dragon dictation along with smaller phrase technology. Any transcriptional errors that result from this process are unintentional and may not be corrected upon review.

## 2019-05-29 ENCOUNTER — Telehealth (INDEPENDENT_AMBULATORY_CARE_PROVIDER_SITE_OTHER): Payer: Medicare HMO | Admitting: Adult Health

## 2019-05-29 ENCOUNTER — Encounter: Payer: Self-pay | Admitting: Adult Health

## 2019-05-29 VITALS — HR 50 | Ht 69.0 in | Wt 174.0 lb

## 2019-05-29 DIAGNOSIS — I10 Essential (primary) hypertension: Secondary | ICD-10-CM | POA: Diagnosis not present

## 2019-05-29 DIAGNOSIS — I5032 Chronic diastolic (congestive) heart failure: Secondary | ICD-10-CM

## 2019-05-29 DIAGNOSIS — R001 Bradycardia, unspecified: Secondary | ICD-10-CM

## 2019-05-29 DIAGNOSIS — R69 Illness, unspecified: Secondary | ICD-10-CM | POA: Diagnosis not present

## 2019-05-29 DIAGNOSIS — I480 Paroxysmal atrial fibrillation: Secondary | ICD-10-CM

## 2019-05-29 NOTE — Patient Instructions (Signed)
Medication Instructions:  Continue current medications  *If you need a refill on your cardiac medications before your next appointment, please call your pharmacy*  Lab Work: None Ordered  Testing/Procedures: None Ordered  Follow-Up: At [facility], you and your health needs are our priority.  As part of our continuing mission to provide you with exceptional heart care, we have created designated Provider Care Teams.  These Care Teams include your primary Cardiologist (physician) and Advanced Practice Providers (APPs -  Physician Assistants and Nurse Practitioners) who all work together to provide you with the care you need, when you need it.  Your next appointment:   3 months  The format for your next appointment:   In Person  Provider:   [name], MD

## 2019-06-02 ENCOUNTER — Other Ambulatory Visit: Payer: Self-pay | Admitting: Nurse Practitioner

## 2019-06-02 DIAGNOSIS — I483 Typical atrial flutter: Secondary | ICD-10-CM | POA: Diagnosis not present

## 2019-06-02 DIAGNOSIS — M5116 Intervertebral disc disorders with radiculopathy, lumbar region: Secondary | ICD-10-CM | POA: Diagnosis not present

## 2019-06-02 DIAGNOSIS — F0390 Unspecified dementia without behavioral disturbance: Secondary | ICD-10-CM

## 2019-06-02 DIAGNOSIS — M4126 Other idiopathic scoliosis, lumbar region: Secondary | ICD-10-CM | POA: Diagnosis not present

## 2019-06-02 DIAGNOSIS — I480 Paroxysmal atrial fibrillation: Secondary | ICD-10-CM | POA: Diagnosis not present

## 2019-06-02 DIAGNOSIS — E538 Deficiency of other specified B group vitamins: Secondary | ICD-10-CM | POA: Diagnosis not present

## 2019-06-02 DIAGNOSIS — R69 Illness, unspecified: Secondary | ICD-10-CM | POA: Diagnosis not present

## 2019-06-02 DIAGNOSIS — E1140 Type 2 diabetes mellitus with diabetic neuropathy, unspecified: Secondary | ICD-10-CM | POA: Diagnosis not present

## 2019-06-02 DIAGNOSIS — I110 Hypertensive heart disease with heart failure: Secondary | ICD-10-CM | POA: Diagnosis not present

## 2019-06-02 DIAGNOSIS — I2510 Atherosclerotic heart disease of native coronary artery without angina pectoris: Secondary | ICD-10-CM | POA: Diagnosis not present

## 2019-06-02 DIAGNOSIS — I5030 Unspecified diastolic (congestive) heart failure: Secondary | ICD-10-CM | POA: Diagnosis not present

## 2019-06-04 DIAGNOSIS — I480 Paroxysmal atrial fibrillation: Secondary | ICD-10-CM | POA: Diagnosis not present

## 2019-06-04 DIAGNOSIS — I110 Hypertensive heart disease with heart failure: Secondary | ICD-10-CM | POA: Diagnosis not present

## 2019-06-04 DIAGNOSIS — R69 Illness, unspecified: Secondary | ICD-10-CM | POA: Diagnosis not present

## 2019-06-04 DIAGNOSIS — E1140 Type 2 diabetes mellitus with diabetic neuropathy, unspecified: Secondary | ICD-10-CM | POA: Diagnosis not present

## 2019-06-04 DIAGNOSIS — M5116 Intervertebral disc disorders with radiculopathy, lumbar region: Secondary | ICD-10-CM | POA: Diagnosis not present

## 2019-06-04 DIAGNOSIS — I2510 Atherosclerotic heart disease of native coronary artery without angina pectoris: Secondary | ICD-10-CM | POA: Diagnosis not present

## 2019-06-04 DIAGNOSIS — M4126 Other idiopathic scoliosis, lumbar region: Secondary | ICD-10-CM | POA: Diagnosis not present

## 2019-06-04 DIAGNOSIS — I5030 Unspecified diastolic (congestive) heart failure: Secondary | ICD-10-CM | POA: Diagnosis not present

## 2019-06-04 DIAGNOSIS — E538 Deficiency of other specified B group vitamins: Secondary | ICD-10-CM | POA: Diagnosis not present

## 2019-06-04 DIAGNOSIS — I483 Typical atrial flutter: Secondary | ICD-10-CM | POA: Diagnosis not present

## 2019-06-05 ENCOUNTER — Encounter: Payer: Self-pay | Admitting: Nurse Practitioner

## 2019-06-06 ENCOUNTER — Encounter: Payer: Self-pay | Admitting: Nurse Practitioner

## 2019-06-06 ENCOUNTER — Ambulatory Visit (INDEPENDENT_AMBULATORY_CARE_PROVIDER_SITE_OTHER): Payer: Medicare HMO | Admitting: Nurse Practitioner

## 2019-06-06 ENCOUNTER — Other Ambulatory Visit: Payer: Self-pay

## 2019-06-06 ENCOUNTER — Ambulatory Visit: Payer: Medicare HMO | Admitting: Nurse Practitioner

## 2019-06-06 DIAGNOSIS — I10 Essential (primary) hypertension: Secondary | ICD-10-CM | POA: Diagnosis not present

## 2019-06-06 DIAGNOSIS — G4733 Obstructive sleep apnea (adult) (pediatric): Secondary | ICD-10-CM | POA: Diagnosis not present

## 2019-06-06 DIAGNOSIS — Z9989 Dependence on other enabling machines and devices: Secondary | ICD-10-CM

## 2019-06-06 MED ORDER — HYDRALAZINE HCL 50 MG PO TABS: 50.0000 mg | ORAL_TABLET | Freq: Two times a day (BID) | ORAL | 1 refills | Status: DC

## 2019-06-06 NOTE — Patient Instructions (Addendum)
Increase hydralazine to 50 mg by mouth twice daily Continue to monitor blood pressure  Goal bp <140/90   DASH Eating Plan DASH stands for "Dietary Approaches to Stop Hypertension." The DASH eating plan is a healthy eating plan that has been shown to reduce high blood pressure (hypertension). It may also reduce your risk for type 2 diabetes, heart disease, and stroke. The DASH eating plan may also help with weight loss. What are tips for following this plan?  General guidelines  Avoid eating more than 2,300 mg (milligrams) of salt (sodium) a day. If you have hypertension, you may need to reduce your sodium intake to 1,500 mg a day.  Limit alcohol intake to no more than 1 drink a day for nonpregnant women and 2 drinks a day for men. One drink equals 12 oz of beer, 5 oz of wine, or 1 oz of hard liquor.  Work with your health care provider to maintain a healthy body weight or to lose weight. Ask what an ideal weight is for you.  Get at least 30 minutes of exercise that causes your heart to beat faster (aerobic exercise) most days of the week. Activities may include walking, swimming, or biking.  Work with your health care provider or diet and nutrition specialist (dietitian) to adjust your eating plan to your individual calorie needs. Reading food labels   Check food labels for the amount of sodium per serving. Choose foods with less than 5 percent of the Daily Value of sodium. Generally, foods with less than 300 mg of sodium per serving fit into this eating plan.  To find whole grains, look for the word "whole" as the first word in the ingredient list. Shopping  Buy products labeled as "low-sodium" or "no salt added."  Buy fresh foods. Avoid canned foods and premade or frozen meals. Cooking  Avoid adding salt when cooking. Use salt-free seasonings or herbs instead of table salt or sea salt. Check with your health care provider or pharmacist before using salt substitutes.  Do not fry  foods. Cook foods using healthy methods such as baking, boiling, grilling, and broiling instead.  Cook with heart-healthy oils, such as olive, canola, soybean, or sunflower oil. Meal planning  Eat a balanced diet that includes: ? 5 or more servings of fruits and vegetables each day. At each meal, try to fill half of your plate with fruits and vegetables. ? Up to 6-8 servings of whole grains each day. ? Less than 6 oz of lean meat, poultry, or fish each day. A 3-oz serving of meat is about the same size as a deck of cards. One egg equals 1 oz. ? 2 servings of low-fat dairy each day. ? A serving of nuts, seeds, or beans 5 times each week. ? Heart-healthy fats. Healthy fats called Omega-3 fatty acids are found in foods such as flaxseeds and coldwater fish, like sardines, salmon, and mackerel.  Limit how much you eat of the following: ? Canned or prepackaged foods. ? Food that is high in trans fat, such as fried foods. ? Food that is high in saturated fat, such as fatty meat. ? Sweets, desserts, sugary drinks, and other foods with added sugar. ? Full-fat dairy products.  Do not salt foods before eating.  Try to eat at least 2 vegetarian meals each week.  Eat more home-cooked food and less restaurant, buffet, and fast food.  When eating at a restaurant, ask that your food be prepared with less salt or no salt,  if possible. What foods are recommended? The items listed may not be a complete list. Talk with your dietitian about what dietary choices are best for you. Grains Whole-grain or whole-wheat bread. Whole-grain or whole-wheat pasta. Brown rice. [place] Morrow. Bulgur. Whole-grain and low-sodium cereals. Pita bread. Low-fat, low-sodium crackers. Whole-wheat flour tortillas. Vegetables Fresh or frozen vegetables (raw, steamed, roasted, or grilled). Low-sodium or reduced-sodium tomato and vegetable juice. Low-sodium or reduced-sodium tomato sauce and tomato paste. Low-sodium or  reduced-sodium canned vegetables. Fruits All fresh, dried, or frozen fruit. Canned fruit in natural juice (without added sugar). Meat and other protein foods Skinless chicken or [place]. Ground chicken or [place]. Pork with fat trimmed off. Fish and seafood. Egg whites. Dried beans, peas, or lentils. Unsalted nuts, nut butters, and seeds. Unsalted canned beans. Lean cuts of beef with fat trimmed off. Low-sodium, lean deli meat. Dairy Low-fat (1%) or fat-free (skim) milk. Fat-free, low-fat, or reduced-fat cheeses. Nonfat, low-sodium ricotta or cottage cheese. Low-fat or nonfat yogurt. Low-fat, low-sodium cheese. Fats and oils Soft margarine without trans fats. Vegetable oil. Low-fat, reduced-fat, or light mayonnaise and salad dressings (reduced-sodium). Canola, safflower, olive, soybean, and sunflower oils. Avocado. Seasoning and other foods Herbs. Spices. Seasoning mixes without salt. Unsalted popcorn and pretzels. Fat-free sweets. What foods are not recommended? The items listed may not be a complete list. Talk with your dietitian about what dietary choices are best for you. Grains Baked goods made with fat, such as croissants, muffins, or some breads. Dry pasta or rice meal packs. Vegetables Creamed or fried vegetables. Vegetables in a cheese sauce. Regular canned vegetables (not low-sodium or reduced-sodium). Regular canned tomato sauce and paste (not low-sodium or reduced-sodium). Regular tomato and vegetable juice (not low-sodium or reduced-sodium). Angie Fava. Olives. Fruits Canned fruit in a light or heavy syrup. Fried fruit. Fruit in cream or butter sauce. Meat and other protein foods Fatty cuts of meat. Ribs. Fried meat. Berniece Salines. Sausage. Bologna and other processed lunch meats. Salami. Fatback. Hotdogs. Bratwurst. Salted nuts and seeds. Canned beans with added salt. Canned or smoked fish. Whole eggs or egg yolks. Chicken or [place] with skin. Dairy Whole or 2% milk, cream, and half-and-half.  Whole or full-fat cream cheese. Whole-fat or sweetened yogurt. Full-fat cheese. Nondairy creamers. Whipped toppings. Processed cheese and cheese spreads. Fats and oils Butter. Stick margarine. Lard. Shortening. Ghee. Bacon fat. Tropical oils, such as coconut, palm kernel, or palm oil. Seasoning and other foods Salted popcorn and pretzels. Onion salt, garlic salt, seasoned salt, table salt, and sea salt. Worcestershire sauce. Tartar sauce. Barbecue sauce. Teriyaki sauce. Soy sauce, including reduced-sodium. Steak sauce. Canned and packaged gravies. Fish sauce. Oyster sauce. Cocktail sauce. Horseradish that you find on the shelf. Ketchup. Mustard. Meat flavorings and tenderizers. Bouillon cubes. Hot sauce and Tabasco sauce. Premade or packaged marinades. Premade or packaged taco seasonings. Relishes. Regular salad dressings. Where to find more information:  National Heart, Lung, and [place]: https://wilson-eaton.com/  American Heart Association: www.heart.org Summary  The DASH eating plan is a healthy eating plan that has been shown to reduce high blood pressure (hypertension). It may also reduce your risk for type 2 diabetes, heart disease, and stroke.  With the DASH eating plan, you should limit salt (sodium) intake to 2,300 mg a day. If you have hypertension, you may need to reduce your sodium intake to 1,500 mg a day.  When on the DASH eating plan, aim to eat more fresh fruits and vegetables, whole grains, lean proteins, low-fat dairy, and heart-healthy fats.  Work with your health care provider or diet and nutrition specialist (dietitian) to adjust your eating plan to your individual calorie needs. This information is not intended to replace advice given to you by your health care provider. Make sure you discuss any questions you have with your health care provider. Document Released: 07/06/2011 Document Revised: 06/29/2017 Document Reviewed: 07/10/2016 Elsevier Patient Education  2020  [employer].

## 2019-06-06 NOTE — Progress Notes (Signed)
This service is provided via telemedicine  No vital signs collected/recorded due to the encounter was a telemedicine visit.   Location of patient (ex: home, work):  Home  Patient consents to a telephone visit:  Yes  Location of the provider (ex: office, home):  Office  Name of any referring provider:  N/A  Names of all persons participating in the telemedicine service and their role in the encounter: Marisa Cyphers RMA, Sherrie Mustache NP, Alba Destine Patient  Time spent on call:  10 min.     Careteam: Patient Care Team: [name], NP as PCP - General (Geriatric Medicine) [name], MD as PCP - Cardiology (Cardiology) Center, [facility] (Gastroenterology) [name], MD as Consulting Physician  Advanced Directive information Does Patient Have a Medical Advance Directive?: Yes  Allergies  Allergen Reactions  . Aricept [Donepezil] Diarrhea, Nausea And Vomiting and Other (See Comments)    Cannot tolerate- bradycardia, also  . Tropicamide Nausea And Vomiting    Used to dilate eyes (might be this- was switched to something this was tolerable, after this)  . Adhesive [Tape] Rash    Chief Complaint  Patient presents with  . Acute Visit    Blood Pressure Medication     HPI: Patient is a 85 y.o. male for televisit due to medication adherence. Daughter is on the phone to help  Has been forgetful in the past but generally does take his blood pressure medication.  He is on hydralazine 25 mg TID and forgets the mid-day dose most the time. Blood pressure today was 169/69- 1 hour after medication. Blood pressure remaining high. sbp 170-180. Lots of stress due to wife with dementia.    Daughter mentions he needs updated supplies for CPAP- has been a long time since it was evaluated   Review of Systems:  Review of Systems  Constitutional: Negative for chills and fever.  Eyes: Negative for blurred vision and double vision.  Respiratory: Negative for  shortness of breath.   Cardiovascular: Negative for chest pain and palpitations.  Neurological: Negative for dizziness and headaches.    Past Medical History:  Diagnosis Date  . A-fib ([place])   . Anticoagulated on Coumadin    Per records from [place]   . Aortic regurgitation   . Aortic stenosis   . CHF (congestive heart failure) ([place])   . Coronary artery disease   . CVA (cerebral vascular accident) ([place])    hx of CVA noted on CT from 02/19/18  . Diabetes mellitus, type 2 ([place])   . Gastro-esophageal reflux disease without esophagitis    Per records from previous provider, [facility]  . Heart attack ([place])   . History of CT scan of head 02/19/2018   Per records from previous provider, [facility]. Chronic changes small vessel disease  . History of ECG    03/26/14- Sinus Tachycardia @ 107 bmp, QRS 78 msec, QT 298 msec, QTc 361 msec. Per records from [place]  . History of Holter monitoring 11/08/2016   Per records from previous provider, [facility]  . Hypertension   . 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 MI (myocardial infarction) [facility])    Per records from [place]   . Mitral regurgitation   . Mitral stenosis   . Other intervertebral disc degeneration, lumbar region  Per records from previous provider, [facility] and [facility]  . Radiculopathy, lumbar region    Per records from previous provider, [facility]  . Sinus bradycardia    Per records from [place]   . Sleep apnea   . Transient ischemic attack    10 to 12 years ago  . Typical atrial flutter ([place])    Per records from [place]    Past Surgical History:  Procedure Laterality Date  . CARDIAC ELECTROPHYSIOLOGY STUDY AND ABLATION    . CARDIOVERSION N/A  12/26/2018   Procedure: CARDIOVERSION;  Surgeon: Pixie Casino, MD;  Location: [facility] ENDOSCOPY;  Service: Cardiovascular;  Laterality: N/A;  . CARDIOVERSION N/A 05/14/2019   Procedure: CARDIOVERSION;  Surgeon: [name], MD;  Location: [facility] ENDOSCOPY;  Service: Cardiovascular;  Laterality: N/A;  . CATARACT EXTRACTION    . Debridement to right arm with MRSA infection     4 years ago  . GALLBLADDER SURGERY     Per records from [place]   . TEE WITHOUT CARDIOVERSION N/A 12/26/2018   Procedure: TRANSESOPHAGEAL ECHOCARDIOGRAM (TEE);  Surgeon: Pixie Casino, MD;  Location: [facility] ENDOSCOPY;  Service: Cardiovascular;  Laterality: N/A;   Social History:   reports that he has quit smoking. He has never used smokeless tobacco. He reports that he does not drink alcohol or use drugs.  Family History  Problem Relation Age of Onset  . Heart failure Mother   . Hyperlipidemia Mother   . Hypertension Son   . Hyperlipidemia Son   . Arthritis Son   . Arthritis Daughter   . Hypertension Son     Medications: Patient's Medications  New Prescriptions   No medications on file  Previous Medications   AMIODARONE (PACERONE) 200 MG TABLET    Take 1 tablet (200 mg total) by mouth daily.   AMLODIPINE (NORVASC) 10 MG TABLET    TAKE 1 TABLET BY MOUTH EVERY DAY   AMOXICILLIN (AMOXIL) 500 MG CAPSULE    Take 2,000 mg by mouth See admin instructions. Take 2,000 mg by mouth one hour prior to dental appointment   CHOLECALCIFEROL (VITAMIN D3) 50 MCG (2000 UT) TABS    Take 2,000 Units by mouth daily with breakfast.   GABAPENTIN (NEURONTIN) 100 MG CAPSULE    Take 1 capsule (100 mg total) by mouth daily as needed.   HYDRALAZINE (APRESOLINE) 25 MG TABLET    Take 1 tablet (25 mg total) by mouth 2 (two) times daily.   LOSARTAN (COZAAR) 50 MG TABLET    Take 2 tablets (100 mg total) by mouth daily.   MELATONIN 5 MG TABS    Take 5 mg by mouth at bedtime as needed (for sleep).    MEMANTINE (NAMENDA) 10 MG TABLET    Take  1 tablet (10 mg total) by mouth 2 (two) times daily.   METOPROLOL TARTRATE (LOPRESSOR) 25 MG TABLET    Take 1 tablet (25 mg total) by mouth as needed (Once daily as needed for a heart rate over 100 bpm.).   RED YEAST RICE 600 MG CAPS    Take 600 mg by mouth 2 (two) times a day.   XARELTO 20 MG TABS TABLET    TAKE 1 TABLET (20 MG TOTAL) BY MOUTH DAILY WITH SUPPER.  Modified Medications   No medications on file  Discontinued Medications   No medications on file    Physical Exam:  There were no vitals filed for this visit. There  is no height or weight on file to calculate BMI. Wt Readings from Last 3 Encounters:  05/29/19 174 lb (78.9 kg)  05/26/19 178 lb (80.7 kg)  05/14/19 166 lb 6.4 oz (75.5 kg)     Labs reviewed: Basic Metabolic Panel: Recent Labs    08/12/18 1228  12/24/18 1509  05/14/19 1722 05/15/19 0801 05/16/19 0218  NA  --    < > 140   < > 140 139 137  K  --    < > 4.9   < > 4.1 3.8 4.3  CL  --    < > 102   < > 102 105 109  CO2  --    < > 22   < > 26 26 22   GLUCOSE  --    < > 112*   < > 166* 118* 100*  BUN  --    < > 26   < > 23 36* 34*  CREATININE  --    < > 1.12   < > 1.27* 1.43* 1.28*  CALCIUM  --    < > 9.5   < > 8.9 8.8* 7.8*  MG  --   --   --    < > 2.0 2.5* 2.2  TSH 1.88  --  2.200  --   --   --   --    < > = values in this interval not displayed.   Liver Function Tests: Recent Labs    08/07/18 1015 03/13/19 1320 05/13/19 1503  AST 16 21 15   ALT 22 26 14   ALKPHOS  --  81 77  BILITOT 0.5 0.7 1.0  PROT 6.9 7.0 7.0  ALBUMIN  --  3.8 3.6   No results for input(s): LIPASE, AMYLASE in the last 8760 hours. No results for input(s): AMMONIA in the last 8760 hours. CBC: Recent Labs    08/07/18 1015 12/20/18 1304 12/24/18 1509 03/13/19 1320 05/13/19 1503  WBC 6.9 7.6 7.2 7.5 6.7  NEUTROABS 3,919 5.2  --  4.8  --   HGB 14.7 15.4 14.2 14.4 14.4  HCT 43.2 46.7 42.6 44.5 44.9  MCV 92.3 93.2 93 94.1 97.6  PLT 224 228 211 230 239   Lipid  Panel: Recent Labs    08/07/18 1015 11/11/18 0939  CHOL 150 115  HDL 35* 38*  LDLCALC 96 63  TRIG 94 65  CHOLHDL 4.3 3.0   TSH: Recent Labs    08/12/18 1228 12/24/18 1509  TSH 1.88 2.200   A1C: Lab Results  Component Value Date   HGBA1C 5.9 (H) 04/18/2019     Assessment/Plan 1. OSA on CPAP -needing updated supplies, has been a long time since CPAP was evaluated - Ambulatory referral to Pulmonology  2. Essential hypertension Blood pressure remaining high. Reports overall compliance with losartan, amlodipine and hydralazine. Occasionally will forget a dose.  -blood pressures have been remaining elevated. Today sbp 169-180 after medication and sitting -will increase hydralazine to 50 mg BID and have family to continue to monitor.  - hydrALAZINE (APRESOLINE) 50 MG tablet; Take 1 tablet (50 mg total) by mouth 2 (two) times daily.  Dispense: 60 tablet; Refill: 1  Next appt: 08/14/2019 as scheduled, sooner if needed  Merline Perkin K. Harle Battiest  [facility] [phone]     Virtual Visit via Telephone Note  I connected with@ on 06/06/19 at  1:00 PM EST by telephone and verified that I am speaking with the correct person  using two identifiers.  Location: Patient: home Provider: office   I discussed the limitations, risks, security and privacy concerns of performing an evaluation and management service by telephone and the availability of in person appointments. I also discussed with the patient that there may be a patient responsible charge related to this service. The patient expressed understanding and agreed to proceed.   I discussed the assessment and treatment plan with the patient. The patient was provided an opportunity to ask questions and all were answered. The patient agreed with the plan and demonstrated an understanding of the instructions.   The patient was advised to call back or seek an in-person evaluation if the symptoms worsen  or if the condition fails to improve as anticipated.  I provided 14 minutes of non-face-to-face time during this encounter.  Carlos American. Harle Battiest Avs printed and mailed

## 2019-06-09 DIAGNOSIS — M4126 Other idiopathic scoliosis, lumbar region: Secondary | ICD-10-CM | POA: Diagnosis not present

## 2019-06-09 DIAGNOSIS — M5116 Intervertebral disc disorders with radiculopathy, lumbar region: Secondary | ICD-10-CM | POA: Diagnosis not present

## 2019-06-09 DIAGNOSIS — I5030 Unspecified diastolic (congestive) heart failure: Secondary | ICD-10-CM | POA: Diagnosis not present

## 2019-06-09 DIAGNOSIS — E1140 Type 2 diabetes mellitus with diabetic neuropathy, unspecified: Secondary | ICD-10-CM | POA: Diagnosis not present

## 2019-06-09 DIAGNOSIS — I2510 Atherosclerotic heart disease of native coronary artery without angina pectoris: Secondary | ICD-10-CM | POA: Diagnosis not present

## 2019-06-09 DIAGNOSIS — I483 Typical atrial flutter: Secondary | ICD-10-CM | POA: Diagnosis not present

## 2019-06-09 DIAGNOSIS — R69 Illness, unspecified: Secondary | ICD-10-CM | POA: Diagnosis not present

## 2019-06-09 DIAGNOSIS — E538 Deficiency of other specified B group vitamins: Secondary | ICD-10-CM | POA: Diagnosis not present

## 2019-06-09 DIAGNOSIS — I110 Hypertensive heart disease with heart failure: Secondary | ICD-10-CM | POA: Diagnosis not present

## 2019-06-09 DIAGNOSIS — I480 Paroxysmal atrial fibrillation: Secondary | ICD-10-CM | POA: Diagnosis not present

## 2019-06-11 DIAGNOSIS — I483 Typical atrial flutter: Secondary | ICD-10-CM | POA: Diagnosis not present

## 2019-06-11 DIAGNOSIS — E538 Deficiency of other specified B group vitamins: Secondary | ICD-10-CM | POA: Diagnosis not present

## 2019-06-11 DIAGNOSIS — I5030 Unspecified diastolic (congestive) heart failure: Secondary | ICD-10-CM | POA: Diagnosis not present

## 2019-06-11 DIAGNOSIS — I2510 Atherosclerotic heart disease of native coronary artery without angina pectoris: Secondary | ICD-10-CM | POA: Diagnosis not present

## 2019-06-11 DIAGNOSIS — E1140 Type 2 diabetes mellitus with diabetic neuropathy, unspecified: Secondary | ICD-10-CM | POA: Diagnosis not present

## 2019-06-11 DIAGNOSIS — I480 Paroxysmal atrial fibrillation: Secondary | ICD-10-CM | POA: Diagnosis not present

## 2019-06-11 DIAGNOSIS — I110 Hypertensive heart disease with heart failure: Secondary | ICD-10-CM | POA: Diagnosis not present

## 2019-06-11 DIAGNOSIS — M4126 Other idiopathic scoliosis, lumbar region: Secondary | ICD-10-CM | POA: Diagnosis not present

## 2019-06-11 DIAGNOSIS — M5116 Intervertebral disc disorders with radiculopathy, lumbar region: Secondary | ICD-10-CM | POA: Diagnosis not present

## 2019-06-11 DIAGNOSIS — R69 Illness, unspecified: Secondary | ICD-10-CM | POA: Diagnosis not present

## 2019-06-12 ENCOUNTER — Telehealth: Payer: Self-pay | Admitting: *Deleted

## 2019-06-12 NOTE — Telephone Encounter (Signed)
Medication Adhearance-Losartan.  Called and spoke with Patient's Caregiver, [name]. She stated that patient is taking Losartan. Confirmed dosage and directions and no refill is needed at this time.

## 2019-06-16 DIAGNOSIS — E538 Deficiency of other specified B group vitamins: Secondary | ICD-10-CM | POA: Diagnosis not present

## 2019-06-16 DIAGNOSIS — I483 Typical atrial flutter: Secondary | ICD-10-CM | POA: Diagnosis not present

## 2019-06-16 DIAGNOSIS — R69 Illness, unspecified: Secondary | ICD-10-CM | POA: Diagnosis not present

## 2019-06-16 DIAGNOSIS — M4126 Other idiopathic scoliosis, lumbar region: Secondary | ICD-10-CM | POA: Diagnosis not present

## 2019-06-16 DIAGNOSIS — I5030 Unspecified diastolic (congestive) heart failure: Secondary | ICD-10-CM | POA: Diagnosis not present

## 2019-06-16 DIAGNOSIS — E1140 Type 2 diabetes mellitus with diabetic neuropathy, unspecified: Secondary | ICD-10-CM | POA: Diagnosis not present

## 2019-06-16 DIAGNOSIS — I2510 Atherosclerotic heart disease of native coronary artery without angina pectoris: Secondary | ICD-10-CM | POA: Diagnosis not present

## 2019-06-16 DIAGNOSIS — I480 Paroxysmal atrial fibrillation: Secondary | ICD-10-CM | POA: Diagnosis not present

## 2019-06-16 DIAGNOSIS — M5116 Intervertebral disc disorders with radiculopathy, lumbar region: Secondary | ICD-10-CM | POA: Diagnosis not present

## 2019-06-16 DIAGNOSIS — I110 Hypertensive heart disease with heart failure: Secondary | ICD-10-CM | POA: Diagnosis not present

## 2019-06-17 ENCOUNTER — Other Ambulatory Visit: Payer: Self-pay | Admitting: Nurse Practitioner

## 2019-06-17 DIAGNOSIS — F0390 Unspecified dementia without behavioral disturbance: Secondary | ICD-10-CM

## 2019-06-18 ENCOUNTER — Ambulatory Visit
Admission: RE | Admit: 2019-06-18 | Discharge: 2019-06-18 | Disposition: A | Discharge status: Home / Self Care | Payer: Medicare HMO | Source: Ambulatory Visit | Attending: Neurology | Admitting: Neurology

## 2019-06-18 ENCOUNTER — Other Ambulatory Visit: Payer: Self-pay

## 2019-06-18 DIAGNOSIS — R292 Abnormal reflex: Secondary | ICD-10-CM

## 2019-06-18 DIAGNOSIS — M62562 Muscle wasting and atrophy, not elsewhere classified, left lower leg: Secondary | ICD-10-CM

## 2019-06-18 DIAGNOSIS — G959 Disease of spinal cord, unspecified: Secondary | ICD-10-CM

## 2019-06-18 DIAGNOSIS — M4802 Spinal stenosis, cervical region: Secondary | ICD-10-CM | POA: Diagnosis not present

## 2019-06-18 DIAGNOSIS — M48061 Spinal stenosis, lumbar region without neurogenic claudication: Secondary | ICD-10-CM | POA: Diagnosis not present

## 2019-06-18 DIAGNOSIS — M79641 Pain in right hand: Secondary | ICD-10-CM

## 2019-06-19 DIAGNOSIS — I5030 Unspecified diastolic (congestive) heart failure: Secondary | ICD-10-CM | POA: Diagnosis not present

## 2019-06-19 DIAGNOSIS — I483 Typical atrial flutter: Secondary | ICD-10-CM | POA: Diagnosis not present

## 2019-06-19 DIAGNOSIS — M4126 Other idiopathic scoliosis, lumbar region: Secondary | ICD-10-CM | POA: Diagnosis not present

## 2019-06-19 DIAGNOSIS — I110 Hypertensive heart disease with heart failure: Secondary | ICD-10-CM | POA: Diagnosis not present

## 2019-06-19 DIAGNOSIS — E538 Deficiency of other specified B group vitamins: Secondary | ICD-10-CM | POA: Diagnosis not present

## 2019-06-19 DIAGNOSIS — M5116 Intervertebral disc disorders with radiculopathy, lumbar region: Secondary | ICD-10-CM | POA: Diagnosis not present

## 2019-06-19 DIAGNOSIS — E1140 Type 2 diabetes mellitus with diabetic neuropathy, unspecified: Secondary | ICD-10-CM | POA: Diagnosis not present

## 2019-06-19 DIAGNOSIS — I480 Paroxysmal atrial fibrillation: Secondary | ICD-10-CM | POA: Diagnosis not present

## 2019-06-19 DIAGNOSIS — R69 Illness, unspecified: Secondary | ICD-10-CM | POA: Diagnosis not present

## 2019-06-19 DIAGNOSIS — I2510 Atherosclerotic heart disease of native coronary artery without angina pectoris: Secondary | ICD-10-CM | POA: Diagnosis not present

## 2019-06-20 ENCOUNTER — Other Ambulatory Visit: Payer: Self-pay

## 2019-06-20 ENCOUNTER — Encounter: Payer: Self-pay | Admitting: Cardiovascular Disease

## 2019-06-20 ENCOUNTER — Ambulatory Visit: Payer: Medicare HMO | Admitting: Cardiovascular Disease

## 2019-06-20 VITALS — BP 187/75 | HR 48 | Temp 97.5°F | Ht 69.0 in | Wt 177.8 lb

## 2019-06-20 DIAGNOSIS — Z8679 Personal history of other diseases of the circulatory system: Secondary | ICD-10-CM

## 2019-06-20 DIAGNOSIS — I495 Sick sinus syndrome: Secondary | ICD-10-CM | POA: Diagnosis not present

## 2019-06-20 DIAGNOSIS — G4733 Obstructive sleep apnea (adult) (pediatric): Secondary | ICD-10-CM

## 2019-06-20 DIAGNOSIS — I712 Thoracic aortic aneurysm, without rupture: Secondary | ICD-10-CM

## 2019-06-20 DIAGNOSIS — I5189 Other ill-defined heart diseases: Secondary | ICD-10-CM

## 2019-06-20 DIAGNOSIS — Z7901 Long term (current) use of anticoagulants: Secondary | ICD-10-CM

## 2019-06-20 DIAGNOSIS — I519 Heart disease, unspecified: Secondary | ICD-10-CM | POA: Diagnosis not present

## 2019-06-20 DIAGNOSIS — I7121 Aneurysm of the ascending aorta, without rupture: Secondary | ICD-10-CM

## 2019-06-20 DIAGNOSIS — I352 Nonrheumatic aortic (valve) stenosis with insufficiency: Secondary | ICD-10-CM | POA: Diagnosis not present

## 2019-06-20 DIAGNOSIS — I10 Essential (primary) hypertension: Secondary | ICD-10-CM

## 2019-06-20 DIAGNOSIS — E7800 Pure hypercholesterolemia, unspecified: Secondary | ICD-10-CM

## 2019-06-20 DIAGNOSIS — I480 Paroxysmal atrial fibrillation: Secondary | ICD-10-CM

## 2019-06-20 NOTE — H&P (View-Only) (Signed)
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 swelling over the incision.  A slight bump over the skin where the device was placed. Sometimes, it is possible to feel the device under the skin. This is normal.  Follow these instructions at home: Medicines  Take over-the-counter and prescription medicines only as told by your health care provider.  If you were prescribed an antibiotic medicine, take it as told by your health care provider. Do not stop taking the antibiotic even if you start to feel better. Wound care  Do not remove the bandage on your chest until directed to do so by your health care provider.  After your bandage is removed, you may see pieces of tape called skin adhesive strips  over the area where the cut was made (incision site). Let them fall off on their own.  Check the incision site every day to make sure it is not infected, bleeding, or starting to pull apart.  Do not use lotions or ointments near the incision site unless directed to do so.  Keep the incision area clean and dry for 2-3 days after the procedure or as directed by your health care provider. It takes several weeks for the incision site to completely  heal.  Do not take baths, swim, or use a hot tub for 7-10 days or as otherwise directed by your health care provider. Activity  Do not drive or use heavy machinery while taking prescription pain medicine.  Do not drive for 24 hours if you were given a medicine to help you relax (sedative).  Check with your health care provider before you start to drive or play sports.  Avoid sudden jerking, pulling, or chopping movements that pull your upper arm far away from your body. Avoid these movements for at least 6 weeks or as long as told by your health care provider.  Do not lift your upper arm above your shoulders for at least 6 weeks or as long as told by your health care provider. This means no tennis, golf, or swimming.  You may go back to work when your health care provider says it is okay. Pacemaker care  You may be shown how to transfer data from your pacemaker through the phone to your health care provider.  Always let all health care providers know about your pacemaker before you have any medical procedures or tests.  Wear a medical ID bracelet or necklace stating that you have a pacemaker. Carry a pacemaker ID card with you at all times.  Your pacemaker battery will last for 5-15 years. Routine checks by your health care provider will let the health care provider know when the battery is starting to run down. The pacemaker will need to be replaced when the battery starts to run down.  Do not use amateur Civil engineer, contracting. Other electrical devices are safe to use, including power tools, lawn mowers, and speakers. If you are unsure of whether something is safe to use, ask your health care provider.  When using your cell phone, hold it to the ear opposite the pacemaker. Do not leave your cell phone in a pocket over the pacemaker.  Avoid places or objects that have a strong electric or magnetic field, including: ? [employer]. When at the airport, let officials know that you have a pacemaker. ? Power plants. ? Large electrical generators. ? Radiofrequency transmission towers, such as cell phone and radio towers. General instructions  Weigh yourself every day. If you suddenly gain weight, fluid may be building up in your body.  Keep all follow-up visits as told by your health care provider. This is important. Contact a health care provider if:  You gain weight suddenly.  Your legs or feet swell.  It feels like your heart is fluttering or skipping beats (heart palpitations).  You have chills or a fever.  You have more redness, swelling, or pain around your incisions.  You have more fluid or blood coming from your incisions.  Your incisions feel warm to the touch.  You have pus or a bad smell coming from your incisions. Get help right away if:  You have chest pain.  You have trouble breathing or are short of breath.  You become extremely tired.  You are light-headed or you faint. This information is not intended to replace advice given to you by your health care provider. Make sure you discuss any questions you have with your health care provider. Document Released: 02/03/2005 Document Revised: 04/28/2016 Document Reviewed: 04/28/2016 Elsevier Interactive Patient Education  2018 [employer].    Supplemental Discharge Instructions for  Pacemaker/Defibrillator Patients  ACTIVITY No heavy lifting or vigorous activity with your left/right arm for 6 to 8 weeks.  Do not raise  your left/right arm above your head for one week.  Gradually raise your affected arm as drawn below.           __  NO DRIVING for     ; you may begin driving on     .  WOUND CARE - Keep the wound area clean and dry.  Do not get this area wet for one week. No showers for one week; you may shower on     . - The tape/steri-strips on your wound will fall off; do not pull them off.  No bandage is needed on the site.  DO  NOT apply any creams, oils, or ointments to the wound area. - If you notice any drainage or discharge from the wound, any swelling or bruising at the site, or you develop a fever > 101? F after you are discharged home, call the office at once.  SPECIAL INSTRUCTIONS - You are still able to use cellular telephones; use the ear opposite the side where you have your pacemaker/defibrillator.  Avoid carrying your cellular phone near your device. - When traveling through airports, show security personnel your identification card to avoid being screened in the metal detectors.  Ask the security personnel to use the hand wand. - Avoid arc welding equipment, MRI testing (magnetic resonance imaging), TENS units (transcutaneous nerve stimulators).  Call the office for questions about other devices. - Avoid electrical appliances that are in poor condition or are not properly grounded. - Microwave ovens are safe to be near or to operate.  ADDITIONAL INFORMATION FOR DEFIBRILLATOR PATIENTS SHOULD YOUR DEVICE GO OFF: - If your device goes off ONCE and you feel fine afterward, notify the device clinic nurses. - If your device goes off ONCE and you do not feel well afterward, call 911. - If your device goes off TWICE, call 911. - If your device goes off THREE TIMES IN ONE DAY, call 911.  DO NOT DRIVE YOURSELF OR A FAMILY MEMBER WITH A DEFIBRILLATOR TO THE HOSPITAL--CALL 911.    [facility] - Preparing For Surgery  Before surgery, you can play an important role. Because skin is not sterile,  your skin needs to be as free of germs as possible. You can reduce the number of germs on your skin by washing with CHG (chlorahexidine gluconate) Soap before surgery.  CHG is an antiseptic cleaner which kills germs and bonds with the skin to continue killing germs even after washing.   Please do not use if you have an allergy to CHG or antibacterial soaps.  If your skin becomes reddened/irritated stop using the CHG.   Do not shave (including legs and underarms) for at least 48 hours prior to first CHG shower.  It is OK to shave your face.  Please follow these instructions carefully:  1.  Shower the night before surgery and the morning of surgery with CHG.  2.  If you choose to wash your hair, wash your hair first as usual with your normal shampoo.  3.  After you shampoo, rinse your hair and body thoroughly to remove the shampoo.  4.  Use CHG as you would any other liquid soap.  You can apply CHG directly to the skin and wash gently with a clean washcloth. 5.  Apply the CHG Soap to your body ONLY FROM THE NECK DOWN.  Do not use on open wounds or open sores.  Avoid contact with your eyes, ears, mouth and genitals (private parts).  Wash genitals (private parts) with your normal soap.  6.  Wash thoroughly, paying special attention to the area where your surgery will be performed.  7.  Thoroughly rise your body with warm water from the neck down.   8.  DO NOT shower/wash with your normal soap after using and rinsing off the CHG soap.  9.  Pat yourself dry with a clean towel.           10.  Wear clean pajamas.           11.  Place clean sheets on your bed the night of your first shower and do not sleep with pets.  Day of Surgery: Do not apply any deodorants/lotions.  Please wear clean clothes to the hospital/surgery center.                     Signed, Thurmon Fair, MD  06/21/2019 2:22 PM    [facility]

## 2019-06-20 NOTE — Progress Notes (Signed)
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 swelling over the incision.  A slight bump over the skin where the device was placed. Sometimes, it is possible to feel the device under the skin. This is normal.  Follow these instructions at home: Medicines  Take over-the-counter and prescription medicines only as told by your health care provider.  If you were prescribed an antibiotic medicine, take it as told by your health care provider. Do not stop taking the antibiotic even if you start to feel better. Wound care  Do not remove the bandage on your chest until directed to do so by your health care provider.  After your bandage is removed, you may see pieces of tape called skin adhesive strips  over the area where the cut was made (incision site). Let them fall off on their own.  Check the incision site every day to make sure it is not infected, bleeding, or starting to pull apart.  Do not use lotions or ointments near the incision site unless directed to do so.  Keep the incision area clean and dry for 2-3 days after the procedure or as directed by your health care provider. It takes several weeks for the incision site to completely  heal.  Do not take baths, swim, or use a hot tub for 7-10 days or as otherwise directed by your health care provider. Activity  Do not drive or use heavy machinery while taking prescription pain medicine.  Do not drive for 24 hours if you were given a medicine to help you relax (sedative).  Check with your health care provider before you start to drive or play sports.  Avoid sudden jerking, pulling, or chopping movements that pull your upper arm far away from your body. Avoid these movements for at least 6 weeks or as long as told by your health care provider.  Do not lift your upper arm above your shoulders for at least 6 weeks or as long as told by your health care provider. This means no tennis, golf, or swimming.  You may go back to work when your health care provider says it is okay. Pacemaker care  You may be shown how to transfer data from your pacemaker through the phone to your health care provider.  Always let all health care providers know about your pacemaker before you have any medical procedures or tests.  Wear a medical ID bracelet or necklace stating that you have a pacemaker. Carry a pacemaker ID card with you at all times.  Your pacemaker battery will last for 5-15 years. Routine checks by your health care provider will let the health care provider know when the battery is starting to run down. The pacemaker will need to be replaced when the battery starts to run down.  Do not use amateur Civil engineer, contracting. Other electrical devices are safe to use, including power tools, lawn mowers, and speakers. If you are unsure of whether something is safe to use, ask your health care provider.  When using your cell phone, hold it to the ear opposite the pacemaker. Do not leave your cell phone in a pocket over the pacemaker.  Avoid places or objects that have a strong electric or magnetic field, including: ? [employer]. When at the airport, let officials know that you have a pacemaker. ? Power plants. ? Large electrical generators. ? Radiofrequency transmission towers, such as cell phone and radio towers. General instructions  Weigh yourself every day. If you suddenly gain weight, fluid may be building up in your body.  Keep all follow-up visits as told by your health care provider. This is important. Contact a health care provider if:  You gain weight suddenly.  Your legs or feet swell.  It feels like your heart is fluttering or skipping beats (heart palpitations).  You have chills or a fever.  You have more redness, swelling, or pain around your incisions.  You have more fluid or blood coming from your incisions.  Your incisions feel warm to the touch.  You have pus or a bad smell coming from your incisions. Get help right away if:  You have chest pain.  You have trouble breathing or are short of breath.  You become extremely tired.  You are light-headed or you faint. This information is not intended to replace advice given to you by your health care provider. Make sure you discuss any questions you have with your health care provider. Document Released: 02/03/2005 Document Revised: 04/28/2016 Document Reviewed: 04/28/2016 Elsevier Interactive Patient Education  2018 [employer].    Supplemental Discharge Instructions for  Pacemaker/Defibrillator Patients  ACTIVITY No heavy lifting or vigorous activity with your left/right arm for 6 to 8 weeks.  Do not raise  your left/right arm above your head for one week.  Gradually raise your affected arm as drawn below.           __  NO DRIVING for     ; you may begin driving on     .  WOUND CARE - Keep the wound area clean and dry.  Do not get this area wet for one week. No showers for one week; you may shower on     . - The tape/steri-strips on your wound will fall off; do not pull them off.  No bandage is needed on the site.  DO  NOT apply any creams, oils, or ointments to the wound area. - If you notice any drainage or discharge from the wound, any swelling or bruising at the site, or you develop a fever > 101? F after you are discharged home, call the office at once.  SPECIAL INSTRUCTIONS - You are still able to use cellular telephones; use the ear opposite the side where you have your pacemaker/defibrillator.  Avoid carrying your cellular phone near your device. - When traveling through airports, show security personnel your identification card to avoid being screened in the metal detectors.  Ask the security personnel to use the hand wand. - Avoid arc welding equipment, MRI testing (magnetic resonance imaging), TENS units (transcutaneous nerve stimulators).  Call the office for questions about other devices. - Avoid electrical appliances that are in poor condition or are not properly grounded. - Microwave ovens are safe to be near or to operate.  ADDITIONAL INFORMATION FOR DEFIBRILLATOR PATIENTS SHOULD YOUR DEVICE GO OFF: - If your device goes off ONCE and you feel fine afterward, notify the device clinic nurses. - If your device goes off ONCE and you do not feel well afterward, call 911. - If your device goes off TWICE, call 911. - If your device goes off THREE TIMES IN ONE DAY, call 911.  DO NOT DRIVE YOURSELF OR A FAMILY MEMBER WITH A DEFIBRILLATOR TO THE HOSPITAL--CALL 911.    [facility] - Preparing For Surgery  Before surgery, you can play an important role. Because skin is not sterile,  your skin needs to be as free of germs as possible. You can reduce the number of germs on your skin by washing with CHG (chlorahexidine gluconate) Soap before surgery.  CHG is an antiseptic cleaner which kills germs and bonds with the skin to continue killing germs even after washing.   Please do not use if you have an allergy to CHG or antibacterial soaps.  If your skin becomes reddened/irritated stop using the CHG.   Do not shave (including legs and underarms) for at least 48 hours prior to first CHG shower.  It is OK to shave your face.  Please follow these instructions carefully:  1.  Shower the night before surgery and the morning of surgery with CHG.  2.  If you choose to wash your hair, wash your hair first as usual with your normal shampoo.  3.  After you shampoo, rinse your hair and body thoroughly to remove the shampoo.  4.  Use CHG as you would any other liquid soap.  You can apply CHG directly to the skin and wash gently with a clean washcloth. 5.  Apply the CHG Soap to your body ONLY FROM THE NECK DOWN.  Do not use on open wounds or open sores.  Avoid contact with your eyes, ears, mouth and genitals (private parts).  Wash genitals (private parts) with your normal soap.  6.  Wash thoroughly, paying special attention to the area where your surgery will be performed.  7.  Thoroughly rise your body with warm water from the neck down.   8.  DO NOT shower/wash with your normal soap after using and rinsing off the CHG soap.  9.  Pat yourself dry with a clean towel.           10.  Wear clean pajamas.           11.  Place clean sheets on your bed the night of your first shower and do not sleep with pets.  Day of Surgery: Do not apply any deodorants/lotions.  Please wear clean clothes to the hospital/surgery center.                     Signed, Thurmon Fair, MD  06/21/2019 2:22 PM    [facility]

## 2019-06-20 NOTE — Patient Instructions (Addendum)
Medications: Your physician recommends that you continue on your current medications as directed. Please refer to the Current Medication list given to you today.     * If you need a refill on your cardiac medications before your next appointment, please call your pharmacy. *  Labwork: Pre procedure lab work 07/04/19 BMET & CBC w/ diff  You will need to have the coronavirus test completed prior to your procedure. An appointment has been made on 07/04/19 at 2:55PM . This is a Drive Up Visit at the [facility] [address] Please tell that you are there for pre procedure testing. Someone will direct you to the appropriate testing line. Stay in your car and someone will be with you shortly. Please make sure to have all other labs completed before this test because you will need to stay quarantined until your procedure.   * Will notify you of abnormal results, otherwise continue current treatment plan.*  Testing/Procedures: Your physician has recommended that you have a pacemaker inserted. A pacemaker is a small device that is placed under the skin of your chest or abdomen to help control abnormal heart rhythms. This device uses electrical pulses to prompt the heart to beat at a normal rate. Pacemakers are used to treat heart rhythms that are too slow. Wire (leads) are attached to the pacemaker that goes into the chambers of you heart. This is done in the hospital and usually requires and overnight stay. Please follow the instructions below, located under the special instructions section.  Follow-Up: Your physician recommends that you schedule a wound check appointment 10-14 days, after your procedure 07/07/2019 with the device clinic.   Your physician recommends that you schedule a follow up appointment in 91 days, after your procedure on 12/7/202 with Dr. Sallyanne Kuster.  Thank you for choosing [facility]!!      Any Other Special Instructions Will Be Listed Below (If  Applicable).     Implantable Device Instructions  You are scheduled for:                   Permanent Transvenous Pacemaker  On 07/07/19 with Dr. Lorenza Cambridge.  1.   Please arrive at the [facility], Entrance "A"  at [facility] at  11:30AM  on the day of your procedure. (The address is [address]  2. Do not eat or drink after midnight the night before your procedure.  3.   A) - Do NOT take Xarelto for 2 days prior to your procedure            4.  Plan for an overnight stay.  Bring your insurance cards and a list of you medications.  5.  Wash your chest and neck with surgical scrub the evening before and the morning of your procedure.  Rinse well. Please review the surgical scrub instruction sheet given to you. You may pick this up at the [facility] office or any drug store.  6. Your chest will need to be shaved prior to this procedure (if needed). We ask that you do this yourself at home 1 to 2 days before or if uncomfortable/unable to do yourself, then it will be performed by the hospital staff the day of.                                                                                                                *  If you have any questions after you get home, please call Lattie Haw, RN at [phone]  * Every attempt is made to prevent procedures from being rescheduled.  Due to the nature of  Electrophysiology, rescheduling can happen.  The physician is always aware and directs the staff when this occurs.     Pacemaker Implantation, Adult Pacemaker implantation is a procedure to place a pacemaker inside your chest. A pacemaker is a small computer that sends electrical signals to the heart and helps your heart beat normally. A pacemaker also stores information about your heart rhythms. You may need pacemaker implantation if you:  Have a slow heartbeat (bradycardia).  Faint (syncope).  Have shortness of breath (dyspnea) due to heart problems.  The pacemaker  attaches to your heart through a wire, called a lead. Sometimes just one lead is needed. Other times, there will be two leads. There are two types of pacemakers:  Transvenous pacemaker. This type is placed under the skin or muscle of your chest. The lead goes through a vein in the chest area to reach the inside of the heart.  Epicardial pacemaker. This type is placed under the skin or muscle of your chest or belly. The lead goes through your chest to the outside of the heart.  Tell a health care provider about:  Any allergies you have.  All medicines you are taking, including vitamins, herbs, eye drops, creams, and over-the-counter medicines.  Any problems you or family members have had with anesthetic medicines.  Any blood or bone disorders you have.  Any surgeries you have had.  Any medical conditions you have.  Whether you are pregnant or may be pregnant. What are the risks? Generally, this is a safe procedure. However, problems may occur, including:  Infection.  Bleeding.  Failure of the pacemaker or the lead.  Collapse of a lung or bleeding into a lung.  Blood clot inside a blood vessel with a lead.  Damage to the heart.  Infection inside the heart (endocarditis).  Allergic reactions to medicines.  What happens before the procedure? Staying hydrated Follow instructions from your health care provider about hydration, which may include:  Up to 2 hours before the procedure - you may continue to drink clear liquids, such as water, clear fruit juice, black coffee, and plain tea.  Eating and drinking restrictions Follow instructions from your health care provider about eating and drinking, which may include:  8 hours before the procedure - stop eating heavy meals or foods such as meat, fried foods, or fatty foods.  6 hours before the procedure - stop eating light meals or foods, such as toast or cereal.  6 hours before the procedure - stop drinking milk or drinks  that contain milk.  2 hours before the procedure - stop drinking clear liquids.  Medicines  Ask your health care provider about: ? Changing or stopping your regular medicines. This is especially important if you are taking diabetes medicines or blood thinners. ? Taking medicines such as aspirin and ibuprofen. These medicines can thin your blood. Do not take these medicines before your procedure if your health care provider instructs you not to.  You may be given antibiotic medicine to help prevent infection. General instructions  You will have a heart evaluation. This may include an electrocardiogram (ECG), chest X-ray, and heart imaging (echocardiogram,  or echo) tests.  You will have blood tests.  Do not use any products that contain nicotine or tobacco, such as cigarettes and e-cigarettes. If  you need help quitting, ask your health care provider.  Plan to have someone take you home from the hospital or clinic.  If you will be going home right after the procedure, plan to have someone with you for 24 hours.  Ask your health care provider how your surgical site will be marked or identified. What happens during the procedure?  To reduce your risk of infection: ? Your health care team will wash or sanitize their hands. ? Your skin will be washed with soap. ? Hair may be removed from the surgical area.  An IV tube will be inserted into one of your veins.  You will be given one or more of the following: ? A medicine to help you relax (sedative). ? A medicine to numb the area (local anesthetic). ? A medicine to make you fall asleep (general anesthetic).  If you are getting a transvenous pacemaker: ? An incision will be made in your upper chest. ? A pocket will be made for the pacemaker. It may be placed under the skin or between layers of muscle. ? The lead will be inserted into a blood vessel that returns to the heart. ? While X-rays are taken by an imaging machine  (fluoroscopy), the lead will be advanced through the vein to the inside of your heart. ? The other end of the lead will be tunneled under the skin and attached to the pacemaker.  If you are getting an epicardial pacemaker: ? An incision will be made near your ribs or breastbone (sternum) for the lead. ? The lead will be attached to the outside of your heart. ? Another incision will be made in your chest or upper belly to create a pocket for the pacemaker. ? The free end of the lead will be tunneled under the skin and attached to the pacemaker.  The transvenous or epicardial pacemaker will be tested. Imaging studies may be done to check the lead position.  The incisions will be closed with stitches (sutures), adhesive strips, or skin glue.  Bandages (dressing) will be placed over the incisions. The procedure may vary among health care providers and hospitals. What happens after the procedure?  Your blood pressure, heart rate, breathing rate, and blood oxygen level will be monitored until the medicines you were given have worn off.  You will be given antibiotics and pain medicine.  ECG and chest x-rays will be done.  You will wear a continuous type of ECG (Holter monitor) to check your heart rhythm.  Your health care provider will program the pacemaker.  Do not drive for 24 hours if you received a sedative. This information is not intended to replace advice given to you by your health care provider. Make sure you discuss any questions you have with your health care provider. Document Released: 07/07/2002 Document Revised: 02/04/2016 Document Reviewed: 12/29/2015 Elsevier Interactive Patient Education  2018 [employer].     Pacemaker Implantation, Adult, Care After This sheet gives you information about how to care for yourself after your procedure. Your health care provider may also give you more specific instructions. If you have problems or questions, contact your health care  provider. What can I expect after the procedure? After the procedure, it is common to have:  Mild pain.  Slight bruising.  Some swelling over the incision.  A slight bump over the skin where the device was placed. Sometimes, it is possible to feel the device under the skin. This is normal.  Follow these instructions  at home: Medicines  Take over-the-counter and prescription medicines only as told by your health care provider.  If you were prescribed an antibiotic medicine, take it as told by your health care provider. Do not stop taking the antibiotic even if you start to feel better. Wound care  Do not remove the bandage on your chest until directed to do so by your health care provider.  After your bandage is removed, you may see pieces of tape called skin adhesive strips over the area where the cut was made (incision site). Let them fall off on their own.  Check the incision site every day to make sure it is not infected, bleeding, or starting to pull apart.  Do not use lotions or ointments near the incision site unless directed to do so.  Keep the incision area clean and dry for 2-3 days after the procedure or as directed by your health care provider. It takes several weeks for the incision site to completely heal.  Do not take baths, swim, or use a hot tub for 7-10 days or as otherwise directed by your health care provider. Activity  Do not drive or use heavy machinery while taking prescription pain medicine.  Do not drive for 24 hours if you were given a medicine to help you relax (sedative).  Check with your health care provider before you start to drive or play sports.  Avoid sudden jerking, pulling, or chopping movements that pull your upper arm far away from your body. Avoid these movements for at least 6 weeks or as long as told by your health care provider.  Do not lift your upper arm above your shoulders for at least 6 weeks or as long as told by your health care  provider. This means no tennis, golf, or swimming.  You may go back to work when your health care provider says it is okay. Pacemaker care  You may be shown how to transfer data from your pacemaker through the phone to your health care provider.  Always let all health care providers know about your pacemaker before you have any medical procedures or tests.  Wear a medical ID bracelet or necklace stating that you have a pacemaker. Carry a pacemaker ID card with you at all times.  Your pacemaker battery will last for 5-15 years. Routine checks by your health care provider will let the health care provider know when the battery is starting to run down. The pacemaker will need to be replaced when the battery starts to run down.  Do not use amateur Chief of Staff. Other electrical devices are safe to use, including power tools, lawn mowers, and speakers. If you are unsure of whether something is safe to use, ask your health care provider.  When using your cell phone, hold it to the ear opposite the pacemaker. Do not leave your cell phone in a pocket over the pacemaker.  Avoid places or objects that have a strong electric or magnetic field, including: ? Airport Herbalist. When at the airport, let officials know that you have a pacemaker. ? Power plants. ? Large electrical generators. ? Radiofrequency transmission towers, such as cell phone and radio towers. General instructions  Weigh yourself every day. If you suddenly gain weight, fluid may be building up in your body.  Keep all follow-up visits as told by your health care provider. This is important. Contact a health care provider if:  You gain weight suddenly.  Your legs or  feet swell.  It feels like your heart is fluttering or skipping beats (heart palpitations).  You have chills or a fever.  You have more redness, swelling, or pain around your incisions.  You have more fluid or blood coming  from your incisions.  Your incisions feel warm to the touch.  You have pus or a bad smell coming from your incisions. Get help right away if:  You have chest pain.  You have trouble breathing or are short of breath.  You become extremely tired.  You are light-headed or you faint. This information is not intended to replace advice given to you by your health care provider. Make sure you discuss any questions you have with your health care provider. Document Released: 02/03/2005 Document Revised: 04/28/2016 Document Reviewed: 04/28/2016 Elsevier Interactive Patient Education  2018 [employer] Discharge Instructions for  Pacemaker/Defibrillator Patients  ACTIVITY No heavy lifting or vigorous activity with your left/right arm for 6 to 8 weeks.  Do not raise your left/right arm above your head for one week.  Gradually raise your affected arm as drawn below.           __  NO DRIVING for     ; you may begin driving on     .  WOUND CARE - Keep the wound area clean and dry.  Do not get this area wet for one week. No showers for one week; you may shower on     . - The tape/steri-strips on your wound will fall off; do not pull them off.  No bandage is needed on the site.  DO  NOT apply any creams, oils, or ointments to the wound area. - If you notice any drainage or discharge from the wound, any swelling or bruising at the site, or you develop a fever > 101? F after you are discharged home, call the office at once.  SPECIAL INSTRUCTIONS - You are still able to use cellular telephones; use the ear opposite the side where you have your pacemaker/defibrillator.  Avoid carrying your cellular phone near your device. - When traveling through airports, show security personnel your identification card to avoid being screened in the metal detectors.  Ask the security personnel to use the hand wand. - Avoid arc welding equipment, MRI testing (magnetic resonance imaging), TENS  units (transcutaneous nerve stimulators).  Call the office for questions about other devices. - Avoid electrical appliances that are in poor condition or are not properly grounded. - Microwave ovens are safe to be near or to operate.  ADDITIONAL INFORMATION FOR DEFIBRILLATOR PATIENTS SHOULD YOUR DEVICE GO OFF: - If your device goes off ONCE and you feel fine afterward, notify the device clinic nurses. - If your device goes off ONCE and you do not feel well afterward, call 911. - If your device goes off TWICE, call 911. - If your device goes off Catharine, call 911.  DO NOT DRIVE YOURSELF OR A FAMILY MEMBER WITH A DEFIBRILLATOR TO THE HOSPITAL-CALL 911.    [place] - Preparing For Surgery  Before surgery, you can play an important role. Because skin is not sterile, your skin needs to be as free of germs as possible. You can reduce the number of germs on your skin by washing with CHG (chlorahexidine gluconate) Soap before surgery.  CHG is an antiseptic cleaner which kills germs and bonds with the skin to continue killing germs even after washing.   Please do  not use if you have an allergy to CHG or antibacterial soaps.  If your skin becomes reddened/irritated stop using the CHG.   Do not shave (including legs and underarms) for at least 48 hours prior to first CHG shower.  It is OK to shave your face.  Please follow these instructions carefully:  1.  Shower the night before surgery and the morning of surgery with CHG.  2.  If you choose to wash your hair, wash your hair first as usual with your normal shampoo.  3.  After you shampoo, rinse your hair and body thoroughly to remove the shampoo.  4.  Use CHG as you would any other liquid soap.  You can apply CHG directly to the skin and wash gently with a clean washcloth. 5.  Apply the CHG Soap to your body ONLY FROM THE NECK DOWN.  Do not use on open wounds or open sores.  Avoid contact with your eyes, ears, mouth and genitals  (private parts).  Wash genitals (private parts) with your normal soap.  6.  Wash thoroughly, paying special attention to the area where your surgery will be performed.  7.  Thoroughly rise your body with warm water from the neck down.   8.  DO NOT shower/wash with your normal soap after using and rinsing off the CHG soap.  9.  Pat yourself dry with a clean towel.           10.  Wear clean pajamas.           11.  Place clean sheets on your bed the night of your first shower and do not sleep with pets.  Day of Surgery: Do not apply any deodorants/lotions.  Please wear clean clothes to the hospital/surgery center.

## 2019-06-25 ENCOUNTER — Telehealth: Payer: Self-pay | Admitting: Neurology

## 2019-06-25 NOTE — Telephone Encounter (Signed)
With patient's daughter the results of his MRI cervical and lumbar spine.  There is degenerative changes in both regions, however no evidence of compressive pathology which would explain his leg weakness and atrophy.  I discussed electrodiagnostic testing of both legs to further evaluate symptoms.  Daughter will call the office to schedule this as he is scheduled to have pacemaker implanted later this month and would like to get through this first.

## 2019-07-04 ENCOUNTER — Other Ambulatory Visit: Payer: Self-pay

## 2019-07-04 ENCOUNTER — Other Ambulatory Visit (HOSPITAL_COMMUNITY)
Admission: RE | Admit: 2019-07-04 | Discharge: 2019-07-04 | Disposition: A | Discharge status: Home / Self Care | Payer: Medicare HMO | Source: Ambulatory Visit | Attending: Cardiovascular Disease | Admitting: Cardiovascular Disease

## 2019-07-04 ENCOUNTER — Other Ambulatory Visit: Payer: Medicare HMO | Admitting: *Deleted

## 2019-07-04 DIAGNOSIS — I5189 Other ill-defined heart diseases: Secondary | ICD-10-CM

## 2019-07-04 DIAGNOSIS — I480 Paroxysmal atrial fibrillation: Secondary | ICD-10-CM

## 2019-07-04 DIAGNOSIS — I519 Heart disease, unspecified: Secondary | ICD-10-CM

## 2019-07-04 DIAGNOSIS — Z01812 Encounter for preprocedural laboratory examination: Secondary | ICD-10-CM | POA: Diagnosis not present

## 2019-07-04 DIAGNOSIS — I495 Sick sinus syndrome: Secondary | ICD-10-CM | POA: Diagnosis not present

## 2019-07-04 DIAGNOSIS — Z20828 Contact with and (suspected) exposure to other viral communicable diseases: Secondary | ICD-10-CM | POA: Insufficient documentation

## 2019-07-04 LAB — SARS CORONAVIRUS 2 (TAT 6-24 HRS): SARS Coronavirus 2: NEGATIVE

## 2019-07-05 ENCOUNTER — Other Ambulatory Visit: Payer: Self-pay | Admitting: *Deleted

## 2019-07-05 DIAGNOSIS — I495 Sick sinus syndrome: Secondary | ICD-10-CM

## 2019-07-05 LAB — BASIC METABOLIC PANEL
BUN/Creatinine Ratio: 20 (ref 10–24)
BUN: 24 mg/dL (ref 8–27)
CO2: 23 mmol/L (ref 20–29)
Calcium: 8.4 mg/dL — ABNORMAL LOW (ref 8.6–10.2)
Chloride: 105 mmol/L (ref 96–106)
Creatinine, Ser: 1.19 mg/dL (ref 0.76–1.27)
GFR calc Af Amer: 65 mL/min/{1.73_m2} (ref >59)
GFR calc non Af Amer: 56 mL/min/{1.73_m2} — ABNORMAL LOW (ref >59)
Glucose: 98 mg/dL (ref 65–99)
Potassium: 4.7 mmol/L (ref 3.5–5.2)
Sodium: 142 mmol/L (ref 134–144)

## 2019-07-05 LAB — CBC
Hematocrit: 40.8 % (ref 37.5–51.0)
Hemoglobin: 13.2 g/dL (ref 13.0–17.7)
MCH: 31.2 pg (ref 26.6–33.0)
MCHC: 32.4 g/dL (ref 31.5–35.7)
MCV: 97 fL (ref 79–97)
Platelets: 200 10*3/uL (ref 150–450)
RBC: 4.23 x10E6/uL (ref 4.14–5.80)
RDW: 12.4 % (ref 11.6–15.4)
WBC: 6.5 10*3/uL (ref 3.4–10.8)

## 2019-07-05 MED ORDER — SODIUM CHLORIDE 0.9% FLUSH: 3.0000 mL | Freq: Two times a day (BID) | INTRAVENOUS | Status: DC

## 2019-07-07 ENCOUNTER — Ambulatory Visit (HOSPITAL_COMMUNITY)
Admission: RE | Admit: 2019-07-07 | Discharge: 2019-07-08 | Disposition: A | Discharge status: Home / Self Care | Payer: Medicare HMO | Attending: Cardiovascular Disease | Admitting: Cardiovascular Disease

## 2019-07-07 ENCOUNTER — Encounter (HOSPITAL_COMMUNITY)
Admission: RE | Disposition: A | Discharge status: Home / Self Care | Payer: Self-pay | Source: Home / Self Care | Attending: Cardiovascular Disease

## 2019-07-07 ENCOUNTER — Other Ambulatory Visit: Payer: Self-pay

## 2019-07-07 DIAGNOSIS — I2510 Atherosclerotic heart disease of native coronary artery without angina pectoris: Secondary | ICD-10-CM | POA: Diagnosis not present

## 2019-07-07 DIAGNOSIS — Z79899 Other long term (current) drug therapy: Secondary | ICD-10-CM | POA: Diagnosis not present

## 2019-07-07 DIAGNOSIS — I252 Old myocardial infarction: Secondary | ICD-10-CM | POA: Insufficient documentation

## 2019-07-07 DIAGNOSIS — I495 Sick sinus syndrome: Secondary | ICD-10-CM | POA: Insufficient documentation

## 2019-07-07 DIAGNOSIS — G4733 Obstructive sleep apnea (adult) (pediatric): Secondary | ICD-10-CM | POA: Insufficient documentation

## 2019-07-07 DIAGNOSIS — I484 Atypical atrial flutter: Secondary | ICD-10-CM | POA: Insufficient documentation

## 2019-07-07 DIAGNOSIS — Z8673 Personal history of transient ischemic attack (TIA), and cerebral infarction without residual deficits: Secondary | ICD-10-CM | POA: Diagnosis not present

## 2019-07-07 DIAGNOSIS — I080 Rheumatic disorders of both mitral and aortic valves: Secondary | ICD-10-CM | POA: Diagnosis not present

## 2019-07-07 DIAGNOSIS — I700 Atherosclerosis of aorta: Secondary | ICD-10-CM | POA: Diagnosis not present

## 2019-07-07 DIAGNOSIS — I110 Hypertensive heart disease with heart failure: Secondary | ICD-10-CM | POA: Diagnosis not present

## 2019-07-07 DIAGNOSIS — Z7901 Long term (current) use of anticoagulants: Secondary | ICD-10-CM | POA: Diagnosis not present

## 2019-07-07 DIAGNOSIS — K219 Gastro-esophageal reflux disease without esophagitis: Secondary | ICD-10-CM | POA: Insufficient documentation

## 2019-07-07 DIAGNOSIS — E119 Type 2 diabetes mellitus without complications: Secondary | ICD-10-CM | POA: Insufficient documentation

## 2019-07-07 DIAGNOSIS — E7800 Pure hypercholesterolemia, unspecified: Secondary | ICD-10-CM | POA: Insufficient documentation

## 2019-07-07 DIAGNOSIS — Z87891 Personal history of nicotine dependence: Secondary | ICD-10-CM | POA: Diagnosis not present

## 2019-07-07 DIAGNOSIS — Z8249 Family history of ischemic heart disease and other diseases of the circulatory system: Secondary | ICD-10-CM | POA: Diagnosis not present

## 2019-07-07 DIAGNOSIS — I4892 Unspecified atrial flutter: Secondary | ICD-10-CM

## 2019-07-07 DIAGNOSIS — I5032 Chronic diastolic (congestive) heart failure: Secondary | ICD-10-CM | POA: Insufficient documentation

## 2019-07-07 DIAGNOSIS — I480 Paroxysmal atrial fibrillation: Secondary | ICD-10-CM | POA: Insufficient documentation

## 2019-07-07 DIAGNOSIS — I712 Thoracic aortic aneurysm, without rupture: Secondary | ICD-10-CM | POA: Insufficient documentation

## 2019-07-07 DIAGNOSIS — Z950 Presence of cardiac pacemaker: Secondary | ICD-10-CM | POA: Diagnosis present

## 2019-07-07 HISTORY — PX: PACEMAKER IMPLANT: EP1218

## 2019-07-07 LAB — GLUCOSE, CAPILLARY: Glucose-Capillary: 96 mg/dL (ref 70–99)

## 2019-07-07 LAB — SURGICAL PCR SCREEN
MRSA, PCR: NEGATIVE
Staphylococcus aureus: NEGATIVE

## 2019-07-07 SURGERY — PACEMAKER IMPLANT
Anesthesia: LOCAL

## 2019-07-07 MED ORDER — AMIODARONE HCL 200 MG PO TABS
200.0000 mg | ORAL_TABLET | Freq: Every day | ORAL | Status: DC
  Administered 2019-07-08: 200 mg via ORAL
  Filled 2019-07-07: qty 1

## 2019-07-07 MED ORDER — MELATONIN 5 MG PO TABS: 5.0000 mg | ORAL_TABLET | Freq: Every evening | ORAL | Status: DC | PRN

## 2019-07-07 MED ORDER — SODIUM CHLORIDE 0.9% FLUSH: 3.0000 mL | INTRAVENOUS | Status: DC | PRN

## 2019-07-07 MED ORDER — AMLODIPINE BESYLATE 10 MG PO TABS
10.0000 mg | ORAL_TABLET | Freq: Every day | ORAL | Status: DC
  Administered 2019-07-08: 10 mg via ORAL
  Filled 2019-07-07: qty 1

## 2019-07-07 MED ORDER — MIDAZOLAM HCL 5 MG/5ML IJ SOLN
INTRAMUSCULAR | Status: DC | PRN
  Administered 2019-07-07: 1 mg via INTRAVENOUS

## 2019-07-07 MED ORDER — FENTANYL CITRATE (PF) 100 MCG/2ML IJ SOLN
INTRAMUSCULAR | Status: DC | PRN
  Administered 2019-07-07: 25 ug via INTRAVENOUS

## 2019-07-07 MED ORDER — MUPIROCIN 2 % EX OINT
1.0000 "application " | TOPICAL_OINTMENT | Freq: Once | CUTANEOUS | Status: AC
  Administered 2019-07-07: 1 via TOPICAL
  Filled 2019-07-07: qty 22

## 2019-07-07 MED ORDER — CEFAZOLIN SODIUM-DEXTROSE 2-4 GM/100ML-% IV SOLN
INTRAVENOUS | Status: AC
  Filled 2019-07-07: qty 100

## 2019-07-07 MED ORDER — ONDANSETRON HCL 4 MG/2ML IJ SOLN: 4.0000 mg | Freq: Four times a day (QID) | INTRAMUSCULAR | Status: DC | PRN

## 2019-07-07 MED ORDER — LIDOCAINE HCL (PF) 1 % IJ SOLN
INTRAMUSCULAR | Status: AC
  Filled 2019-07-07: qty 60

## 2019-07-07 MED ORDER — FENTANYL CITRATE (PF) 100 MCG/2ML IJ SOLN
INTRAMUSCULAR | Status: AC
  Filled 2019-07-07: qty 2

## 2019-07-07 MED ORDER — HYDRALAZINE HCL 50 MG PO TABS
100.0000 mg | ORAL_TABLET | Freq: Once | ORAL | Status: AC
  Administered 2019-07-07: 100 mg via ORAL
  Filled 2019-07-07: qty 2

## 2019-07-07 MED ORDER — CEFAZOLIN SODIUM-DEXTROSE 1-4 GM/50ML-% IV SOLN
1.0000 g | Freq: Four times a day (QID) | INTRAVENOUS | Status: DC
  Administered 2019-07-07 – 2019-07-08 (×2): 1 g via INTRAVENOUS
  Filled 2019-07-07 (×3): qty 50

## 2019-07-07 MED ORDER — LIDOCAINE HCL (PF) 1 % IJ SOLN
INTRAMUSCULAR | Status: DC | PRN
  Administered 2019-07-07: 60 mL via SUBCUTANEOUS

## 2019-07-07 MED ORDER — MIDAZOLAM HCL 5 MG/5ML IJ SOLN
INTRAMUSCULAR | Status: AC
  Filled 2019-07-07: qty 5

## 2019-07-07 MED ORDER — CHLORHEXIDINE GLUCONATE 4 % EX LIQD
4.0000 "application " | Freq: Once | CUTANEOUS | Status: DC
  Filled 2019-07-07: qty 60

## 2019-07-07 MED ORDER — CEFAZOLIN SODIUM-DEXTROSE 2-4 GM/100ML-% IV SOLN
2.0000 g | INTRAVENOUS | Status: AC
  Administered 2019-07-07: 2 g via INTRAVENOUS

## 2019-07-07 MED ORDER — LOSARTAN POTASSIUM 50 MG PO TABS
100.0000 mg | ORAL_TABLET | Freq: Every day | ORAL | Status: DC
  Administered 2019-07-08: 100 mg via ORAL
  Filled 2019-07-07: qty 2

## 2019-07-07 MED ORDER — HYDRALAZINE HCL 50 MG PO TABS
50.0000 mg | ORAL_TABLET | Freq: Two times a day (BID) | ORAL | Status: DC
  Administered 2019-07-07 – 2019-07-08 (×2): 50 mg via ORAL
  Filled 2019-07-07 (×2): qty 1

## 2019-07-07 MED ORDER — HEPARIN (PORCINE) IN NACL 1000-0.9 UT/500ML-% IV SOLN
INTRAVENOUS | Status: AC
  Filled 2019-07-07: qty 500

## 2019-07-07 MED ORDER — MEMANTINE HCL 10 MG PO TABS
10.0000 mg | ORAL_TABLET | Freq: Two times a day (BID) | ORAL | Status: DC
  Administered 2019-07-07 – 2019-07-08 (×2): 10 mg via ORAL
  Filled 2019-07-07 (×3): qty 1

## 2019-07-07 MED ORDER — MELATONIN 3 MG PO TABS
6.0000 mg | ORAL_TABLET | Freq: Every evening | ORAL | Status: DC | PRN
  Administered 2019-07-07: 6 mg via ORAL
  Filled 2019-07-07 (×2): qty 2

## 2019-07-07 MED ORDER — SODIUM CHLORIDE 0.9 % IV SOLN: INTRAVENOUS | Status: DC

## 2019-07-07 MED ORDER — SODIUM CHLORIDE 0.9 % IV SOLN
80.0000 mg | INTRAVENOUS | Status: AC
  Administered 2019-07-07: 80 mg

## 2019-07-07 MED ORDER — SODIUM CHLORIDE 0.9 % IV SOLN
250.0000 mL | INTRAVENOUS | Status: DC
  Administered 2019-07-07: 250 mL via INTRAVENOUS

## 2019-07-07 MED ORDER — ACETAMINOPHEN 325 MG PO TABS
325.0000 mg | ORAL_TABLET | ORAL | Status: DC | PRN
  Administered 2019-07-08: 650 mg via ORAL
  Filled 2019-07-07: qty 2

## 2019-07-07 MED ORDER — GABAPENTIN 100 MG PO CAPS: 100.0000 mg | ORAL_CAPSULE | Freq: Every day | ORAL | Status: DC | PRN

## 2019-07-07 MED ORDER — SODIUM CHLORIDE 0.9 % IV SOLN
INTRAVENOUS | Status: AC
  Filled 2019-07-07: qty 2

## 2019-07-07 MED ORDER — HEPARIN (PORCINE) IN NACL 1000-0.9 UT/500ML-% IV SOLN
INTRAVENOUS | Status: DC | PRN
  Administered 2019-07-07: 500 mL

## 2019-07-07 MED ORDER — HYDRALAZINE HCL 50 MG PO TABS
50.0000 mg | ORAL_TABLET | Freq: Once | ORAL | Status: AC
  Administered 2019-07-07: 50 mg via ORAL
  Filled 2019-07-07: qty 1

## 2019-07-07 SURGICAL SUPPLY — 8 items
CABLE SURGICAL S-101-97-12 (CABLE) ×2 IMPLANT
IPG PACE AZUR XT DR MRI W1DR01 (Pacemaker) ×1 IMPLANT
LEAD CAPSURE NOVUS 5076-52CM (Lead) ×2 IMPLANT
LEAD CAPSURE NOVUS 5076-58CM (Lead) ×2 IMPLANT
PACE AZURE XT DR MRI W1DR01 (Pacemaker) ×2 IMPLANT
PAD PRO RADIOLUCENT 2001M-C (PAD) ×2 IMPLANT
SHEATH 7FR PRELUDE SNAP 13 (SHEATH) ×4 IMPLANT
TRAY PACEMAKER INSERTION (PACKS) ×2 IMPLANT

## 2019-07-07 NOTE — Interval H&P Note (Signed)
History and Physical Interval Note:  07/07/2019 1:08 PM  Jose Patel  has presented today for surgery, with the diagnosis of tachy brady syndrome.  The various methods of treatment have been discussed with the patient and family. After consideration of risks, benefits and other options for treatment, the patient has consented to  Procedure(s): PACEMAKER IMPLANT (N/A) as a surgical intervention.  The patient's history has been reviewed, patient examined, no change in status, stable for surgery.  I have reviewed the patient's chart and labs.  Questions were answered to the patient's satisfaction.     Mckenzye Cutright

## 2019-07-07 NOTE — Op Note (Signed)
Procedure report  Procedure performed: 1. Implantation of new dual chamber permanent pacemaker 2. Fluoroscopy 3. Sedation Reason for procedure: Symptomatic bradycardia due to: Sinus node dysfunction Tachycardia-bradycardia syndrome (paroxysmal atrial flutter) Bradycardia due to necessary medications Procedure performed by: [name], MD Complications: None Estimated blood loss: <10 mL Medications administered during procedure: Ancef 2 g intravenously Vancomycin 1 g intravenously Lidocaine 1% 30 mL locally,  Fentanyl 25 mcg intravenously Versed 2 mg intravenously  During this procedure the patient is administered a total of Versed 1 mg and Fentanyl 25 mcg to achieve and maintain moderate conscious sedation.  The patient's heart rate, blood pressure, and oxygen saturation are monitored continuously during the procedure. The period of conscious sedation is 57 minutes, of which I was present face-to-face 100% of this time.   Device details: Generator Medtronic Davis DR model [id number] serial number [id number] H Right atrial lead Medtronic [id number] serial number [id number] [id number] Right ventricular lead Medtronic [id number] serial number [id number]  Procedure details:  After the risks and benefits of the procedure were discussed the patient provided informed consent and was brought to the cardiac cath lab in the fasting state. The patient was prepped and draped in usual sterile fashion. Local anesthesia with 1% lidocaine was administered to to the left infraclavicular area. A 5-6 cm horizontal incision was made parallel with and 2-3 cm caudal to the left clavicle. Using electrocautery and blunt dissection a prepectoral pocket was created down to the level of the pectoralis major muscle fascia. The pocket was carefully inspected for hemostasis. An antibiotic-soaked sponge was placed in the pocket.  Under fluoroscopic guidance and using the modified Seldinger technique 2 separate  venipunctures were performed to access the left subclavian vein. No difficulty was encountered accessing the vein.  Two J-tip guidewires were subsequently exchanged for two 7 French safe sheaths.  Under fluoroscopic guidance the ventricular lead was advanced to level of the mid to apical right ventricular septum and thet active-fixation helix was deployed. Prominent current of injury was seen. Satisfactory pacing and sensing parameters were recorded. There was no evidence of diaphragmatic stimulation at maximum device output. The safe sheath was peeled away and the lead was secured in place with 2-0 silk.  In similar fashion the right atrial lead was advanced to the level of the atrial appendage. The active-fixation helix was deployed. There was prominent current of injury. Satisfactory  pacing and sensing parameters were recorded. There was no evidence of diaphragmatic stimulation with pacing at maximum device output. The safe sheath was peeled away and the lead was secured in place with 2-0 silk.  The antibiotic-soaked sponge was removed from the pocket. The pocket was flushed with copious amounts of antibiotic solution. Reinspection showed excellent hemostasis..  The ventricular lead was connected to the generator and appropriate ventricular pacing was seen. Subsequently the atrial lead was also connected. Repeat testing of the lead parameters later showed excellent values.  The entire system was then carefully inserted in the pocket with care been taking that the leads and device assumed a comfortable position without pressure on the incision. Great care was taken that the leads be located deep to the generator. The pocket was then closed in layers using 2 layers of 2-0 Vicryl and one layer of 3-0 Vicryl, cutaneous Steristrips, after which a sterile dressing was applied.  At the end of the procedure the following lead parameters were encountered:  Right atrial lead  sensed P waves 2, impedance 532  ohms, threshold 0.5 V at  0.4 ms pulse width.  Right ventricular lead  sensed R waves 15 mV, impedance 703 ohms, threshold 0.75 V at 0.4 ms pulse width.   Cc:

## 2019-07-07 NOTE — Progress Notes (Signed)
Patients BP elevated since he came back from cardiac cath. Previously BP medication given in cath lab, minimally effective. Paged PA on call for cardiology and asked for additional medication. Awaiting in new orders.

## 2019-07-08 ENCOUNTER — Encounter (HOSPITAL_COMMUNITY): Payer: Self-pay | Admitting: Cardiovascular Disease

## 2019-07-08 ENCOUNTER — Ambulatory Visit (HOSPITAL_COMMUNITY): Payer: Medicare HMO

## 2019-07-08 DIAGNOSIS — I080 Rheumatic disorders of both mitral and aortic valves: Secondary | ICD-10-CM | POA: Diagnosis not present

## 2019-07-08 DIAGNOSIS — I110 Hypertensive heart disease with heart failure: Secondary | ICD-10-CM | POA: Diagnosis not present

## 2019-07-08 DIAGNOSIS — I495 Sick sinus syndrome: Secondary | ICD-10-CM | POA: Diagnosis not present

## 2019-07-08 DIAGNOSIS — Z950 Presence of cardiac pacemaker: Secondary | ICD-10-CM | POA: Diagnosis not present

## 2019-07-08 DIAGNOSIS — Z87891 Personal history of nicotine dependence: Secondary | ICD-10-CM | POA: Diagnosis not present

## 2019-07-08 DIAGNOSIS — E119 Type 2 diabetes mellitus without complications: Secondary | ICD-10-CM | POA: Diagnosis not present

## 2019-07-08 DIAGNOSIS — G4733 Obstructive sleep apnea (adult) (pediatric): Secondary | ICD-10-CM | POA: Diagnosis not present

## 2019-07-08 DIAGNOSIS — I5032 Chronic diastolic (congestive) heart failure: Secondary | ICD-10-CM | POA: Diagnosis not present

## 2019-07-08 DIAGNOSIS — I480 Paroxysmal atrial fibrillation: Secondary | ICD-10-CM | POA: Diagnosis not present

## 2019-07-08 DIAGNOSIS — E7800 Pure hypercholesterolemia, unspecified: Secondary | ICD-10-CM | POA: Diagnosis not present

## 2019-07-08 DIAGNOSIS — K219 Gastro-esophageal reflux disease without esophagitis: Secondary | ICD-10-CM | POA: Diagnosis not present

## 2019-07-08 MED ORDER — METOPROLOL SUCCINATE ER 25 MG PO TB24
25.0000 mg | ORAL_TABLET | Freq: Every day | ORAL | Status: DC
  Administered 2019-07-08: 25 mg via ORAL
  Filled 2019-07-08: qty 1

## 2019-07-08 MED ORDER — METOPROLOL SUCCINATE ER 25 MG PO TB24: 25.0000 mg | ORAL_TABLET | Freq: Every day | ORAL | 2 refills | Status: DC

## 2019-07-08 NOTE — Plan of Care (Signed)
  Problem: Elimination: Goal: Will not experience complications related to bowel motility Outcome: Progressing Goal: Will not experience complications related to urinary retention Outcome: Progressing   

## 2019-07-08 NOTE — Progress Notes (Signed)
Progress Note  Patient Name: Jose Patel Date of Encounter: 07/08/2019  Primary Cardiologist: [name], MD   Subjective   Not sore at surgical site. Ready to go home.  Inpatient Medications    Scheduled Meds: . amiodarone  200 mg Oral Daily  . amLODipine  10 mg Oral Daily  . hydrALAZINE  50 mg Oral BID  . losartan  100 mg Oral Daily  . memantine  10 mg Oral BID   Continuous Infusions: .  ceFAZolin (ANCEF) IV 1 g (07/08/19 0522)   PRN Meds: acetaminophen, gabapentin, Melatonin, ondansetron (ZOFRAN) IV   Vital Signs    Vitals:   07/07/19 2245 07/08/19 0055 07/08/19 0338 07/08/19 0724  BP: (!) 189/62 (!) 156/102 (!) 175/67 (!) 162/69  Pulse: 60 (!) 59 60 (!) 59  Resp: 20 20 20 18   Temp: 98.5 F (36.9 C) 98.1 F (36.7 C) 98.5 F (36.9 C) 98.3 F (36.8 C)  TempSrc: Oral Oral Oral Oral  SpO2: 98% 96% 97% 94%  Weight:   79.8 kg   Height:        Intake/Output Summary (Last 24 hours) at 07/08/2019 0753 Last data filed at 07/08/2019 0338 Gross per 24 hour  Intake 350 ml  Output 1800 ml  Net -1450 ml   Last 3 Weights 07/08/2019 07/07/2019 07/07/2019  Weight (lbs) 176 lb 183 lb 10.3 oz 174 lb  Weight (kg) 79.833 kg 83.3 kg 78.926 kg      Telemetry    A paced, V sensed rhythm - Personally Reviewed  ECG    A paced , V sensed, long AV delay, QTc 488 ms - Personally Reviewed  Physical Exam  Surgical site healthy, no hematoma, minimal staining on absorbent dressing GEN: No acute distress.   Neck: No JVD Cardiac: RRR, no murmurs, rubs, or gallops.  Respiratory: Clear to auscultation bilaterally. GI: Soft, nontender, non-distended  MS: No edema; No deformity. Neuro:  Nonfocal  Psych: Normal affect   Labs    High Sensitivity Troponin:  No results for input(s): TROPONINIHS in the last 720 hours.    Chemistry Recent Labs  Lab 07/04/19 1510  NA 142  K 4.7  CL 105  CO2 23  GLUCOSE 98  BUN 24  CREATININE 1.19  CALCIUM 8.4*  GFRNONAA 56*  GFRAA  65     Hematology Recent Labs  Lab 07/04/19 1510  WBC 6.5  RBC 4.23  HGB 13.2  HCT 40.8  MCV 97  MCH 31.2  MCHC 32.4  RDW 12.4  PLT 200    BNPNo results for input(s): BNP, PROBNP in the last 168 hours.   DDimer No results for input(s): DDIMER in the last 168 hours.   Radiology    Dg Chest 2 View  Result Date: 07/08/2019 CLINICAL DATA:  Status post pacemaker insertion. EXAM: CHEST - 2 VIEW COMPARISON:  05/13/2019 FINDINGS: Dual lead left-sided pacemaker in place with leads projecting over the right atrium and ventricle. No pneumothorax. Has normal in size with unchanged mediastinal contours. Atherosclerosis of the aortic arch. No pulmonary edema or confluent airspace disease. No pleural fluid. No acute osseous abnormalities are seen. There is degenerative change in the spine. IMPRESSION: Left-sided pacemaker with leads projecting over the right atrium and ventricle. No pneumothorax. Aortic Atherosclerosis (ICD10-I70.0). Electronically Signed   By: Keith Rake M.D.   On: 07/08/2019 06:20    Cardiac Studies   Pacemaker check this AM: P waves 3.0 mV, atrial impedance 494 ohm, atrial capture threshold 0.5V@0 .4  ms R waves 15.5 mV, atrial impedance 608 ohm, atrial capture threshold 1.0V@0 .4 ms  During atrial lead implantation had atrial tachycardia 130s, 2:1 AV block (also occurred immediately after his cardioversion). None seen overnight. Patient Profile     85 y.o. male with tachy-brady sd and symptomatic bradycardia, s/p uncomplicated dual chamber PPM implantation.  Assessment & Plan    DC home. Restart Xarelto tonight. Metoprolol succinate 25 mg daily (can continue to take the immediate release metoprolol tartrate if needed for tachycardia breakthrough events). BP high yesterday, but improving. Wound care instructions and activity restrictions reviewed. Wound check 14 days, office visit 3 months.  For questions or updates, please contact [place] Please  consult [email or web address] for contact info under        Signed, [name], MD  07/08/2019, 7:53 AM

## 2019-07-08 NOTE — Discharge Summary (Signed)
Discharge Summary    Patient ID: Jose Patel MRN: [id number]; DOB: [birth year]  Admit date: 07/07/2019 Discharge date: 07/08/2019  Primary Care Provider: [name], NP  Primary Cardiologist: Thurmon Fair, MD   Discharge Diagnoses    Active Problems:   Tachycardia-bradycardia syndrome [facility])   Paroxysmal atrial flutter [facility])   Pacemaker  Diagnostic Studies/Procedures    07/07/2019 PPM placement, see Epic for OP note _____________   History of Present Illness     Jose Patel is a 85 y.o. 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   Hospital Course     He was recently hospitalized for symptomatic atrial fibrillation. Cardioversion on 05/14/2019 led to atypical atrial flutter that subsequently spontaneously converted to sinus rhythm.  Attempt was made to treat with dofetilide but this was abandoned due to excessive QT interval prolongation.  He was subsequently started on amiodarone. He has a tendency to have moderately severe bradycardia (39 bpm), and presented to an OP appointment with Dr. Royann Shivers to discuss dual-chamber permanent pacemaker implantation for tachycardia-bradycardia syndrome.  At that time, he felt substantially better following cardioversion.  He still had complaints of fatigue, but denies dyspnea at rest or with activity, angina, syncope, focal neurological complaints, falls/injuries or bleeding complications, lower extremity edema, claudication, unexplained weight gain, orthopnea, PND. He is moderately compliant with the use of CPAP.  Plan was to proceed with elective PPM placement which was performed on 07/07/2019 without complication.   PPM check on 07/08/2019 as follows: P waves 3.0 mV, atrial impedance 494 ohm, atrial capture threshold 0.5V@0 .4 ms R  waves 15.5 mV, atrial impedance 608 ohm, atrial capture threshold 1.0V@0 .4 ms  During atrial lead implantation had atrial tachycardia 130s, 2:1 AV block (also occurred immediately after his cardioversion). None seen overnight.  Plan to restart Xarelto this evening, 07/08/2019 Metoprolol succinate 25 mg daily (can continue to take the immediate release metoprolol tartrate if needed for tachycardia breakthrough events). BP high yesterday, but improving. Wound care instructions and activity restrictions reviewed. Wound check 14 days, office visit 3 months.  Consultants: None   The patient was seen and examined by Dr. Royann Shivers who feel that he is stable and ready for discharge today, 07/08/2019.   Did the patient have an acute coronary syndrome (MI, NSTEMI, STEMI, etc) this admission?:  No                               Did the patient have a percutaneous coronary intervention (stent / angioplasty)?:  No.   _____________  Discharge Vitals Blood pressure (!) 162/69, pulse (!) 59, temperature 98.3 F (36.8 C), temperature source Oral, resp. rate 18, height 5\' 10"  (1.778 m), weight 79.8 kg, SpO2 94 %.  Filed Weights   07/07/19 1149 07/07/19 1736 07/08/19 0338  Weight: 78.9 kg 83.3 kg 79.8 kg    Labs & Radiologic Studies   _____________  Dg Chest 2 View  Result Date: 07/08/2019 CLINICAL DATA:  Status post pacemaker insertion. EXAM: CHEST - 2 VIEW COMPARISON:  05/13/2019 FINDINGS: Dual lead left-sided pacemaker in place with leads projecting over the right atrium and ventricle. No pneumothorax. Has normal in size with unchanged mediastinal contours. Atherosclerosis of the aortic arch. No pulmonary edema or confluent airspace disease. No pleural fluid. No acute osseous abnormalities are seen. There is  degenerative change in the spine. IMPRESSION: Left-sided pacemaker with leads projecting over the right atrium and ventricle. No pneumothorax. Aortic Atherosclerosis (ICD10-I70.0). Electronically  Signed   By: Narda Rutherford M.D.   On: 07/08/2019 06:20   Mr Cervical Spine Wo Contrast  Result Date: 06/19/2019 CLINICAL DATA:  Neck and right hand pain. EXAM: MRI CERVICAL SPINE WITHOUT CONTRAST TECHNIQUE: Multiplanar, multisequence MR imaging of the cervical spine was performed. No intravenous contrast was administered. COMPARISON:  None. FINDINGS: Alignment: Trace anterolisthesis at C7-T1. Vertebrae: No fracture, evidence of discitis, or bone lesion. Cord: Normal signal and morphology. Posterior Fossa, vertebral arteries, paraspinal tissues: Negative. Disc levels: C2-C3: Small central disc protrusion. Mild bilateral facet arthropathy and left uncovertebral hypertrophy. Mild bilateral neuroforaminal stenosis. No spinal canal stenosis. C3-C4: Moderate central disc protrusion. Mild left uncovertebral hypertrophy. Moderate right facet arthropathy. Moderate left and mild right neuroforaminal stenosis. No spinal canal stenosis. C4-C5: Small posterior disc osteophyte complex. Mild bilateral facet uncovertebral hypertrophy. Moderate left greater than right neuroforaminal stenosis. No spinal canal stenosis. C5-C6: Small posterior disc osteophyte complex. Partial interbody ankylosis. No stenosis. C6-C7: Small posterior disc osteophyte complex asymmetric to the right. Slight flattening of the right ventral cord. Moderate left greater than right neuroforaminal stenosis. No spinal canal stenosis. C7-T1: Negative disc. Mild bilateral facet arthropathy. No stenosis. IMPRESSION: 1. Multilevel cervical spondylosis as described above. Moderate left greater than right neuroforaminal stenosis at C3-C4, C4-C5, and C6-C7. Electronically Signed   By: Obie Dredge M.D.   On: 06/19/2019 09:31   Mr Lumbar Spine Wo Contrast  Result Date: 06/19/2019 CLINICAL DATA:  Persistent chronic lower back pain EXAM: MRI LUMBAR SPINE WITHOUT CONTRAST TECHNIQUE: Multiplanar, multisequence MR imaging of the lumbar spine was performed. No  intravenous contrast was administered. COMPARISON:  None. FINDINGS: Segmentation: There are 5 non-rib bearing lumbar type vertebral bodies with the last intervertebral disc space labeled as L5-S1. Alignment:  Normal Vertebrae: The vertebral body heights are well maintained. No fracture, marrow edema,or pathologic marrow infiltration. Conus medullaris and cauda equina: Conus extends to the L1 level. Conus and cauda equina appear normal. Paraspinal and other soft tissues: The paraspinal soft tissues and visualized retroperitoneal structures are unremarkable. The sacroiliac joints are intact. T2 bright cystic lesion seen within the lower pole of the left kidney. Disc levels: T12-L1:  No significant canal or neural foraminal narrowing. L1-L2: There is a minimal broad-based disc bulge, however no significant canal or neural foraminal narrowing. L2-L3: There is a broad-based disc bulge with a posterior annular fissure, ligamentum flavum hypertrophy and facet arthrosis. Mild bilateral neural foraminal narrowing is seen. Mild central canal stenosis is also noted. L3-L4: There is a broad-based disc bulge with a left foraminal disc protrusion which contacts the exiting left L3 nerve root within the foramina. There is also ligamentum flavum hypertrophy and facet arthrosis. Moderate bilateral neural foraminal narrowing is seen. The central thecal sac is mildly effaced measuring 9 mm in AP diameter. L4-L5: There is a broad-based disc bulge with ligamentum flavum hypertrophy and facet arthrosis. Moderate bilateral neural foraminal narrowing is seen. Mild central canal stenosis is noted. L5-S1: There is a broad-based disc bulge with ligamentum flavum hypertrophy and facet arthrosis. There is moderate bilateral neural foraminal narrowing. IMPRESSION: Lumbar spine spondylosis most notable at L3-L4 with a left foraminal disc protrusion contacting the exiting left L3 nerve root within the foramina. There is moderate bilateral neural  foraminal narrowing and mild central canal stenosis. Also at L2-L3 there is a posterior annular fissure with mild  effacement anterior thecal sac. Electronically Signed   By: Jonna Clark M.D.   On: 06/19/2019 09:59   Disposition   Pt is being discharged home today in good condition.  Follow-up Plans & Appointments    Follow-up Information    [facility] Follow up on 07/17/2019.   Specialty: Cardiology Why: Wound check at 3pm Contact information: [address] [place] [address] [phone]       Thurmon Fair, MD Follow up on 09/15/2019.   Specialty: Cardiology Why: at 1120am  Contact information: [address] [place] [address] [phone]          Discharge Instructions    Call MD for:  difficulty breathing, headache or visual disturbances   Complete by: As directed    Call MD for:  extreme fatigue   Complete by: As directed    Call MD for:  hives   Complete by: As directed    Call MD for:  persistant dizziness or light-headedness   Complete by: As directed    Call MD for:  persistant nausea and vomiting   Complete by: As directed    Call MD for:  redness, tenderness, or signs of infection (pain, swelling, redness, odor or green/yellow discharge around incision site)   Complete by: As directed    Call MD for:  severe uncontrolled pain   Complete by: As directed    Call MD for:  temperature >100.4   Complete by: As directed    Diet - low sodium heart healthy   Complete by: As directed    Diet - low sodium heart healthy   Complete by: As directed    Discharge instructions   Complete by: As directed    Supplemental Discharge Instructions for  Pacemaker/Defibrillator Patients  Activity Do not raise your left/right arm above shoulder level or extend it backward beyond shoulder level for 2 weeks. Wear the arm sling as a reminder or as needed for comfort for 2 weeks. No heavy lifting or vigorous  activity with your left/right arm for 6-8 weeks.    NO DRIVING is preferable for 2 weeks; If absolutely necessary, drive only short, familiar routes. DO wear your seatbelt, even if it crosses over the pacemaker site.  WOUND CARE Keep the wound area clean and dry.  Remove the dressing the day after you return home (usually 48 hours after the procedure). DO NOT SUBMERGE UNDER WATER UNTIL FULLY HEALED (no tub baths, hot tubs, swimming pools, etc.).  You  may shower or take a sponge bath after the dressing is removed. DO NOT SOAK the area and do not allow the shower to directly spray on the site. If you have staples, these will be removed in the office in 7-14 days. If you have tape/steri-strips on your wound, these will fall off; do not pull them off prematurely.   No bandage is needed on the site.  DO  NOT apply any creams, oils, or ointments to the wound area. If you notice any drainage or discharge from the wound, any swelling, excessive redness or bruising at the site, or if you develop a fever > 101? F after you are discharged home, call the office at once.  Special Instructions You are still able to use cellular telephones.  Avoid carrying your cellular phone near your device. When traveling through airports, show security personnel your identification card to avoid being screened in the metal detectors.  Avoid arc welding equipment, MRI testing (  magnetic resonance imaging), TENS units (transcutaneous nerve stimulators).  Call the office for questions about other devices. Avoid electrical appliances that are in poor condition or are not properly grounded. Microwave ovens are safe to be near or to operate.  Additional information for defibrillator patients should your device go off: If your device goes off ONCE and you feel fine afterward, notify the clinic at [phone]. If your device goes off ONCE and you do not feel well afterward, call 911. If your device goes off TWICE or more in  one day, call 911.  DO NOT DRIVE YOURSELF OR A FAMILY MEMBER WITH A DEFIBRILLATOR TO THE HOSPITAL--CALL 911.   Discharge instructions   Complete by: As directed    Restart Xarelto this evening, 07/08/2019   Increase activity slowly   Complete by: As directed    Increase activity slowly   Complete by: As directed       Discharge Medications   Allergies as of 07/08/2019      Reactions   Aricept [donepezil] Diarrhea, Nausea And Vomiting, Other (See Comments)   Cannot tolerate- bradycardia, also   Tropicamide Nausea And Vomiting   Used to dilate eyes (might be this- was switched to something this was tolerable, after this)   Adhesive [tape] Rash      Medication List    TAKE these medications   amiodarone 200 MG tablet Commonly known as: Pacerone Take 1 tablet (200 mg total) by mouth daily.   amLODipine 10 MG tablet Commonly known as: NORVASC TAKE 1 TABLET BY MOUTH EVERY DAY   amoxicillin 500 MG capsule Commonly known as: AMOXIL Take 2,000 mg by mouth See admin instructions. Take 2,000 mg by mouth one hour prior to dental appointment   gabapentin 100 MG capsule Commonly known as: NEURONTIN Take 1 capsule (100 mg total) by mouth daily as needed. What changed: reasons to take this   hydrALAZINE 50 MG tablet Commonly known as: APRESOLINE Take 1 tablet (50 mg total) by mouth 2 (two) times daily.   losartan 50 MG tablet Commonly known as: COZAAR Take 2 tablets (100 mg total) by mouth daily.   Melatonin 5 MG Tabs Take 5 mg by mouth at bedtime as needed (for sleep).   memantine 10 MG tablet Commonly known as: NAMENDA TAKE 1 TABLET BY MOUTH TWICE A DAY   metoprolol succinate 25 MG 24 hr tablet Commonly known as: TOPROL-XL Take 1 tablet (25 mg total) by mouth daily.   metoprolol tartrate 25 MG tablet Commonly known as: LOPRESSOR Take 1 tablet (25 mg total) by mouth as needed (Once daily as needed for a heart rate over 100 bpm.). What changed:   when to take  this  reasons to take this   Red Yeast Rice 600 MG Caps Take 1,200 mg by mouth daily.   Vitamin D3 50 MCG (2000 UT) Tabs Take 2,000 Units by mouth daily with breakfast.   Xarelto 20 MG Tabs tablet Generic drug: rivaroxaban TAKE 1 TABLET (20 MG TOTAL) BY MOUTH DAILY WITH SUPPER. What changed: See the new instructions.       Outstanding Labs/Studies   Wound check   Duration of Discharge Encounter   Greater than 30 minutes including physician time.  Signed, Georgie Chard, NP 07/08/2019, 8:33 AM

## 2019-07-08 NOTE — Progress Notes (Signed)
Pt. Requesting medication for sleep. PRN melatonin given and ineffective. On call for Cardiology paged to make aware.

## 2019-07-16 ENCOUNTER — Other Ambulatory Visit: Payer: Self-pay | Admitting: Nurse Practitioner

## 2019-07-16 DIAGNOSIS — I10 Essential (primary) hypertension: Secondary | ICD-10-CM

## 2019-07-17 ENCOUNTER — Ambulatory Visit (INDEPENDENT_AMBULATORY_CARE_PROVIDER_SITE_OTHER): Payer: Medicare HMO | Admitting: *Deleted

## 2019-07-17 ENCOUNTER — Other Ambulatory Visit: Payer: Self-pay

## 2019-07-17 ENCOUNTER — Telehealth: Payer: Self-pay | Admitting: Emergency Medicine

## 2019-07-17 DIAGNOSIS — I495 Sick sinus syndrome: Secondary | ICD-10-CM | POA: Diagnosis not present

## 2019-07-17 NOTE — Patient Instructions (Signed)
Call the office if you have any redness, drainage, or swelling at incision site.

## 2019-07-18 ENCOUNTER — Ambulatory Visit (INDEPENDENT_AMBULATORY_CARE_PROVIDER_SITE_OTHER): Payer: Medicare HMO | Admitting: Nurse Practitioner

## 2019-07-18 ENCOUNTER — Encounter: Payer: Self-pay | Admitting: Nurse Practitioner

## 2019-07-18 VITALS — BP 140/92 | HR 72 | Temp 98.0°F | Ht 69.0 in | Wt 182.6 lb

## 2019-07-18 DIAGNOSIS — F0390 Unspecified dementia without behavioral disturbance: Secondary | ICD-10-CM

## 2019-07-18 DIAGNOSIS — I10 Essential (primary) hypertension: Secondary | ICD-10-CM

## 2019-07-18 DIAGNOSIS — Z950 Presence of cardiac pacemaker: Secondary | ICD-10-CM | POA: Diagnosis not present

## 2019-07-18 DIAGNOSIS — I495 Sick sinus syndrome: Secondary | ICD-10-CM

## 2019-07-18 DIAGNOSIS — I4892 Unspecified atrial flutter: Secondary | ICD-10-CM | POA: Diagnosis not present

## 2019-07-18 DIAGNOSIS — R69 Illness, unspecified: Secondary | ICD-10-CM | POA: Diagnosis not present

## 2019-07-18 NOTE — Patient Instructions (Signed)
Goal bp <140/90   DASH Eating Plan DASH stands for "Dietary Approaches to Stop Hypertension." The DASH eating plan is a healthy eating plan that has been shown to reduce high blood pressure (hypertension). It may also reduce your risk for type 2 diabetes, heart disease, and stroke. The DASH eating plan may also help with weight loss. What are tips for following this plan?  General guidelines  Avoid eating more than 2,300 mg (milligrams) of salt (sodium) a day. If you have hypertension, you may need to reduce your sodium intake to 1,500 mg a day.  Limit alcohol intake to no more than 1 drink a day for nonpregnant women and 2 drinks a day for men. One drink equals 12 oz of beer, 5 oz of wine, or 1 oz of hard liquor.  Work with your health care provider to maintain a healthy body weight or to lose weight. Ask what an ideal weight is for you.  Get at least 30 minutes of exercise that causes your heart to beat faster (aerobic exercise) most days of the week. Activities may include walking, swimming, or biking.  Work with your health care provider or diet and nutrition specialist (dietitian) to adjust your eating plan to your individual calorie needs. Reading food labels   Check food labels for the amount of sodium per serving. Choose foods with less than 5 percent of the Daily Value of sodium. Generally, foods with less than 300 mg of sodium per serving fit into this eating plan.  To find whole grains, look for the word "whole" as the first word in the ingredient list. Shopping  Buy products labeled as "low-sodium" or "no salt added."  Buy fresh foods. Avoid canned foods and premade or frozen meals. Cooking  Avoid adding salt when cooking. Use salt-free seasonings or herbs instead of table salt or sea salt. Check with your health care provider or pharmacist before using salt substitutes.  Do not fry foods. Cook foods using healthy methods such as baking, boiling, grilling, and broiling  instead.  Cook with heart-healthy oils, such as olive, canola, soybean, or sunflower oil. Meal planning  Eat a balanced diet that includes: ? 5 or more servings of fruits and vegetables each day. At each meal, try to fill half of your plate with fruits and vegetables. ? Up to 6-8 servings of whole grains each day. ? Less than 6 oz of lean meat, poultry, or fish each day. A 3-oz serving of meat is about the same size as a deck of cards. One egg equals 1 oz. ? 2 servings of low-fat dairy each day. ? A serving of nuts, seeds, or beans 5 times each week. ? Heart-healthy fats. Healthy fats called Omega-3 fatty acids are found in foods such as flaxseeds and coldwater fish, like sardines, salmon, and mackerel.  Limit how much you eat of the following: ? Canned or prepackaged foods. ? Food that is high in trans fat, such as fried foods. ? Food that is high in saturated fat, such as fatty meat. ? Sweets, desserts, sugary drinks, and other foods with added sugar. ? Full-fat dairy products.  Do not salt foods before eating.  Try to eat at least 2 vegetarian meals each week.  Eat more home-cooked food and less restaurant, buffet, and fast food.  When eating at a restaurant, ask that your food be prepared with less salt or no salt, if possible. What foods are recommended? The items listed may not be a complete list.  what dietary choices are best for you. Grains Whole-grain or whole-wheat bread. Whole-grain or whole-wheat pasta. Brown rice. Oatmeal. Quinoa. Bulgur. Whole-grain and low-sodium cereals. Pita bread. Low-fat, low-sodium crackers. Whole-wheat flour tortillas. Vegetables Fresh or frozen vegetables (raw, steamed, roasted, or grilled). Low-sodium or reduced-sodium tomato and vegetable juice. Low-sodium or reduced-sodium tomato sauce and tomato paste. Low-sodium or reduced-sodium canned vegetables. Fruits All fresh, dried, or frozen fruit. Canned fruit in natural juice (without  added sugar). Meat and other protein foods Skinless chicken or turkey. Ground chicken or turkey. Pork with fat trimmed off. Fish and seafood. Egg whites. Dried beans, peas, or lentils. Unsalted nuts, nut butters, and seeds. Unsalted canned beans. Lean cuts of beef with fat trimmed off. Low-sodium, lean deli meat. Dairy Low-fat (1%) or fat-free (skim) milk. Fat-free, low-fat, or reduced-fat cheeses. Nonfat, low-sodium ricotta or cottage cheese. Low-fat or nonfat yogurt. Low-fat, low-sodium cheese. Fats and oils Soft margarine without trans fats. Vegetable oil. Low-fat, reduced-fat, or light mayonnaise and salad dressings (reduced-sodium). Canola, safflower, olive, soybean, and sunflower oils. Avocado. Seasoning and other foods Herbs. Spices. Seasoning mixes without salt. Unsalted popcorn and pretzels. Fat-free sweets. What foods are not recommended? The items listed may not be a complete list. Talk with your dietitian about what dietary choices are best for you. Grains Baked goods made with fat, such as croissants, muffins, or some breads. Dry pasta or rice meal packs. Vegetables Creamed or fried vegetables. Vegetables in a cheese sauce. Regular canned vegetables (not low-sodium or reduced-sodium). Regular canned tomato sauce and paste (not low-sodium or reduced-sodium). Regular tomato and vegetable juice (not low-sodium or reduced-sodium). Pickles. Olives. Fruits Canned fruit in a light or heavy syrup. Fried fruit. Fruit in cream or butter sauce. Meat and other protein foods Fatty cuts of meat. Ribs. Fried meat. Bacon. Sausage. Bologna and other processed lunch meats. Salami. Fatback. Hotdogs. Bratwurst. Salted nuts and seeds. Canned beans with added salt. Canned or smoked fish. Whole eggs or egg yolks. Chicken or turkey with skin. Dairy Whole or 2% milk, cream, and half-and-half. Whole or full-fat cream cheese. Whole-fat or sweetened yogurt. Full-fat cheese. Nondairy creamers. Whipped toppings.  Processed cheese and cheese spreads. Fats and oils Butter. Stick margarine. Lard. Shortening. Ghee. Bacon fat. Tropical oils, such as coconut, palm kernel, or palm oil. Seasoning and other foods Salted popcorn and pretzels. Onion salt, garlic salt, seasoned salt, table salt, and sea salt. Worcestershire sauce. Tartar sauce. Barbecue sauce. Teriyaki sauce. Soy sauce, including reduced-sodium. Steak sauce. Canned and packaged gravies. Fish sauce. Oyster sauce. Cocktail sauce. Horseradish that you find on the shelf. Ketchup. Mustard. Meat flavorings and tenderizers. Bouillon cubes. Hot sauce and Tabasco sauce. Premade or packaged marinades. Premade or packaged taco seasonings. Relishes. Regular salad dressings. Where to find more information:  National Heart, Lung, and Blood Institute: www.nhlbi.nih.gov  American Heart Association: www.heart.org Summary  The DASH eating plan is a healthy eating plan that has been shown to reduce high blood pressure (hypertension). It may also reduce your risk for type 2 diabetes, heart disease, and stroke.  With the DASH eating plan, you should limit salt (sodium) intake to 2,300 mg a day. If you have hypertension, you may need to reduce your sodium intake to 1,500 mg a day.  When on the DASH eating plan, aim to eat more fresh fruits and vegetables, whole grains, lean proteins, low-fat dairy, and heart-healthy fats.  Work with your health care provider or diet and nutrition specialist (dietitian) to adjust your eating plan to your   your eating plan to your individual calorie needs. This information is not intended to replace advice given to you by your health care provider. Make sure you discuss any questions you have with your health care provider. Document Released: 07/06/2011 Document Revised: 06/29/2017 Document Reviewed: 07/10/2016 Elsevier Patient Education  2020 [employer]. Constipation, Adult Constipation is when a person has fewer bowel movements in  a week than normal, has difficulty having a bowel movement, or has stools that are dry, hard, or larger than normal. Constipation may be caused by an underlying condition. It may become worse with age if a person takes certain medicines and does not take in enough fluids. Follow these instructions at home: Eating and drinking   Eat foods that have a lot of fiber, such as fresh fruits and vegetables, whole grains, and beans.  Limit foods that are high in fat, low in fiber, or overly processed, such as french fries, hamburgers, cookies, candies, and soda.  Drink enough fluid to keep your urine clear or pale yellow. General instructions  Exercise regularly or as told by your health care provider.  Go to the restroom when you have the urge to go. Do not hold it in.  Take over-the-counter and prescription medicines only as told by your health care provider. These include any fiber supplements.  Practice pelvic floor retraining exercises, such as deep breathing while relaxing the lower abdomen and pelvic floor relaxation during bowel movements.  Watch your condition for any changes.  Keep all follow-up visits as told by your health care provider. This is important. Contact a health care provider if:  You have pain that gets worse.  You have a fever.  You do not have a bowel movement after 4 days.  You vomit.  You are not hungry.  You lose weight.  You are bleeding from the anus.  You have thin, pencil-like stools. Get help right away if:  You have a fever and your symptoms suddenly get worse.  You leak stool or have blood in your stool.  Your abdomen is bloated.  You have severe pain in your abdomen.  You feel dizzy or you faint. This information is not intended to replace advice given to you by your health care provider. Make sure you discuss any questions you have with your health care provider. Document Released: 04/14/2004 Document Revised: 06/29/2017 Document  Reviewed: 01/05/2016 Elsevier Patient Education  2020 [employer].

## 2019-07-18 NOTE — Progress Notes (Signed)
Careteam: Patient Care Team: [name], NP as PCP - General (Geriatric Medicine) [name], MD as PCP - Cardiology (Cardiology) Center, [facility] (Gastroenterology) [name], MD as Consulting Physician  Advanced Directive information Does Patient Have a Medical Advance Directive?: Yes, Type of Advance Directive: [place];Living will, Does patient want to make changes to medical advance directive?: No - Patient declined  Allergies  Allergen Reactions  . Aricept [Donepezil] Diarrhea, Nausea And Vomiting and Other (See Comments)    Cannot tolerate- bradycardia, also  . Tropicamide Nausea And Vomiting    Used to dilate eyes (might be this- was switched to something this was tolerable, after this)  . Adhesive [Tape] Rash    Chief Complaint  Patient presents with  . Hospitalization Follow-up    Follow-up  . Sleeping Problem    Patient has trouble falling back asleep after sleep disturbance from wife with dementia   . Medication Management    Patient not 100 % sure of medications, states daughter manages and she is currently at work      HPI: Patient is a 85 y.o. male seen in the office today to follow up hospitalization due to symptomatic a fib.  Cardioversion on 05/14/2019 led to atypical atrial flutter that subsequently spontaneously converted to sinus rhythm. Attempt was made to treat with dofetilide but this was abandoned due to excessive QT interval prolongation. He was subsequently started on amiodarone. He has a tendency to have moderately severe bradycardia (39bpm), and presented to an OP appointment with Dr. 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 On 07/07/2019 he had elective PPM placement.  Followed up with Cardiologist, yesterday for wound check.  Denies chest pains, palpitations, shortness of breath, no leg swelling. Continues on xarelto for blood thinner, no  abnormal bruising or bleeding noted. No dizziness or headaches.  Reports occasional problems constipation- takes a stool softener and will have  DM every 2-3 times  Wife has dementia and it makes it hard for him to sleep.   Back pain- PT has helped. Better knowing "nothing seriously wrong"  Review of Systems:  Review of Systems  Constitutional: Negative for chills, fever and weight loss.  HENT: Negative for hearing loss.   Respiratory: Negative for cough, sputum production and shortness of breath.   Cardiovascular: Negative for chest pain, palpitations and leg swelling.  Gastrointestinal: Negative for abdominal pain, constipation, diarrhea and heartburn.  Genitourinary: Negative for dysuria, frequency and urgency.  Musculoskeletal: Negative for back pain, falls, joint pain and myalgias.  Skin: Negative.   Neurological: Negative for dizziness and headaches.  Psychiatric/Behavioral: Positive for memory loss. Negative for depression. The patient does not have insomnia.        Sleep disturbances due to wife with dementia.    Past Medical History:  Diagnosis Date  . A-fib ([place])   . Anticoagulated on Coumadin    Per records from [place]   . Aortic regurgitation   . Aortic stenosis   . CHF (congestive heart failure) ([place])   . Coronary artery disease   . CVA (cerebral vascular accident) ([place])    hx of CVA noted on CT from 02/19/18  . Diabetes mellitus, type 2 ([place])   . Gastro-esophageal reflux disease without esophagitis    Per records from previous provider, [facility]  . Heart attack ([place])   . History of CT scan of head 02/19/2018   Per records from previous provider,  [facility]. Chronic changes small vessel disease  . History of ECG    03/26/14- Sinus Tachycardia @ 107 bmp, QRS 78 msec, QT 298 msec, QTc 361 msec. Per records from [place]  . History of Holter monitoring 11/08/2016   Per records from previous  provider, [facility]  . Hypertension   . 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 MI (myocardial infarction) [facility])    Per records from [place]   . Mitral regurgitation   . Mitral stenosis   . Other intervertebral disc degeneration, lumbar region    Per records from previous provider, [facility]  . Radiculopathy, lumbar region    Per records from previous provider, [facility]  . Sinus bradycardia    Per records from [place]   . Sleep apnea   . Transient ischemic attack    10 to 12 years ago  . Typical atrial flutter ([place])    Per records from [place]    Past Surgical History:  Procedure Laterality Date  . CARDIAC ELECTROPHYSIOLOGY STUDY AND ABLATION    . CARDIOVERSION N/A 12/26/2018   Procedure: CARDIOVERSION;  Surgeon: Pixie Casino, MD;  Location: [facility] ENDOSCOPY;  Service: Cardiovascular;  Laterality: N/A;  . CARDIOVERSION N/A 05/14/2019   Procedure: CARDIOVERSION;  Surgeon: [name], MD;  Location: [facility] ENDOSCOPY;  Service: Cardiovascular;  Laterality: N/A;  . CATARACT EXTRACTION    . Debridement to right arm with MRSA infection     4 years ago  . GALLBLADDER SURGERY     Per records from [place]   . PACEMAKER IMPLANT N/A 07/07/2019   Procedure: PACEMAKER IMPLANT;  Surgeon: [name], MD;  Location: [place] CV LAB;  Service: Cardiovascular;  Laterality: N/A;  . TEE WITHOUT CARDIOVERSION N/A 12/26/2018   Procedure: TRANSESOPHAGEAL ECHOCARDIOGRAM (TEE);  Surgeon: Pixie Casino, MD;  Location: [facility] ENDOSCOPY;  Service: Cardiovascular;  Laterality: N/A;   Social History:   reports that he has quit smoking. He has never used smokeless tobacco. He reports that he does not drink alcohol or use drugs.  Family History  Problem Relation Age of Onset  .  Heart failure Mother   . Hyperlipidemia Mother   . Hypertension Son   . Hyperlipidemia Son   . Arthritis Son   . Arthritis Daughter   . Hypertension Son     Medications: Patient's Medications  New Prescriptions   No medications on file  Previous Medications   AMIODARONE (PACERONE) 200 MG TABLET    Take 1 tablet (200 mg total) by mouth daily.   AMLODIPINE (NORVASC) 10 MG TABLET    TAKE 1 TABLET BY MOUTH EVERY DAY   AMOXICILLIN (AMOXIL) 500 MG CAPSULE    Take 2,000 mg by mouth See admin instructions. Take 2,000 mg by mouth one hour prior to dental appointment   CHOLECALCIFEROL (VITAMIN D3) 50 MCG (2000 UT) TABS    Take 2,000 Units by mouth daily with breakfast.   GABAPENTIN (NEURONTIN) 100 MG CAPSULE    Take 1 capsule (100 mg total) by mouth daily as needed.   HYDRALAZINE (APRESOLINE) 50 MG TABLET    TAKE 1 TABLET BY MOUTH TWICE A DAY   LOSARTAN (COZAAR) 50 MG TABLET    Take 2 tablets (100 mg total) by mouth daily.  MELATONIN 5 MG TABS    Take 5 mg by mouth at bedtime as needed (for sleep).    MEMANTINE (NAMENDA) 10 MG TABLET    TAKE 1 TABLET BY MOUTH TWICE A DAY   METOPROLOL SUCCINATE (TOPROL-XL) 25 MG 24 HR TABLET    Take 1 tablet (25 mg total) by mouth daily.   RED YEAST RICE 600 MG CAPS    Take 1,200 mg by mouth daily.    XARELTO 20 MG TABS TABLET    TAKE 1 TABLET (20 MG TOTAL) BY MOUTH DAILY WITH SUPPER.  Modified Medications   No medications on file  Discontinued Medications   METOPROLOL TARTRATE (LOPRESSOR) 25 MG TABLET    Take 1 tablet (25 mg total) by mouth as needed (Once daily as needed for a heart rate over 100 bpm.).    Physical Exam:  Vitals:   07/18/19 1109  BP: (!) 140/92  Pulse: 72  Temp: 98 F (36.7 C)  TempSrc: Temporal  SpO2: 97%  Weight: 182 lb 9.6 oz (82.8 kg)  Height: 5\' 9"  (1.753 m)   Body mass index is 26.97 kg/m. Wt Readings from Last 3 Encounters:  07/18/19 182 lb 9.6 oz (82.8 kg)  07/08/19 176 lb (79.8 kg)  06/20/19 177 lb 12.8 oz (80.6 kg)     Physical Exam Constitutional:      General: He is not in acute distress.    Appearance: He is well-developed. He is not diaphoretic.  HENT:     Head: Normocephalic and atraumatic.     Mouth/Throat:     Pharynx: No oropharyngeal exudate.  Eyes:     Conjunctiva/sclera: Conjunctivae normal.     Pupils: Pupils are equal, round, and reactive to light.  Cardiovascular:     Rate and Rhythm: Normal rate and regular rhythm.     Heart sounds: Normal heart sounds.  Pulmonary:     Effort: Pulmonary effort is normal.     Breath sounds: Normal breath sounds.  Abdominal:     General: Bowel sounds are normal.     Palpations: Abdomen is soft.  Musculoskeletal:        General: No tenderness.     Cervical back: Normal range of motion and neck supple.  Skin:    General: Skin is warm and dry.     Comments: Incision for pacemaker insert healing well.   Neurological:     Mental Status: He is alert and oriented to person, place, and time.     Comments: forgetful     Labs reviewed: Basic Metabolic Panel: Recent Labs    08/12/18 1228 12/20/18 1304 12/24/18 1509 05/14/19 1722 05/15/19 0801 05/16/19 0218 07/04/19 1510  NA  --   --  140 140 139 137 142  K  --   --  4.9 4.1 3.8 4.3 4.7  CL  --   --  102 102 105 109 105  CO2  --   --  22 26 26 22 23   GLUCOSE  --   --  112* 166* 118* 100* 98  BUN  --   --  26 23 36* 34* 24  CREATININE  --   --  1.12 1.27* 1.43* 1.28* 1.19  CALCIUM  --   --  9.5 8.9 8.8* 7.8* 8.4*  MG  --    < >  --  2.0 2.5* 2.2  --   TSH 1.88  --  2.200  --   --   --   --    < > =  values in this interval not displayed.   Liver Function Tests: Recent Labs    08/07/18 1015 03/13/19 1320 05/13/19 1503  AST 16 21 15   ALT 22 26 14   ALKPHOS  --  81 77  BILITOT 0.5 0.7 1.0  PROT 6.9 7.0 7.0  ALBUMIN  --  3.8 3.6   No results for input(s): LIPASE, AMYLASE in the last 8760 hours. No results for input(s): AMMONIA in the last 8760 hours. CBC: Recent Labs     08/07/18 1015 08/07/18 1015 12/20/18 1304 03/13/19 1320 05/13/19 1503 07/04/19 1510  WBC 6.9   < > 7.6 7.5 6.7 6.5  NEUTROABS 3,919  --  5.2 4.8  --   --   HGB 14.7   < > 15.4 14.4 14.4 13.2  HCT 43.2   < > 46.7 44.5 44.9 40.8  MCV 92.3   < > 93.2 94.1 97.6 97  PLT 224   < > 228 230 239 200   < > = values in this interval not displayed.   Lipid Panel: Recent Labs    08/07/18 1015 11/11/18 0939  CHOL 150 115  HDL 35* 38*  LDLCALC 96 63  TRIG 94 65  CHOLHDL 4.3 3.0   TSH: Recent Labs    08/12/18 1228 12/24/18 1509  TSH 1.88 2.200   A1C: Lab Results  Component Value Date   HGBA1C 5.9 (H) 04/18/2019     Assessment/Plan 1. Tachycardia-bradycardia syndrome [facility]) S/p pacemaker, followed closely by cardiology doing well at this time. Continues on amiodarone and  metoprolol   2. Paroxysmal atrial flutter (HCC) Stable, continues on amiodarone and metoprolol.   3. Pacemaker Recent placement, doing well. Incision well healed.  4. Essential hypertension -slightly elevated, goal <140/90. Encouraged to keep record of blood pressure at home and notify if stays elevated. Followed closely by cardiologist. Will continue current regimen at this time. Encouraged dietary modification with low sodium diet as well.   5. Dementia without behavioral disturbance, unspecified dementia type ([place]) Stable, reports daughter oversees his medications.   Next appt: 08/14/2019 as scheduled Jeison Delpilar K. Griffith, [facility] [phone]

## 2019-07-21 DIAGNOSIS — R69 Illness, unspecified: Secondary | ICD-10-CM | POA: Diagnosis not present

## 2019-07-22 LAB — CUP PACEART INCLINIC DEVICE CHECK
Battery Remaining Longevity: 149 mo
Battery Voltage: 3.21 V
Brady Statistic AP VP Percent: 0.29 %
Brady Statistic AP VS Percent: 99.22 %
Brady Statistic AS VP Percent: 0.04 %
Brady Statistic AS VS Percent: 0.45 %
Brady Statistic RA Percent Paced: 99.49 %
Brady Statistic RV Percent Paced: 0.34 %
Date Time Interrogation Session: 20201217150300
Implantable Lead Implant Date: 20201207
Implantable Lead Implant Date: 20201207
Implantable Lead Location: 753859
Implantable Lead Location: 753860
Implantable Lead Model: 5076
Implantable Lead Model: 5076
Implantable Pulse Generator Implant Date: 20201207
Lead Channel Impedance Value: 361 Ohm
Lead Channel Impedance Value: 475 Ohm
Lead Channel Impedance Value: 551 Ohm
Lead Channel Impedance Value: 570 Ohm
Lead Channel Pacing Threshold Amplitude: 0.625 V
Lead Channel Pacing Threshold Amplitude: 0.75 V
Lead Channel Pacing Threshold Pulse Width: 0.4 ms
Lead Channel Pacing Threshold Pulse Width: 0.4 ms
Lead Channel Sensing Intrinsic Amplitude: 19.375 mV
Lead Channel Sensing Intrinsic Amplitude: 3.375 mV
Lead Channel Setting Pacing Amplitude: 3.5 V
Lead Channel Setting Pacing Amplitude: 3.5 V
Lead Channel Setting Pacing Pulse Width: 0.4 ms
Lead Channel Setting Sensing Sensitivity: 1.2 mV

## 2019-07-22 NOTE — Progress Notes (Signed)
Wound check appointment. Steri-strips removed. Wound without redness or edema. Incision edges approximated, wound well healed. Normal device function. Thresholds, sensing, and impedances consistent with implant measurements. Device programmed at 3.5V/auto capture programmed on for extra safety margin until 3 month visit. Histogram distribution appropriate for patient and level of activity. No mode switches or high ventricular rates noted. Patient educated about wound care, arm mobility, lifting restrictions. ROV with Dr Recardo Evangelist on 09/18/19.Next remote 10/16/19.

## 2019-07-23 ENCOUNTER — Ambulatory Visit: Payer: Medicare HMO | Admitting: Cardiovascular Disease

## 2019-07-30 NOTE — Telephone Encounter (Signed)
Opened in error

## 2019-08-05 ENCOUNTER — Other Ambulatory Visit: Payer: Self-pay | Admitting: Nurse Practitioner

## 2019-08-05 DIAGNOSIS — I10 Essential (primary) hypertension: Secondary | ICD-10-CM

## 2019-08-11 ENCOUNTER — Encounter: Payer: Self-pay | Admitting: Nurse Practitioner

## 2019-08-11 ENCOUNTER — Encounter: Payer: Self-pay | Admitting: Pharmacist

## 2019-08-11 DIAGNOSIS — I10 Essential (primary) hypertension: Secondary | ICD-10-CM

## 2019-08-11 DIAGNOSIS — I480 Paroxysmal atrial fibrillation: Secondary | ICD-10-CM

## 2019-08-11 DIAGNOSIS — E1140 Type 2 diabetes mellitus with diabetic neuropathy, unspecified: Secondary | ICD-10-CM

## 2019-08-11 MED ORDER — AMLODIPINE BESYLATE 10 MG PO TABS: 10.0000 mg | ORAL_TABLET | Freq: Every day | ORAL | 1 refills | Status: DC

## 2019-08-11 MED ORDER — HYDRALAZINE HCL 50 MG PO TABS: 50.0000 mg | ORAL_TABLET | Freq: Two times a day (BID) | ORAL | 1 refills | Status: DC

## 2019-08-11 MED ORDER — GABAPENTIN 100 MG PO CAPS: 100.0000 mg | ORAL_CAPSULE | Freq: Every day | ORAL | 1 refills | Status: DC | PRN

## 2019-08-11 MED ORDER — RIVAROXABAN 20 MG PO TABS: ORAL_TABLET | ORAL | 1 refills | Status: DC

## 2019-08-11 NOTE — Telephone Encounter (Signed)
Message routed to [name], NP . Copy of form printed and placed on Jessica's desk if in agreement to sign

## 2019-08-14 ENCOUNTER — Ambulatory Visit: Payer: Medicare HMO | Admitting: Nurse Practitioner

## 2019-08-14 ENCOUNTER — Encounter: Payer: Medicare HMO | Admitting: Internal Medicine

## 2019-08-20 ENCOUNTER — Ambulatory Visit (INDEPENDENT_AMBULATORY_CARE_PROVIDER_SITE_OTHER): Payer: Medicare HMO | Admitting: Nurse Practitioner

## 2019-08-20 ENCOUNTER — Encounter: Payer: Self-pay | Admitting: Nurse Practitioner

## 2019-08-20 ENCOUNTER — Other Ambulatory Visit: Payer: Self-pay

## 2019-08-20 DIAGNOSIS — Z0000 Encounter for general adult medical examination without abnormal findings: Secondary | ICD-10-CM

## 2019-08-20 DIAGNOSIS — R350 Frequency of micturition: Secondary | ICD-10-CM

## 2019-08-20 DIAGNOSIS — N401 Enlarged prostate with lower urinary tract symptoms: Secondary | ICD-10-CM

## 2019-08-20 MED ORDER — TAMSULOSIN HCL 0.4 MG PO CAPS: 0.4000 mg | ORAL_CAPSULE | Freq: Every day | ORAL | 3 refills | Status: DC

## 2019-08-20 NOTE — Progress Notes (Signed)
Subjective:   Jose Patel is a 86 y.o. male who presents for Medicare Annual/Subsequent preventive examination.  Review of Systems:   Cardiac Risk Factors include: advanced age (>30men, >73 women);sedentary lifestyle;hypertension;male gender;family history of premature cardiovascular disease;dyslipidemia     Objective:    Vitals: There were no vitals taken for this visit.  There is no height or weight on file to calculate BMI.  Advanced Directives 07/18/2019 07/07/2019 06/06/2019 05/26/2019 05/14/2019 03/13/2019 12/26/2018  Does Patient Have a Medical Advance Directive? Yes Yes Yes Yes Yes Yes No  Type of Paramedic of [place];Living will - - [place];Living will [place];Living will - -  Does patient want to make changes to medical advance directive? No - Patient declined - - - - - -  Copy of [place] in Chart? Yes - validated most recent copy scanned in chart (See row information) - - - Yes - validated most recent copy scanned in chart (See row information) - -  Would patient like information on creating a medical advance directive? - - - - - - No - Patient declined    Tobacco Social History   Tobacco Use  Smoking Status Former Smoker  Smokeless Tobacco Never Used  Tobacco Comment   Quit at age 36     Counseling given: Not Answered Comment: Quit at age 26   Clinical Intake:     Pain : No/denies pain     BMI - recorded: 25 Nutritional Status: BMI 25 -29 Overweight Diabetes: No  How often do you need to have someone help you when you read instructions, pamphlets, or other written materials from your doctor or pharmacy?: 1 - Never What is the last grade level you completed in school?: high school and trade school        Past Medical History:  Diagnosis Date  . A-fib ([place])   . Anticoagulated on Coumadin    Per records from [place]   . Aortic regurgitation   . Aortic  stenosis   . CHF (congestive heart failure) ([place])   . Coronary artery disease   . CVA (cerebral vascular accident) ([place])    hx of CVA noted on CT from 02/19/18  . Diabetes mellitus, type 2 ([place])   . Gastro-esophageal reflux disease without esophagitis    Per records from previous provider, [facility]  . Heart attack ([place])   . History of CT scan of head 02/19/2018   Per records from previous provider, [facility]. Chronic changes small vessel disease  . History of ECG    03/26/14- Sinus Tachycardia @ 107 bmp, QRS 78 msec, QT 298 msec, QTc 361 msec. Per records from [place]  . History of Holter monitoring 11/08/2016   Per records from previous provider, [facility]  . Hypertension   . 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 MI (myocardial infarction) [facility])    Per records from [place]   . Mitral regurgitation   . Mitral stenosis   . Other intervertebral disc degeneration, lumbar region    Per records from previous provider, [facility]  . Radiculopathy, lumbar region    Per records from previous provider, [facility]  . Sinus bradycardia    Per  records from [place]   . Sleep apnea   . Transient ischemic attack    10 to 12 years ago  . Typical atrial flutter ([place])    Per records from [place]    Past Surgical History:  Procedure Laterality Date  . CARDIAC ELECTROPHYSIOLOGY STUDY AND ABLATION    . CARDIOVERSION N/A 12/26/2018   Procedure: CARDIOVERSION;  Surgeon: Pixie Casino, MD;  Location[facility] ENDOSCOPY;  Service: Cardiovascular;  Laterality: N/A;  . CARDIOVERSION N/A 05/14/2019   Procedure: CARDIOVERSION;  Surgeon: [name], MD;  Location: [facility] ENDOSCOPY;  Service: Cardiovascular;  Laterality: N/A;  .  CATARACT EXTRACTION    . Debridement to right arm with MRSA infection     4 years ago  . GALLBLADDER SURGERY     Per records from [place]   . PACEMAKER IMPLANT N/A 07/07/2019   Procedure: PACEMAKER IMPLANT;  Surgeon: [name], MD;  Location: [place] CV LAB;  Service: Cardiovascular;  Laterality: N/A;  . TEE WITHOUT CARDIOVERSION N/A 12/26/2018   Procedure: TRANSESOPHAGEAL ECHOCARDIOGRAM (TEE);  Surgeon: Pixie Casino, MD;  [facility] ENDOSCOPY;  Service: Cardiovascular;  Laterality: N/A;   Family History  Problem Relation Age of Onset  . Heart failure Mother   . Hyperlipidemia Mother   . Hypertension Son   . Hyperlipidemia Son   . Arthritis Son   . Arthritis Daughter   . Hypertension Son    Social History   Socioeconomic History  . Marital status: Married    Spouse name: Not on file  . Number of children: Not on file  . Years of education: Not on file  . Highest education level: Not on file  Occupational History  . Not on file  Tobacco Use  . Smoking status: Former Research scientist (life sciences)  . Smokeless tobacco: Never Used  . Tobacco comment: Quit at age 26  Substance and Sexual Activity  . Alcohol use: Never  . Drug use: Never  . Sexual activity: Not Currently  Other Topics Concern  . Not on file  Social History Narrative   Social History      Diet?       Do you drink/eat things with caffeine? yes      Marital status?          married                          What year were you married? 1957      Do you live in a house, apartment, assisted living, condo, trailer, etc.? home      Is it one or more stories? 1      How many persons live in your home? 4      Do you have any pets in your home? (please list) yes- 3 dogs, 1 cat      Highest level of education completed? 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           no                           Type & how often?      Advanced Directives      Do  you have a living will? yes      Do you have a DNR form?  If not, do you want to discuss one? no      Do you have signed POA/HPOA for forms? yes      Functional Status      Do you have difficulty bathing or dressing yourself? no      Do you have difficulty preparing food or eating? no      Do you have difficulty managing your medications? no      Do you have difficulty managing your finances? no      Do you have difficulty affording your medications? Yes xarelto   Social Determinants of Health   Financial Resource Strain:   . Difficulty of Paying Living Expenses: Not on file  Food Insecurity:   . Worried About Charity fundraiser in the Last Year: Not on file  . Ran Out of Food in the Last Year: Not on file  Transportation Needs:   . Lack of Transportation (Medical): Not on file  . Lack of Transportation (Non-Medical): Not on file  Physical Activity:   . Days of Exercise per Week: Not on file  . Minutes of Exercise per Session: Not on file  Stress:   . Feeling of Stress : Not on file  Social Connections:   . Frequency of Communication with Friends and Family: Not on file  . Frequency of Social Gatherings with Friends and Family: Not on file  . Attends Religious Services: Not on file  . Active Member of Clubs or Organizations: Not on file  . Attends Archivist Meetings: Not on file  . Marital Status: Not on file    Outpatient Encounter Medications as of 08/20/2019  Medication Sig  . amiodarone (PACERONE) 200 MG tablet Take 1 tablet (200 mg total) by mouth daily.  Marland Kitchen amLODipine (NORVASC) 10 MG tablet Take 1 tablet (10 mg total) by mouth daily.  Marland Kitchen amoxicillin (AMOXIL) 500 MG capsule Take 2,000 mg by mouth See admin instructions. Take 2,000 mg by mouth one hour prior to dental appointment  . Cholecalciferol (VITAMIN D3) 50 MCG (2000 UT) TABS Take 2,000 Units by mouth daily with breakfast.  . gabapentin (NEURONTIN) 100 MG capsule Take  1 capsule (100 mg total) by mouth daily as needed.  . hydrALAZINE (APRESOLINE) 50 MG tablet Take 1 tablet (50 mg total) by mouth 2 (two) times daily.  Marland Kitchen losartan (COZAAR) 50 MG tablet TAKE 2 TABLETS BY MOUTH EVERY DAY  . Magnesium 400 MG CAPS Take 1 tablet by mouth daily.  . Magnesium 500 MG TABS Take 1 tablet by mouth as needed.  . Melatonin 5 MG TABS Take 5 mg by mouth at bedtime as needed (for sleep).   . memantine (NAMENDA) 10 MG tablet TAKE 1 TABLET BY MOUTH TWICE A DAY  . metoprolol succinate (TOPROL-XL) 25 MG 24 hr tablet Take 1 tablet (25 mg total) by mouth daily.  . Red Yeast Rice 600 MG CAPS Take 1,200 mg by mouth daily.   . rivaroxaban (XARELTO) 20 MG TABS tablet TAKE 1 TABLET (20 MG TOTAL) BY MOUTH DAILY WITH SUPPER.   Facility-Administered Encounter Medications as of 08/20/2019  Medication  . sodium chloride flush (NS) 0.9 % injection 3 mL    Activities of Daily Living In your present state of health, do you have any difficulty performing the following activities: 08/20/2019  Hearing? Y  Vision? N  Difficulty concentrating or making decisions? Y  Walking or climbing stairs? N  Dressing or bathing? N  Doing errands, shopping? N  Preparing  Food and eating ? N  Using the Toilet? N  In the past six months, have you accidently leaked urine? Y  Do you have problems with loss of bowel control? N  Managing your Medications? Y  Comment daughter helps  Managing your Finances? Y  Comment daughter helps  Housekeeping or managing your Housekeeping? Y  Comment daughter helps  Some recent data might be hidden    Patient Care Team: [name], NP as PCP - General (Geriatric Medicine) [name], MD as PCP - Cardiology (Cardiology) Center, [facility] (Gastroenterology) [name], MD as Consulting Physician   Assessment:   This is a routine wellness examination for [facility].  Exercise Activities and Dietary recommendations Current Exercise Habits: The patient  does not participate in regular exercise at present  Goals    . DIET - INCREASE WATER INTAKE     Increase water intake and decrease soda     . Increase physical activity     To increase physical activity to 30 minutes 3 days a week        Fall Risk Fall Risk  08/20/2019 07/18/2019 06/06/2019 05/26/2019 04/18/2019  Falls in the past year? 0 0 0 1 1  Number falls in past yr: 0 0 - 1 1  Injury with Fall? 0 0 - 0 0   Is the patient's home free of loose throw rugs in walkways, pet beds, electrical cords, etc?   yes      Grab bars in the bathroom? yes      Handrails on the stairs?   yes      Adequate lighting?   yes  Timed Get Up and Go Performed: na  Depression Screen PHQ 2/9 Scores 08/20/2019 11/20/2018 08/12/2018 06/10/2018  PHQ - 2 Score 0 0 0 0    Cognitive Function MMSE - Mini Mental State Exam 08/12/2018  Orientation to time 4  Orientation to Place 3  Registration 3  Attention/ Calculation 1  Recall 1  Language- name 2 objects 2  Language- repeat 1  Language- follow 3 step command 3  Language- read & follow direction 0  Write a sentence 0  Copy design 0  Total score 18     6CIT Screen 08/20/2019  What Year? 0 points  What month? 0 points  What time? 0 points  Count back from 20 0 points  Months in reverse 4 points  Repeat phrase 8 points  Total Score 12    Immunization History  Administered Date(s) Administered  . Fluad Quad(high Dose 65+) 04/18/2019  . Influenza, High Dose Seasonal PF 05/08/2018  . Influenza-Unspecified 05/15/2017  . Pneumococcal Polysaccharide-23 07/21/2019  . Tdap 07/31/2013  . Zoster Recombinat (Shingrix) 08/12/2018    Qualifies for Shingles Vaccine? Yes. Recommended.   Screening Tests Health Maintenance  Topic Date Due  . OPHTHALMOLOGY EXAM  10/27/1943  . HEMOGLOBIN A1C  10/16/2019  . FOOT EXAM  07/17/2020  . PNA vac Low Risk Adult (2 of 2 - PCV13) 07/20/2020  . TETANUS/TDAP  08/01/2023  . INFLUENZA VACCINE  Completed    Cancer Screenings: Lung: Low Dose CT Chest recommended if Age 78-80 years, 30 pack-year currently smoking OR have quit w/in 15years. Patient does not qualify. Colorectal: aged out  Additional Screenings: na Hepatitis C Screening:      Plan:     I have personally reviewed and noted the following in the patient's chart:   . Medical and social history . Use of alcohol, tobacco or  illicit drugs  . Current medications and supplements . Functional ability and status . Nutritional status . Physical activity . Advanced directives . List of other physicians . Hospitalizations, surgeries, and ER visits in previous 12 months . Vitals . Screenings to include cognitive, depression, and falls . Referrals and appointments  In addition, I have reviewed and discussed with patient certain preventive protocols, quality metrics, and best practice recommendations. A written personalized care plan for preventive services as well as general preventive health recommendations were provided to patient.     [name], NP  08/20/2019

## 2019-08-20 NOTE — Progress Notes (Signed)
This service is provided via telemedicine  No vital signs collected/recorded due to the encounter was a telemedicine visit.   Location of patient (ex: home, work): Home  Patient consents to a telephone visit:  Yes   Location of the provider (ex: office, home):  [facility], Office   Name of any referring provider:  N/A  Names of all persons participating in the telemedicine service and their role in the encounter:  Jose Patel B/CMA, Jose Mustache, NP, daughter, Jose Patel, and Patient    Time spent on call:  6 min with medical assistant      Careteam: Patient Care Team: Jose Chandler, NP as PCP - General (Geriatric Medicine) Jose Klein, MD as PCP - Cardiology (Cardiology) Center, [facility]oscopy (Gastroenterology) Jose Harold, MD as Consulting Physician  Advanced Directive information    Allergies  Allergen Reactions  . Aricept [Donepezil] Diarrhea, Nausea And Vomiting and Other (See Comments)    Cannot tolerate- bradycardia, also  . Tropicamide Nausea And Vomiting    Used to dilate eyes (might be this- was switched to something this was tolerable, after this)  . Adhesive [Tape] Rash    Chief Complaint  Patient presents with  . Acute Visit    Bladder concerns      HPI: Patient is a 86 y.o. male via virtual visit due to increase in urinary frequency. Reports he has had Ian increase in urination for over 6 months.  Weaker stream than what it used to be  Reports he will get up and go to the bathroom all night at times, also with urinary urgency and incontinence. Had 2 accidents in the hospital because the nurse did not get there fast enough.  Last PSA 1.2 in 2020-- daughter reports he started complaining around then.  UA done in January which was negative.   Review of Systems:  Review of Systems  Constitutional: Negative for chills and fever.  Cardiovascular: Negative for leg swelling.  Genitourinary: Positive for frequency and urgency. Negative for  dysuria and hematuria.    Past Medical History:  Diagnosis Date  . A-fib ([place])   . Anticoagulated on Coumadin    Per records from [place]   . Aortic regurgitation   . Aortic stenosis   . CHF (congestive heart failure) ([place])   . Coronary artery disease   . CVA (cerebral vascular accident) ([place])    hx of CVA noted on CT from 02/19/18  . Diabetes mellitus, type 2 ([place])   . Gastro-esophageal reflux disease without esophagitis    Per records from previous provider, [facility]  . Heart attack ([place])   . History of CT scan of head 02/19/2018   Per records from previous provider, [facility]. Chronic changes small vessel disease  . History of ECG    03/26/14- Sinus Tachycardia @ 107 bmp, QRS 78 msec, QT 298 msec, QTc 361 msec. Per records from [place]  . History of Holter monitoring 11/08/2016   Per records from previous provider, [facility]  . Hypertension   . 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 MI (myocardial infarction) [facility])    Per records from [place]   . Mitral regurgitation   . Mitral stenosis   . Other intervertebral disc degeneration, lumbar region    Per records from previous provider,  [facility]  . Radiculopathy, lumbar region    Per records from previous provider, [facility]  . Sinus bradycardia    Per records from [place]   . Sleep apnea   . Transient ischemic attack    10 to 12 years ago  . Typical atrial flutter ([place])    Per records from [place]    Past Surgical History:  Procedure Laterality Date  . CARDIAC ELECTROPHYSIOLOGY STUDY AND ABLATION    . CARDIOVERSION N/A 12/26/2018   Procedure: CARDIOVERSION;  Surgeon: Jose Casino, MD;  Location: [facility] ENDOSCOPY;  Service:  Cardiovascular;  Laterality: N/A;  . CARDIOVERSION N/A 05/14/2019   Procedure: CARDIOVERSION;  Surgeon: Jose Klein, MD;  Location: [facility] ENDOSCOPY;  Service: Cardiovascular;  Laterality: N/A;  . CATARACT EXTRACTION    . Debridement to right arm with MRSA infection     4 years ago  . GALLBLADDER SURGERY     Per records from [place]   . PACEMAKER IMPLANT N/A 07/07/2019   Procedure: PACEMAKER IMPLANT;  Surgeon: Jose Klein, MD;  Location: [place] CV LAB;  Service: Cardiovascular;  Laterality: N/A;  . TEE WITHOUT CARDIOVERSION N/A 12/26/2018   Procedure: TRANSESOPHAGEAL ECHOCARDIOGRAM (TEE);  Surgeon: Jose Casino, MD;  Location: [facility] ENDOSCOPY;  Service: Cardiovascular;  Laterality: N/A;   Social History:   reports that he has quit smoking. He has never used smokeless tobacco. He reports that he does not drink alcohol or use drugs.  Family History  Problem Relation Age of Onset  . Heart failure Mother   . Hyperlipidemia Mother   . Hypertension Son   . Hyperlipidemia Son   . Arthritis Son   . Arthritis Daughter   . Hypertension Son     Medications: Patient's Medications  New Prescriptions   No medications on file  Previous Medications   AMIODARONE (PACERONE) 200 MG TABLET    Take 1 tablet (200 mg total) by mouth daily.   AMLODIPINE (NORVASC) 10 MG TABLET    Take 1 tablet (10 mg total) by mouth daily.   AMOXICILLIN (AMOXIL) 500 MG CAPSULE    Take 2,000 mg by mouth See admin instructions. Take 2,000 mg by mouth one hour prior to dental appointment   CHOLECALCIFEROL (VITAMIN D3) 50 MCG (2000 UT) TABS    Take 2,000 Units by mouth daily with breakfast.   GABAPENTIN (NEURONTIN) 100 MG CAPSULE    Take 1 capsule (100 mg total) by mouth daily as needed.   HYDRALAZINE (APRESOLINE) 50 MG TABLET    Take 1 tablet (50 mg total) by mouth 2 (two) times daily.   LOSARTAN (COZAAR) 50 MG TABLET    TAKE 2 TABLETS BY MOUTH EVERY DAY   MAGNESIUM 400 MG CAPS    Take 1 tablet by mouth  daily.   MAGNESIUM 500 MG TABS    Take 1 tablet by mouth as needed.   MELATONIN 5 MG TABS    Take 5 mg by mouth at bedtime as needed (for sleep).    MEMANTINE (NAMENDA) 10 MG TABLET    TAKE 1 TABLET BY MOUTH TWICE A DAY   METOPROLOL SUCCINATE (TOPROL-XL) 25 MG 24 HR TABLET    Take 1 tablet (25 mg total) by mouth daily.   RED YEAST RICE 600 MG CAPS    Take 1,200 mg by mouth daily.    RIVAROXABAN (XARELTO) 20 MG TABS TABLET    TAKE 1 TABLET (20 MG TOTAL)  BY MOUTH DAILY WITH SUPPER.  Modified Medications   No medications on file  Discontinued Medications   No medications on file    Physical Exam:  There were no vitals filed for this visit. There is no height or weight on file to calculate BMI. Wt Readings from Last 3 Encounters:  07/18/19 182 lb 9.6 oz (82.8 kg)  07/08/19 176 lb (79.8 kg)  06/20/19 177 lb 12.8 oz (80.6 kg)      Labs reviewed: Basic Metabolic Panel: Recent Labs    12/24/18 1509 03/13/19 1320 05/14/19 1722 05/14/19 1722 05/15/19 0801 05/16/19 0218 07/04/19 1510  NA 140   < > 140   < > 139 137 142  K 4.9   < > 4.1   < > 3.8 4.3 4.7  CL 102   < > 102   < > 105 109 105  CO2 22   < > 26   < > 26 22 23   GLUCOSE 112*   < > 166*   < > 118* 100* 98  BUN 26   < > 23   < > 36* 34* 24  CREATININE 1.12   < > 1.27*   < > 1.43* 1.28* 1.19  CALCIUM 9.5   < > 8.9   < > 8.8* 7.8* 8.4*  MG  --    < > 2.0  --  2.5* 2.2  --   TSH 2.200  --   --   --   --   --   --    < > = values in this interval not displayed.   Liver Function Tests: Recent Labs    03/13/19 1320 05/13/19 1503  AST 21 15  ALT 26 14  ALKPHOS 81 77  BILITOT 0.7 1.0  PROT 7.0 7.0  ALBUMIN 3.8 3.6   No results for input(s): LIPASE, AMYLASE in the last 8760 hours. No results for input(s): AMMONIA in the last 8760 hours. CBC: Recent Labs    12/20/18 1304 12/24/18 1509 03/13/19 1320 05/13/19 1503 07/04/19 1510  WBC 7.6   < > 7.5 6.7 6.5  NEUTROABS 5.2  --  4.8  --   --   HGB 15.4   < > 14.4  14.4 13.2  HCT 46.7   < > 44.5 44.9 40.8  MCV 93.2   < > 94.1 97.6 97  PLT 228   < > 230 239 200   < > = values in this interval not displayed.   Lipid Panel: Recent Labs    11/11/18 0939  CHOL 115  HDL 38*  LDLCALC 63  TRIG 65  CHOLHDL 3.0   TSH: Recent Labs    12/24/18 1509  TSH 2.200   A1C: Lab Results  Component Value Date   HGBA1C 5.9 (H) 04/18/2019     Assessment/Plan 1. Benign prostatic hyperplasia with urinary frequency -will start on flomax to see if this improves symptoms. -continues with schedule toileting  - tamsulosin (FLOMAX) 0.4 MG CAPS capsule; Take 1 capsule (0.4 mg total) by mouth daily.  Dispense: 30 capsule; Refill: 3  Next appt: 10/15/2019 as scheduled.  Carlos American. Harle Battiest  [facility] [phone]   Virtual Visit via Video Note  I connected with Jose Patel on 08/20/19 at  3:15 PM EST by a video enabled telemedicine application and verified that I am speaking with the correct person using two identifiers.  Location: Patient: home Provider: office   I discussed the limitations  of evaluation and management by telemedicine and the availability of in person appointments. The patient expressed understanding and agreed to proceed.    I discussed the assessment and treatment plan with the patient. The patient was provided an opportunity to ask questions and all were answered. The patient agreed with the plan and demonstrated an understanding of the instructions.   The patient was advised to call back or seek an in-person evaluation if the symptoms worsen or if the condition fails to improve as anticipated.  I provided 15  minutes of non-face-to-face time during this encounter.  Carlos American. Dewaine Oats, AGNP Avs printed and mailed.

## 2019-08-20 NOTE — Patient Instructions (Signed)
Jose Patel , Thank you for taking time to come for your Medicare Wellness Visit. I appreciate your ongoing commitment to your health goals. Please review the following plan we discussed and let me know if I can assist you in the future.   Screening recommendations/referrals: Colonoscopy aged out Recommended yearly ophthalmology/optometry visit for glaucoma screening and checkup Recommended yearly dental visit for hygiene and checkup  Vaccinations: Influenza vaccine up to date Pneumococcal vaccine up to date Tdap vaccine up to date Shingles vaccine recommended to get after the COVID series is complete    Advanced directives: on file  Conditions/risks identified: progressive memory loss  Next appointment: 1 year  Preventive Care 71 Years and Older, Male Preventive care refers to lifestyle choices and visits with your health care provider that can promote health and wellness. What does preventive care include?  A yearly physical exam. This is also called an annual well check.  Dental exams once or twice a year.  Routine eye exams. Ask your health care provider how often you should have your eyes checked.  Personal lifestyle choices, including:  Daily care of your teeth and gums.  Regular physical activity.  Eating a healthy diet.  Avoiding tobacco and drug use.  Limiting alcohol use.  Practicing safe sex.  Taking low doses of aspirin every day.  Taking vitamin and mineral supplements as recommended by your health care provider. What happens during an annual well check? The services and screenings done by your health care provider during your annual well check will depend on your age, overall health, lifestyle risk factors, and family history of disease. Counseling  Your health care provider may ask you questions about your:  Alcohol use.  Tobacco use.  Drug use.  Emotional well-being.  Home and relationship well-being.  Sexual activity.  Eating  habits.  History of falls.  Memory and ability to understand (cognition).  Work and work Statistician. Screening  You may have the following tests or measurements:  Height, weight, and BMI.  Blood pressure.  Lipid and cholesterol levels. These may be checked every 5 years, or more frequently if you are over 59 years old.  Skin check.  Lung cancer screening. You may have this screening every year starting at age 62 if you have a 30-pack-year history of smoking and currently smoke or have quit within the past 15 years.  Fecal occult blood test (FOBT) of the stool. You may have this test every year starting at age 71.  Flexible sigmoidoscopy or colonoscopy. You may have a sigmoidoscopy every 5 years or a colonoscopy every 10 years starting at age 39.  Prostate cancer screening. Recommendations will vary depending on your family history and other risks.  Hepatitis C blood test.  Hepatitis B blood test.  Sexually transmitted disease (STD) testing.  Diabetes screening. This is done by checking your blood sugar (glucose) after you have not eaten for a while (fasting). You may have this done every 1-3 years.  Abdominal aortic aneurysm (AAA) screening. You may need this if you are a current or former smoker.  Osteoporosis. You may be screened starting at age 49 if you are at high risk. Talk with your health care provider about your test results, treatment options, and if necessary, the need for more tests. Vaccines  Your health care provider may recommend certain vaccines, such as:  Influenza vaccine. This is recommended every year.  Tetanus, diphtheria, and acellular pertussis (Tdap, Td) vaccine. You may need a Td booster every 10  years.  Zoster vaccine. You may need this after age 94.  Pneumococcal 13-valent conjugate (PCV13) vaccine. One dose is recommended after age 76.  Pneumococcal polysaccharide (PPSV23) vaccine. One dose is recommended after age 64. Talk to your health  care provider about which screenings and vaccines you need and how often you need them. This information is not intended to replace advice given to you by your health care provider. Make sure you discuss any questions you have with your health care provider. Document Released: 08/13/2015 Document Revised: 04/05/2016 Document Reviewed: 05/18/2015 Elsevier Interactive Patient Education  2017 [place] Prevention in the Home Falls can cause injuries. They can happen to people of all ages. There are many things you can do to make your home safe and to help prevent falls. What can I do on the outside of my home?  Regularly fix the edges of walkways and driveways and fix any cracks.  Remove anything that might make you trip as you walk through a door, such as a raised step or threshold.  Trim any bushes or trees on the path to your home.  Use bright outdoor lighting.  Clear any walking paths of anything that might make someone trip, such as rocks or tools.  Regularly check to see if handrails are loose or broken. Make sure that both sides of any steps have handrails.  Any raised decks and porches should have guardrails on the edges.  Have any leaves, snow, or ice cleared regularly.  Use sand or salt on walking paths during winter.  Clean up any spills in your garage right away. This includes oil or grease spills. What can I do in the bathroom?  Use night lights.  Install grab bars by the toilet and in the tub and shower. Do not use towel bars as grab bars.  Use non-skid mats or decals in the tub or shower.  If you need to sit down in the shower, use a plastic, non-slip stool.  Keep the floor dry. Clean up any water that spills on the floor as soon as it happens.  Remove soap buildup in the tub or shower regularly.  Attach bath mats securely with double-sided non-slip rug tape.  Do not have throw rugs and other things on the floor that can make you trip. What can I do  in the bedroom?  Use night lights.  Make sure that you have a light by your bed that is easy to reach.  Do not use any sheets or blankets that are too big for your bed. They should not hang down onto the floor.  Have a firm chair that has side arms. You can use this for support while you get dressed.  Do not have throw rugs and other things on the floor that can make you trip. What can I do in the kitchen?  Clean up any spills right away.  Avoid walking on wet floors.  Keep items that you use a lot in easy-to-reach places.  If you need to reach something above you, use a strong step stool that has a grab bar.  Keep electrical cords out of the way.  Do not use floor polish or wax that makes floors slippery. If you must use wax, use non-skid floor wax.  Do not have throw rugs and other things on the floor that can make you trip. What can I do with my stairs?  Do not leave any items on the stairs.  Make sure that  there are handrails on both sides of the stairs and use them. Fix handrails that are broken or loose. Make sure that handrails are as long as the stairways.  Check any carpeting to make sure that it is firmly attached to the stairs. Fix any carpet that is loose or worn.  Avoid having throw rugs at the top or bottom of the stairs. If you do have throw rugs, attach them to the floor with carpet tape.  Make sure that you have a light switch at the top of the stairs and the bottom of the stairs. If you do not have them, ask someone to add them for you. What else can I do to help prevent falls?  Wear shoes that:  Do not have high heels.  Have rubber bottoms.  Are comfortable and fit you well.  Are closed at the toe. Do not wear sandals.  If you use a stepladder:  Make sure that it is fully opened. Do not climb a closed stepladder.  Make sure that both sides of the stepladder are locked into place.  Ask someone to hold it for you, if possible.  Clearly mark  and make sure that you can see:  Any grab bars or handrails.  First and last steps.  Where the edge of each step is.  Use tools that help you move around (mobility aids) if they are needed. These include:  Canes.  Walkers.  Scooters.  Crutches.  Turn on the lights when you go into a dark area. Replace any light bulbs as soon as they burn out.  Set up your furniture so you have a clear path. Avoid moving your furniture around.  If any of your floors are uneven, fix them.  If there are any pets around you, be aware of where they are.  Review your medicines with your doctor. Some medicines can make you feel dizzy. This can increase your chance of falling. Ask your doctor what other things that you can do to help prevent falls. This information is not intended to replace advice given to you by your health care provider. Make sure you discuss any questions you have with your health care provider. Document Released: 05/13/2009 Document Revised: 12/23/2015 Document Reviewed: 08/21/2014 Elsevier Interactive Patient Education  2017 [employer].

## 2019-08-20 NOTE — Progress Notes (Signed)
   This service is provided via telemedicine  No vital signs collected/recorded due to the encounter was a telemedicine visit.   Location of patient (ex: home, work):  Home   Patient consents to a telephone visit:  Yes  Location of the provider (ex: office, home):  [facility], Office   Name of any referring provider:  N/A  Names of all persons participating in the telemedicine service and their role in the encounter:  S.Chrae B/CMA, Sherrie Mustache, NP, and daughter, Jose Patel, Patient  Time spent on call:  11 min with medical assistant

## 2019-09-15 ENCOUNTER — Ambulatory Visit (INDEPENDENT_AMBULATORY_CARE_PROVIDER_SITE_OTHER): Payer: Medicare HMO | Admitting: Cardiovascular Disease

## 2019-09-15 ENCOUNTER — Other Ambulatory Visit: Payer: Self-pay

## 2019-09-15 ENCOUNTER — Encounter: Payer: Self-pay | Admitting: Cardiovascular Disease

## 2019-09-15 VITALS — BP 121/69 | HR 75 | Ht 69.0 in | Wt 179.0 lb

## 2019-09-15 DIAGNOSIS — I77810 Thoracic aortic ectasia: Secondary | ICD-10-CM | POA: Diagnosis not present

## 2019-09-15 DIAGNOSIS — I5189 Other ill-defined heart diseases: Secondary | ICD-10-CM

## 2019-09-15 DIAGNOSIS — I10 Essential (primary) hypertension: Secondary | ICD-10-CM | POA: Diagnosis not present

## 2019-09-15 DIAGNOSIS — E7800 Pure hypercholesterolemia, unspecified: Secondary | ICD-10-CM | POA: Diagnosis not present

## 2019-09-15 DIAGNOSIS — I480 Paroxysmal atrial fibrillation: Secondary | ICD-10-CM

## 2019-09-15 DIAGNOSIS — I351 Nonrheumatic aortic (valve) insufficiency: Secondary | ICD-10-CM

## 2019-09-15 DIAGNOSIS — G4733 Obstructive sleep apnea (adult) (pediatric): Secondary | ICD-10-CM | POA: Diagnosis not present

## 2019-09-15 DIAGNOSIS — Z7901 Long term (current) use of anticoagulants: Secondary | ICD-10-CM | POA: Diagnosis not present

## 2019-09-15 DIAGNOSIS — I495 Sick sinus syndrome: Secondary | ICD-10-CM
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
## 2019-09-15 NOTE — Patient Instructions (Signed)

## 2019-10-15 ENCOUNTER — Encounter: Payer: Self-pay | Admitting: Nurse Practitioner

## 2019-10-15 ENCOUNTER — Other Ambulatory Visit: Payer: Self-pay

## 2019-10-15 ENCOUNTER — Ambulatory Visit (INDEPENDENT_AMBULATORY_CARE_PROVIDER_SITE_OTHER): Payer: Medicare HMO | Admitting: Nurse Practitioner

## 2019-10-15 VITALS — BP 136/78 | HR 78 | Temp 96.9°F | Ht 69.0 in | Wt 177.4 lb

## 2019-10-15 DIAGNOSIS — N401 Enlarged prostate with lower urinary tract symptoms: Secondary | ICD-10-CM | POA: Diagnosis not present

## 2019-10-15 DIAGNOSIS — K5904 Chronic idiopathic constipation: Secondary | ICD-10-CM | POA: Diagnosis not present

## 2019-10-15 DIAGNOSIS — I480 Paroxysmal atrial fibrillation: Secondary | ICD-10-CM | POA: Diagnosis not present

## 2019-10-15 DIAGNOSIS — Z9989 Dependence on other enabling machines and devices: Secondary | ICD-10-CM | POA: Diagnosis not present

## 2019-10-15 DIAGNOSIS — I10 Essential (primary) hypertension: Secondary | ICD-10-CM | POA: Diagnosis not present

## 2019-10-15 DIAGNOSIS — E7800 Pure hypercholesterolemia, unspecified: Secondary | ICD-10-CM

## 2019-10-15 DIAGNOSIS — R350 Frequency of micturition: Secondary | ICD-10-CM | POA: Diagnosis not present

## 2019-10-15 DIAGNOSIS — G4733 Obstructive sleep apnea (adult) (pediatric): Secondary | ICD-10-CM | POA: Diagnosis not present

## 2019-10-15 DIAGNOSIS — E1140 Type 2 diabetes mellitus with diabetic neuropathy, unspecified: Secondary | ICD-10-CM | POA: Diagnosis not present

## 2019-10-15 DIAGNOSIS — F0390 Unspecified dementia without behavioral disturbance: Secondary | ICD-10-CM

## 2019-10-15 DIAGNOSIS — R69 Illness, unspecified: Secondary | ICD-10-CM | POA: Diagnosis not present

## 2019-10-15 NOTE — Patient Instructions (Signed)
To make appt for diabetic eye exam.

## 2019-10-15 NOTE — Progress Notes (Signed)
Careteam: Patient Care Team: [name], NP as PCP - General (Geriatric Medicine) [name], MD as PCP - Cardiology (Cardiology) Center, [facility] (Gastroenterology) [name], MD as Consulting Physician  PLACE OF SERVICE:  [place]  Advanced Directive information    Allergies  Allergen Reactions  . Aricept [Donepezil] Diarrhea, Nausea And Vomiting and Other (See Comments)    Cannot tolerate- bradycardia, also  . Tropicamide Nausea And Vomiting    Used to dilate eyes (might be this- was switched to something this was tolerable, after this)  . Adhesive [Tape] Rash    Chief Complaint  Patient presents with  . Medical Management of Chronic Issues    6 month follow-up   . Memory Loss    Ongoing memory issues  . Orders    Update CPAP   . Medication Management    Flomax is helping  . Constipation    Patient c/o constipation. Trying magnesium OTC     HPI: Patient is a 86 y.o. male for routine follow up  Got 2nd COVID shot last month.   BPH- was started on flomax, reports this has helped the urgency, can hold urine longer.   Now having constipation- worse  Since on flomax. Has started magnesium but not consistently taking.   Hypertension- stable on current regimen  A fib- stable at this time, follow up in 1 year. No palpitations. No abnormal bleeding on xarelto.   Insomnia- not sleeping well due to wife with dementia getting up. Taking melatonin at bedtime.   Memory loss- ongoing memory loss, repeating himself more and forgetting his medicine at times. Noted to have CVA on CT in 2019  OSA- has not been evaluated or adjusted in a few years. Had last sleep study at [facility] in [place] .  Does not use every night.   DM with neuropathy- diet controlled. Needs diabetic eye exam. Uses gabapentin PRN only, rarely needs  Review of Systems:  Review of Systems  Constitutional: Negative for chills, fever and weight loss.  HENT:  Negative for tinnitus.   Respiratory: Negative for cough, sputum production and shortness of breath.   Cardiovascular: Negative for chest pain, palpitations and leg swelling.  Gastrointestinal: Positive for constipation. Negative for abdominal pain, diarrhea and heartburn.  Genitourinary: Positive for frequency (improved ). Negative for dysuria and urgency.  Musculoskeletal: Negative for back pain, falls, joint pain and myalgias.  Skin: Negative.   Neurological: Negative for dizziness and headaches.  Psychiatric/Behavioral: Positive for memory loss. Negative for depression. The patient does not have insomnia.     Past Medical History:  Diagnosis Date  . A-fib ([place])   . Anticoagulated on Coumadin    Per records from [place]   . Aortic regurgitation   . Aortic stenosis   . CHF (congestive heart failure) ([place])   . Coronary artery disease   . CVA (cerebral vascular accident) ([place])    hx of CVA noted on CT from 02/19/18  . Diabetes mellitus, type 2 ([place])   . Gastro-esophageal reflux disease without esophagitis    Per records from previous provider, [facility]  . Heart attack ([place])   . History of CT scan of head 02/19/2018   Per records from previous provider, [facility]. Chronic changes small vessel disease  . History of ECG    03/26/14- Sinus Tachycardia @ 107 bmp, QRS 78 msec, QT 298 msec, QTc 361 msec. Per records  from [place]  . History of Holter monitoring 11/08/2016   Per records from previous provider, [facility] and [facility]  . Hypertension   . 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 MI (myocardial infarction) [facility])    Per records from [place]   . Mitral regurgitation   . Mitral stenosis   . Other intervertebral disc degeneration, lumbar region    Per records from previous  provider, [facility] and [facility]  . Radiculopathy, lumbar region    Per records from previous provider, [facility] and [facility]  . Sinus bradycardia    Per records from [place]   . Sleep apnea   . Transient ischemic attack    10 to 12 years ago  . Typical atrial flutter ([place])    Per records from [place]    Past Surgical History:  Procedure Laterality Date  . CARDIAC ELECTROPHYSIOLOGY STUDY AND ABLATION    . CARDIOVERSION N/A 12/26/2018   Procedure: CARDIOVERSION;  Surgeon: Pixie Casino, MD;  Location: [facility] ENDOSCOPY;  Service: Cardiovascular;  Laterality: N/A;  . CARDIOVERSION N/A 05/14/2019   Procedure: CARDIOVERSION;  Surgeon: [name], MD;  Location: [facility] ENDOSCOPY;  Service: Cardiovascular;  Laterality: N/A;  . CATARACT EXTRACTION    . Debridement to right arm with MRSA infection     4 years ago  . GALLBLADDER SURGERY     Per records from [place]   . PACEMAKER IMPLANT N/A 07/07/2019   Procedure: PACEMAKER IMPLANT;  Surgeon: [name], MD;  Location: [place] CV LAB;  Service: Cardiovascular;  Laterality: N/A;  . TEE WITHOUT CARDIOVERSION N/A 12/26/2018   Procedure: TRANSESOPHAGEAL ECHOCARDIOGRAM (TEE);  Surgeon: Pixie Casino, MD;  Location: [facility] ENDOSCOPY;  Service: Cardiovascular;  Laterality: N/A;   Social History:   reports that he has quit smoking. He has never used smokeless tobacco. He reports that he does not drink alcohol or use drugs.  Family History  Problem Relation Age of Onset  . Heart failure Mother   . Hyperlipidemia Mother   . Hypertension Son   . Hyperlipidemia Son   . Arthritis Son   . Arthritis Daughter   . Hypertension Son     Medications: Patient's Medications  New Prescriptions   No medications on file  Previous Medications   AMIODARONE (PACERONE) 200 MG TABLET    Take 1 tablet (200 mg total) by mouth daily.   AMLODIPINE (NORVASC) 10 MG TABLET    Take 1 tablet (10 mg total) by  mouth daily.   AMOXICILLIN (AMOXIL) 500 MG CAPSULE    Take 2,000 mg by mouth See admin instructions. Take 2,000 mg by mouth one hour prior to dental appointment   CHOLECALCIFEROL (VITAMIN D3) 50 MCG (2000 UT) TABS    Take 2,000 Units by mouth daily with breakfast.   GABAPENTIN (NEURONTIN) 100 MG CAPSULE    Take 1 capsule (100 mg total) by mouth daily as needed.   HYDRALAZINE (APRESOLINE) 50 MG TABLET    Take 1 tablet (50 mg total) by mouth 2 (two) times daily.   LOSARTAN (COZAAR) 50 MG TABLET    TAKE 2 TABLETS BY MOUTH EVERY DAY   MAGNESIUM 400 MG CAPS    Take 1 tablet by mouth daily.   MELATONIN 5 MG TABS    Take 5 mg by mouth at bedtime as needed (for  sleep).    MEMANTINE (NAMENDA) 10 MG TABLET    TAKE 1 TABLET BY MOUTH TWICE A DAY   METOPROLOL SUCCINATE (TOPROL-XL) 25 MG 24 HR TABLET    Take 1 tablet (25 mg total) by mouth daily.   RED YEAST RICE 600 MG CAPS    Take 1,200 mg by mouth daily.    RIVAROXABAN (XARELTO) 20 MG TABS TABLET    TAKE 1 TABLET (20 MG TOTAL) BY MOUTH DAILY WITH SUPPER.   TAMSULOSIN (FLOMAX) 0.4 MG CAPS CAPSULE    Take 1 capsule (0.4 mg total) by mouth daily.  Modified Medications   No medications on file  Discontinued Medications   No medications on file    Physical Exam:  Vitals:   10/15/19 1344  BP: 136/78  Pulse: 78  Temp: (!) 96.9 F (36.1 C)  TempSrc: Temporal  SpO2: 98%  Weight: 177 lb 6.4 oz (80.5 kg)  Height: 5\' 9"  (1.753 m)   Body mass index is 26.2 kg/m. Wt Readings from Last 3 Encounters:  10/15/19 177 lb 6.4 oz (80.5 kg)  09/15/19 179 lb (81.2 kg)  07/18/19 182 lb 9.6 oz (82.8 kg)    Physical Exam Constitutional:      General: He is not in acute distress.    Appearance: He is well-developed. He is not diaphoretic.  HENT:     Head: Normocephalic and atraumatic.  Eyes:     Conjunctiva/sclera: Conjunctivae normal.     Pupils: Pupils are equal, round, and reactive to light.  Cardiovascular:     Rate and Rhythm: Normal rate and regular  rhythm.     Heart sounds: Murmur present.  Pulmonary:     Effort: Pulmonary effort is normal.     Breath sounds: Normal breath sounds.  Abdominal:     General: Bowel sounds are normal.     Palpations: Abdomen is soft.  Musculoskeletal:        General: No tenderness.     Cervical back: Normal range of motion and neck supple.  Skin:    General: Skin is warm and dry.  Neurological:     Mental Status: He is alert and oriented to person, place, and time.  Psychiatric:        Mood and Affect: Mood normal.        Behavior: Behavior normal.     Labs reviewed: Basic Metabolic Panel: Recent Labs    12/24/18 1509 03/13/19 1320 05/14/19 1722 05/14/19 1722 05/15/19 0801 05/16/19 0218 07/04/19 1510  NA 140   < > 140   < > 139 137 142  K 4.9   < > 4.1   < > 3.8 4.3 4.7  CL 102   < > 102   < > 105 109 105  CO2 22   < > 26   < > 26 22 23   GLUCOSE 112*   < > 166*   < > 118* 100* 98  BUN 26   < > 23   < > 36* 34* 24  CREATININE 1.12   < > 1.27*   < > 1.43* 1.28* 1.19  CALCIUM 9.5   < > 8.9   < > 8.8* 7.8* 8.4*  MG  --    < > 2.0  --  2.5* 2.2  --   TSH 2.200  --   --   --   --   --   --    < > = values in this interval not displayed.  Liver Function Tests: Recent Labs    03/13/19 1320 05/13/19 1503  AST 21 15  ALT 26 14  ALKPHOS 81 77  BILITOT 0.7 1.0  PROT 7.0 7.0  ALBUMIN 3.8 3.6   No results for input(s): LIPASE, AMYLASE in the last 8760 hours. No results for input(s): AMMONIA in the last 8760 hours. CBC: Recent Labs    12/20/18 1304 12/24/18 1509 03/13/19 1320 05/13/19 1503 07/04/19 1510  WBC 7.6   < > 7.5 6.7 6.5  NEUTROABS 5.2  --  4.8  --   --   HGB 15.4   < > 14.4 14.4 13.2  HCT 46.7   < > 44.5 44.9 40.8  MCV 93.2   < > 94.1 97.6 97  PLT 228   < > 230 239 200   < > = values in this interval not displayed.   Lipid Panel: Recent Labs    11/11/18 0939  CHOL 115  HDL 38*  LDLCALC 63  TRIG 65  CHOLHDL 3.0   TSH: Recent Labs    12/24/18 1509    TSH 2.200   A1C: Lab Results  Component Value Date   HGBA1C 5.9 (H) 04/18/2019     Assessment/Plan 1. OSA on CPAP -had old sleep study in [place] no recent follow up and needing updated supplies  - Ambulatory referral to Pulmonology  2. Benign prostatic hyperplasia with urinary frequency Improved symptoms with flomax.   3. Type 2 diabetes mellitus with diabetic neuropathy, without long-term current use of insulin ([place]) -diet controlled. Encouraged dietary compliance, routine foot care/monitoring and to keep up with diabetic eye exams through ophthalmology  - Hemoglobin A1c  4. Essential hypertension Stable on current regimen.  - COMPLETE METABOLIC PANEL WITH GFR - CBC with Differential/Platelet  5. Paroxysmal atrial fibrillation (HCC) Stable at this time, s/p conversion and with  dual chamber pacemaker, rate controlled with amiodarone and metoprolol, following with cardiology yearly. -continues on xarelto for anticoagulation.    6. Dementia without behavioral disturbance, unspecified dementia type (HCC) Stable, slow cognitive decline. Helps take care of his wife with advance dementia. Unable to tolerate aricept. Continues on namenda BID  7. Hypercholesteremia -pt with prior CVA however daughter does not want him treated with statin unless markedly elevated LDL due to his memory loss and following the "Bredes[place] protocol" - Lipid Panel  8. Chronic idiopathic constipation -using mag as supplement to help but does not take routinely.  Next appt: 6 months.  Carlos American. [place], [facility] [phone]

## 2019-10-16 ENCOUNTER — Ambulatory Visit (INDEPENDENT_AMBULATORY_CARE_PROVIDER_SITE_OTHER): Payer: Medicare HMO | Admitting: *Deleted

## 2019-10-16 DIAGNOSIS — R69 Illness, unspecified: Secondary | ICD-10-CM | POA: Diagnosis not present

## 2019-10-16 DIAGNOSIS — I495 Sick sinus syndrome: Secondary | ICD-10-CM

## 2019-10-16 LAB — CUP PACEART REMOTE DEVICE CHECK
Battery Remaining Longevity: 122 mo
Battery Voltage: 3.16 V
Brady Statistic AP VP Percent: 0.81 %
Brady Statistic AP VS Percent: 98.42 %
Brady Statistic AS VP Percent: 0.46 %
Brady Statistic AS VS Percent: 0.32 %
Brady Statistic RA Percent Paced: 99.12 %
Brady Statistic RV Percent Paced: 1.42 %
Date Time Interrogation Session: 20210318115215
Implantable Lead Implant Date: 20201207
Implantable Lead Implant Date: 20201207
Implantable Lead Location: 753859
Implantable Lead Location: 753860
Implantable Lead Model: 5076
Implantable Lead Model: 5076
Implantable Pulse Generator Implant Date: 20201207
Lead Channel Impedance Value: 323 Ohm
Lead Channel Impedance Value: 437 Ohm
Lead Channel Impedance Value: 475 Ohm
Lead Channel Impedance Value: 532 Ohm
Lead Channel Pacing Threshold Amplitude: 0.5 V
Lead Channel Pacing Threshold Amplitude: 0.75 V
Lead Channel Pacing Threshold Pulse Width: 0.4 ms
Lead Channel Pacing Threshold Pulse Width: 0.4 ms
Lead Channel Sensing Intrinsic Amplitude: 21.875 mV
Lead Channel Sensing Intrinsic Amplitude: 21.875 mV
Lead Channel Sensing Intrinsic Amplitude: 3.375 mV
Lead Channel Sensing Intrinsic Amplitude: 3.375 mV
Lead Channel Setting Pacing Amplitude: 2.5 V
Lead Channel Setting Pacing Amplitude: 3.25 V
Lead Channel Setting Pacing Pulse Width: 0.4 ms
Lead Channel Setting Sensing Sensitivity: 1.2 mV

## 2019-10-16 LAB — CBC WITH DIFFERENTIAL/PLATELET
Absolute Monocytes: 623 cells/uL (ref 200–950)
Basophils Absolute: 20 cells/uL (ref 0–200)
Basophils Relative: 0.3 %
Eosinophils Absolute: 308 cells/uL (ref 15–500)
Eosinophils Relative: 4.6 %
HCT: 43.7 % (ref 38.5–50.0)
Hemoglobin: 14.3 g/dL (ref 13.2–17.1)
Lymphs Abs: 1916 cells/uL (ref 850–3900)
MCH: 31.2 pg (ref 27.0–33.0)
MCHC: 32.7 g/dL (ref 32.0–36.0)
MCV: 95.4 fL (ref 80.0–100.0)
MPV: 10.5 fL (ref 7.5–12.5)
Monocytes Relative: 9.3 %
Neutro Abs: 3832 cells/uL (ref 1500–7800)
Neutrophils Relative %: 57.2 %
Platelets: 219 10*3/uL (ref 140–400)
RBC: 4.58 10*6/uL (ref 4.20–5.80)
RDW: 13.2 % (ref 11.0–15.0)
Total Lymphocyte: 28.6 %
WBC: 6.7 10*3/uL (ref 3.8–10.8)

## 2019-10-16 LAB — LIPID PANEL
Cholesterol: 187 mg/dL (ref <200)
HDL: 41 mg/dL (ref >=40)
LDL Cholesterol (Calc): 120 mg/dL (calc) — ABNORMAL HIGH
Non-HDL Cholesterol (Calc): 146 mg/dL (calc) — ABNORMAL HIGH (ref <130)
Total CHOL/HDL Ratio: 4.6 (calc) (ref <5.0)
Triglycerides: 138 mg/dL (ref <150)

## 2019-10-16 LAB — HEMOGLOBIN A1C
Hgb A1c MFr Bld: 5.8 % of total Hgb — ABNORMAL HIGH (ref <5.7)
Mean Plasma Glucose: 120 (calc)
eAG (mmol/L): 6.6 (calc)

## 2019-10-16 LAB — COMPLETE METABOLIC PANEL WITH GFR
AG Ratio: 1.5 (calc) (ref 1.0–2.5)
ALT: 40 U/L (ref 9–46)
AST: 26 U/L (ref 10–35)
Albumin: 4.1 g/dL (ref 3.6–5.1)
Alkaline phosphatase (APISO): 100 U/L (ref 35–144)
BUN/Creatinine Ratio: 23 (calc) — ABNORMAL HIGH (ref 6–22)
BUN: 32 mg/dL — ABNORMAL HIGH (ref 7–25)
CO2: 29 mmol/L (ref 20–32)
Calcium: 9.1 mg/dL (ref 8.6–10.3)
Chloride: 108 mmol/L (ref 98–110)
Creat: 1.4 mg/dL — ABNORMAL HIGH (ref 0.70–1.11)
GFR, Est African American: 53 mL/min/{1.73_m2} — ABNORMAL LOW (ref >=60)
GFR, Est Non African American: 46 mL/min/{1.73_m2} — ABNORMAL LOW (ref >=60)
Globulin: 2.8 g/dL (calc) (ref 1.9–3.7)
Glucose, Bld: 93 mg/dL (ref 65–139)
Potassium: 4.7 mmol/L (ref 3.5–5.3)
Sodium: 142 mmol/L (ref 135–146)
Total Bilirubin: 0.4 mg/dL (ref 0.2–1.2)
Total Protein: 6.9 g/dL (ref 6.1–8.1)

## 2019-10-17 NOTE — Progress Notes (Signed)
PPM Remote  

## 2019-11-04 ENCOUNTER — Telehealth: Payer: Self-pay

## 2019-11-04 DIAGNOSIS — R69 Illness, unspecified: Secondary | ICD-10-CM | POA: Diagnosis not present

## 2019-11-04 NOTE — Telephone Encounter (Signed)
Carelink alert received for pt having increased AT/ AF burden.  Presenting EGM appears AT lasting >6hours.  Pt with known hsitory of AT/AF.  Curent meds include Amiodarone 200mg  daily, Metoprolol 25mg  daily.  Xarelto 20mg  daily for [place].    Attempted to reach pt for symptoms assessment and confirmation of meds.  No answer, left VM requesting callback.

## 2019-11-04 NOTE — Telephone Encounter (Signed)
Looks like a slow flutter or atrial tachycardia that we could overdrive pace. Could we try to have him come in to the device clinic tomorrow (if [employer] or Joseph Art are available) or the AFib clinic on Thursday or Friday(I can be there) to try to overdrive and to turn on atrial therapies?

## 2019-11-05 NOTE — Telephone Encounter (Signed)
The pt daughter left a message stating her father did not feel unusual yesterday. She can be reached at [phone] and the pt can be reached at [phone].

## 2019-11-05 NOTE — Telephone Encounter (Signed)
Spoke with pt daughter, tina.  She says that pt has been feeling fine otherwise, not really aware of irregular rhythm.  She is agreeable to pt being seen in AF clinic for possible overdrive pacing reprogramming.  Advised I would have someone from AF clinic contact her to set up time when Dr. Sallyanne Kuster can be available.

## 2019-11-05 NOTE — Telephone Encounter (Signed)
Spoke with patient's dtr, [name], she is aware of appt Friday 4/9 @1 :99 in A-Fib Clinic.

## 2019-11-06 ENCOUNTER — Encounter: Payer: Self-pay | Admitting: Nurse Practitioner

## 2019-11-06 DIAGNOSIS — I10 Essential (primary) hypertension: Secondary | ICD-10-CM

## 2019-11-06 DIAGNOSIS — N401 Enlarged prostate with lower urinary tract symptoms: Secondary | ICD-10-CM

## 2019-11-06 DIAGNOSIS — R350 Frequency of micturition: Secondary | ICD-10-CM

## 2019-11-06 MED ORDER — TAMSULOSIN HCL 0.4 MG PO CAPS: 0.4000 mg | ORAL_CAPSULE | Freq: Every day | ORAL | 1 refills | Status: DC

## 2019-11-06 MED ORDER — LOSARTAN POTASSIUM 100 MG PO TABS: 100.0000 mg | ORAL_TABLET | Freq: Every day | ORAL | 1 refills | Status: DC

## 2019-11-06 NOTE — Telephone Encounter (Signed)
Yep. I will see you tomorrow at 1:45. Thank you

## 2019-11-06 NOTE — Telephone Encounter (Signed)
Excellent, thank you. I am making a habit of inviting myself to the AFib clinic, sorry!

## 2019-11-07 ENCOUNTER — Encounter (HOSPITAL_COMMUNITY): Payer: Self-pay | Admitting: Physician Assistant

## 2019-11-07 ENCOUNTER — Other Ambulatory Visit: Payer: Self-pay

## 2019-11-07 ENCOUNTER — Ambulatory Visit (HOSPITAL_COMMUNITY)
Admission: RE | Admit: 2019-11-07 | Discharge: 2019-11-07 | Disposition: A | Discharge status: Home / Self Care | Payer: Medicare HMO | Source: Ambulatory Visit | Attending: Physician Assistant | Admitting: Physician Assistant

## 2019-11-07 VITALS — BP 116/70 | HR 80 | Ht 69.0 in | Wt 187.0 lb

## 2019-11-07 DIAGNOSIS — I10 Essential (primary) hypertension: Secondary | ICD-10-CM | POA: Insufficient documentation

## 2019-11-07 DIAGNOSIS — I480 Paroxysmal atrial fibrillation: Secondary | ICD-10-CM | POA: Diagnosis not present

## 2019-11-07 DIAGNOSIS — I484 Atypical atrial flutter: Secondary | ICD-10-CM | POA: Diagnosis not present

## 2019-11-07 DIAGNOSIS — D6869 Other thrombophilia: Secondary | ICD-10-CM

## 2019-11-07 DIAGNOSIS — Z950 Presence of cardiac pacemaker: Secondary | ICD-10-CM | POA: Diagnosis not present

## 2019-11-07 DIAGNOSIS — I4892 Unspecified atrial flutter: Secondary | ICD-10-CM | POA: Diagnosis not present

## 2019-11-07 DIAGNOSIS — G4733 Obstructive sleep apnea (adult) (pediatric): Secondary | ICD-10-CM | POA: Insufficient documentation

## 2019-11-07 DIAGNOSIS — I495 Sick sinus syndrome: Secondary | ICD-10-CM

## 2019-11-07 MED ORDER — AMIODARONE HCL 200 MG PO TABS: ORAL_TABLET | ORAL | 0 refills | Status: DC

## 2019-11-07 MED ORDER — AMIODARONE HCL 200 MG PO TABS: 200.0000 mg | ORAL_TABLET | Freq: Two times a day (BID) | ORAL | 6 refills | Status: DC

## 2019-11-07 NOTE — Progress Notes (Signed)
Seen in the atrial fibrillation clinic for pacemaker interrogation and attempt at overdrive pacing of atrial arrhythmia.  Device shows approximately 13 days of uninterrupted atrial tachycardia/atypical atrial flutter with an atrial cycle length around 580 ms with 2-1 AV conduction and a ventricular rate of 80 bpm.  Appears to be mildly symptomatic.  Denies syncope and is not aware of palpitations, but has reduced exercise tolerance.  [employer] as your device is otherwise functioning well.  Pacemaker site well-healed.  Estimated generator longevity over 10 years.  Lead parameters are good.  Burst overdrive pacing was attempted via the atrial lead at decreasing cycle length of 450, 400, 350 ms (failed to engage the tachycardia circuit), 320, 300 ms (engaged the circuit without arrhythmia termination) and finally at 290 ms.  Burst pacing for roughly 4-5 seconds at 290 ms predictably led to termination of the arrhythmia with resumption of normal (atrial paced, ventricular sensed rhythm, but within 5-10 seconds the atrial tachycardia restarted.  "Atrial therapies" turned on.  During observation in the clinic appropriate detection and treatment was demonstrated, with prompt termination of the arrhythmia with each episode of overdrive pacing.  After varying intervals between 5 seconds and 2 minutes of the atrial arrhythmia would restart.  Post mode switch overdrive pacing is turned on at 80 bpm.  Asked him to increase the amiodarone dose to 400 mg daily and will arrange follow-up with EP to discuss alternative antiarrhythmic therapy versus repeat ablation.  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  In 2016 he underwent radiofrequency ablation in [place] (unclear whether this was cavotricuspid isthmus ablation for atrial flutter or actually pulmonary vein isolation for atrial fibrillation).  He was clinically arrhythmia free until 2020.  He was accompanied  today, as always, by his daughter [name] who is a Marine scientist in the NICU in [facility].  [name], MD, [facility] [phone] office [phone] pager

## 2019-11-07 NOTE — Patient Instructions (Signed)
Increase amiodarone 200mg  twice a day  Scheduling will be in touch to set up consult with EP

## 2019-11-07 NOTE — Progress Notes (Signed)
Thanks again!
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
## 2019-11-30 ENCOUNTER — Telehealth: Payer: Self-pay

## 2019-12-01 ENCOUNTER — Other Ambulatory Visit: Payer: Self-pay

## 2019-12-01 ENCOUNTER — Encounter: Payer: Self-pay | Admitting: Internal Medicine

## 2019-12-01 ENCOUNTER — Telehealth (INDEPENDENT_AMBULATORY_CARE_PROVIDER_SITE_OTHER): Payer: Medicare HMO | Admitting: Internal Medicine

## 2019-12-01 VITALS — BP 139/80 | HR 107 | Ht 69.0 in | Wt 180.0 lb

## 2019-12-01 DIAGNOSIS — I119 Hypertensive heart disease without heart failure: Secondary | ICD-10-CM | POA: Diagnosis not present

## 2019-12-01 DIAGNOSIS — I484 Atypical atrial flutter: Secondary | ICD-10-CM | POA: Diagnosis not present

## 2019-12-01 DIAGNOSIS — I4892 Unspecified atrial flutter: Secondary | ICD-10-CM | POA: Diagnosis not present

## 2019-12-01 DIAGNOSIS — D6869 Other thrombophilia: Secondary | ICD-10-CM | POA: Diagnosis not present

## 2019-12-01 DIAGNOSIS — I495 Sick sinus syndrome: Secondary | ICD-10-CM

## 2019-12-01 NOTE — Progress Notes (Signed)
Electrophysiology TeleHealth Note   Due to national recommendations of social distancing due to COVID 19, Audio/video telehealth visit is felt to be most appropriate for this patient at this time.  See MyChart message from today for patient consent regarding telehealth for [facility]   Date:  12/01/2019   ID:  Jose Patel, DOB 05/15/1934, MRN [id number]  Location: home Provider location: [place] Evaluation Performed: New patient consult  PCP:  [name], NP  Cardiologist:  Thurmon Fair, MD  Electrophysiologist:  None   Chief Complaint:  afib  History of Present Illness:    Jose Patel is a 86 y.o. male who presents via audio/video conferencing for a telehealth visit today.   The patient is referred for new consultation regarding afib/ atrial flutter  by Dr Royann Shivers and the AF clinic. The patient is s/p ablation by Dr Delena Serve in 2016.  He did very well initially following ablation.  He has developed recurrent symptomatic atrial arrhythmias.   He was admitted in 2020 for tikosyn load however this was discontinued due to qt prolongation.  He had sinus bradycardia which subsequently limited medical options.  He underwent pacemaker implantation 07/2019.  He has since been placed on amiodarone. He thinks that he is doing well currently with amiodarone.  He has occasional palpitations but overall feels improved.  He appears to frequently be asymptomatic with his arrhythmia.  He is rate controlled and appropriately anticoagulted with xarelto.   Today, he denies symptoms of palpitations, chest pain, shortness of breath, orthopnea, PND, lower extremity edema, claudication, dizziness, presyncope, syncope, bleeding, or neurologic sequela. The patient is tolerating medications without difficulties and is otherwise without complaint today.     Past Medical History:  Diagnosis Date  . A-fib ([facility])   . Anticoagulated on Coumadin    Per [facility] from [facility]   .  Aortic regurgitation   . Aortic stenosis   . CHF (congestive heart failure) ([facility])   . Coronary artery disease   . CVA (cerebral vascular accident) ([facility])    hx of CVA noted on CT from 02/19/18  . Diabetes mellitus, type 2 ([facility])   . Gastro-esophageal reflux disease without esophagitis    Per records from previous provider, [facility]  . Heart attack ([facility])   . History of CT scan of head 02/19/2018   Per records from previous provider, [facility]. Chronic changes small vessel disease  . History of ECG    03/26/14- Sinus Tachycardia @ 107 bmp, QRS 78 msec, QT 298 msec, QTc 361 msec. Per records from [facility]  . History of Holter monitoring 11/08/2016   Per records from previous provider, [facility]  . Hypertension   . 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 MI (myocardial infarction) [facility])    Per records from [facility]   . Mitral regurgitation   . Mitral stenosis   . Other intervertebral disc degeneration, lumbar region    Per records from previous provider, [facility] and [facility]  . Radiculopathy, lumbar region    Per records from previous provider, [facility] and [facility]  . Sinus bradycardia    Per records from [facility]   . Sleep apnea   . Transient ischemic attack    10 to 12 years ago  .  Typical atrial flutter (HCC)    Per records from [facility]     Past Surgical History:  Procedure Laterality Date  . CARDIAC ELECTROPHYSIOLOGY STUDY AND ABLATION    . CARDIOVERSION N/A 12/26/2018   Procedure: CARDIOVERSION;  Surgeon: Chrystie Nose, MD;  Location: [facility] ENDOSCOPY;  Service: Cardiovascular;  Laterality: N/A;  . CARDIOVERSION N/A 05/14/2019   Procedure: CARDIOVERSION;  Surgeon: Thurmon Fair, MD;  Location: [facility] ENDOSCOPY;  Service:  Cardiovascular;  Laterality: N/A;  . CATARACT EXTRACTION    . Debridement to right arm with MRSA infection     4 years ago  . GALLBLADDER SURGERY     Per records from [facility]   . PACEMAKER IMPLANT N/A 07/07/2019   Procedure: PACEMAKER IMPLANT;  Surgeon: Thurmon Fair, MD;  Location: [facility] INVASIVE CV LAB;  Service: Cardiovascular;  Laterality: N/A;  . TEE WITHOUT CARDIOVERSION N/A 12/26/2018   Procedure: TRANSESOPHAGEAL ECHOCARDIOGRAM (TEE);  Surgeon: Chrystie Nose, MD;  Location: [facility] ENDOSCOPY;  Service: Cardiovascular;  Laterality: N/A;    Current Outpatient Medications  Medication Sig Dispense Refill  . amiodarone (PACERONE) 200 MG tablet Take 1 tablet twice a day for 1 month then reduce to 1 tablet a day 60 tablet 0  . amLODipine (NORVASC) 10 MG tablet Take 1 tablet (10 mg total) by mouth daily. 90 tablet 1  . amoxicillin (AMOXIL) 500 MG capsule Take 2,000 mg by mouth See admin instructions. Take 2,000 mg by mouth one hour prior to dental appointment    . Cholecalciferol (VITAMIN D3) 50 MCG (2000 UT) TABS Take 2,000 Units by mouth daily with breakfast.    . gabapentin (NEURONTIN) 100 MG capsule Take 1 capsule (100 mg total) by mouth daily as needed. 90 capsule 1  . hydrALAZINE (APRESOLINE) 50 MG tablet Take 1 tablet (50 mg total) by mouth 2 (two) times daily. 180 tablet 1  . losartan (COZAAR) 100 MG tablet Take 1 tablet (100 mg total) by mouth daily. 90 tablet 1  . Magnesium 400 MG CAPS Take 1 tablet by mouth daily.    . Melatonin 5 MG TABS Take 5 mg by mouth at bedtime as needed (for sleep).     . memantine (NAMENDA) 10 MG tablet TAKE 1 TABLET BY MOUTH TWICE A DAY 180 tablet 1  . metoprolol succinate (TOPROL-XL) 25 MG 24 hr tablet Take 1 tablet (25 mg total) by mouth daily. 90 tablet 2  . Red Yeast Rice 600 MG CAPS Take 1,200 mg by mouth daily.     . rivaroxaban (XARELTO) 20 MG TABS tablet TAKE 1 TABLET (20 MG TOTAL) BY MOUTH DAILY WITH SUPPER. 90 tablet 1  . tamsulosin (FLOMAX)  0.4 MG CAPS capsule Take 1 capsule (0.4 mg total) by mouth daily. 90 capsule 1   Current Facility-Administered Medications  Medication Dose Route Frequency Provider Last Rate Last Admin  . sodium chloride flush (NS) 0.9 % injection 3 mL  3 mL Intravenous Q12H Croitoru, Mihai, MD        Allergies:   Aricept [donepezil], Tropicamide, and Adhesive [tape]   Social History:  The patient  reports that he has quit smoking. He has never used smokeless tobacco. He reports that he does not drink alcohol or use drugs.   Family History:  The patient's family history includes Arthritis in his daughter and son; Heart failure in his mother; Hyperlipidemia in his mother and son; Hypertension in his son and son.    ROS:  Please see the history of present  illness.   All other systems are personally reviewed and negative.    Exam:    Vital Signs:  BP 139/80   Pulse (!) 107   Ht 5\' 9"  (1.753 m)   Wt 180 lb (81.6 kg)   BMI 26.58 kg/m    Well sounding, alert and conversant    Labs/Other Tests and Data Reviewed:    Recent Labs: 12/24/2018: TSH 2.200 05/16/2019: Magnesium 2.2 10/15/2019: ALT 40; BUN 32; Creat 1.40; Hemoglobin 14.3; Platelets 219; Potassium 4.7; Sodium 142   Wt Readings from Last 3 Encounters:  12/01/19 180 lb (81.6 kg)  11/07/19 187 lb (84.8 kg)  10/15/19 177 lb 6.4 oz (80.5 kg)     Other studies personally reviewed: Additional studies/ records that were reviewed today include: AF clinic notes, prior echo,   Review of the above records today demonstrates: moderate to severe LA enlargement by echo 06/2018.     ASSESSMENT & PLAN:    1.  Atrial fibrillation/ atrial flutter The patient has symptomatic, recurrent atrial arrhythmias.  He is s/p ablation by Dr Gary Fleet in 2016.  He has done reasonably well despite moderate to severe LA enlargement and advanced age.  He did not tolerate tikosyn due to qt prolongation during loading.  He is now on amiodarone.   Chads2vasc score is 5.   he is anticoagulated with xarelto.  Continue to follow [facility] closely.  I had a long discussion with the patient and his daughter today regarding treatment options.  He is doing well currently and would prefer to continue his current medicine strategy.  Given his advanced age, I think that this is best. No changes today. We will reassess in 2 months  2. Sinus bradycardia Remotes are uptodate and reviewed Normal pacemaker function  3. Hypertensive cardiovascular disease Stable No change required today  4. OSA Compliance with CPAP is advised  Follow-up:  I will see again in 2 months  Patient Risk:  after full review of this patients clinical status, I feel that they are at moderate risk at this time.   Today, I have spent 20 minutes with the patient with telehealth technology discussing afib .    SignedHillis Range MD, [facility] 12/01/2019 11:21 AM   [facility] [address] [place] [address] [phone] (office) [phone] (fax)

## 2019-12-06 ENCOUNTER — Other Ambulatory Visit: Payer: Self-pay | Admitting: Nurse Practitioner

## 2019-12-06 DIAGNOSIS — F0390 Unspecified dementia without behavioral disturbance: Secondary | ICD-10-CM

## 2019-12-07 ENCOUNTER — Other Ambulatory Visit (HOSPITAL_COMMUNITY): Payer: Self-pay | Admitting: Cardiovascular Disease

## 2019-12-11 ENCOUNTER — Other Ambulatory Visit (HOSPITAL_COMMUNITY): Payer: Self-pay | Admitting: *Deleted

## 2019-12-11 ENCOUNTER — Other Ambulatory Visit (HOSPITAL_COMMUNITY): Payer: Self-pay | Admitting: Cardiovascular Disease

## 2019-12-11 MED ORDER — AMIODARONE HCL 200 MG PO TABS: 200.0000 mg | ORAL_TABLET | Freq: Every day | ORAL | 0 refills | Status: DC

## 2019-12-18 ENCOUNTER — Other Ambulatory Visit: Payer: Self-pay

## 2019-12-18 ENCOUNTER — Ambulatory Visit: Payer: Medicare HMO | Admitting: Pulmonary Disease

## 2019-12-18 ENCOUNTER — Encounter: Payer: Self-pay | Admitting: Pulmonary Disease

## 2019-12-18 VITALS — BP 110/50 | HR 65 | Temp 98.1°F | Ht 70.0 in | Wt 181.8 lb

## 2019-12-18 DIAGNOSIS — G4733 Obstructive sleep apnea (adult) (pediatric): Secondary | ICD-10-CM

## 2019-12-18 NOTE — Progress Notes (Signed)
[place]    [id number]    [birth year]  Primary Care Physician:Eubanks, Janene Harvey, NP  Referring Physician: [name], NP [address] [place]. [place],  [place] [address]  Chief complaint:   Patient with a history of obstructive sleep apnea  HPI:  Has not been using CPAP regularly Relocated to the area couple years ago  Spouse has dementia so has been spending more time trying to take care of her  He does not like using the CPAP but willing to use it it is found to have a positive study Usually goes to bed about 11 PM Asleep in about 30 minutes 1-2 awakenings  Out of bed by about 11  Weight has remained about stable  Remote history of smoking quit about 1957  Denies any dryness of his mouth No headaches Occasional sweating No gasping respirations  Outpatient Encounter Medications as of 12/18/2019  Medication Sig  . amiodarone (PACERONE) 200 MG tablet Take 1 tablet (200 mg total) by mouth daily.  Marland Kitchen amLODipine (NORVASC) 10 MG tablet Take 1 tablet (10 mg total) by mouth daily.  Marland Kitchen amoxicillin (AMOXIL) 500 MG capsule Take 2,000 mg by mouth See admin instructions. Take 2,000 mg by mouth one hour prior to dental appointment  . Cholecalciferol (VITAMIN D3) 50 MCG (2000 UT) TABS Take 2,000 Units by mouth daily with breakfast.  . gabapentin (NEURONTIN) 100 MG capsule Take 1 capsule (100 mg total) by mouth daily as needed.  . hydrALAZINE (APRESOLINE) 50 MG tablet Take 1 tablet (50 mg total) by mouth 2 (two) times daily.  Marland Kitchen losartan (COZAAR) 100 MG tablet Take 1 tablet (100 mg total) by mouth daily.  . Magnesium 400 MG CAPS Take 1 tablet by mouth daily.  . Melatonin 5 MG TABS Take 5 mg by mouth at bedtime as needed (for sleep).   . memantine (NAMENDA) 10 MG tablet TAKE ONE TABLET BY MOUTH TWICE DAILY   . metoprolol succinate (TOPROL-XL) 25 MG 24 hr tablet Take 1 tablet (25 mg total) by mouth daily.  . Red Yeast Rice 600 MG CAPS Take 1,200 mg by mouth daily.   .  rivaroxaban (XARELTO) 20 MG TABS tablet TAKE 1 TABLET (20 MG TOTAL) BY MOUTH DAILY WITH SUPPER.  . tamsulosin (FLOMAX) 0.4 MG CAPS capsule Take 1 capsule (0.4 mg total) by mouth daily.   Facility-Administered Encounter Medications as of 12/18/2019  Medication  . sodium chloride flush (NS) 0.9 % injection 3 mL    Allergies as of 12/18/2019 - Review Complete 11/07/2019  Allergen Reaction Noted  . Aricept [donepezil] Diarrhea, Nausea And Vomiting, and Other (See Comments) 04/18/2019  . Tropicamide Nausea And Vomiting 05/13/2019  . Adhesive [tape] Rash 05/13/2019    Past Medical History:  Diagnosis Date  . A-fib (HCC)   . Anticoagulated on Coumadin    Per records from [facility]   . Aortic regurgitation   . Aortic stenosis   . CHF (congestive heart failure) (HCC)   . Coronary artery disease   . CVA (cerebral vascular accident) (HCC)    hx of CVA noted on CT from 02/19/18  . Diabetes mellitus, type 2 (HCC)   . Gastro-esophageal reflux disease without esophagitis    Per records from previous provider, [facility]  . Heart attack (HCC)   . History of CT scan of head 02/19/2018   Per records from previous provider, [facility]. Chronic changes  small vessel disease  . History of ECG    03/26/14- Sinus Tachycardia @ 107 bmp, QRS 78 msec, QT 298 msec, QTc 361 msec. Per records from [facility]  . History of Holter monitoring 11/08/2016   Per records from previous provider, [facility]  . Hypertension   . 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 MI (myocardial infarction) [facility])    Per records from [facility]   . Mitral regurgitation   . Mitral stenosis   . Other intervertebral disc degeneration, lumbar region    Per records from previous provider, [facility]    . Radiculopathy, lumbar region    Per records from previous provider, [facility]  . Sinus bradycardia    Per records from [facility]   . Sleep apnea   . Transient ischemic attack    10 to 12 years ago  . Typical atrial flutter (HCC)    Per records from [facility]     Past Surgical History:  Procedure Laterality Date  . CARDIAC ELECTROPHYSIOLOGY STUDY AND ABLATION    . CARDIOVERSION N/A 12/26/2018   Procedure: CARDIOVERSION;  Surgeon: Chrystie Nose, MD;  Location: [facility] ENDOSCOPY;  Service: Cardiovascular;  Laterality: N/A;  . CARDIOVERSION N/A 05/14/2019   Procedure: CARDIOVERSION;  Surgeon: Thurmon Fair, MD;  Location: [facility] ENDOSCOPY;  Service: Cardiovascular;  Laterality: N/A;  . CATARACT EXTRACTION    . Debridement to right arm with MRSA infection     4 years ago  . GALLBLADDER SURGERY     Per records from [facility]   . PACEMAKER IMPLANT N/A 07/07/2019   Procedure: PACEMAKER IMPLANT;  Surgeon: Thurmon Fair, MD;  Location: [facility] INVASIVE CV LAB;  Service: Cardiovascular;  Laterality: N/A;  . TEE WITHOUT CARDIOVERSION N/A 12/26/2018   Procedure: TRANSESOPHAGEAL ECHOCARDIOGRAM (TEE);  Surgeon: Chrystie Nose, MD;  Location: [facility] ENDOSCOPY;  Service: Cardiovascular;  Laterality: N/A;    Family History  Problem Relation Age of Onset  . Heart failure Mother   . Hyperlipidemia Mother   . Hypertension Son   . Hyperlipidemia Son   . Arthritis Son   . Arthritis Daughter   . Hypertension Son     Social History   Socioeconomic History  . Marital status: Married    Spouse name: Not on file  . Number of children: Not on file  . Years of education: Not on file  . Highest education level: Not on file  Occupational History  . Not on file  Tobacco Use  . Smoking status: Former Smoker    Years: 1.00    Types: Cigarettes, Cigars    Quit date: 07/31/1953    Years since quitting: 66.4  . Smokeless tobacco: Never Used  . Tobacco comment: Quit  at age 3  Substance and Sexual Activity  . Alcohol use: Never  . Drug use: Never  . Sexual activity: Not Currently  Other Topics Concern  . Not on file  Social History Narrative   Social History      Diet?       Do you drink/eat things with caffeine? yes      Marital status?          married  What year were you married? 1957      Do you live in a house, apartment, assisted living, condo, trailer, etc.? home      Is it one or more stories? 1      How many persons live in your home? 4      Do you have any pets in your home? (please list) yes- 3 dogs, 1 cat      Highest level of education completed? 12 yrs + trade school      Current or past profession: Paediatric nurse, Market researcher TV lineman      Do you exercise?           no                           Type & how often?      Advanced Directives      Do you have a living will? yes      Do you have a DNR form?                                  If not, do you want to discuss one? no      Do you have signed POA/HPOA for forms? yes      Functional Status      Do you have difficulty bathing or dressing yourself? no      Do you have difficulty preparing food or eating? no      Do you have difficulty managing your medications? no      Do you have difficulty managing your finances? no      Do you have difficulty affording your medications? Yes xarelto   Social Determinants of Health   Financial Resource Strain:   . Difficulty of Paying Living Expenses:   Food Insecurity:   . Worried About Programme researcher, broadcasting/film/video in the Last Year:   . Barista in the Last Year:   Transportation Needs:   . Freight forwarder (Medical):   Marland Kitchen Lack of Transportation (Non-Medical):   Physical Activity:   . Days of Exercise per Week:   . Minutes of Exercise per Session:   Stress:   . Feeling of Stress :   Social Connections:   . Frequency of Communication with Friends and Family:   . Frequency of Social Gatherings with  Friends and Family:   . Attends Religious Services:   . Active Member of Clubs or Organizations:   . Attends Banker Meetings:   Marland Kitchen Marital Status:   Intimate Partner Violence:   . Fear of Current or Ex-Partner:   . Emotionally Abused:   Marland Kitchen Physically Abused:   . Sexually Abused:     Review of Systems  Respiratory: Positive for apnea.   Psychiatric/Behavioral: Positive for sleep disturbance.    Vitals:   12/18/19 1504  BP: (!) 110/50  Pulse: 65  Temp: 98.1 F (36.7 C)  SpO2: 97%     Physical Exam  Constitutional: He appears well-developed.  HENT:  Head: Normocephalic.  Crowded oropharynx, Mallampati 4  Eyes: Pupils are equal, round, and reactive to light.  Neck: No tracheal deviation present. No thyromegaly present.  Cardiovascular: Normal rate and regular rhythm.  Pulmonary/Chest: Effort normal and breath sounds normal. No respiratory distress. He has no wheezes. He has no rales. He exhibits no tenderness.  Neurological: He is  alert.  Skin: Skin is warm.  Psychiatric: He has a normal mood and affect.   Results of the Epworth flowsheet 12/18/2019  Sitting and reading 1  Watching TV 0  Sitting, inactive in a public place (e.g. a theatre or a meeting) 0  As a passenger in a car for an hour without a break 0  Lying down to rest in the afternoon when circumstances permit 3  Sitting and talking to someone 0  Sitting quietly after a lunch without alcohol 0  In a car, while stopped for a few minutes in traffic 0  Total score 4    Data Reviewed: Previous study not available  Assessment:  History of obstructive sleep apnea  History of bradycardia at night, has a pacemaker in place  Pathophysiology of sleep disordered breathing discussed  Plan/Recommendations: Schedule patient for home sleep study  Treatment options discussed with patient  We will follow-up in about 2 to 3 months  Encouraged to call with any significant concerns   Virl Diamond MD [facility] 12/18/2019, 3:20 PM  CC: [name], NP

## 2019-12-18 NOTE — Patient Instructions (Signed)
Past history of obstructive sleep apnea  We will order a home sleep study Update you with results  Start treatment with CPAP if positive study  Follow-up in 2- 3 months   Sleep Apnea Sleep apnea is a condition in which breathing pauses or becomes shallow during sleep. Episodes of sleep apnea usually last 10 seconds or longer, and they may occur as many as 20 times an hour. Sleep apnea disrupts your sleep and keeps your body from getting the rest that it needs. This condition can increase your risk of certain health problems, including:  Heart attack.  Stroke.  Obesity.  Diabetes.  Heart failure.  Irregular heartbeat. What are the causes? There are three kinds of sleep apnea:  Obstructive sleep apnea. This kind is caused by a blocked or collapsed airway.  Central sleep apnea. This kind happens when the part of the brain that controls breathing does not send the correct signals to the muscles that control breathing.  Mixed sleep apnea. This is a combination of obstructive and central sleep apnea. The most common cause of this condition is a collapsed or blocked airway. An airway can collapse or become blocked if:  Your throat muscles are abnormally relaxed.  Your tongue and tonsils are larger than normal.  You are overweight.  Your airway is smaller than normal. What increases the risk? You are more likely to develop this condition if you:  Are overweight.  Smoke.  Have a smaller than normal airway.  Are elderly.  Are male.  Drink alcohol.  Take sedatives or tranquilizers.  Have a family history of sleep apnea. What are the signs or symptoms? Symptoms of this condition include:  Trouble staying asleep.  Daytime sleepiness and tiredness.  Irritability.  Loud snoring.  Morning headaches.  Trouble concentrating.  Forgetfulness.  Decreased interest in sex.  Unexplained sleepiness.  Mood swings.  Personality changes.  Feelings of  depression.  Waking up often during the night to urinate.  Dry mouth.  Sore throat. How is this diagnosed? This condition may be diagnosed with:  A medical history.  A physical exam.  A series of tests that are done while you are sleeping (sleep study). These tests are usually done in a sleep lab, but they may also be done at home. How is this treated? Treatment for this condition aims to restore normal breathing and to ease symptoms during sleep. It may involve managing health issues that can affect breathing, such as high blood pressure or obesity. Treatment may include:  Sleeping on your side.  Using a decongestant if you have nasal congestion.  Avoiding the use of depressants, including alcohol, sedatives, and narcotics.  Losing weight if you are overweight.  Making changes to your diet.  Quitting smoking.  Using a device to open your airway while you sleep, such as: ? An oral appliance. This is a custom-made mouthpiece that shifts your lower jaw forward. ? A continuous positive airway pressure (CPAP) device. This device blows air through a mask when you breathe out (exhale). ? A nasal expiratory positive airway pressure (EPAP) device. This device has valves that you put into each nostril. ? A bi-level positive airway pressure (BPAP) device. This device blows air through a mask when you breathe in (inhale) and breathe out (exhale).  Having surgery if other treatments do not work. During surgery, excess tissue is removed to create a wider airway. It is important to get treatment for sleep apnea. Without treatment, this condition can lead to:  High blood pressure.  Coronary artery disease.  In men, an inability to achieve or maintain an erection (impotence).  Reduced thinking abilities. Follow these instructions at home: Lifestyle  Make any lifestyle changes that your health care provider recommends.  Eat a healthy, well-balanced diet.  Take steps to lose weight  if you are overweight.  Avoid using depressants, including alcohol, sedatives, and narcotics.  Do not use any products that contain nicotine or tobacco, such as cigarettes, e-cigarettes, and chewing tobacco. If you need help quitting, ask your health care provider. General instructions  Take over-the-counter and prescription medicines only as told by your health care provider.  If you were given a device to open your airway while you sleep, use it only as told by your health care provider.  If you are having surgery, make sure to tell your health care provider you have sleep apnea. You may need to bring your device with you.  Keep all follow-up visits as told by your health care provider. This is important. Contact a health care provider if:  The device that you received to open your airway during sleep is uncomfortable or does not seem to be working.  Your symptoms do not improve.  Your symptoms get worse. Get help right away if:  You develop: ? Chest pain. ? Shortness of breath. ? Discomfort in your back, arms, or stomach.  You have: ? Trouble speaking. ? Weakness on one side of your body. ? Drooping in your face. These symptoms may represent a serious problem that is an emergency. Do not wait to see if the symptoms will go away. Get medical help right away. Call your local emergency services (911 in the [place] Do not drive yourself to the hospital. Summary  Sleep apnea is a condition in which breathing pauses or becomes shallow during sleep.  The most common cause is a collapsed or blocked airway.  The goal of treatment is to restore normal breathing and to ease symptoms during sleep. This information is not intended to replace advice given to you by your health care provider. Make sure you discuss any questions you have with your health care provider. Document Revised: 01/01/2019 Document Reviewed: 03/12/2018 Elsevier Patient Education  [employer].

## 2020-01-15 ENCOUNTER — Ambulatory Visit (INDEPENDENT_AMBULATORY_CARE_PROVIDER_SITE_OTHER): Payer: Medicare HMO | Admitting: *Deleted

## 2020-01-15 DIAGNOSIS — I495 Sick sinus syndrome: Secondary | ICD-10-CM | POA: Diagnosis not present

## 2020-01-15 LAB — CUP PACEART REMOTE DEVICE CHECK
Battery Remaining Longevity: 149 mo
Battery Voltage: 3.12 V
Brady Statistic AP VP Percent: 5.99 %
Brady Statistic AP VS Percent: 83.03 %
Brady Statistic AS VP Percent: 6.44 %
Brady Statistic AS VS Percent: 4.57 %
Brady Statistic RA Percent Paced: 86.68 %
Brady Statistic RV Percent Paced: 14.05 %
Date Time Interrogation Session: 20210616230437
Implantable Lead Implant Date: 20201207
Implantable Lead Implant Date: 20201207
Implantable Lead Location: 753859
Implantable Lead Location: 753860
Implantable Lead Model: 5076
Implantable Lead Model: 5076
Implantable Pulse Generator Implant Date: 20201207
Lead Channel Impedance Value: 323 Ohm
Lead Channel Impedance Value: 399 Ohm
Lead Channel Impedance Value: 456 Ohm
Lead Channel Impedance Value: 475 Ohm
Lead Channel Pacing Threshold Amplitude: 0.5 V
Lead Channel Pacing Threshold Amplitude: 1.125 V
Lead Channel Pacing Threshold Pulse Width: 0.4 ms
Lead Channel Pacing Threshold Pulse Width: 0.4 ms
Lead Channel Sensing Intrinsic Amplitude: 2.375 mV
Lead Channel Sensing Intrinsic Amplitude: 2.375 mV
Lead Channel Sensing Intrinsic Amplitude: 24.25 mV
Lead Channel Sensing Intrinsic Amplitude: 24.25 mV
Lead Channel Setting Pacing Amplitude: 2 V
Lead Channel Setting Pacing Amplitude: 2.5 V
Lead Channel Setting Pacing Pulse Width: 0.4 ms
Lead Channel Setting Sensing Sensitivity: 1.2 mV

## 2020-01-15 NOTE — Progress Notes (Signed)
Remote pacemaker transmission.   

## 2020-01-27 ENCOUNTER — Other Ambulatory Visit: Payer: Self-pay

## 2020-01-27 DIAGNOSIS — G4733 Obstructive sleep apnea (adult) (pediatric): Secondary | ICD-10-CM | POA: Diagnosis not present

## 2020-02-01 ENCOUNTER — Other Ambulatory Visit: Payer: Self-pay | Admitting: Nurse Practitioner

## 2020-02-01 DIAGNOSIS — F0390 Unspecified dementia without behavioral disturbance: Secondary | ICD-10-CM

## 2020-02-02 ENCOUNTER — Telehealth: Payer: Self-pay | Admitting: Pulmonary Disease

## 2020-02-02 DIAGNOSIS — G4733 Obstructive sleep apnea (adult) (pediatric): Secondary | ICD-10-CM | POA: Diagnosis not present

## 2020-02-02 NOTE — Telephone Encounter (Signed)
Call patient  Sleep study result  Date of study: 01/27/2020  Impression: Negative study for significant sleep disordered breathing No significant oxygen desaturations  Recommendation: Clinical follow-up of symptoms is appropriate  Home sleep studies may be negative and up to 25 to 30%, if concern for significant sleep disordered breathing remains, an in lab study may be appropriate.  Regular exercises, recommend 6 to 8 hours of sleep Follow-up as scheduled

## 2020-02-03 NOTE — Telephone Encounter (Signed)
Called and spoke with patient's daughter Jose Patel regarding results of home sleep study. She is listed on the release form.   Dr. Ander Slade has reviewed the home sleep test this test was negative for sleep apnea. Daughter verbalized understanding of results and follow up plan.  Marland Kitchen

## 2020-02-04 ENCOUNTER — Encounter: Payer: Self-pay | Admitting: Internal Medicine

## 2020-02-04 ENCOUNTER — Ambulatory Visit: Payer: Medicare HMO | Admitting: Internal Medicine

## 2020-02-04 ENCOUNTER — Other Ambulatory Visit: Payer: Self-pay

## 2020-02-04 VITALS — BP 116/62 | HR 79 | Ht 70.0 in

## 2020-02-04 DIAGNOSIS — G4733 Obstructive sleep apnea (adult) (pediatric): Secondary | ICD-10-CM | POA: Diagnosis not present

## 2020-02-04 DIAGNOSIS — I4819 Other persistent atrial fibrillation: Secondary | ICD-10-CM | POA: Diagnosis not present

## 2020-02-04 DIAGNOSIS — I495 Sick sinus syndrome: Secondary | ICD-10-CM | POA: Diagnosis not present

## 2020-02-04 DIAGNOSIS — I10 Essential (primary) hypertension: Secondary | ICD-10-CM

## 2020-02-04 DIAGNOSIS — I480 Paroxysmal atrial fibrillation: Secondary | ICD-10-CM

## 2020-02-04 DIAGNOSIS — D6869 Other thrombophilia: Secondary | ICD-10-CM

## 2020-02-04 DIAGNOSIS — R001 Bradycardia, unspecified: Secondary | ICD-10-CM | POA: Diagnosis not present

## 2020-02-04 DIAGNOSIS — I484 Atypical atrial flutter: Secondary | ICD-10-CM | POA: Diagnosis not present

## 2020-02-04 LAB — CUP PACEART INCLINIC DEVICE CHECK
Battery Remaining Longevity: 151 mo
Battery Voltage: 3.1 V
Brady Statistic AP VP Percent: 5.52 %
Brady Statistic AP VS Percent: 85.22 %
Brady Statistic AS VP Percent: 5.13 %
Brady Statistic AS VS Percent: 4.16 %
Brady Statistic RA Percent Paced: 88.12 %
Brady Statistic RV Percent Paced: 12.44 %
Date Time Interrogation Session: 20210707162750
Implantable Lead Implant Date: 20201207
Implantable Lead Implant Date: 20201207
Implantable Lead Location: 753859
Implantable Lead Location: 753860
Implantable Lead Model: 5076
Implantable Lead Model: 5076
Implantable Pulse Generator Implant Date: 20201207
Lead Channel Impedance Value: 380 Ohm
Lead Channel Impedance Value: 418 Ohm
Lead Channel Impedance Value: 494 Ohm
Lead Channel Impedance Value: 551 Ohm
Lead Channel Pacing Threshold Amplitude: 0.5 V
Lead Channel Pacing Threshold Amplitude: 1 V
Lead Channel Pacing Threshold Pulse Width: 0.4 ms
Lead Channel Pacing Threshold Pulse Width: 0.4 ms
Lead Channel Sensing Intrinsic Amplitude: 2 mV
Lead Channel Sensing Intrinsic Amplitude: 2 mV
Lead Channel Sensing Intrinsic Amplitude: 22.125 mV
Lead Channel Sensing Intrinsic Amplitude: 28.375 mV
Lead Channel Setting Pacing Amplitude: 2 V
Lead Channel Setting Pacing Amplitude: 2.5 V
Lead Channel Setting Pacing Pulse Width: 0.4 ms
Lead Channel Setting Sensing Sensitivity: 1.2 mV

## 2020-02-04 NOTE — Patient Instructions (Addendum)
Medication Instructions:  Your physician recommends that you continue on your current medications as directed. Please refer to the Current Medication list given to you today.  *If you need a refill on your cardiac medications before your next appointment, please call your pharmacy*  Lab Work: None ordered.  If you have labs (blood work) drawn today and your tests are completely normal, you will receive your results only by: Marland Kitchen MyChart Message (if you have MyChart) OR . A paper copy in the mail If you have any lab test that is abnormal or we need to change your treatment, we will call you to review the results.  Testing/Procedures: None ordered.  Follow-Up: At [facility], you and your health needs are our priority.  As part of our continuing mission to provide you with exceptional heart care, we have created designated Provider Care Teams.  These Care Teams include your primary Cardiologist (physician) and Advanced Practice Providers (APPs -  Physician Assistants and Nurse Practitioners) who all work together to provide you with the care you need, when you need it.  We recommend signing up for the patient portal called "MyChart".  Sign up information is provided on this After Visit Summary.  MyChart is used to connect with patients for Virtual Visits (Telemedicine).  Patients are able to view lab/test results, encounter notes, upcoming appointments, etc.  Non-urgent messages can be sent to your provider as well.   To learn more about what you can do with MyChart, go to [email or web address].    Your next appointment:   Your physician wants you to follow-up in: As needed with [facility]. You will receive a reminder letter in the mail two months in advance. If you don't receive a letter, please call our office to schedule the follow-up appointment.  Remote monitoring is used to monitor your Pacemaker from home. This monitoring reduces the number of office visits required to check your device  to one time per year. It allows [place] to keep an eye on the functioning of your device to ensure it is working properly. You are scheduled for a device check from home on 04/15/2020. You may send your transmission at any time that day. If you have a wireless device, the transmission will be sent automatically. After your physician reviews your transmission, you will receive a postcard with your next transmission date.  Other Instructions:

## 2020-02-04 NOTE — Progress Notes (Signed)
PCP: [name], NP Primary Cardiologist: Dr Sallyanne Kuster Primary EP: Dr Rayann Heman  Jose Patel is a 86 y.o. male who presents today for routine electrophysiology followup.  Since last being seen in our clinic, the patient reports doing reasonably well.  He doesn't sleep well and does not have energy.  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  The patients daughter suspects that he may also have some depression. Today, he denies symptoms of palpitations, chest pain, shortness of breath,  lower extremity edema, dizziness, presyncope, or syncope.  The patient is otherwise without complaint today.   Past Medical History:  Diagnosis Date  . A-fib ([place])   . Anticoagulated on Coumadin    Per records from [place]   . Aortic regurgitation   . Aortic stenosis   . CHF (congestive heart failure) ([place])   . Coronary artery disease   . CVA (cerebral vascular accident) ([place])    hx of CVA noted on CT from 02/19/18  . Diabetes mellitus, type 2 ([place])   . Gastro-esophageal reflux disease without esophagitis    Per records from previous provider, [facility]  . Heart attack ([place])   . History of CT scan of head 02/19/2018   Per records from previous provider, [facility]. Chronic changes small vessel disease  . History of ECG    03/26/14- Sinus Tachycardia @ 107 bmp, QRS 78 msec, QT 298 msec, QTc 361 msec. Per records from [place]  . History of Holter monitoring 11/08/2016   Per records from previous provider, [facility]  . Hypertension   . 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)    Per records from previous provider, [facility] and [facility]   . MI (myocardial infarction) [facility])    Per records from [place]   . Mitral regurgitation   . Mitral stenosis   . Other intervertebral disc degeneration, lumbar region    Per records from previous  provider, [facility]  . Radiculopathy, lumbar region    Per records from previous provider, [facility] and [facility]  . Sinus bradycardia    Per records from [place]   . Sleep apnea   . Transient ischemic attack    10 to 12 years ago  . Typical atrial flutter ([place])    Per records from [place]    Past Surgical History:  Procedure Laterality Date  . CARDIAC ELECTROPHYSIOLOGY STUDY AND ABLATION    . CARDIOVERSION N/A 12/26/2018   Procedure: CARDIOVERSION;  Surgeon: Pixie Casino, MD;  Location: [facility] ENDOSCOPY;  Service: Cardiovascular;  Laterality: N/A;  . CARDIOVERSION N/A 05/14/2019   Procedure: CARDIOVERSION;  Surgeon: [name], MD;  Location: [facility] ENDOSCOPY;  Service: Cardiovascular;  Laterality: N/A;  . CATARACT EXTRACTION    . Debridement to right arm with MRSA infection     4 years ago  . GALLBLADDER SURGERY     Per records from [place]   . PACEMAKER IMPLANT N/A 07/07/2019   Procedure: PACEMAKER IMPLANT;  Surgeon: [name], MD;  Location: [place] CV LAB;  Service: Cardiovascular;  Laterality: N/A;  . TEE WITHOUT CARDIOVERSION N/A 12/26/2018   Procedure: TRANSESOPHAGEAL ECHOCARDIOGRAM (TEE);  Surgeon: Pixie Casino, MD;  Location: [facility] ENDOSCOPY;  Service: Cardiovascular;  Laterality: N/A;    ROS- all systems are reviewed and negatives except as per HPI above  Current Outpatient Medications  Medication Sig Dispense Refill  . amiodarone (PACERONE) 200 MG tablet Take 1 tablet (200 mg total) by mouth daily. 60 tablet 0  . amLODipine (NORVASC) 10 MG tablet Take 1 tablet (10 mg total) by mouth daily. 90 tablet 1  . amoxicillin (AMOXIL) 500 MG capsule Take 2,000 mg by mouth See admin instructions. Take 2,000 mg by mouth one hour prior to dental appointment    . Cholecalciferol (VITAMIN D3) 50 MCG (2000 UT) TABS Take 2,000 Units by mouth daily with breakfast.    . gabapentin (NEURONTIN) 100 MG capsule Take  1 capsule (100 mg total) by mouth daily as needed. 90 capsule 1  . hydrALAZINE (APRESOLINE) 50 MG tablet Take 1 tablet (50 mg total) by mouth 2 (two) times daily. 180 tablet 1  . losartan (COZAAR) 100 MG tablet Take 1 tablet (100 mg total) by mouth daily. 90 tablet 1  . Magnesium 400 MG CAPS Take 1 tablet by mouth daily.    . Melatonin 5 MG TABS Take 5 mg by mouth at bedtime as needed (for sleep).     . memantine (NAMENDA) 10 MG tablet TAKE ONE TABLET BY MOUTH TWICE DAILY 180 tablet 1  . metoprolol succinate (TOPROL-XL) 25 MG 24 hr tablet Take 1 tablet (25 mg total) by mouth daily. 90 tablet 2  . Red Yeast Rice 600 MG CAPS Take 1,200 mg by mouth daily.     . rivaroxaban (XARELTO) 20 MG TABS tablet TAKE 1 TABLET (20 MG TOTAL) BY MOUTH DAILY WITH SUPPER. 90 tablet 1  . tamsulosin (FLOMAX) 0.4 MG CAPS capsule Take 1 capsule (0.4 mg total) by mouth daily. 90 capsule 1   Current Facility-Administered Medications  Medication Dose Route Frequency Provider Last Rate Last Admin  . sodium chloride flush (NS) 0.9 % injection 3 mL  3 mL Intravenous Q12H Croitoru, Mihai, MD        Physical Exam: Vitals:   02/04/20 1351  BP: 116/62  Pulse: 79  SpO2: 95%  Height: 5\' 10"  (1.778 m)    GEN- The patient is elderly appearing, alert and oriented x 3 today.   Head- normocephalic, atraumatic Eyes-  Sclera clear, conjunctiva pink Ears- hearing intact Oropharynx- clear Lungs- Clear to ausculation bilaterally, normal work of breathing Heart- Regular rate and rhythm, no murmurs, rubs or gallops, PMI not laterally displaced GI- soft, NT, ND, + BS Extremities- no clubbing, cyanosis, or edema  Wt Readings from Last 3 Encounters:  12/18/19 181 lb 12.8 oz (82.5 kg)  12/01/19 180 lb (81.6 kg)  11/07/19 187 lb (84.8 kg)    EKG tracing ordered today is personally reviewed and shows a paced, first degree AV block  Assessment and Plan:  1. Atrial fibrillation/ atrial flutter S/p ablation by Dr Remus Blake in  2016.  He does not have an underlying atrial rhythm (atrial depending) and has severe LA enlargement on prior echo. He did not tolerate tikosyn due to qt prolongation.  He is now on amiodarone.  Labs are reviewed.  He will need to have close follow-up to avoid toxicity with this medicine. chads2vasc score is 5.  He is on xarelto. afib burden by device interrogation reveals afib burden 2.7%.  Cardiac compass shows that his afib burden is much improved. Given advanced age and atriopathy, he is not a good candidate for repeat ablation.  Continue amiodarone long term.  2. Sinus bradycardia Normal pacemaker function today  3. HTN Stable No change required today  4. OSA  Complaint with CPAP  Follow-up with Dr Sallyanne Kuster as scheduled I will see as needed  Risks, benefits and potential toxicities for medications prescribed and/or refilled reviewed with patient today.   Thompson Grayer MD, [facility] 02/04/2020 2:15 PM

## 2020-02-16 ENCOUNTER — Encounter: Payer: Self-pay | Admitting: Nurse Practitioner

## 2020-02-16 DIAGNOSIS — I10 Essential (primary) hypertension: Secondary | ICD-10-CM

## 2020-02-16 DIAGNOSIS — N401 Enlarged prostate with lower urinary tract symptoms: Secondary | ICD-10-CM

## 2020-02-16 DIAGNOSIS — R350 Frequency of micturition: Secondary | ICD-10-CM

## 2020-02-16 DIAGNOSIS — I480 Paroxysmal atrial fibrillation: Secondary | ICD-10-CM

## 2020-02-16 MED ORDER — LOSARTAN POTASSIUM 100 MG PO TABS: 100.0000 mg | ORAL_TABLET | Freq: Every day | ORAL | 1 refills | Status: DC

## 2020-02-16 MED ORDER — METOPROLOL SUCCINATE ER 25 MG PO TB24: 25.0000 mg | ORAL_TABLET | Freq: Every day | ORAL | 1 refills | Status: DC

## 2020-02-16 MED ORDER — RIVAROXABAN 20 MG PO TABS: ORAL_TABLET | ORAL | 1 refills | Status: DC

## 2020-02-16 MED ORDER — TAMSULOSIN HCL 0.4 MG PO CAPS: 0.4000 mg | ORAL_CAPSULE | Freq: Every day | ORAL | 1 refills | Status: DC

## 2020-02-16 MED ORDER — AMIODARONE HCL 200 MG PO TABS: 200.0000 mg | ORAL_TABLET | Freq: Every day | ORAL | 1 refills | Status: DC

## 2020-02-16 MED ORDER — AMLODIPINE BESYLATE 10 MG PO TABS: 10.0000 mg | ORAL_TABLET | Freq: Every day | ORAL | 1 refills | Status: DC

## 2020-02-16 MED ORDER — HYDRALAZINE HCL 50 MG PO TABS: 50.0000 mg | ORAL_TABLET | Freq: Two times a day (BID) | ORAL | 1 refills | Status: DC

## 2020-04-08 ENCOUNTER — Other Ambulatory Visit: Payer: Self-pay

## 2020-04-08 ENCOUNTER — Encounter: Payer: Self-pay | Admitting: Internal Medicine

## 2020-04-08 ENCOUNTER — Ambulatory Visit (INDEPENDENT_AMBULATORY_CARE_PROVIDER_SITE_OTHER): Payer: Medicare HMO | Admitting: Internal Medicine

## 2020-04-08 VITALS — BP 140/82 | HR 63 | Temp 100.0°F | Ht 70.0 in | Wt 181.8 lb

## 2020-04-08 DIAGNOSIS — Z7189 Other specified counseling: Secondary | ICD-10-CM

## 2020-04-08 DIAGNOSIS — R6889 Other general symptoms and signs: Secondary | ICD-10-CM

## 2020-04-08 LAB — POCT INFLUENZA A/B
Influenza A, POC: NEGATIVE
Influenza B, POC: NEGATIVE

## 2020-04-08 NOTE — Patient Instructions (Addendum)
Flu test was negative. Please get a rapid covid test and then a covid PCR confirmatory test.  Stay isolated pending this information.  Try to hydrate and rest.

## 2020-04-08 NOTE — Progress Notes (Signed)
Location:  [facility] clinic Provider: Santana Gosdin L. Mariea Clonts, D.O., C.M.D.  Goals of Care:  Advanced Directives 04/08/2020  Does Patient Have a Medical Advance Directive? Yes  Type of Advance Directive Out of facility DNR (pink MOST or yellow form)  Does patient want to make changes to medical advance directive? No - Guardian declined  Copy of [place] in Chart? -  Would patient like information on creating a medical advance directive? -     Chief Complaint  Patient presents with  . Acute Visit    possible flu and or covid systoms    HPI: Patient is a 86 y.o. male pt of Jessica's with h/o sick sinus syndrome/tachy-brady, afib/flutter, htn, aortic regurg, OSA, DMII, vitamin D deficiency, hypercoagulable state due to afib, diastolic htn seen today for an acute visit for weakness, fatigue, rhinitis, cough and fell this am.   He is the caregiver for his wife who has dementia.    He has a temp of 100 here today.    He reports constipation.  His daughter is addressing this part.  She is a Marine scientist.    He began c/o fatigue since yesterday.  Could not get out of bed, felt dead weight, this am hs daughter had to help him up.  A little cough.  Rhinorrhea like a freight train   He's cold sitting in here.  Slight headache.  Muscle aches from getting in and out of bed.    No dysuria.  Did have a little leakage.  That was with excitement from his wife she she fell in the yard.    He has no appetite.  He spit up chicken broth.  He does not like the electrolyte water.  He's still urinating.    His daughter will get him a rapid test and take him to [facility] to get the PCR b/c we did not get the order placed by our [facility] phlebotomist for the covid tests after ours expired.  This was discovered after his appt was made.     Past Medical History:  Diagnosis Date  . A-fib ([place])   . Anticoagulated on Coumadin    Per records from [place]   . Aortic regurgitation   . Aortic stenosis   . CHF  (congestive heart failure) ([place])   . Coronary artery disease   . CVA (cerebral vascular accident) ([place])    hx of CVA noted on CT from 02/19/18  . Diabetes mellitus, type 2 ([place])   . Gastro-esophageal reflux disease without esophagitis    Per records from previous provider, [facility]  . Heart attack ([place])   . History of CT scan of head 02/19/2018   Per records from previous provider, [facility]. Chronic changes small vessel disease  . History of ECG    03/26/14- Sinus Tachycardia @ 107 bmp, QRS 78 msec, QT 298 msec, QTc 361 msec. Per records from [place]  . History of Holter monitoring 11/08/2016   Per records from previous provider, [facility]  . Hypertension   . 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 MI (myocardial infarction) [facility])    Per records from [place]   . Mitral regurgitation   . Mitral stenosis   . Other intervertebral disc degeneration, lumbar region    Per records from previous  provider, [facility]  . Radiculopathy, lumbar region    Per records from previous provider, [facility] and [facility]  . Sinus bradycardia    Per records from [place]   . Sleep apnea   . Transient ischemic attack    10 to 12 years ago  . Typical atrial flutter ([place])    Per records from [place]     Past Surgical History:  Procedure Laterality Date  . CARDIAC ELECTROPHYSIOLOGY STUDY AND ABLATION    . CARDIOVERSION N/A 12/26/2018   Procedure: CARDIOVERSION;  Surgeon: Pixie Casino, MD;  Location: [facility] ENDOSCOPY;  Service: Cardiovascular;  Laterality: N/A;  . CARDIOVERSION N/A 05/14/2019   Procedure: CARDIOVERSION;  Surgeon: [name], MD;  Location: [facility] ENDOSCOPY;  Service: Cardiovascular;  Laterality: N/A;  . CATARACT  EXTRACTION    . Debridement to right arm with MRSA infection     4 years ago  . GALLBLADDER SURGERY     Per records from [place]   . PACEMAKER IMPLANT N/A 07/07/2019   Procedure: PACEMAKER IMPLANT;  Surgeon: [name], MD;  Location: [place] CV LAB;  Service: Cardiovascular;  Laterality: N/A;  . TEE WITHOUT CARDIOVERSION N/A 12/26/2018   Procedure: TRANSESOPHAGEAL ECHOCARDIOGRAM (TEE);  Surgeon: Pixie Casino, MD;  Location: [facility] ENDOSCOPY;  Service: Cardiovascular;  Laterality: N/A;    Allergies  Allergen Reactions  . Aricept [Donepezil] Diarrhea, Nausea And Vomiting and Other (See Comments)    Cannot tolerate- bradycardia, also  . Tropicamide Nausea And Vomiting    Used to dilate eyes (might be this- was switched to something this was tolerable, after this)  . Adhesive [Tape] Rash    Outpatient Encounter Medications as of 04/08/2020  Medication Sig  . amiodarone (PACERONE) 200 MG tablet Take 1 tablet (200 mg total) by mouth daily.  Marland Kitchen amLODipine (NORVASC) 10 MG tablet Take 1 tablet (10 mg total) by mouth daily.  Marland Kitchen amoxicillin (AMOXIL) 500 MG capsule Take 2,000 mg by mouth See admin instructions. Take 2,000 mg by mouth one hour prior to dental appointment  . Cholecalciferol (VITAMIN D3) 50 MCG (2000 UT) TABS Take 2,000 Units by mouth daily with breakfast.  . gabapentin (NEURONTIN) 100 MG capsule Take 1 capsule (100 mg total) by mouth daily as needed.  . hydrALAZINE (APRESOLINE) 50 MG tablet Take 1 tablet (50 mg total) by mouth 2 (two) times daily.  Marland Kitchen losartan (COZAAR) 100 MG tablet Take 1 tablet (100 mg total) by mouth daily.  . Magnesium 400 MG CAPS Take 1 tablet by mouth daily.  . Melatonin 5 MG TABS Take 5 mg by mouth at bedtime as needed (for sleep).   . memantine (NAMENDA) 10 MG tablet TAKE ONE TABLET BY MOUTH TWICE DAILY  . metoprolol succinate (TOPROL-XL) 25 MG 24 hr tablet Take 1 tablet (25 mg total) by mouth daily.  . rivaroxaban (XARELTO) 20 MG TABS tablet TAKE  1 TABLET (20 MG TOTAL) BY MOUTH DAILY WITH SUPPER.  . tamsulosin (FLOMAX) 0.4 MG CAPS capsule Take 1 capsule (0.4 mg total) by mouth daily.  . [DISCONTINUED] Red Yeast Rice 600 MG CAPS Take 1,200 mg by mouth daily.    Facility-Administered Encounter Medications as of 04/08/2020  Medication  . sodium chloride flush (NS) 0.9 % injection 3 mL    Review of Systems:  Review of Systems  Constitutional: Positive for chills, fever and malaise/fatigue.  HENT: Positive for congestion.   Respiratory: Positive for cough. Negative for sputum  production and shortness of breath.   Cardiovascular: Negative for chest pain, palpitations and leg swelling.  Gastrointestinal: Positive for nausea and vomiting. Negative for abdominal pain, blood in stool, diarrhea and melena.  Genitourinary: Negative for dysuria and urgency.  Musculoskeletal: Positive for falls and myalgias.  Neurological: Positive for weakness. Negative for loss of consciousness.    Health Maintenance  Topic Date Due  . OPHTHALMOLOGY EXAM  Never done  . COVID-19 Vaccine (1) Never done  . INFLUENZA VACCINE  02/29/2020  . HEMOGLOBIN A1C  04/16/2020  . FOOT EXAM  07/17/2020  . PNA vac Low Risk Adult (2 of 2 - PCV13) 07/20/2020  . TETANUS/TDAP  08/01/2023    Physical Exam: Vitals:   04/08/20 1416  BP: 140/82  Pulse: 63  Temp: 100 F (37.8 C)  TempSrc: Temporal  SpO2: 97%  Weight: 181 lb 12.8 oz (82.5 kg)  Height: 5\' 10"  (1.778 m)   Body mass index is 26.09 kg/m. Physical Exam Vitals reviewed.  Constitutional:      General: He is not in acute distress.    Appearance: He is ill-appearing. He is not toxic-appearing.     Comments: Slid down in wheelchair, less talkative (vs visit for wife recently when he walked in and contributed quite a bit to history)  HENT:     Head: Normocephalic and atraumatic.     Nose: Rhinorrhea present.  Cardiovascular:     Rate and Rhythm: Normal rate and regular rhythm.     Pulses: Normal pulses.      Heart sounds: Normal heart sounds.  Pulmonary:     Effort: Pulmonary effort is normal.     Breath sounds: Normal breath sounds. No wheezing, rhonchi or rales.  Abdominal:     General: Bowel sounds are normal.     Tenderness: There is no abdominal tenderness.  Musculoskeletal:        General: Normal range of motion.     Right lower leg: No edema.     Left lower leg: No edema.  Lymphadenopathy:     Cervical: No cervical adenopathy.  Skin:    Coloration: Skin is pale.  Neurological:     Mental Status: He is alert. Mental status is at baseline.     Motor: Weakness present.     Gait: Gait abnormal.     Comments: Brought in wheelchair into exam room  Psychiatric:        Mood and Affect: Mood normal.     Labs reviewed: Basic Metabolic Panel: Recent Labs    05/14/19 1722 05/14/19 1722 05/15/19 0801 05/15/19 0801 05/16/19 0218 07/04/19 1510 10/15/19 1445  NA 140   < > 139   < > 137 142 142  K 4.1   < > 3.8   < > 4.3 4.7 4.7  CL 102   < > 105   < > 109 105 108  CO2 26   < > 26   < > 22 23 29   GLUCOSE 166*   < > 118*   < > 100* 98 93  BUN 23   < > 36*   < > 34* 24 32*  CREATININE 1.27*   < > 1.43*   < > 1.28* 1.19 1.40*  CALCIUM 8.9   < > 8.8*   < > 7.8* 8.4* 9.1  MG 2.0  --  2.5*  --  2.2  --   --    < > = values in this interval not displayed.  Liver Function Tests: Recent Labs    05/13/19 1503 10/15/19 1445  AST 15 26  ALT 14 40  ALKPHOS 77  --   BILITOT 1.0 0.4  PROT 7.0 6.9  ALBUMIN 3.6  --    No results for input(s): LIPASE, AMYLASE in the last 8760 hours. No results for input(s): AMMONIA in the last 8760 hours. CBC: Recent Labs    05/13/19 1503 07/04/19 1510 10/15/19 1445  WBC 6.7 6.5 6.7  NEUTROABS  --   --  3,832  HGB 14.4 13.2 14.3  HCT 44.9 40.8 43.7  MCV 97.6 97 95.4  PLT 239 200 219   Lipid Panel: Recent Labs    10/15/19 1445  CHOL 187  HDL 41  LDLCALC 120*  TRIG 138  CHOLHDL 4.6   Lab Results  Component Value Date   HGBA1C  5.8 (H) 10/15/2019    Procedures since last visit: No results found.  Assessment/Plan 1. Flu-like symptoms - POC Influenza A/B was negative -we could not do covid test b/c we did not have one after there full protocol was otherwise implemented -his daughter was arranging for this to get done and will keep [place] posted  2. Counseled about COVID-19 virus infection -counseled about isolating, distancing, masking -suspect he may have another virus that the grandchild and great grand recently had, but need to r/o covid first  -encouraged hydration, electrolyte drinks , ice chips, gradually more liquids and then bland foods, rest  Labs/tests ordered:  Flu swab negative, covid swab to be done asap  Next appt:  04/16/2020  Janecia Palau L. Brookelynne Dimperio, D.O. [facility] [address], [place] [address] Cell Phone (Mon-Fri 8am-5pm):  [phone] On Call:  [phone] & follow prompts after 5pm & weekends Office Phone:  [phone] Office Fax:  [phone]

## 2020-04-09 ENCOUNTER — Other Ambulatory Visit: Payer: Medicare HMO

## 2020-04-09 ENCOUNTER — Other Ambulatory Visit: Payer: Self-pay

## 2020-04-09 DIAGNOSIS — Z20822 Contact with and (suspected) exposure to covid-19: Secondary | ICD-10-CM | POA: Diagnosis not present

## 2020-04-12 LAB — NOVEL CORONAVIRUS, NAA: SARS-CoV-2, NAA: DETECTED — AB

## 2020-04-13 ENCOUNTER — Other Ambulatory Visit: Payer: Self-pay | Admitting: Physician Assistant

## 2020-04-13 DIAGNOSIS — I5030 Unspecified diastolic (congestive) heart failure: Secondary | ICD-10-CM

## 2020-04-13 DIAGNOSIS — U071 COVID-19: Secondary | ICD-10-CM

## 2020-04-13 DIAGNOSIS — Z950 Presence of cardiac pacemaker: Secondary | ICD-10-CM

## 2020-04-13 DIAGNOSIS — E119 Type 2 diabetes mellitus without complications: Secondary | ICD-10-CM

## 2020-04-13 DIAGNOSIS — I10 Essential (primary) hypertension: Secondary | ICD-10-CM

## 2020-04-13 NOTE — Progress Notes (Signed)
I connected by phone with Jose Patel on 04/13/2020 at 12:33 PM to discuss the potential use of a new treatment for mild to moderate COVID-19 viral infection in non-hospitalized patients.  This patient is a 86 y.o. male that meets the FDA criteria for Emergency Use Authorization of COVID monoclonal antibody casirivimab/imdevimab.  Has a (+) direct SARS-CoV-2 viral test result  Has mild or moderate COVID-19   Is NOT hospitalized due to COVID-19  Is within 10 days of symptom onset  Has at least one of the high risk factor(s) for progression to severe COVID-19 and/or hospitalization as defined in EUA.  Specific high risk criteria : Older age (>/= 84 yo) and Cardiovascular disease or hypertension   I have spoken and communicated the following to the patient or parent/caregiver regarding COVID monoclonal antibody treatment:  1. FDA has authorized the emergency use for the treatment of mild to moderate COVID-19 in adults and pediatric patients with positive results of direct SARS-CoV-2 viral testing who are 2 years of age and older weighing at least 40 kg, and who are at high risk for progressing to severe COVID-19 and/or hospitalization.  2. The significant known and potential risks and benefits of COVID monoclonal antibody, and the extent to which such potential risks and benefits are unknown.  3. Information on available alternative treatments and the risks and benefits of those alternatives, including clinical trials.  4. Patients treated with COVID monoclonal antibody should continue to self-isolate and use infection control measures (e.g., wear mask, isolate, social distance, avoid sharing personal items, clean and disinfect "high touch" surfaces, and frequent handwashing) according to CDC guidelines.   5. The patient or parent/caregiver has the option to accept or refuse COVID monoclonal antibody treatment.  After reviewing this information with the patient, The patient agreed to  proceed with receiving casirivimab\imdevimab infusion and will be provided a copy of the Fact sheet prior to receiving the infusion.   [name] 04/13/2020 12:33 PM

## 2020-04-14 ENCOUNTER — Ambulatory Visit (HOSPITAL_COMMUNITY)
Admission: RE | Admit: 2020-04-14 | Discharge: 2020-04-14 | Disposition: A | Discharge status: Home / Self Care | Payer: Medicare Other | Source: Ambulatory Visit | Attending: Pulmonary Disease | Admitting: Pulmonary Disease

## 2020-04-14 DIAGNOSIS — Z23 Encounter for immunization: Secondary | ICD-10-CM | POA: Diagnosis not present

## 2020-04-14 DIAGNOSIS — U071 COVID-19: Secondary | ICD-10-CM

## 2020-04-14 MED ORDER — SODIUM CHLORIDE 0.9 % IV SOLN: INTRAVENOUS | Status: DC | PRN

## 2020-04-14 MED ORDER — EPINEPHRINE 0.3 MG/0.3ML IJ SOAJ: 0.3000 mg | Freq: Once | INTRAMUSCULAR | Status: DC | PRN

## 2020-04-14 MED ORDER — DIPHENHYDRAMINE HCL 50 MG/ML IJ SOLN: 50.0000 mg | Freq: Once | INTRAMUSCULAR | Status: DC | PRN

## 2020-04-14 MED ORDER — ALBUTEROL SULFATE HFA 108 (90 BASE) MCG/ACT IN AERS: 2.0000 | INHALATION_SPRAY | Freq: Once | RESPIRATORY_TRACT | Status: DC | PRN

## 2020-04-14 MED ORDER — FAMOTIDINE IN NACL 20-0.9 MG/50ML-% IV SOLN: 20.0000 mg | Freq: Once | INTRAVENOUS | Status: DC | PRN

## 2020-04-14 MED ORDER — METHYLPREDNISOLONE SODIUM SUCC 125 MG IJ SOLR: 125.0000 mg | Freq: Once | INTRAMUSCULAR | Status: DC | PRN

## 2020-04-14 MED ORDER — SODIUM CHLORIDE 0.9 % IV SOLN
1200.0000 mg | Freq: Once | INTRAVENOUS | Status: AC
  Administered 2020-04-14: 1200 mg via INTRAVENOUS

## 2020-04-14 NOTE — Progress Notes (Signed)
  Diagnosis: COVID-19  Physician:Dr Wright  Procedure: Covid Infusion Clinic Med: casirivimab\imdevimab infusion - Provided patient with casirivimab\imdevimab fact sheet for patients, parents and caregivers prior to infusion.  Complications: No immediate complications noted.  Discharge: Discharged home   Jose Patel 04/14/2020  

## 2020-04-15 ENCOUNTER — Ambulatory Visit (INDEPENDENT_AMBULATORY_CARE_PROVIDER_SITE_OTHER): Payer: Medicare HMO | Admitting: *Deleted

## 2020-04-15 DIAGNOSIS — I495 Sick sinus syndrome: Secondary | ICD-10-CM | POA: Diagnosis not present

## 2020-04-16 ENCOUNTER — Ambulatory Visit: Payer: Medicare HMO | Admitting: Nurse Practitioner

## 2020-04-19 LAB — CUP PACEART REMOTE DEVICE CHECK
Battery Remaining Longevity: 153 mo
Battery Voltage: 3.07 V
Brady Statistic AP VP Percent: 0.4 %
Brady Statistic AP VS Percent: 98.97 %
Brady Statistic AS VP Percent: 0.13 %
Brady Statistic AS VS Percent: 0.5 %
Brady Statistic RA Percent Paced: 99 %
Brady Statistic RV Percent Paced: 0.72 %
Date Time Interrogation Session: 20210919151950
Implantable Lead Implant Date: 20201207
Implantable Lead Implant Date: 20201207
Implantable Lead Location: 753859
Implantable Lead Location: 753860
Implantable Lead Model: 5076
Implantable Lead Model: 5076
Implantable Pulse Generator Implant Date: 20201207
Lead Channel Impedance Value: 304 Ohm
Lead Channel Impedance Value: 399 Ohm
Lead Channel Impedance Value: 475 Ohm
Lead Channel Impedance Value: 475 Ohm
Lead Channel Pacing Threshold Amplitude: 0.625 V
Lead Channel Pacing Threshold Amplitude: 1.125 V
Lead Channel Pacing Threshold Pulse Width: 0.4 ms
Lead Channel Pacing Threshold Pulse Width: 0.4 ms
Lead Channel Sensing Intrinsic Amplitude: 0.875 mV
Lead Channel Sensing Intrinsic Amplitude: 0.875 mV
Lead Channel Sensing Intrinsic Amplitude: 23.125 mV
Lead Channel Sensing Intrinsic Amplitude: 23.125 mV
Lead Channel Setting Pacing Amplitude: 1.5 V
Lead Channel Setting Pacing Amplitude: 2.5 V
Lead Channel Setting Pacing Pulse Width: 0.4 ms
Lead Channel Setting Sensing Sensitivity: 1.2 mV

## 2020-04-20 NOTE — Progress Notes (Signed)
Remote pacemaker transmission.   

## 2020-05-12 DIAGNOSIS — S0501XA Injury of conjunctiva and corneal abrasion without foreign body, right eye, initial encounter: Secondary | ICD-10-CM | POA: Diagnosis not present

## 2020-05-12 DIAGNOSIS — H35372 Puckering of macula, left eye: Secondary | ICD-10-CM | POA: Diagnosis not present

## 2020-05-12 DIAGNOSIS — Z961 Presence of intraocular lens: Secondary | ICD-10-CM | POA: Diagnosis not present

## 2020-06-16 DIAGNOSIS — R69 Illness, unspecified: Secondary | ICD-10-CM | POA: Diagnosis not present

## 2020-06-21 ENCOUNTER — Telehealth: Payer: Self-pay | Admitting: Cardiovascular Disease

## 2020-06-21 ENCOUNTER — Other Ambulatory Visit: Payer: Self-pay | Admitting: *Deleted

## 2020-06-21 DIAGNOSIS — I10 Essential (primary) hypertension: Secondary | ICD-10-CM

## 2020-06-21 DIAGNOSIS — Z950 Presence of cardiac pacemaker: Secondary | ICD-10-CM

## 2020-06-21 DIAGNOSIS — I495 Sick sinus syndrome: Secondary | ICD-10-CM

## 2020-06-21 NOTE — Telephone Encounter (Signed)
Left message for pt to call and also for patient daughter to call

## 2020-06-21 NOTE — Telephone Encounter (Signed)
See patient advice request message.

## 2020-06-21 NOTE — Telephone Encounter (Signed)
STAT if HR is under 50 or over 120 (normal HR is 60-100 beats per minute)  1) What is your heart rate? Not currently with the patient  2) Do you have a log of your heart rate readings (document readings)? Last few days has not been below 100, ranging between 104 and 114  3) Do you have any other symptoms? Dizziness, fatigue   Patient's daughter states for the past few days the patient's HR has been very high and he has been feeling dizzy and fatigued. She states Saturday his BP was very low at 81/60 so she held his BP medication and he will start taking it again today. She states she is at work and currently not with the patient.

## 2020-06-22 DIAGNOSIS — I495 Sick sinus syndrome: Secondary | ICD-10-CM | POA: Diagnosis not present

## 2020-06-22 DIAGNOSIS — I10 Essential (primary) hypertension: Secondary | ICD-10-CM | POA: Diagnosis not present

## 2020-06-22 DIAGNOSIS — Z950 Presence of cardiac pacemaker: Secondary | ICD-10-CM | POA: Diagnosis not present

## 2020-06-22 NOTE — Progress Notes (Signed)
Cardiology Clinic Note   Patient Name: Jose Patel Date of Encounter: 06/23/2020  Primary Care Provider:  [name], NP Primary Cardiologist:  [name], MD  Patient Profile    Jose Patel 86 year old male presents the clinic today for an evaluation of his shortness of breath and tachycardia.  Past Medical History    Past Medical History:  Diagnosis Date  . A-fib ([place])   . Anticoagulated on Coumadin    Per records from [place]   . Aortic regurgitation   . Aortic stenosis   . CHF (congestive heart failure) ([place])   . Coronary artery disease   . CVA (cerebral vascular accident) ([place])    hx of CVA noted on CT from 02/19/18  . Diabetes mellitus, type 2 ([place])   . Gastro-esophageal reflux disease without esophagitis    Per records from previous provider, [facility]  . Heart attack ([place])   . History of CT scan of head 02/19/2018   Per records from previous provider, [facility]. Chronic changes small vessel disease  . History of ECG    03/26/14- Sinus Tachycardia @ 107 bmp, QRS 78 msec, QT 298 msec, QTc 361 msec. Per records from [place]  . History of Holter monitoring 11/08/2016   Per records from previous provider, [facility]  . Hypertension   . 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 MI (myocardial infarction) [facility])    Per records from [place]   . Mitral regurgitation   . Mitral stenosis   . Other intervertebral disc degeneration, lumbar region    Per records from previous provider, [facility]  . Radiculopathy, lumbar region    Per records from previous provider, [facility] and [facility]  . Sinus bradycardia    Per records from [place]   . Sleep apnea   . Transient ischemic attack    10 to  12 years ago  . Typical atrial flutter ([place])    Per records from [place]    Past Surgical History:  Procedure Laterality Date  . CARDIAC ELECTROPHYSIOLOGY STUDY AND ABLATION    . CARDIOVERSION N/A 12/26/2018   Procedure: CARDIOVERSION;  Surgeon: Pixie Casino, MD;  Location: [facility] ENDOSCOPY;  Service: Cardiovascular;  Laterality: N/A;  . CARDIOVERSION N/A 05/14/2019   Procedure: CARDIOVERSION;  Surgeon: [name], MD;  Location: [facility] ENDOSCOPY;  Service: Cardiovascular;  Laterality: N/A;  . CATARACT EXTRACTION    . Debridement to right arm with MRSA infection     4 years ago  . GALLBLADDER SURGERY     Per records from [place]   . PACEMAKER IMPLANT N/A 07/07/2019   Procedure: PACEMAKER IMPLANT;  Surgeon: [name], MD;  Location: [place] CV LAB;  Service: Cardiovascular;  Laterality: N/A;  . TEE WITHOUT CARDIOVERSION N/A 12/26/2018   Procedure: TRANSESOPHAGEAL ECHOCARDIOGRAM (TEE);  Surgeon: Pixie Casino, MD;  Location: [facility] ENDOSCOPY;  Service: Cardiovascular;  Laterality: N/A;    Allergies  Allergies  Allergen Reactions  . Aricept [Donepezil] Diarrhea, Nausea And Vomiting and Other (See Comments)    Cannot tolerate- bradycardia, also  . Tropicamide Nausea And Vomiting    Used to dilate eyes (might be this- was switched to something this was tolerable, after this)  . Adhesive [Tape] Rash  History of Present Illness    [name] has a PMH of essential hypertension, paroxysmal atrial fibrillation, CAD, CHF, aortic valve regurgitation, sinus bradycardia, mild dilation of the ascending aorta, sick sinus syndrome status post PPM, tachybradycardia syndrome, OSA, DM type II, fatigue.  He contracted COVID-19 9/21.  He received monoclonal antibody infusion 04/14/2020.  He contacted nurse triage line on 06/22/2020 and indicated that he had increased shortness of breath and tachycardia.  He also indicated that he had orthopnea and was sleeping in her recliner.  He  denied lower extremity edema and had not been weighing.  He is seen in the clinic today and states over the last 2 weeks he has noticed increased heart rate.  He has had less energy and some fatigue with his increased heart rate.  His daughter accompanies him today and states that she has noticed increasing shortness of breath with light physical activity.  When asked about p.o. hydration she states that he does not drink as much fluid as he should.  She feels that he may still be recovering from his COVID-19 infection.  He he received a monoclonal antibody infusion after he had been infected more than 7 days.  She then also contracted the virus but was not severely ill.  I will increase his metoprolol to 50 mg daily, increase his p.o. fluid consumption, repeat an echocardiogram and have him follow-up as scheduled with Dr. Loletha Grayer.  Today he denies chest pain,  lower extremity edema, fatigue, palpitations, melena, hematuria, hemoptysis, diaphoresis, weakness, presyncope, syncope, orthopnea, and PND.   Home Medications    Prior to Admission medications   Medication Sig Start Date End Date Taking? Authorizing Provider  amiodarone (PACERONE) 200 MG tablet Take 1 tablet (200 mg total) by mouth daily. 02/16/20   [name], NP  amLODipine (NORVASC) 10 MG tablet Take 1 tablet (10 mg total) by mouth daily. 02/16/20   [name], NP  amoxicillin (AMOXIL) 500 MG capsule Take 2,000 mg by mouth See admin instructions. Take 2,000 mg by mouth one hour prior to dental appointment 04/17/19   [provider]  Cholecalciferol (VITAMIN D3) 50 MCG (2000 UT) TABS Take 2,000 Units by mouth daily with breakfast.    [provider]  gabapentin (NEURONTIN) 100 MG capsule Take 1 capsule (100 mg total) by mouth daily as needed. 08/11/19   [name], NP  hydrALAZINE (APRESOLINE) 50 MG tablet Take 1 tablet (50 mg total) by mouth 2 (two) times daily. 02/16/20   [name], NP  losartan  (COZAAR) 100 MG tablet Take 1 tablet (100 mg total) by mouth daily. 02/16/20   [name], NP  Magnesium 400 MG CAPS Take 1 tablet by mouth daily.    [provider]  Melatonin 5 MG TABS Take 5 mg by mouth at bedtime as needed (for sleep).     [provider]  memantine (NAMENDA) 10 MG tablet TAKE ONE TABLET BY MOUTH TWICE DAILY 02/03/20   [name], NP  metoprolol succinate (TOPROL-XL) 25 MG 24 hr tablet Take 1 tablet (25 mg total) by mouth daily. 02/16/20   [name], NP  rivaroxaban (XARELTO) 20 MG TABS tablet TAKE 1 TABLET (20 MG TOTAL) BY MOUTH DAILY WITH SUPPER. 02/16/20   [name], NP  tamsulosin (FLOMAX) 0.4 MG CAPS capsule Take 1 capsule (0.4 mg total) by mouth daily. 02/16/20   [name], NP    Family History    Family  History  Problem Relation Age of Onset  . Heart failure Mother   . Hyperlipidemia Mother   . Hypertension Son   . Hyperlipidemia Son   . Arthritis Son   . Arthritis Daughter   . Hypertension Son    He indicated that his mother is deceased. He indicated that his father is deceased. He indicated that his daughter is alive. He indicated that both of his sons are alive.  Social History    Social History   Socioeconomic History  . Marital status: Married    Spouse name: Not on file  . Number of children: Not on file  . Years of education: Not on file  . Highest education level: Not on file  Occupational History  . Not on file  Tobacco Use  . Smoking status: Former Smoker    Years: 1.00    Types: Cigarettes, Cigars    Quit date: 07/31/1953    Years since quitting: 66.9  . Smokeless tobacco: Never Used  . Tobacco comment: Quit at age 33  Vaping Use  . Vaping Use: Never used  Substance and Sexual Activity  . Alcohol use: Never  . Drug use: Never  . Sexual activity: Not Currently  Other Topics Concern  . Not on file  Social History Narrative   Social History      Diet?       Do you drink/eat  things with caffeine? yes      Marital status?          married                          What year were you married? 1957      Do you live in a house, apartment, assisted living, condo, trailer, etc.? home      Is it one or more stories? 1      How many persons live in your home? 4      Do you have any pets in your home? (please list) yes- 3 dogs, 1 cat      Highest level of education completed? 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           no                           Type & how often?      Advanced Directives      Do you have a living will? yes      Do you have a DNR form?                                  If not, do you want to discuss one? no      Do you have signed POA/HPOA for forms? yes      Functional Status      Do you have difficulty bathing or dressing yourself? no      Do you have difficulty preparing food or eating? no      Do you have difficulty managing your medications? no      Do you have difficulty managing your finances? no      Do you have difficulty affording your medications? Yes xarelto   Social Determinants of Health  Financial Resource Strain:   . Difficulty of Paying Living Expenses: Not on file  Food Insecurity:   . Worried About Charity fundraiser in the Last Year: Not on file  . Ran Out of Food in the Last Year: Not on file  Transportation Needs:   . Lack of Transportation (Medical): Not on file  . Lack of Transportation (Non-Medical): Not on file  Physical Activity:   . Days of Exercise per Week: Not on file  . Minutes of Exercise per Session: Not on file  Stress:   . Feeling of Stress : Not on file  Social Connections:   . Frequency of Communication with Friends and Family: Not on file  . Frequency of Social Gatherings with Friends and Family: Not on file  . Attends Religious Services: Not on file  . Active Member of Clubs or Organizations: Not on file  . Attends  Archivist Meetings: Not on file  . Marital Status: Not on file  Intimate Partner Violence:   . Fear of Current or Ex-Partner: Not on file  . Emotionally Abused: Not on file  . Physically Abused: Not on file  . Sexually Abused: Not on file     Review of Systems    General:  No chills, fever, night sweats or weight changes.  Cardiovascular:  No chest pain, dyspnea on exertion, edema, orthopnea, palpitations, paroxysmal nocturnal dyspnea. Dermatological: No rash, lesions/masses Respiratory: No cough, dyspnea Urologic: No hematuria, dysuria Abdominal:   No nausea, vomiting, diarrhea, bright red blood per rectum, melena, or hematemesis Neurologic:  No visual changes, wkns, changes in mental status. All other systems reviewed and are otherwise negative except as noted above.  Physical Exam    VS:  BP 122/70 (BP Location: Left Arm, Patient Position: Sitting)   Pulse (!) 110   Ht 5\' 10"  (1.778 m)   Wt 182 lb (82.6 kg)   SpO2 97%   BMI 26.11 kg/m  , BMI Body mass index is 26.11 kg/m. GEN: Well nourished, well developed, in no acute distress. HEENT: normal. Neck: Supple, no JVD, carotid bruits, or masses. Cardiac: Sinus tachycardia, no murmurs, rubs, or gallops. No clubbing, cyanosis, edema.  Radials/DP/PT 1+ and equal bilaterally.  Respiratory:  Respirations regular and unlabored, clear to auscultation bilaterally. GI: Soft, nontender, nondistended, BS + x 4. MS: no deformity or atrophy. Skin: warm and dry, no rash. Neuro:  Strength and sensation are intact. Psych: Normal affect.  Accessory Clinical Findings    Recent Labs: 06/22/2020: ALT 17; BUN 30; Creatinine, Ser 1.30; Hemoglobin 13.6; Platelets 193; Potassium 4.1; Sodium 144   Recent Lipid Panel    Component Value Date/Time   CHOL 187 10/15/2019 1445   TRIG 138 10/15/2019 1445   HDL 41 10/15/2019 1445   CHOLHDL 4.6 10/15/2019 1445   LDLCALC 120 (H) 10/15/2019 1445    ECG personally reviewed by me  today-sinus tachycardia with first-degree AV block ST in marked T wave abnormality consider anterior lateral ischemia 110 bpm  Echocardiogram TEE 12/26/2018 IMPRESSIONS    1. The left ventricle has normal systolic function, with an ejection  fraction of 60-65%. There is mildly increased left ventricular wall  thickness. No evidence of left ventricular regional wall motion  abnormalities.  2. The right ventricle has normal systolc function. The cavity was  normal. There is no increase in right ventricular wall thickness.  3. Left atrial size was moderately dilated.  4. Right atrial size was mildly dilated.  5. The mitral  valve is abnormal. Mild thickening of the mitral valve  leaflet.  6. The tricuspid valve was grossly normal.  7. The aortic valve is tricuspid Mild sclerosis of the aortic valve.  Aortic valve regurgitation is moderate by color flow Doppler.  8. There is moderate dilatation of the ascending aorta measuring 44 mm.  Assessment & Plan   1.  DOE/fatigue -has noticed progressive DOE since COVID-19 infection 9/21.  He received monoclonal antibody infusion 04/14/2020.  Feel this is multifactorial in nature related to his COVID-19 infection, dehydration, and deconditioning. Repeat echocardiogram Increase physical activity as tolerated Increase p.o. hydration  Tachycardia/A. fib/PPM -EKG today shows sinus tachycardia with first-degree AV block 110 bpm.  He is status post ablation by Dr. Remus Blake 2016.  Underlying atrial rhythm noted by Dr. Rayann Heman 02/04/2020.  Did not tolerate Tikosyn due to QT prolongation.  CHA2DS2-VASc score 5.  Felt to not be a good candidate for repeat ablation procedure.  Medical management with long-term amiodarone was felt to be the best course of treatment.  Last device check 04/19/2020 showed patient not pacemaker dependent, good battery status, stable leads, favorable histogram, low paroxysmal A. fib burden 0.3%, well rate controlled. Continue  amiodarone, Xarelto Increase metoprolol to 50 mg daily  Essential hypertension-BP today 122/70.  Well-controlled at home Continue amlodipine, hydralazine, losartan, Heart healthy low-sodium diet-salty 6 given Increase physical activity as tolerated  Obstructive sleep apnea-reports compliance with CPAP Continue CPAP use  Disposition: Follow-up with Dr. Sallyanne Kuster or me in 1 week.   Jossie Ng. Adebayo Ensminger NP-C    06/23/2020, 3:48 PM [address] Office [phone] Fax [phone]  Notice: This dictation was prepared with Dragon dictation along with smaller phrase technology. Any transcriptional errors that result from this process are unintentional and may not be corrected upon review.

## 2020-06-22 NOTE — Telephone Encounter (Signed)
Spoke with patient's daughter per DPR. Patient beside daughter in car. Patient is short of breath with exertion. He's short of breath when lying down and had to sleep in his recliner last night. Patient has a wheeze per daughter. No cough. No LE edema. No weight gain that they are aware of at this moment. Since Wednesday patient has had 2 episodes where he got lightheaded and almost passed out. They thought patient may have gone back into a-fib but yesterday his HR resolved to the 60s. Currently HR regular and 105 after walking back to the car from the lab.   Patient placed on schedule with Denyse Amass for tomorrow 11/24 at 3:15pm. Patient instructed to report to the ER if symptoms worsen.

## 2020-06-23 ENCOUNTER — Encounter: Payer: Self-pay | Admitting: General Practice

## 2020-06-23 ENCOUNTER — Other Ambulatory Visit: Payer: Self-pay

## 2020-06-23 ENCOUNTER — Ambulatory Visit: Payer: Medicare HMO | Admitting: General Practice

## 2020-06-23 VITALS — BP 122/70 | HR 110 | Ht 70.0 in | Wt 182.0 lb

## 2020-06-23 DIAGNOSIS — R5383 Other fatigue: Secondary | ICD-10-CM | POA: Diagnosis not present

## 2020-06-23 DIAGNOSIS — I10 Essential (primary) hypertension: Secondary | ICD-10-CM | POA: Diagnosis not present

## 2020-06-23 DIAGNOSIS — I495 Sick sinus syndrome: Secondary | ICD-10-CM

## 2020-06-23 DIAGNOSIS — R0600 Dyspnea, unspecified: Secondary | ICD-10-CM | POA: Diagnosis not present

## 2020-06-23 DIAGNOSIS — I480 Paroxysmal atrial fibrillation: Secondary | ICD-10-CM | POA: Diagnosis not present

## 2020-06-23 DIAGNOSIS — R0609 Other forms of dyspnea: Secondary | ICD-10-CM

## 2020-06-23 DIAGNOSIS — G4733 Obstructive sleep apnea (adult) (pediatric): Secondary | ICD-10-CM

## 2020-06-23 LAB — COMPREHENSIVE METABOLIC PANEL
ALT: 17 IU/L (ref 0–44)
AST: 16 IU/L (ref 0–40)
Albumin/Globulin Ratio: 1.6 (ref 1.2–2.2)
Albumin: 3.9 g/dL (ref 3.6–4.6)
Alkaline Phosphatase: 102 IU/L (ref 44–121)
BUN/Creatinine Ratio: 23 (ref 10–24)
BUN: 30 mg/dL — ABNORMAL HIGH (ref 8–27)
Bilirubin Total: 0.9 mg/dL (ref 0.0–1.2)
CO2: 24 mmol/L (ref 20–29)
Calcium: 8.5 mg/dL — ABNORMAL LOW (ref 8.6–10.2)
Chloride: 107 mmol/L — ABNORMAL HIGH (ref 96–106)
Creatinine, Ser: 1.3 mg/dL — ABNORMAL HIGH (ref 0.76–1.27)
GFR calc Af Amer: 58 mL/min/{1.73_m2} — ABNORMAL LOW (ref >59)
GFR calc non Af Amer: 50 mL/min/{1.73_m2} — ABNORMAL LOW (ref >59)
Globulin, Total: 2.5 g/dL (ref 1.5–4.5)
Glucose: 143 mg/dL — ABNORMAL HIGH (ref 65–99)
Potassium: 4.1 mmol/L (ref 3.5–5.2)
Sodium: 144 mmol/L (ref 134–144)
Total Protein: 6.4 g/dL (ref 6.0–8.5)

## 2020-06-23 LAB — CBC
Hematocrit: 42.2 % (ref 37.5–51.0)
Hemoglobin: 13.6 g/dL (ref 13.0–17.7)
MCH: 31.6 pg (ref 26.6–33.0)
MCHC: 32.2 g/dL (ref 31.5–35.7)
MCV: 98 fL — ABNORMAL HIGH (ref 79–97)
Platelets: 193 10*3/uL (ref 150–450)
RBC: 4.31 x10E6/uL (ref 4.14–5.80)
RDW: 13 % (ref 11.6–15.4)
WBC: 9.5 10*3/uL (ref 3.4–10.8)

## 2020-06-23 MED ORDER — METOPROLOL SUCCINATE ER 50 MG PO TB24: 50.0000 mg | ORAL_TABLET | Freq: Every day | ORAL | 6 refills | Status: DC

## 2020-06-23 NOTE — Patient Instructions (Signed)
Medication Instructions:  INCREASE METOPROLOL 50MG  DAILY-TAKE 2 OF WHAT YOU HAVE UNTIL GONE. *If you need a refill on your cardiac medications before your next appointment, please call your pharmacy*  Testing/Procedures:  Echocardiogram - Your physician has requested that you have an echocardiogram. Echocardiography is a painless test that uses sound waves to create images of your heart. It provides your doctor with information about the size and shape of your heart and how well your heart's chambers and valves are working. This procedure takes approximately one hour. There are no restrictions for this procedure. This will be performed at our [facility] location - [address].  Special Instructions INCREASE HYDRATION-MAKE SURE YOU KEEP YOURSELF HYDRATED  PLEASE READ AND FOLLOW SALTY 6-ATTACHED-1,800mg  daily  Follow-Up: Your next appointment:  MAKE SURE TO [place] In Person with [name], MD   At [facility], you and your health needs are our priority.  As part of our continuing mission to provide you with exceptional heart care, we have created designated Provider Care Teams.  These Care Teams include your primary Cardiologist (physician) and Advanced Practice Providers (APPs -  Physician Assistants and Nurse Practitioners) who all work together to provide you with the care you need, when you need it.            6 SALTY THINGS TO AVOID     1,800MG  DAILY

## 2020-06-28 ENCOUNTER — Encounter: Payer: Self-pay | Admitting: Cardiovascular Disease

## 2020-06-28 ENCOUNTER — Ambulatory Visit (INDEPENDENT_AMBULATORY_CARE_PROVIDER_SITE_OTHER): Payer: Medicare HMO | Admitting: Cardiovascular Disease

## 2020-06-28 ENCOUNTER — Other Ambulatory Visit: Payer: Self-pay

## 2020-06-28 VITALS — BP 132/78 | HR 108 | Ht 69.0 in | Wt 186.6 lb

## 2020-06-28 DIAGNOSIS — I5033 Acute on chronic diastolic (congestive) heart failure: Secondary | ICD-10-CM

## 2020-06-28 DIAGNOSIS — Z8679 Personal history of other diseases of the circulatory system: Secondary | ICD-10-CM

## 2020-06-28 DIAGNOSIS — I10 Essential (primary) hypertension: Secondary | ICD-10-CM

## 2020-06-28 DIAGNOSIS — Z7901 Long term (current) use of anticoagulants: Secondary | ICD-10-CM

## 2020-06-28 DIAGNOSIS — I471 Supraventricular tachycardia: Secondary | ICD-10-CM

## 2020-06-28 DIAGNOSIS — I351 Nonrheumatic aortic (valve) insufficiency: Secondary | ICD-10-CM | POA: Diagnosis not present

## 2020-06-28 DIAGNOSIS — G4733 Obstructive sleep apnea (adult) (pediatric): Secondary | ICD-10-CM | POA: Diagnosis not present

## 2020-06-28 DIAGNOSIS — I495 Sick sinus syndrome: Secondary | ICD-10-CM

## 2020-06-28 DIAGNOSIS — I77810 Thoracic aortic ectasia: Secondary | ICD-10-CM

## 2020-06-28 DIAGNOSIS — I480 Paroxysmal atrial fibrillation: Secondary | ICD-10-CM

## 2020-06-28 LAB — PACEMAKER DEVICE OBSERVATION

## 2020-06-28 MED ORDER — FUROSEMIDE 20 MG PO TABS: 20.0000 mg | ORAL_TABLET | Freq: Every day | ORAL | 3 refills | Status: DC

## 2020-06-28 MED ORDER — AMIODARONE HCL 400 MG PO TABS
400.0000 mg | ORAL_TABLET | Freq: Every day | ORAL | 3 refills | Status: DC
Start: 2020-06-28 — End: 2020-07-28

## 2020-06-28 NOTE — Patient Instructions (Addendum)
Medication Instructions:  INCREASE the Amiodarone to 400 mg once daily START Furosemide 20 mg once daily  *If you need a refill on your cardiac medications before your next appointment, please call your pharmacy*   Lab Work: None ordered If you have labs (blood work) drawn today and your tests are completely normal, you will receive your results only by: Marland Kitchen MyChart Message (if you have MyChart) OR . A paper copy in the mail If you have any lab test that is abnormal or we need to change your treatment, we will call you to review the results.   Testing/Procedures: None ordered   Follow-Up: Follow up with Dr. Sallyanne Kuster on 07/28/20 at 10:40 am

## 2020-06-29 ENCOUNTER — Telehealth: Payer: Self-pay

## 2020-06-29 ENCOUNTER — Telehealth (INDEPENDENT_AMBULATORY_CARE_PROVIDER_SITE_OTHER): Payer: Medicare HMO | Admitting: Nurse Practitioner

## 2020-06-29 DIAGNOSIS — U099 Post covid-19 condition, unspecified: Secondary | ICD-10-CM | POA: Diagnosis not present

## 2020-06-29 DIAGNOSIS — I5030 Unspecified diastolic (congestive) heart failure: Secondary | ICD-10-CM | POA: Diagnosis not present

## 2020-06-29 DIAGNOSIS — R5381 Other malaise: Secondary | ICD-10-CM

## 2020-06-29 NOTE — Progress Notes (Signed)
This service is provided via telemedicine  No vital signs collected/recorded due to the encounter was a telemedicine visit.   Location of patient (ex: home, work):  Home  Patient consents to a telephone visit:  Yes, see encounter dated 06/29/2020  Location of the provider (ex: office, home):  [place]  Name of any referring provider:  N/A  Names of all persons participating in the telemedicine service and their role in the encounter:  Sherrie Mustache, Nurse Practitioner, Carroll Kinds, CMA, and patient.

## 2020-06-29 NOTE — Telephone Encounter (Signed)
[name], Jose Patel are scheduled for a virtual visit with your provider today.    Just as we do with appointments in the office, we must obtain your consent to participate.  Your consent will be active for this visit and any virtual visit you may have with one of our providers in the next 365 days.    If you have a MyChart account, I can also send a copy of this consent to you electronically.  All virtual visits are billed to your insurance company just like a traditional visit in the office.  As this is a virtual visit, video technology does not allow for your provider to perform a traditional examination.  This may limit your provider's ability to fully assess your condition.  If your provider identifies any concerns that need to be evaluated in person or the need to arrange testing such as labs, EKG, etc, we will make arrangements to do so.    Although advances in technology are sophisticated, we cannot ensure that it will always work on either your end or our end.  If the connection with a video visit is poor, we may have to switch to a telephone visit.  With either a video or telephone visit, we are not always able to ensure that we have a secure connection.   I need to obtain your verbal consent now.   Are you willing to proceed with your visit today?   Windell Norfolk, daughter,has provided verbal consent on 06/29/2020 for a virtual visit (video or telephone).   Carroll Kinds, [facility] 06/29/2020  3:29 PM

## 2020-06-29 NOTE — Progress Notes (Signed)
Careteam: Patient Care Team: [name], NP as PCP - General (Geriatric Medicine) [name], MD as PCP - Cardiology (Cardiology) Center, [facility] (Gastroenterology) [name], MD as Consulting Physician  Advanced Directive information    Allergies  Allergen Reactions   Aricept [Donepezil] Diarrhea, Nausea And Vomiting and Other (See Comments)    Cannot tolerate- bradycardia, also   Tropicamide Nausea And Vomiting    Used to dilate eyes (might be this- was switched to something this was tolerable, after this)   Adhesive [Tape] Rash    Chief Complaint  Patient presents with   Acute Visit    Requesting home health referral. According to daughet,Tina,patient is having breathing issues. Patient is having residual effects from recent COVID-19 infection and Congestive heart failure. He is not moving around as much increased weakness. May need some physical therapy.Patient has been sleeping in chair for about 2 weeks.     HPI: Patient is a 86 y.o. male for follow up. Reports HR is elevated 113 and he is wheezing.  Reports elevated HR for 2-3 weeks. Sinus tachycardia not afib.  He has been following very closely with cardiologist who started him on lasix, took first dose this morning. originally thought he was dehydrated but now thought to be more in overload Reports worsening LE edema- has gained a few lbs between last week and this week.  He had COVID in September, got the infusion but has continued to be weak and debilitated.  Now with worsening CHF.  Sleeping in recliner due to shortness of breath and wheezing.  132/78 Review of Systems:  Review of Systems  Constitutional: Positive for malaise/fatigue.  Respiratory: Positive for shortness of breath and wheezing.   Cardiovascular: Positive for leg swelling.  Genitourinary: Negative for dysuria, frequency and urgency.  Neurological: Positive for dizziness and weakness. Negative for headaches.    Psychiatric/Behavioral: Positive for memory loss.    Past Medical History:  Diagnosis Date   A-fib ([place])    Anticoagulated on Coumadin    Per [facility] from [facility]    Aortic regurgitation    Aortic stenosis    CHF (congestive heart failure) ([place])    Coronary artery disease    CVA (cerebral vascular accident) ([place])    hx of CVA noted on CT from 02/19/18   Diabetes mellitus, type 2 ([place])    Gastro-esophageal reflux disease without esophagitis    Per records from previous provider, [facility]   Heart attack [facility])    History of CT scan of head 02/19/2018   Per records from previous provider, [facility]. Chronic changes small vessel disease   History of ECG    03/26/14- Sinus Tachycardia @ 107 bmp, QRS 78 msec, QT 298 msec, QTc 361 msec. 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 [facility])    Per records from previous provider, [facility]    MI (myocardial infarction) [facility])    Per records from [place]    Mitral regurgitation    Mitral stenosis    Other intervertebral disc degeneration, lumbar region    Per records from previous provider, [facility]   Radiculopathy, lumbar region    Per records from previous provider, [place] [facility] [place]  [facility]   Sinus bradycardia    Per records from [place]    Sleep apnea    Transient ischemic attack    10 to 12 years ago   Typical atrial flutter [facility])    Per records from [place]    Past Surgical History:  Procedure Laterality Date   [place] N/A 12/26/2018   Procedure: CARDIOVERSION;  Surgeon: Pixie Casino, MD;  Location: [place];  Service: Cardiovascular;  Laterality: N/A;   CARDIOVERSION N/A 05/14/2019   Procedure: CARDIOVERSION;  Surgeon: [name], MD;  Location: [facility] ENDOSCOPY;  Service: Cardiovascular;  Laterality: N/A;   CATARACT EXTRACTION     Debridement to right arm with MRSA infection     4 years ago   GALLBLADDER SURGERY     Per records from [place] 07/07/2019   Procedure: PACEMAKER IMPLANT;  Surgeon: [name], MD;  Location: [place] CV LAB;  Service: Cardiovascular;  Laterality: N/A;   TEE WITHOUT CARDIOVERSION N/A 12/26/2018   Procedure: TRANSESOPHAGEAL ECHOCARDIOGRAM (TEE);  Surgeon: Pixie Casino, MD;  Location: [facility] ENDOSCOPY;  Service: Cardiovascular;  Laterality: N/A;   Social History:   reports that he quit smoking about 66 years ago. His smoking use included cigarettes and cigars. He quit after 1.00 year of use. He has never used smokeless tobacco. He reports that he does not drink alcohol and does not use drugs.  Family History  Problem Relation Age of Onset   Heart failure Mother    Hyperlipidemia Mother    Hypertension Son    Hyperlipidemia Son    Arthritis Son    Arthritis Daughter    Hypertension Son     Medications: Patient's Medications  New Prescriptions   No medications on file  Previous Medications   AMIODARONE (PACERONE) 400 MG TABLET    Take 1 tablet (400 mg total) by mouth daily.   AMLODIPINE (NORVASC) 10 MG TABLET    Take 1 tablet (10 mg total) by mouth daily.   AMOXICILLIN (AMOXIL) 500 MG CAPSULE    Take 2,000 mg by mouth See admin instructions. Take 2,000 mg by mouth one hour prior to dental appointment   CHOLECALCIFEROL (VITAMIN D3) 50 MCG (2000 UT) TABS    Take 2,000 Units by mouth daily with breakfast.   FUROSEMIDE (LASIX) 20 MG TABLET    Take 1 tablet (20 mg total) by mouth daily.   GABAPENTIN (NEURONTIN) 100 MG CAPSULE    Take 1 capsule (100 mg total) by mouth daily as needed.   HYDRALAZINE  (APRESOLINE) 50 MG TABLET    Take 1 tablet (50 mg total) by mouth 2 (two) times daily.   LOSARTAN (COZAAR) 100 MG TABLET    Take 1 tablet (100 mg total) by mouth daily.   MAGNESIUM 400 MG CAPS    Take 1 tablet by mouth daily.   MELATONIN 5 MG TABS    Take 5 mg by mouth at bedtime as needed (for sleep).    MEMANTINE (NAMENDA) 10 MG TABLET    TAKE ONE TABLET BY MOUTH TWICE DAILY   METOPROLOL SUCCINATE (TOPROL-XL) 50 MG 24 HR TABLET    Take 1 tablet (50 mg total) by mouth daily.   RIVAROXABAN (XARELTO) 20 MG TABS TABLET    TAKE 1 TABLET (20 MG TOTAL) BY MOUTH DAILY WITH SUPPER.   TAMSULOSIN (FLOMAX) 0.4 MG CAPS CAPSULE    Take  1 capsule (0.4 mg total) by mouth daily.  Modified Medications   No medications on file  Discontinued Medications   No medications on file    Physical Exam:  There were no vitals filed for this visit. There is no height or weight on file to calculate BMI. Wt Readings from Last 3 Encounters:  06/28/20 186 lb 9.6 oz (84.6 kg)  06/23/20 182 lb (82.6 kg)  04/08/20 181 lb 12.8 oz (82.5 kg)      Labs reviewed: Basic Metabolic Panel: Recent Labs    07/04/19 1510 10/15/19 1445 06/22/20 1123  NA 142 142 144  K 4.7 4.7 4.1  CL 105 108 107*  CO2 23 29 24   GLUCOSE 98 93 143*  BUN 24 32* 30*  CREATININE 1.19 1.40* 1.30*  CALCIUM 8.4* 9.1 8.5*   Liver Function Tests: Recent Labs    10/15/19 1445 06/22/20 1123  AST 26 16  ALT 40 17  ALKPHOS  --  102  BILITOT 0.4 0.9  PROT 6.9 6.4  ALBUMIN  --  3.9   No results for input(s): LIPASE, AMYLASE in the last 8760 hours. No results for input(s): AMMONIA in the last 8760 hours. CBC: Recent Labs    07/04/19 1510 10/15/19 1445 06/22/20 1123  WBC 6.5 6.7 9.5  NEUTROABS  --  3,832  --   HGB 13.2 14.3 13.6  HCT 40.8 43.7 42.2  MCV 97 95.4 98*  PLT 200 219 193   Lipid Panel: Recent Labs    10/15/19 1445  CHOL 187  HDL 41  LDLCALC 120*  TRIG 138  CHOLHDL 4.6   TSH: No results for input(s): TSH in  the last 8760 hours. A1C: Lab Results  Component Value Date   HGBA1C 5.8 (H) 10/15/2019     Assessment/Plan 1. Diastolic congestive heart failure, unspecified HF chronicity (HCC) Worsening shortness of breath, DOE, wheezing and LE edema. Started lasix per cardiology with ongoing follow up with echo and labs per cardiology.  - Ambulatory referral to [place] -worsening weakness and debility after COVID-19 and now with worsening shortness of breath on exertion and trouble with ADLs. Daughter has been helping at this time.  - Ambulatory referral to Home Health  3. Post covid-19 condition, unspecified - Ambulatory referral to [place] due to ongoing weakness and debility post-covid  Yarithza Mink K. Harle Battiest  [facility] [phone]    Virtual Visit via Deloris Ping  I connected with patient on 06/29/20 at  3:15 PM EST by video and verified that I am speaking with the correct person using two identifiers.  Location: Patient: home Provider: [place]   I discussed the limitations, risks, security and privacy concerns of performing an evaluation and management service by telephone and the availability of in person appointments. I also discussed with the patient that there may be a patient responsible charge related to this service. The patient expressed understanding and agreed to proceed.   I discussed the assessment and treatment plan with the patient. The patient was provided an opportunity to ask questions and all were answered. The patient agreed with the plan and demonstrated an understanding of the instructions.   The patient was advised to call back or seek an in-person evaluation if the symptoms worsen or if the condition fails to improve as anticipated.  I provided 15 minutes of non-face-to-face time during this encounter.  Carlos American. Harle Battiest Avs printed and mailed

## 2020-07-02 ENCOUNTER — Encounter: Payer: Self-pay | Admitting: Cardiovascular Disease
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
## 2020-07-03 ENCOUNTER — Encounter: Payer: Self-pay | Admitting: Nurse Practitioner

## 2020-07-07 DIAGNOSIS — I080 Rheumatic disorders of both mitral and aortic valves: Secondary | ICD-10-CM

## 2020-07-07 DIAGNOSIS — I350 Nonrheumatic aortic (valve) stenosis: Secondary | ICD-10-CM

## 2020-07-07 DIAGNOSIS — M5116 Intervertebral disc disorders with radiculopathy, lumbar region: Secondary | ICD-10-CM

## 2020-07-07 DIAGNOSIS — I5030 Unspecified diastolic (congestive) heart failure: Secondary | ICD-10-CM | POA: Diagnosis not present

## 2020-07-07 DIAGNOSIS — E119 Type 2 diabetes mellitus without complications: Secondary | ICD-10-CM | POA: Diagnosis not present

## 2020-07-07 DIAGNOSIS — K219 Gastro-esophageal reflux disease without esophagitis: Secondary | ICD-10-CM | POA: Diagnosis not present

## 2020-07-07 DIAGNOSIS — I110 Hypertensive heart disease with heart failure: Secondary | ICD-10-CM | POA: Diagnosis not present

## 2020-07-07 DIAGNOSIS — I4892 Unspecified atrial flutter: Secondary | ICD-10-CM | POA: Diagnosis not present

## 2020-07-07 DIAGNOSIS — I4819 Other persistent atrial fibrillation: Secondary | ICD-10-CM | POA: Diagnosis not present

## 2020-07-07 DIAGNOSIS — I2510 Atherosclerotic heart disease of native coronary artery without angina pectoris: Secondary | ICD-10-CM

## 2020-07-08 DIAGNOSIS — I350 Nonrheumatic aortic (valve) stenosis: Secondary | ICD-10-CM | POA: Diagnosis not present

## 2020-07-08 DIAGNOSIS — E119 Type 2 diabetes mellitus without complications: Secondary | ICD-10-CM | POA: Diagnosis not present

## 2020-07-08 DIAGNOSIS — I4892 Unspecified atrial flutter: Secondary | ICD-10-CM | POA: Diagnosis not present

## 2020-07-08 DIAGNOSIS — M5116 Intervertebral disc disorders with radiculopathy, lumbar region: Secondary | ICD-10-CM | POA: Diagnosis not present

## 2020-07-08 DIAGNOSIS — I5030 Unspecified diastolic (congestive) heart failure: Secondary | ICD-10-CM | POA: Diagnosis not present

## 2020-07-08 DIAGNOSIS — I2510 Atherosclerotic heart disease of native coronary artery without angina pectoris: Secondary | ICD-10-CM | POA: Diagnosis not present

## 2020-07-08 DIAGNOSIS — I080 Rheumatic disorders of both mitral and aortic valves: Secondary | ICD-10-CM | POA: Diagnosis not present

## 2020-07-08 DIAGNOSIS — K219 Gastro-esophageal reflux disease without esophagitis: Secondary | ICD-10-CM | POA: Diagnosis not present

## 2020-07-08 DIAGNOSIS — I4819 Other persistent atrial fibrillation: Secondary | ICD-10-CM | POA: Diagnosis not present

## 2020-07-08 DIAGNOSIS — I110 Hypertensive heart disease with heart failure: Secondary | ICD-10-CM | POA: Diagnosis not present

## 2020-07-13 DIAGNOSIS — I4892 Unspecified atrial flutter: Secondary | ICD-10-CM | POA: Diagnosis not present

## 2020-07-13 DIAGNOSIS — I4819 Other persistent atrial fibrillation: Secondary | ICD-10-CM | POA: Diagnosis not present

## 2020-07-13 DIAGNOSIS — I5030 Unspecified diastolic (congestive) heart failure: Secondary | ICD-10-CM | POA: Diagnosis not present

## 2020-07-13 DIAGNOSIS — I110 Hypertensive heart disease with heart failure: Secondary | ICD-10-CM | POA: Diagnosis not present

## 2020-07-13 DIAGNOSIS — M5116 Intervertebral disc disorders with radiculopathy, lumbar region: Secondary | ICD-10-CM | POA: Diagnosis not present

## 2020-07-13 DIAGNOSIS — E119 Type 2 diabetes mellitus without complications: Secondary | ICD-10-CM | POA: Diagnosis not present

## 2020-07-13 DIAGNOSIS — K219 Gastro-esophageal reflux disease without esophagitis: Secondary | ICD-10-CM | POA: Diagnosis not present

## 2020-07-13 DIAGNOSIS — I350 Nonrheumatic aortic (valve) stenosis: Secondary | ICD-10-CM | POA: Diagnosis not present

## 2020-07-13 DIAGNOSIS — I2510 Atherosclerotic heart disease of native coronary artery without angina pectoris: Secondary | ICD-10-CM | POA: Diagnosis not present

## 2020-07-13 DIAGNOSIS — I080 Rheumatic disorders of both mitral and aortic valves: Secondary | ICD-10-CM | POA: Diagnosis not present

## 2020-07-15 DIAGNOSIS — I4819 Other persistent atrial fibrillation: Secondary | ICD-10-CM | POA: Diagnosis not present

## 2020-07-15 DIAGNOSIS — I4892 Unspecified atrial flutter: Secondary | ICD-10-CM | POA: Diagnosis not present

## 2020-07-15 DIAGNOSIS — M5116 Intervertebral disc disorders with radiculopathy, lumbar region: Secondary | ICD-10-CM | POA: Diagnosis not present

## 2020-07-15 DIAGNOSIS — I2510 Atherosclerotic heart disease of native coronary artery without angina pectoris: Secondary | ICD-10-CM | POA: Diagnosis not present

## 2020-07-15 DIAGNOSIS — I080 Rheumatic disorders of both mitral and aortic valves: Secondary | ICD-10-CM | POA: Diagnosis not present

## 2020-07-15 DIAGNOSIS — I350 Nonrheumatic aortic (valve) stenosis: Secondary | ICD-10-CM | POA: Diagnosis not present

## 2020-07-15 DIAGNOSIS — E119 Type 2 diabetes mellitus without complications: Secondary | ICD-10-CM | POA: Diagnosis not present

## 2020-07-15 DIAGNOSIS — I110 Hypertensive heart disease with heart failure: Secondary | ICD-10-CM | POA: Diagnosis not present

## 2020-07-15 DIAGNOSIS — K219 Gastro-esophageal reflux disease without esophagitis: Secondary | ICD-10-CM | POA: Diagnosis not present

## 2020-07-15 DIAGNOSIS — I5030 Unspecified diastolic (congestive) heart failure: Secondary | ICD-10-CM | POA: Diagnosis not present

## 2020-07-19 ENCOUNTER — Other Ambulatory Visit: Payer: Self-pay

## 2020-07-19 ENCOUNTER — Ambulatory Visit (INDEPENDENT_AMBULATORY_CARE_PROVIDER_SITE_OTHER): Payer: Medicare HMO

## 2020-07-19 ENCOUNTER — Ambulatory Visit (HOSPITAL_COMMUNITY): Payer: Medicare HMO | Attending: General Practice

## 2020-07-19 DIAGNOSIS — R69 Illness, unspecified: Secondary | ICD-10-CM | POA: Diagnosis not present

## 2020-07-19 DIAGNOSIS — I350 Nonrheumatic aortic (valve) stenosis: Secondary | ICD-10-CM | POA: Diagnosis not present

## 2020-07-19 DIAGNOSIS — I495 Sick sinus syndrome: Secondary | ICD-10-CM

## 2020-07-19 DIAGNOSIS — R0600 Dyspnea, unspecified: Secondary | ICD-10-CM | POA: Diagnosis not present

## 2020-07-19 DIAGNOSIS — E119 Type 2 diabetes mellitus without complications: Secondary | ICD-10-CM | POA: Diagnosis not present

## 2020-07-19 DIAGNOSIS — I110 Hypertensive heart disease with heart failure: Secondary | ICD-10-CM | POA: Diagnosis not present

## 2020-07-19 DIAGNOSIS — M5116 Intervertebral disc disorders with radiculopathy, lumbar region: Secondary | ICD-10-CM | POA: Diagnosis not present

## 2020-07-19 DIAGNOSIS — R5383 Other fatigue: Secondary | ICD-10-CM | POA: Insufficient documentation

## 2020-07-19 DIAGNOSIS — I4892 Unspecified atrial flutter: Secondary | ICD-10-CM | POA: Diagnosis not present

## 2020-07-19 DIAGNOSIS — K219 Gastro-esophageal reflux disease without esophagitis: Secondary | ICD-10-CM | POA: Diagnosis not present

## 2020-07-19 DIAGNOSIS — I4819 Other persistent atrial fibrillation: Secondary | ICD-10-CM | POA: Diagnosis not present

## 2020-07-19 DIAGNOSIS — I5030 Unspecified diastolic (congestive) heart failure: Secondary | ICD-10-CM | POA: Diagnosis not present

## 2020-07-19 DIAGNOSIS — R0609 Other forms of dyspnea: Secondary | ICD-10-CM

## 2020-07-19 DIAGNOSIS — I080 Rheumatic disorders of both mitral and aortic valves: Secondary | ICD-10-CM | POA: Diagnosis not present

## 2020-07-19 DIAGNOSIS — I2510 Atherosclerotic heart disease of native coronary artery without angina pectoris: Secondary | ICD-10-CM | POA: Diagnosis not present

## 2020-07-19 LAB — ECHOCARDIOGRAM COMPLETE
Area-P 1/2: 2.92 cm2
P 1/2 time: 601 ms
S' Lateral: 3.4 cm

## 2020-07-20 LAB — CUP PACEART REMOTE DEVICE CHECK
Battery Remaining Longevity: 147 mo
Battery Voltage: 3.03 V
Brady Statistic AP VP Percent: 0.08 %
Brady Statistic AP VS Percent: 88.99 %
Brady Statistic AS VP Percent: 0.02 %
Brady Statistic AS VS Percent: 10.91 %
Brady Statistic RA Percent Paced: 87.91 %
Brady Statistic RV Percent Paced: 0.1 %
Date Time Interrogation Session: 20211219192406
Implantable Lead Implant Date: 20201207
Implantable Lead Implant Date: 20201207
Implantable Lead Location: 753859
Implantable Lead Location: 753860
Implantable Lead Model: 5076
Implantable Lead Model: 5076
Implantable Pulse Generator Implant Date: 20201207
Lead Channel Impedance Value: 323 Ohm
Lead Channel Impedance Value: 380 Ohm
Lead Channel Impedance Value: 418 Ohm
Lead Channel Impedance Value: 475 Ohm
Lead Channel Pacing Threshold Amplitude: 0.625 V
Lead Channel Pacing Threshold Amplitude: 1 V
Lead Channel Pacing Threshold Pulse Width: 0.4 ms
Lead Channel Pacing Threshold Pulse Width: 0.4 ms
Lead Channel Sensing Intrinsic Amplitude: 2.25 mV
Lead Channel Sensing Intrinsic Amplitude: 2.25 mV
Lead Channel Sensing Intrinsic Amplitude: 22.875 mV
Lead Channel Sensing Intrinsic Amplitude: 22.875 mV
Lead Channel Setting Pacing Amplitude: 1.5 V
Lead Channel Setting Pacing Amplitude: 2.5 V
Lead Channel Setting Pacing Pulse Width: 0.4 ms
Lead Channel Setting Sensing Sensitivity: 1.2 mV

## 2020-07-22 DIAGNOSIS — K219 Gastro-esophageal reflux disease without esophagitis: Secondary | ICD-10-CM | POA: Diagnosis not present

## 2020-07-22 DIAGNOSIS — I4892 Unspecified atrial flutter: Secondary | ICD-10-CM | POA: Diagnosis not present

## 2020-07-22 DIAGNOSIS — E119 Type 2 diabetes mellitus without complications: Secondary | ICD-10-CM | POA: Diagnosis not present

## 2020-07-22 DIAGNOSIS — I5030 Unspecified diastolic (congestive) heart failure: Secondary | ICD-10-CM | POA: Diagnosis not present

## 2020-07-22 DIAGNOSIS — M5116 Intervertebral disc disorders with radiculopathy, lumbar region: Secondary | ICD-10-CM | POA: Diagnosis not present

## 2020-07-22 DIAGNOSIS — I110 Hypertensive heart disease with heart failure: Secondary | ICD-10-CM | POA: Diagnosis not present

## 2020-07-22 DIAGNOSIS — I2510 Atherosclerotic heart disease of native coronary artery without angina pectoris: Secondary | ICD-10-CM | POA: Diagnosis not present

## 2020-07-22 DIAGNOSIS — I350 Nonrheumatic aortic (valve) stenosis: Secondary | ICD-10-CM | POA: Diagnosis not present

## 2020-07-22 DIAGNOSIS — I4819 Other persistent atrial fibrillation: Secondary | ICD-10-CM | POA: Diagnosis not present

## 2020-07-22 DIAGNOSIS — I080 Rheumatic disorders of both mitral and aortic valves: Secondary | ICD-10-CM | POA: Diagnosis not present

## 2020-07-26 IMAGING — DX PORTABLE CHEST - 1 VIEW
2 series · 2 of 2 positions shown · non-contrast
Comparison: November 18, 2017

CLINICAL DATA: Cardiac palpitations

EXAM:
PORTABLE CHEST 1 VIEW

[chest ap (1 of 2)]
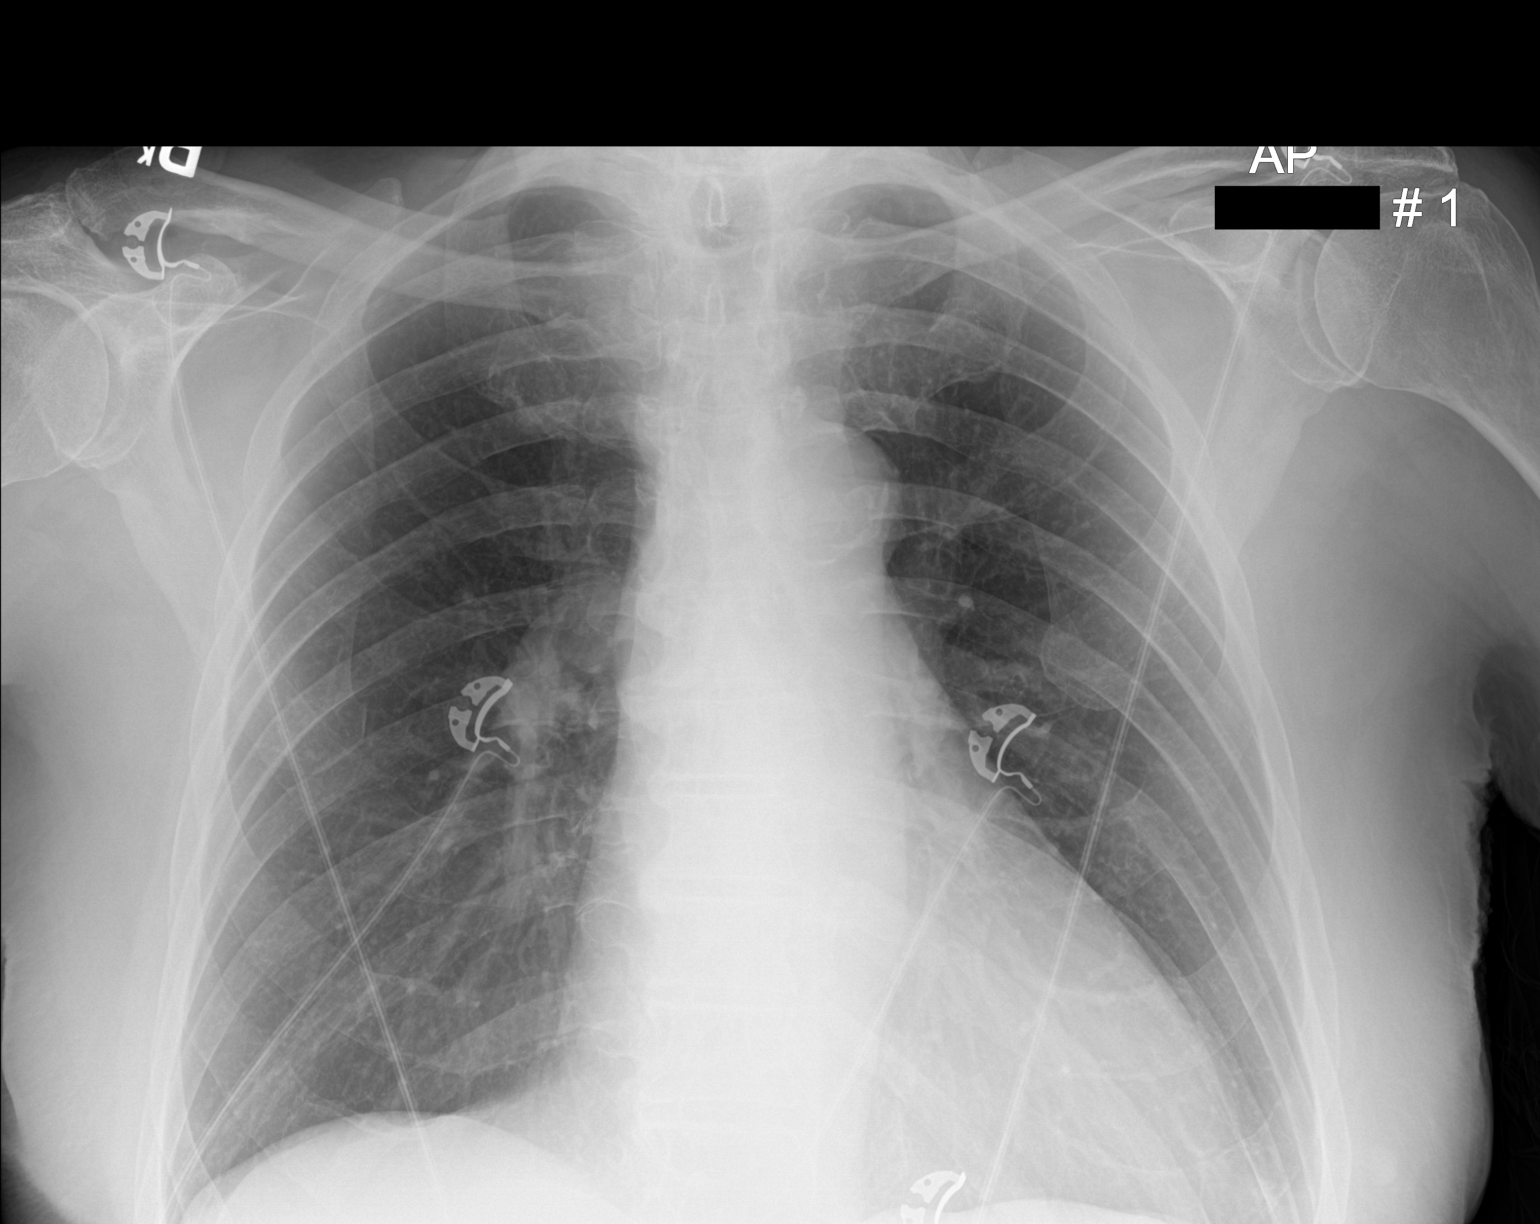

[chest ap (2 of 2)]
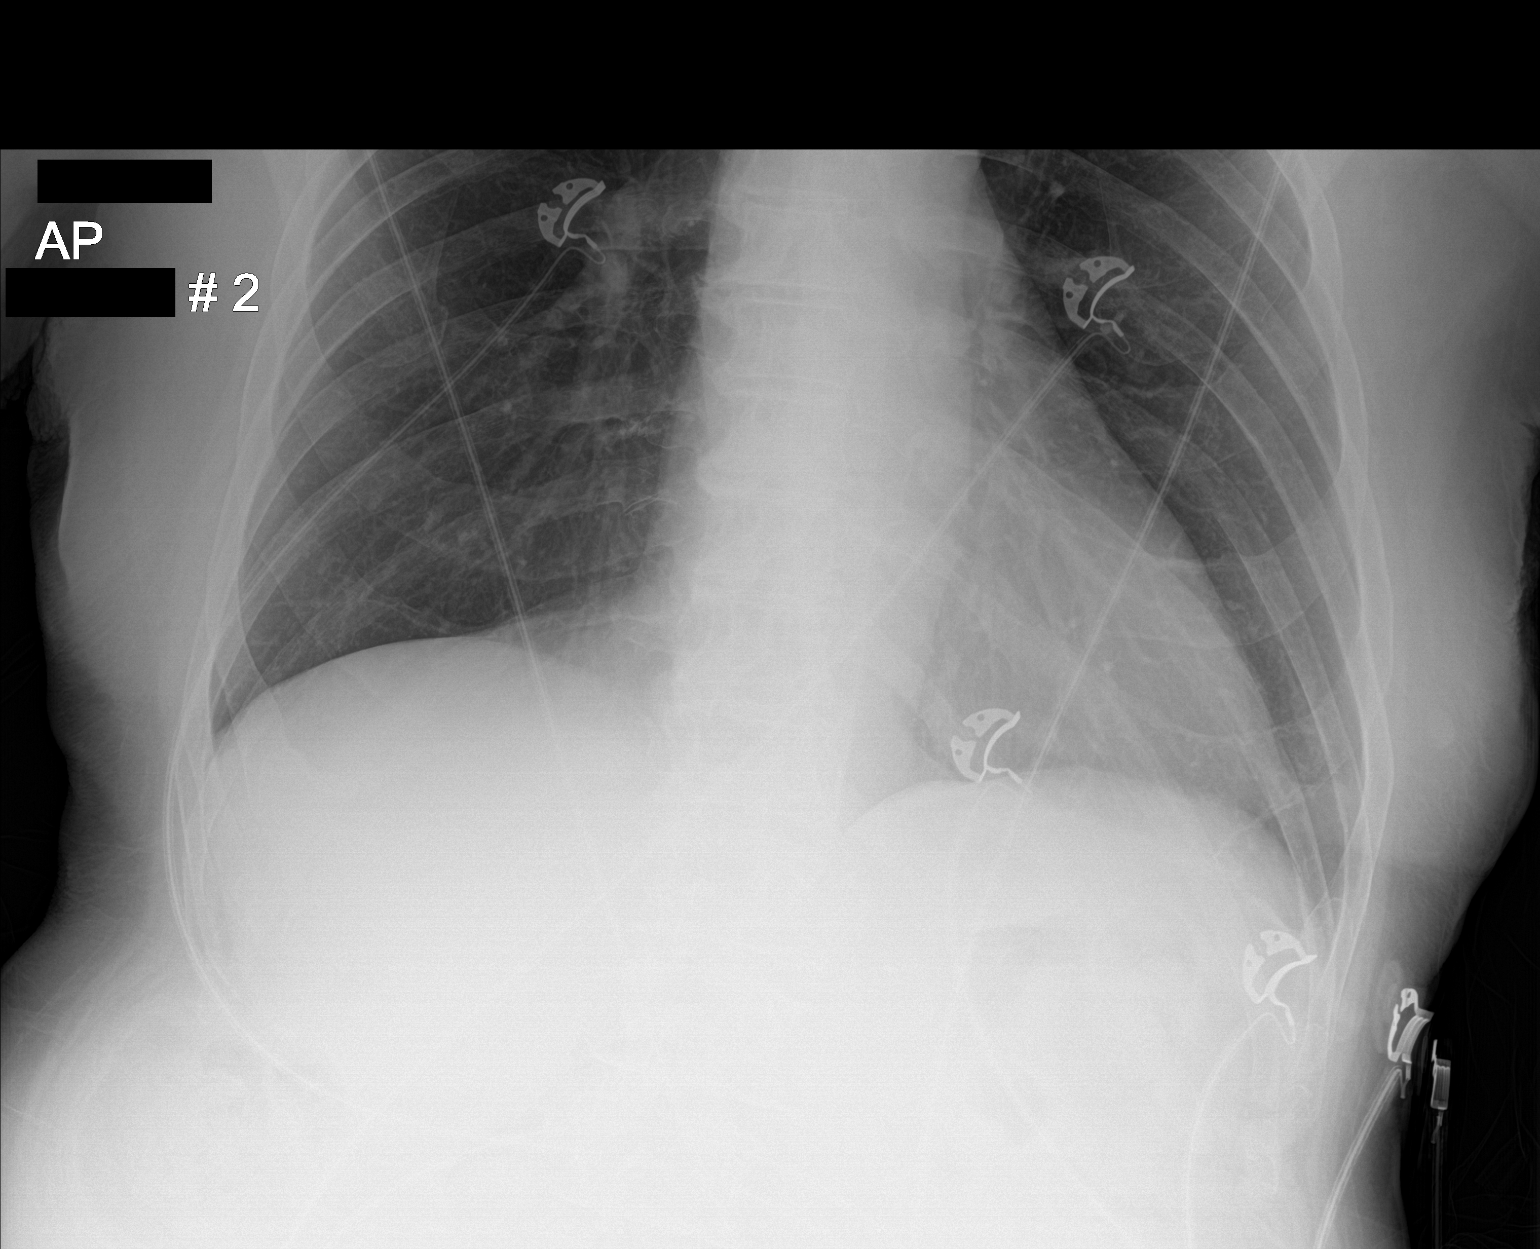

[2 of 2 positions shown; findings below may reference images not displayed]

FINDINGS: There is no edema or consolidation. Heart is borderline enlarged
with pulmonary vascularity normal. No adenopathy. There is aortic
atherosclerosis. No bone lesions.
IMPRESSION: Borderline cardiomegaly. No edema or consolidation. Aortic
Atherosclerosis ([id number]).

## 2020-07-27 DIAGNOSIS — I2510 Atherosclerotic heart disease of native coronary artery without angina pectoris: Secondary | ICD-10-CM | POA: Diagnosis not present

## 2020-07-27 DIAGNOSIS — I5030 Unspecified diastolic (congestive) heart failure: Secondary | ICD-10-CM | POA: Diagnosis not present

## 2020-07-27 DIAGNOSIS — K219 Gastro-esophageal reflux disease without esophagitis: Secondary | ICD-10-CM | POA: Diagnosis not present

## 2020-07-27 DIAGNOSIS — I350 Nonrheumatic aortic (valve) stenosis: Secondary | ICD-10-CM | POA: Diagnosis not present

## 2020-07-27 DIAGNOSIS — I4819 Other persistent atrial fibrillation: Secondary | ICD-10-CM | POA: Diagnosis not present

## 2020-07-27 DIAGNOSIS — E119 Type 2 diabetes mellitus without complications: Secondary | ICD-10-CM | POA: Diagnosis not present

## 2020-07-27 DIAGNOSIS — I4892 Unspecified atrial flutter: Secondary | ICD-10-CM | POA: Diagnosis not present

## 2020-07-27 DIAGNOSIS — I080 Rheumatic disorders of both mitral and aortic valves: Secondary | ICD-10-CM | POA: Diagnosis not present

## 2020-07-27 DIAGNOSIS — M5116 Intervertebral disc disorders with radiculopathy, lumbar region: Secondary | ICD-10-CM | POA: Diagnosis not present

## 2020-07-27 DIAGNOSIS — I110 Hypertensive heart disease with heart failure: Secondary | ICD-10-CM | POA: Diagnosis not present

## 2020-07-28 ENCOUNTER — Encounter: Payer: Self-pay | Admitting: Cardiovascular Disease

## 2020-07-28 ENCOUNTER — Ambulatory Visit (INDEPENDENT_AMBULATORY_CARE_PROVIDER_SITE_OTHER): Payer: Medicare HMO | Admitting: Cardiovascular Disease

## 2020-07-28 ENCOUNTER — Other Ambulatory Visit: Payer: Self-pay

## 2020-07-28 ENCOUNTER — Telehealth: Payer: Self-pay | Admitting: Cardiovascular Disease

## 2020-07-28 VITALS — BP 88/50 | HR 75 | Ht 69.0 in | Wt 178.0 lb

## 2020-07-28 DIAGNOSIS — I351 Nonrheumatic aortic (valve) insufficiency: Secondary | ICD-10-CM

## 2020-07-28 DIAGNOSIS — E7800 Pure hypercholesterolemia, unspecified: Secondary | ICD-10-CM | POA: Diagnosis not present

## 2020-07-28 DIAGNOSIS — I5043 Acute on chronic combined systolic (congestive) and diastolic (congestive) heart failure: Secondary | ICD-10-CM | POA: Diagnosis not present

## 2020-07-28 DIAGNOSIS — G4733 Obstructive sleep apnea (adult) (pediatric): Secondary | ICD-10-CM

## 2020-07-28 DIAGNOSIS — I471 Supraventricular tachycardia: Secondary | ICD-10-CM

## 2020-07-28 DIAGNOSIS — I495 Sick sinus syndrome: Secondary | ICD-10-CM | POA: Diagnosis not present

## 2020-07-28 DIAGNOSIS — I77810 Thoracic aortic ectasia: Secondary | ICD-10-CM

## 2020-07-28 DIAGNOSIS — I10 Essential (primary) hypertension: Secondary | ICD-10-CM

## 2020-07-28 DIAGNOSIS — Z7901 Long term (current) use of anticoagulants: Secondary | ICD-10-CM | POA: Diagnosis not present

## 2020-07-28 DIAGNOSIS — I480 Paroxysmal atrial fibrillation: Secondary | ICD-10-CM | POA: Diagnosis not present

## 2020-07-28 MED ORDER — AMLODIPINE BESYLATE 5 MG PO TABS: 5.0000 mg | ORAL_TABLET | Freq: Every day | ORAL | 1 refills | Status: DC

## 2020-07-28 MED ORDER — AMIODARONE HCL 200 MG PO TABS
300.0000 mg | ORAL_TABLET | Freq: Every day | ORAL | 1 refills | Status: DC
Start: 2020-07-28 — End: 2020-11-01

## 2020-07-28 MED ORDER — FUROSEMIDE 20 MG PO TABS
20.0000 mg | ORAL_TABLET | ORAL | 3 refills | Status: DC | PRN
Start: 2020-07-28 — End: 2020-07-28

## 2020-07-28 MED ORDER — METOPROLOL SUCCINATE ER 50 MG PO TB24
50.0000 mg | ORAL_TABLET | Freq: Every day | ORAL | 3 refills | Status: DC
Start: 2020-07-28 — End: 2021-10-04

## 2020-07-28 NOTE — Patient Instructions (Signed)
Medication Instructions:  DECREASE the Amlodipine to 5 mg once daily DECREASE the Amiodarone to 300 mg once daily (a tablet and a half of the 200 mg tablet) Take the Furosemide as needed.   *If you need a refill on your cardiac medications before your next appointment, please call your pharmacy*   Lab Work: None ordered If you have labs (blood work) drawn today and your tests are completely normal, you will receive your results only by: Marland Kitchen MyChart Message (if you have MyChart) OR . A paper copy in the mail If you have any lab test that is abnormal or we need to change your treatment, we will call you to review the results.   Testing/Procedures: None ordered   Follow-Up: At [facility], you and your health needs are our priority.  As part of our continuing mission to provide you with exceptional heart care, we have created designated Provider Care Teams.  These Care Teams include your primary Cardiologist (physician) and Advanced Practice Providers (APPs -  Physician Assistants and Nurse Practitioners) who all work together to provide you with the care you need, when you need it.  We recommend signing up for the patient portal called "MyChart".  Sign up information is provided on this After Visit Summary.  MyChart is used to connect with patients for Virtual Visits (Telemedicine).  Patients are able to view lab/test results, encounter notes, upcoming appointments, etc.  Non-urgent messages can be sent to your provider as well.   To learn more about what you can do with MyChart, go to [email or web address].    Your next appointment:   3 month(s) on a device day  The format for your next appointment:   In Person  Provider:   Thurmon Fair, MD

## 2020-07-28 NOTE — Telephone Encounter (Signed)
Pt c/o medication issue:  1. Name of Medication: furosemide (LASIX) 20 MG tablet  2. How are you currently taking this medication (dosage and times per day)? N/A  3. Are you having a reaction (difficulty breathing--STAT)? No  4. What is your medication issue? Melissa with [facility] is requesting clarification for medication instructions prior to distributing. She states she would like to discuss the frequency specifically. Please return call to discuss.  Phone#: [phone]
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
## 2020-07-28 NOTE — Telephone Encounter (Signed)
Spoke to [employer] with [employer].She needed directions for Lasix.Advised Lasix 20 mg daily as needed.

## 2020-07-29 DIAGNOSIS — I4819 Other persistent atrial fibrillation: Secondary | ICD-10-CM | POA: Diagnosis not present

## 2020-07-29 DIAGNOSIS — I350 Nonrheumatic aortic (valve) stenosis: Secondary | ICD-10-CM | POA: Diagnosis not present

## 2020-07-29 DIAGNOSIS — I2510 Atherosclerotic heart disease of native coronary artery without angina pectoris: Secondary | ICD-10-CM | POA: Diagnosis not present

## 2020-07-29 DIAGNOSIS — E119 Type 2 diabetes mellitus without complications: Secondary | ICD-10-CM | POA: Diagnosis not present

## 2020-07-29 DIAGNOSIS — I5030 Unspecified diastolic (congestive) heart failure: Secondary | ICD-10-CM | POA: Diagnosis not present

## 2020-07-29 DIAGNOSIS — I080 Rheumatic disorders of both mitral and aortic valves: Secondary | ICD-10-CM | POA: Diagnosis not present

## 2020-07-29 DIAGNOSIS — I110 Hypertensive heart disease with heart failure: Secondary | ICD-10-CM | POA: Diagnosis not present

## 2020-07-29 DIAGNOSIS — M5116 Intervertebral disc disorders with radiculopathy, lumbar region: Secondary | ICD-10-CM | POA: Diagnosis not present

## 2020-07-29 DIAGNOSIS — I4892 Unspecified atrial flutter: Secondary | ICD-10-CM | POA: Diagnosis not present

## 2020-07-29 DIAGNOSIS — K219 Gastro-esophageal reflux disease without esophagitis: Secondary | ICD-10-CM | POA: Diagnosis not present

## 2020-08-01 ENCOUNTER — Other Ambulatory Visit: Payer: Self-pay | Admitting: Nurse Practitioner

## 2020-08-01 DIAGNOSIS — F0390 Unspecified dementia without behavioral disturbance: Secondary | ICD-10-CM

## 2020-08-01 DIAGNOSIS — I480 Paroxysmal atrial fibrillation: Secondary | ICD-10-CM

## 2020-08-01 DIAGNOSIS — I10 Essential (primary) hypertension: Secondary | ICD-10-CM

## 2020-08-03 DIAGNOSIS — I4819 Other persistent atrial fibrillation: Secondary | ICD-10-CM | POA: Diagnosis not present

## 2020-08-03 DIAGNOSIS — M5116 Intervertebral disc disorders with radiculopathy, lumbar region: Secondary | ICD-10-CM | POA: Diagnosis not present

## 2020-08-03 DIAGNOSIS — I110 Hypertensive heart disease with heart failure: Secondary | ICD-10-CM | POA: Diagnosis not present

## 2020-08-03 DIAGNOSIS — E119 Type 2 diabetes mellitus without complications: Secondary | ICD-10-CM | POA: Diagnosis not present

## 2020-08-03 DIAGNOSIS — I4892 Unspecified atrial flutter: Secondary | ICD-10-CM | POA: Diagnosis not present

## 2020-08-03 DIAGNOSIS — I5030 Unspecified diastolic (congestive) heart failure: Secondary | ICD-10-CM | POA: Diagnosis not present

## 2020-08-03 DIAGNOSIS — I2510 Atherosclerotic heart disease of native coronary artery without angina pectoris: Secondary | ICD-10-CM | POA: Diagnosis not present

## 2020-08-03 DIAGNOSIS — K219 Gastro-esophageal reflux disease without esophagitis: Secondary | ICD-10-CM | POA: Diagnosis not present

## 2020-08-03 DIAGNOSIS — I350 Nonrheumatic aortic (valve) stenosis: Secondary | ICD-10-CM | POA: Diagnosis not present

## 2020-08-03 DIAGNOSIS — I080 Rheumatic disorders of both mitral and aortic valves: Secondary | ICD-10-CM | POA: Diagnosis not present

## 2020-08-03 NOTE — Progress Notes (Signed)
Remote pacemaker transmission.   

## 2020-08-04 DIAGNOSIS — I110 Hypertensive heart disease with heart failure: Secondary | ICD-10-CM | POA: Diagnosis not present

## 2020-08-04 DIAGNOSIS — E119 Type 2 diabetes mellitus without complications: Secondary | ICD-10-CM | POA: Diagnosis not present

## 2020-08-04 DIAGNOSIS — K219 Gastro-esophageal reflux disease without esophagitis: Secondary | ICD-10-CM | POA: Diagnosis not present

## 2020-08-04 DIAGNOSIS — I4892 Unspecified atrial flutter: Secondary | ICD-10-CM | POA: Diagnosis not present

## 2020-08-04 DIAGNOSIS — I350 Nonrheumatic aortic (valve) stenosis: Secondary | ICD-10-CM | POA: Diagnosis not present

## 2020-08-04 DIAGNOSIS — I4819 Other persistent atrial fibrillation: Secondary | ICD-10-CM | POA: Diagnosis not present

## 2020-08-04 DIAGNOSIS — I5030 Unspecified diastolic (congestive) heart failure: Secondary | ICD-10-CM | POA: Diagnosis not present

## 2020-08-04 DIAGNOSIS — M5116 Intervertebral disc disorders with radiculopathy, lumbar region: Secondary | ICD-10-CM | POA: Diagnosis not present

## 2020-08-04 DIAGNOSIS — I2510 Atherosclerotic heart disease of native coronary artery without angina pectoris: Secondary | ICD-10-CM | POA: Diagnosis not present

## 2020-08-04 DIAGNOSIS — I080 Rheumatic disorders of both mitral and aortic valves: Secondary | ICD-10-CM | POA: Diagnosis not present

## 2020-08-05 DIAGNOSIS — E119 Type 2 diabetes mellitus without complications: Secondary | ICD-10-CM | POA: Diagnosis not present

## 2020-08-05 DIAGNOSIS — I350 Nonrheumatic aortic (valve) stenosis: Secondary | ICD-10-CM | POA: Diagnosis not present

## 2020-08-05 DIAGNOSIS — I110 Hypertensive heart disease with heart failure: Secondary | ICD-10-CM | POA: Diagnosis not present

## 2020-08-05 DIAGNOSIS — M5116 Intervertebral disc disorders with radiculopathy, lumbar region: Secondary | ICD-10-CM | POA: Diagnosis not present

## 2020-08-05 DIAGNOSIS — I080 Rheumatic disorders of both mitral and aortic valves: Secondary | ICD-10-CM | POA: Diagnosis not present

## 2020-08-05 DIAGNOSIS — I2510 Atherosclerotic heart disease of native coronary artery without angina pectoris: Secondary | ICD-10-CM | POA: Diagnosis not present

## 2020-08-05 DIAGNOSIS — I4819 Other persistent atrial fibrillation: Secondary | ICD-10-CM | POA: Diagnosis not present

## 2020-08-05 DIAGNOSIS — K219 Gastro-esophageal reflux disease without esophagitis: Secondary | ICD-10-CM | POA: Diagnosis not present

## 2020-08-05 DIAGNOSIS — I4892 Unspecified atrial flutter: Secondary | ICD-10-CM | POA: Diagnosis not present

## 2020-08-05 DIAGNOSIS — I5030 Unspecified diastolic (congestive) heart failure: Secondary | ICD-10-CM | POA: Diagnosis not present

## 2020-08-12 DIAGNOSIS — I5030 Unspecified diastolic (congestive) heart failure: Secondary | ICD-10-CM | POA: Diagnosis not present

## 2020-08-12 DIAGNOSIS — K219 Gastro-esophageal reflux disease without esophagitis: Secondary | ICD-10-CM | POA: Diagnosis not present

## 2020-08-12 DIAGNOSIS — I110 Hypertensive heart disease with heart failure: Secondary | ICD-10-CM | POA: Diagnosis not present

## 2020-08-12 DIAGNOSIS — I4819 Other persistent atrial fibrillation: Secondary | ICD-10-CM | POA: Diagnosis not present

## 2020-08-12 DIAGNOSIS — I2510 Atherosclerotic heart disease of native coronary artery without angina pectoris: Secondary | ICD-10-CM | POA: Diagnosis not present

## 2020-08-12 DIAGNOSIS — I350 Nonrheumatic aortic (valve) stenosis: Secondary | ICD-10-CM | POA: Diagnosis not present

## 2020-08-12 DIAGNOSIS — E119 Type 2 diabetes mellitus without complications: Secondary | ICD-10-CM | POA: Diagnosis not present

## 2020-08-12 DIAGNOSIS — I080 Rheumatic disorders of both mitral and aortic valves: Secondary | ICD-10-CM | POA: Diagnosis not present

## 2020-08-12 DIAGNOSIS — I4892 Unspecified atrial flutter: Secondary | ICD-10-CM | POA: Diagnosis not present

## 2020-08-12 DIAGNOSIS — M5116 Intervertebral disc disorders with radiculopathy, lumbar region: Secondary | ICD-10-CM | POA: Diagnosis not present

## 2020-08-19 DIAGNOSIS — I5030 Unspecified diastolic (congestive) heart failure: Secondary | ICD-10-CM | POA: Diagnosis not present

## 2020-08-19 DIAGNOSIS — K219 Gastro-esophageal reflux disease without esophagitis: Secondary | ICD-10-CM | POA: Diagnosis not present

## 2020-08-19 DIAGNOSIS — E119 Type 2 diabetes mellitus without complications: Secondary | ICD-10-CM | POA: Diagnosis not present

## 2020-08-19 DIAGNOSIS — I350 Nonrheumatic aortic (valve) stenosis: Secondary | ICD-10-CM | POA: Diagnosis not present

## 2020-08-19 DIAGNOSIS — I080 Rheumatic disorders of both mitral and aortic valves: Secondary | ICD-10-CM | POA: Diagnosis not present

## 2020-08-19 DIAGNOSIS — I2510 Atherosclerotic heart disease of native coronary artery without angina pectoris: Secondary | ICD-10-CM | POA: Diagnosis not present

## 2020-08-19 DIAGNOSIS — M5116 Intervertebral disc disorders with radiculopathy, lumbar region: Secondary | ICD-10-CM | POA: Diagnosis not present

## 2020-08-19 DIAGNOSIS — I110 Hypertensive heart disease with heart failure: Secondary | ICD-10-CM | POA: Diagnosis not present

## 2020-08-19 DIAGNOSIS — I4892 Unspecified atrial flutter: Secondary | ICD-10-CM | POA: Diagnosis not present

## 2020-08-19 DIAGNOSIS — I4819 Other persistent atrial fibrillation: Secondary | ICD-10-CM | POA: Diagnosis not present

## 2020-08-23 ENCOUNTER — Encounter: Payer: Self-pay | Admitting: Nurse Practitioner

## 2020-08-23 ENCOUNTER — Other Ambulatory Visit: Payer: Self-pay

## 2020-08-23 ENCOUNTER — Ambulatory Visit (INDEPENDENT_AMBULATORY_CARE_PROVIDER_SITE_OTHER): Payer: Medicare HMO | Admitting: Nurse Practitioner

## 2020-08-23 VITALS — BP 112/64 | HR 60 | Temp 96.9°F

## 2020-08-23 DIAGNOSIS — Z0000 Encounter for general adult medical examination without abnormal findings: Secondary | ICD-10-CM | POA: Diagnosis not present

## 2020-08-23 NOTE — Progress Notes (Signed)
Subjective:   Rashi Prevot is a 87 y.o. male who presents for Medicare Annual/Subsequent preventive examination.  Review of Systems           Objective:    Today's Vitals   08/23/20 1551  BP: 112/64  Pulse: 60  Temp: (!) 96.9 F (36.1 C)  TempSrc: Temporal  SpO2: 97%   There is no height or weight on file to calculate BMI.  Advanced Directives 08/23/2020 04/08/2020 07/18/2019 07/07/2019 06/06/2019 05/26/2019 05/14/2019  Does Patient Have a Medical Advance Directive? Yes Yes Yes Yes Yes Yes Yes  Type of Estate agent of [place];Living will Out of facility DNR (pink MOST or yellow form) Healthcare Power of [place];Living will - - Healthcare Power of [place];Living will Healthcare Power of [place];Living will  Does patient want to make changes to medical advance directive? No - Patient declined No - Guardian declined No - Patient declined - - - -  Copy of Healthcare Power of Attorney in Chart? Yes - validated most recent copy scanned in chart (See row information) - Yes - validated most recent copy scanned in chart (See row information) - - - Yes - validated most recent copy scanned in chart (See row information)  Would patient like information on creating a medical advance directive? - - - - - - -    Current Medications (verified) Outpatient Encounter Medications as of 08/23/2020  Medication Sig  . amiodarone (PACERONE) 200 MG tablet Take 1.5 tablets (300 mg total) by mouth daily.  Marland Kitchen amLODipine (NORVASC) 5 MG tablet Take 1 tablet (5 mg total) by mouth daily.  Marland Kitchen amoxicillin (AMOXIL) 500 MG capsule Take 2,000 mg by mouth See admin instructions. Take 2,000 mg by mouth one hour prior to dental appointment  . Cholecalciferol (VITAMIN D3) 50 MCG (2000 UT) TABS Take 2,000 Units by mouth daily with breakfast.  . docusate sodium (COLACE) 100 MG capsule Take 100 mg by mouth daily.  . furosemide (LASIX) 20 MG tablet Take 20 mg daily as needed Sun, Tuesday, Thursday,  and Saturday, will wean down  . gabapentin (NEURONTIN) 100 MG capsule Take 1 capsule (100 mg total) by mouth daily as needed.  . hydrALAZINE (APRESOLINE) 50 MG tablet TAKE ONE TABLET BY MOUTH TWICE DAILY  . losartan (COZAAR) 100 MG tablet Take 1 tablet (100 mg total) by mouth daily.  . Magnesium 400 MG CAPS Take 1 tablet by mouth daily.  . Melatonin 5 MG TABS Take 5 mg by mouth at bedtime as needed (for sleep).   . memantine (NAMENDA) 10 MG tablet TAKE ONE TABLET BY MOUTH TWICE DAILY  . metoprolol succinate (TOPROL-XL) 50 MG 24 hr tablet Take 1 tablet (50 mg total) by mouth daily.  . tamsulosin (FLOMAX) 0.4 MG CAPS capsule Take 1 capsule (0.4 mg total) by mouth daily.  Carlena Hurl 20 MG TABS tablet TAKE ONE TABLET BY MOUTH ONE TIME DAILY with supper   Facility-Administered Encounter Medications as of 08/23/2020  Medication  . sodium chloride flush (NS) 0.9 % injection 3 mL    Allergies (verified) Aricept [donepezil], Tropicamide, and Adhesive [tape]   History: Past Medical History:  Diagnosis Date  . A-fib (HCC)   . Anticoagulated on Coumadin    Per records from [facility]   . Aortic regurgitation   . Aortic stenosis   . CHF (congestive heart failure) (HCC)   . Coronary artery disease   . CVA (cerebral vascular accident) (HCC)    hx of CVA noted  on CT from 02/19/18  . Diabetes mellitus, type 2 ([place])   . Gastro-esophageal reflux disease without esophagitis    Per records from previous provider, [facility]  . Heart attack ([place])   . History of CT scan of head 02/19/2018   Per records from previous provider, [facility]. Chronic changes small vessel disease  . History of ECG    03/26/14- Sinus Tachycardia @ 107 bmp, QRS 78 msec, QT 298 msec, QTc 361 msec. Per records from [place]  . History of Holter monitoring 11/08/2016   Per records from previous provider, [facility]  . Hypertension    . 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 MI (myocardial infarction) [facility])    Per records from [place]   . Mitral regurgitation   . Mitral stenosis   . Other intervertebral disc degeneration, lumbar region    Per records from previous provider, [facility]  . Radiculopathy, lumbar region    Per records from previous provider, [facility] and [facility]  . Sinus bradycardia    Per records from [place]   . Sleep apnea   . Transient ischemic attack    10 to 12 years ago  . Typical atrial flutter ([place])    Per records from [place]    Past Surgical History:  Procedure Laterality Date  . CARDIAC ELECTROPHYSIOLOGY STUDY AND ABLATION    . CARDIOVERSION N/A 12/26/2018   Procedure: CARDIOVERSION;  Surgeon: Pixie Casino, MD;  Location: [facility] ENDOSCOPY;  Service: Cardiovascular;  Laterality: N/A;  . CARDIOVERSION N/A 05/14/2019   Procedure: CARDIOVERSION;  Surgeon: [name], MD;  Location: [facility] ENDOSCOPY;  Service: Cardiovascular;  Laterality: N/A;  . CATARACT EXTRACTION    . Debridement to right arm with MRSA infection     4 years ago  . GALLBLADDER SURGERY     Per records from [place]   . PACEMAKER IMPLANT N/A 07/07/2019   Procedure: PACEMAKER IMPLANT;  Surgeon: [name], MD;  Location: [place] CV LAB;  Service: Cardiovascular;  Laterality: N/A;  . TEE WITHOUT CARDIOVERSION N/A 12/26/2018   Procedure: TRANSESOPHAGEAL ECHOCARDIOGRAM (TEE);  Surgeon: Pixie Casino, MD;  Location: [facility] ENDOSCOPY;  Service: Cardiovascular;  Laterality: N/A;   Family History  Problem Relation Age of Onset  . Heart failure Mother   . Hyperlipidemia Mother   . Hypertension Son   . Hyperlipidemia Son   . Arthritis Son   . Arthritis Daughter   . Hypertension Son    Social History   Socioeconomic History  . Marital  status: Married    Spouse name: Not on file  . Number of children: Not on file  . Years of education: Not on file  . Highest education level: Not on file  Occupational History  . Not on file  Tobacco Use  . Smoking status: Former Smoker    Years: 1.00    Types: Cigarettes, Cigars    Quit date: 07/31/1953    Years since quitting: 67.1  . Smokeless tobacco: Never Used  . Tobacco comment: Quit at age 31  Vaping Use  . Vaping Use: Never used  Substance and Sexual Activity  . Alcohol use: Never  . Drug use: Never  . Sexual activity: Not Currently  Other Topics Concern  . Not  on file  Social History Narrative   Social History      Diet?       Do you drink/eat things with caffeine? yes      Marital status?          married                          What year were you married? 1957      Do you live in a house, apartment, assisted living, condo, trailer, etc.? home      Is it one or more stories? 1      How many persons live in your home? 4      Do you have any pets in your home? (please list) yes- 3 dogs, 1 cat      Highest level of education completed? 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           no                           Type & how often?      Advanced Directives      Do you have a living will? yes      Do you have a DNR form?                                  If not, do you want to discuss one? no      Do you have signed POA/HPOA for forms? yes      Functional Status      Do you have difficulty bathing or dressing yourself? no      Do you have difficulty preparing food or eating? no      Do you have difficulty managing your medications? no      Do you have difficulty managing your finances? no      Do you have difficulty affording your medications? Yes xarelto   Social Determinants of Health   Financial Resource Strain: Not on file  Food Insecurity: Not on file  Transportation Needs: Not  on file  Physical Activity: Not on file  Stress: Not on file  Social Connections: Not on file    Tobacco Counseling Counseling given: Not Answered Comment: Quit at age 88   Clinical Intake:                 Diabetic?yes         Activities of Daily Living No flowsheet data found.  Patient Care Team: [name], NP as PCP - General (Geriatric Medicine) [name], MD as PCP - Cardiology (Cardiology) Center, [facility] (Gastroenterology) [name], MD as Consulting Physician  Indicate any recent Medical Services you may have received from other than [facility] providers in the past year (date may be approximate).     Assessment:   This is a routine wellness examination for [facility].  Hearing/Vision screen  Hearing Screening   125Hz  250Hz  500Hz  1000Hz  2000Hz  3000Hz  4000Hz  6000Hz  8000Hz   Right ear:           Left ear:           Comments: No hearing loss  Vision Screening Comments: Last eye exam in October 2021  Dietary issues and exercise activities discussed:    Goals    . DIET - INCREASE WATER INTAKE     Increase water intake and decrease soda     . Increase physical activity     To increase physical activity to 30 minutes 3 days a week       Depression Screen PHQ 2/9 Scores 08/23/2020 04/08/2020 08/20/2019 11/20/2018 08/12/2018 06/10/2018  PHQ - 2 Score 0 0 0 0 0 0    Fall Risk Fall Risk  08/23/2020 04/08/2020 08/20/2019 07/18/2019 06/06/2019  Falls in the past year? 1 1 0 0 0  Number falls in past yr: 1 0 0 0 -  Injury with Fall? 0 0 0 0 -  Risk for fall due to : History of fall(s) - - - -    FALL RISK PREVENTION PERTAINING TO THE HOME:  Any stairs in or around the home? No  If so, are there any without handrails? No  Home free of loose throw rugs in walkways, pet beds, electrical cords, etc? Yes  Adequate lighting in your home to reduce risk of falls? Yes   ASSISTIVE DEVICES UTILIZED TO PREVENT FALLS:  Life alert? No  Use of a  cane, walker or w/c? No  Grab bars in the bathroom? Yes  Shower chair or bench in shower? Yes  Elevated toilet seat or a handicapped toilet? Yes   TIMED UP AND GO:  Was the test performed? No .   Cognitive Function: MMSE - Mini Mental State Exam 08/12/2018  Orientation to time 4  Orientation to Place 3  Registration 3  Attention/ Calculation 1  Recall 1  Language- name 2 objects 2  Language- repeat 1  Language- follow 3 step command 3  Language- read & follow direction 0  Write a sentence 0  Copy design 0  Total score 18     6CIT Screen 08/23/2020 08/20/2019  What Year? 0 points 0 points  What month? 0 points 0 points  What time? 0 points 0 points  Count back from 20 0 points 0 points  Months in reverse 0 points 4 points  Repeat phrase 2 points 8 points  Total Score 2 12    Immunizations Immunization History  Administered Date(s) Administered  . Fluad Quad(high Dose 65+) 04/18/2019  . Influenza, High Dose Seasonal PF 05/08/2018, 07/19/2020  . Influenza-Unspecified 05/15/2017  . PFIZER(Purple Top)SARS-COV-2 Vaccination 08/14/2019, 09/04/2019, 07/19/2020  . Pneumococcal Polysaccharide-23 07/21/2019  . Tdap 07/31/2013  . Zoster Recombinat (Shingrix) 08/12/2018    TDAP status: Up to date  Flu Vaccine status: Up to date  Completed Pneumococcal 23  Covid-19 vaccine status: Completed vaccines  Qualifies for Shingles Vaccine? Yes   Zostavax completed No   Shingrix Completed?: Yes  Screening Tests Health Maintenance  Topic Date Due  . OPHTHALMOLOGY EXAM  Never done  . HEMOGLOBIN A1C  04/16/2020  . FOOT EXAM  07/17/2020  . PNA vac Low Risk Adult (2 of 2 - PCV13) 07/20/2020  . TETANUS/TDAP  08/01/2023  . INFLUENZA VACCINE  Completed  . COVID-19 Vaccine  Completed    Health Maintenance  Health Maintenance Due  Topic Date Due  . OPHTHALMOLOGY EXAM  Never done  . HEMOGLOBIN A1C  04/16/2020  . FOOT EXAM  07/17/2020  . PNA vac Low Risk Adult (2 of 2 - PCV13)  07/20/2020    Colorectal cancer screening: No longer required.   Lung Cancer Screening: (Low Dose CT Chest recommended if Age 73-80 years, 57  pack-year currently smoking OR have quit w/in 15years.) does not qualify.     Additional Screening:  Hepatitis C Screening: does not qualify;   Vision Screening: Recommended annual ophthalmology exams for early detection of glaucoma and other disorders of the eye. Is the patient up to date with their annual eye exam?  Yes  Who is the provider or what is the name of the office in which the patient attends annual eye exams? [facility]  If pt is not established with a provider, would they like to be referred to a provider to establish care? No .   Dental Screening: Recommended annual dental exams for proper oral hygiene  Community Resource Referral / Chronic Care Management: CRR required this visit?  No   CCM required this visit?  No      Plan:     I have personally reviewed and noted the following in the patient's chart:   . Medical and social history . Use of alcohol, tobacco or illicit drugs  . Current medications and supplements . Functional ability and status . Nutritional status . Physical activity . Advanced directives . List of other physicians . Hospitalizations, surgeries, and ER visits in previous 12 months . Vitals . Screenings to include cognitive, depression, and falls . Referrals and appointments  In addition, I have reviewed and discussed with patient certain preventive protocols, quality metrics, and best practice recommendations. A written personalized care plan for preventive services as well as general preventive health recommendations were provided to patient.     [name], NP   08/23/2020    Virtual Visit via Telephone Note  I connected with on 08/23/20 at  3:45 PM EST by telephone and verified that I am speaking with the correct person using two identifiers.  Location:   I  discussed the limitations, risks, security and privacy concerns of performing an evaluation and management service by telephone and the availability of in person appointments. I also discussed with the patient that there may be a patient responsible charge related to this service. The patient expressed understanding and agreed to proceed.   I discussed the assessment and treatment plan with the patient. The patient was provided an opportunity to ask questions and all were answered. The patient agreed with the plan and demonstrated an understanding of the instructions.   The patient was advised to call back or seek an in-person evaluation if the symptoms worsen or if the condition fails to improve as anticipated.  I provided 18 minutes of non-face-to-face time during this encounter.  Carlos American. Harle Battiest Avs printed and mailed

## 2020-08-23 NOTE — Progress Notes (Signed)
   This service is provided via telemedicine  No vital signs collected/recorded due to the encounter was a telemedicine visit.   Location of patient (ex: home, work):  Home  Patient consents to a telephone visit: Yes, see telephone visit dated 06/29/2020  Location of the provider (ex: office, home):  [facility] Office   Name of any referring provider:  N/A  Names of all persons participating in the telemedicine service and their role in the encounter:  S.Chrae B/CMA, Sherrie Mustache, NP, [name] (daughter) and Patient   Time spent on call:  10 min with medical assistant

## 2020-08-23 NOTE — Patient Instructions (Addendum)
Jose Patel , Thank you for taking time to come for your Medicare Wellness Visit. I appreciate your ongoing commitment to your health goals. Please review the following plan we discussed and let me know if I can assist you in the future.   Screening recommendations/referrals: Colonoscopy aged out Recommended yearly ophthalmology/optometry visit for glaucoma screening and checkup Recommended yearly dental visit for hygiene and checkup  Vaccinations: Influenza vaccine -up to date Pneumococcal vaccine due for prevnar 13 Tdap vaccine up to date Shingles vaccine completed 1 vaccine.     Advanced directives: on file.  Conditions/risks identified: progressive memory loss, cardiovascular disease, advanced age.  Next appointment: year.    Reschedule 6 month follow up (in the next month or so- missed in September)   Preventive Care 7 Years and Older, Male Preventive care refers to lifestyle choices and visits with your health care provider that can promote health and wellness. What does preventive care include?  A yearly physical exam. This is also called an annual well check.  Dental exams once or twice a year.  Routine eye exams. Ask your health care provider how often you should have your eyes checked.  Personal lifestyle choices, including:  Daily care of your teeth and gums.  Regular physical activity.  Eating a healthy diet.  Avoiding tobacco and drug use.  Limiting alcohol use.  Practicing safe sex.  Taking low doses of aspirin every day.  Taking vitamin and mineral supplements as recommended by your health care provider. What happens during an annual well check? The services and screenings done by your health care provider during your annual well check will depend on your age, overall health, lifestyle risk factors, and family history of disease. Counseling  Your health care provider may ask you questions about your:  Alcohol use.  Tobacco use.  Drug  use.  Emotional well-being.  Home and relationship well-being.  Sexual activity.  Eating habits.  History of falls.  Memory and ability to understand (cognition).  Work and work Statistician. Screening  You may have the following tests or measurements:  Height, weight, and BMI.  Blood pressure.  Lipid and cholesterol levels. These may be checked every 5 years, or more frequently if you are over 5 years old.  Skin check.  Lung cancer screening. You may have this screening every year starting at age 48 if you have a 30-pack-year history of smoking and currently smoke or have quit within the past 15 years.  Fecal occult blood test (FOBT) of the stool. You may have this test every year starting at age 49.  Flexible sigmoidoscopy or colonoscopy. You may have a sigmoidoscopy every 5 years or a colonoscopy every 10 years starting at age 30.  Prostate cancer screening. Recommendations will vary depending on your family history and other risks.  Hepatitis C blood test.  Hepatitis B blood test.  Sexually transmitted disease (STD) testing.  Diabetes screening. This is done by checking your blood sugar (glucose) after you have not eaten for a while (fasting). You may have this done every 1-3 years.  Abdominal aortic aneurysm (AAA) screening. You may need this if you are a current or former smoker.  Osteoporosis. You may be screened starting at age 68 if you are at high risk. Talk with your health care provider about your test results, treatment options, and if necessary, the need for more tests. Vaccines  Your health care provider may recommend certain vaccines, such as:  Influenza vaccine. This is recommended every year.  Tetanus, diphtheria, and acellular pertussis (Tdap, Td) vaccine. You may need a Td booster every 10 years.  Zoster vaccine. You may need this after age 11.  Pneumococcal 13-valent conjugate (PCV13) vaccine. One dose is recommended after age  6.  Pneumococcal polysaccharide (PPSV23) vaccine. One dose is recommended after age 69. Talk to your health care provider about which screenings and vaccines you need and how often you need them. This information is not intended to replace advice given to you by your health care provider. Make sure you discuss any questions you have with your health care provider. Document Released: 08/13/2015 Document Revised: 04/05/2016 Document Reviewed: 05/18/2015 Elsevier Interactive Patient Education  2017 [place] Prevention in the Home Falls can cause injuries. They can happen to people of all ages. There are many things you can do to make your home safe and to help prevent falls. What can I do on the outside of my home?  Regularly fix the edges of walkways and driveways and fix any cracks.  Remove anything that might make you trip as you walk through a door, such as a raised step or threshold.  Trim any bushes or trees on the path to your home.  Use bright outdoor lighting.  Clear any walking paths of anything that might make someone trip, such as rocks or tools.  Regularly check to see if handrails are loose or broken. Make sure that both sides of any steps have handrails.  Any raised decks and porches should have guardrails on the edges.  Have any leaves, snow, or ice cleared regularly.  Use sand or salt on walking paths during winter.  Clean up any spills in your garage right away. This includes oil or grease spills. What can I do in the bathroom?  Use night lights.  Install grab bars by the toilet and in the tub and shower. Do not use towel bars as grab bars.  Use non-skid mats or decals in the tub or shower.  If you need to sit down in the shower, use a plastic, non-slip stool.  Keep the floor dry. Clean up any water that spills on the floor as soon as it happens.  Remove soap buildup in the tub or shower regularly.  Attach bath mats securely with double-sided  non-slip rug tape.  Do not have throw rugs and other things on the floor that can make you trip. What can I do in the bedroom?  Use night lights.  Make sure that you have a light by your bed that is easy to reach.  Do not use any sheets or blankets that are too big for your bed. They should not hang down onto the floor.  Have a firm chair that has side arms. You can use this for support while you get dressed.  Do not have throw rugs and other things on the floor that can make you trip. What can I do in the kitchen?  Clean up any spills right away.  Avoid walking on wet floors.  Keep items that you use a lot in easy-to-reach places.  If you need to reach something above you, use a strong step stool that has a grab bar.  Keep electrical cords out of the way.  Do not use floor polish or wax that makes floors slippery. If you must use wax, use non-skid floor wax.  Do not have throw rugs and other things on the floor that can make you trip. What can I do  with my stairs?  Do not leave any items on the stairs.  Make sure that there are handrails on both sides of the stairs and use them. Fix handrails that are broken or loose. Make sure that handrails are as long as the stairways.  Check any carpeting to make sure that it is firmly attached to the stairs. Fix any carpet that is loose or worn.  Avoid having throw rugs at the top or bottom of the stairs. If you do have throw rugs, attach them to the floor with carpet tape.  Make sure that you have a light switch at the top of the stairs and the bottom of the stairs. If you do not have them, ask someone to add them for you. What else can I do to help prevent falls?  Wear shoes that:  Do not have high heels.  Have rubber bottoms.  Are comfortable and fit you well.  Are closed at the toe. Do not wear sandals.  If you use a stepladder:  Make sure that it is fully opened. Do not climb a closed stepladder.  Make sure that both  sides of the stepladder are locked into place.  Ask someone to hold it for you, if possible.  Clearly mark and make sure that you can see:  Any grab bars or handrails.  First and last steps.  Where the edge of each step is.  Use tools that help you move around (mobility aids) if they are needed. These include:  Canes.  Walkers.  Scooters.  Crutches.  Turn on the lights when you go into a dark area. Replace any light bulbs as soon as they burn out.  Set up your furniture so you have a clear path. Avoid moving your furniture around.  If any of your floors are uneven, fix them.  If there are any pets around you, be aware of where they are.  Review your medicines with your doctor. Some medicines can make you feel dizzy. This can increase your chance of falling. Ask your doctor what other things that you can do to help prevent falls. This information is not intended to replace advice given to you by your health care provider. Make sure you discuss any questions you have with your health care provider. Document Released: 05/13/2009 Document Revised: 12/23/2015 Document Reviewed: 08/21/2014 Elsevier Interactive Patient Education  2017 [employer].

## 2020-08-24 DIAGNOSIS — I4892 Unspecified atrial flutter: Secondary | ICD-10-CM | POA: Diagnosis not present

## 2020-08-24 DIAGNOSIS — I4819 Other persistent atrial fibrillation: Secondary | ICD-10-CM | POA: Diagnosis not present

## 2020-08-24 DIAGNOSIS — I2510 Atherosclerotic heart disease of native coronary artery without angina pectoris: Secondary | ICD-10-CM | POA: Diagnosis not present

## 2020-08-24 DIAGNOSIS — I350 Nonrheumatic aortic (valve) stenosis: Secondary | ICD-10-CM | POA: Diagnosis not present

## 2020-08-24 DIAGNOSIS — K219 Gastro-esophageal reflux disease without esophagitis: Secondary | ICD-10-CM | POA: Diagnosis not present

## 2020-08-24 DIAGNOSIS — I080 Rheumatic disorders of both mitral and aortic valves: Secondary | ICD-10-CM | POA: Diagnosis not present

## 2020-08-24 DIAGNOSIS — I110 Hypertensive heart disease with heart failure: Secondary | ICD-10-CM | POA: Diagnosis not present

## 2020-08-24 DIAGNOSIS — E119 Type 2 diabetes mellitus without complications: Secondary | ICD-10-CM | POA: Diagnosis not present

## 2020-08-24 DIAGNOSIS — M5116 Intervertebral disc disorders with radiculopathy, lumbar region: Secondary | ICD-10-CM | POA: Diagnosis not present

## 2020-08-24 DIAGNOSIS — I5030 Unspecified diastolic (congestive) heart failure: Secondary | ICD-10-CM | POA: Diagnosis not present

## 2020-09-14 ENCOUNTER — Encounter (INDEPENDENT_AMBULATORY_CARE_PROVIDER_SITE_OTHER): Payer: Self-pay

## 2020-09-25 IMAGING — DX DG CHEST 2V
2 series · 2 of 2 positions shown · non-contrast
Comparison: Chest x-ray 03/13/2019.

CLINICAL DATA: 85-year-old male with history of chest pain and
palpitations.

EXAM:
CHEST - 2 VIEW

[chest pa]
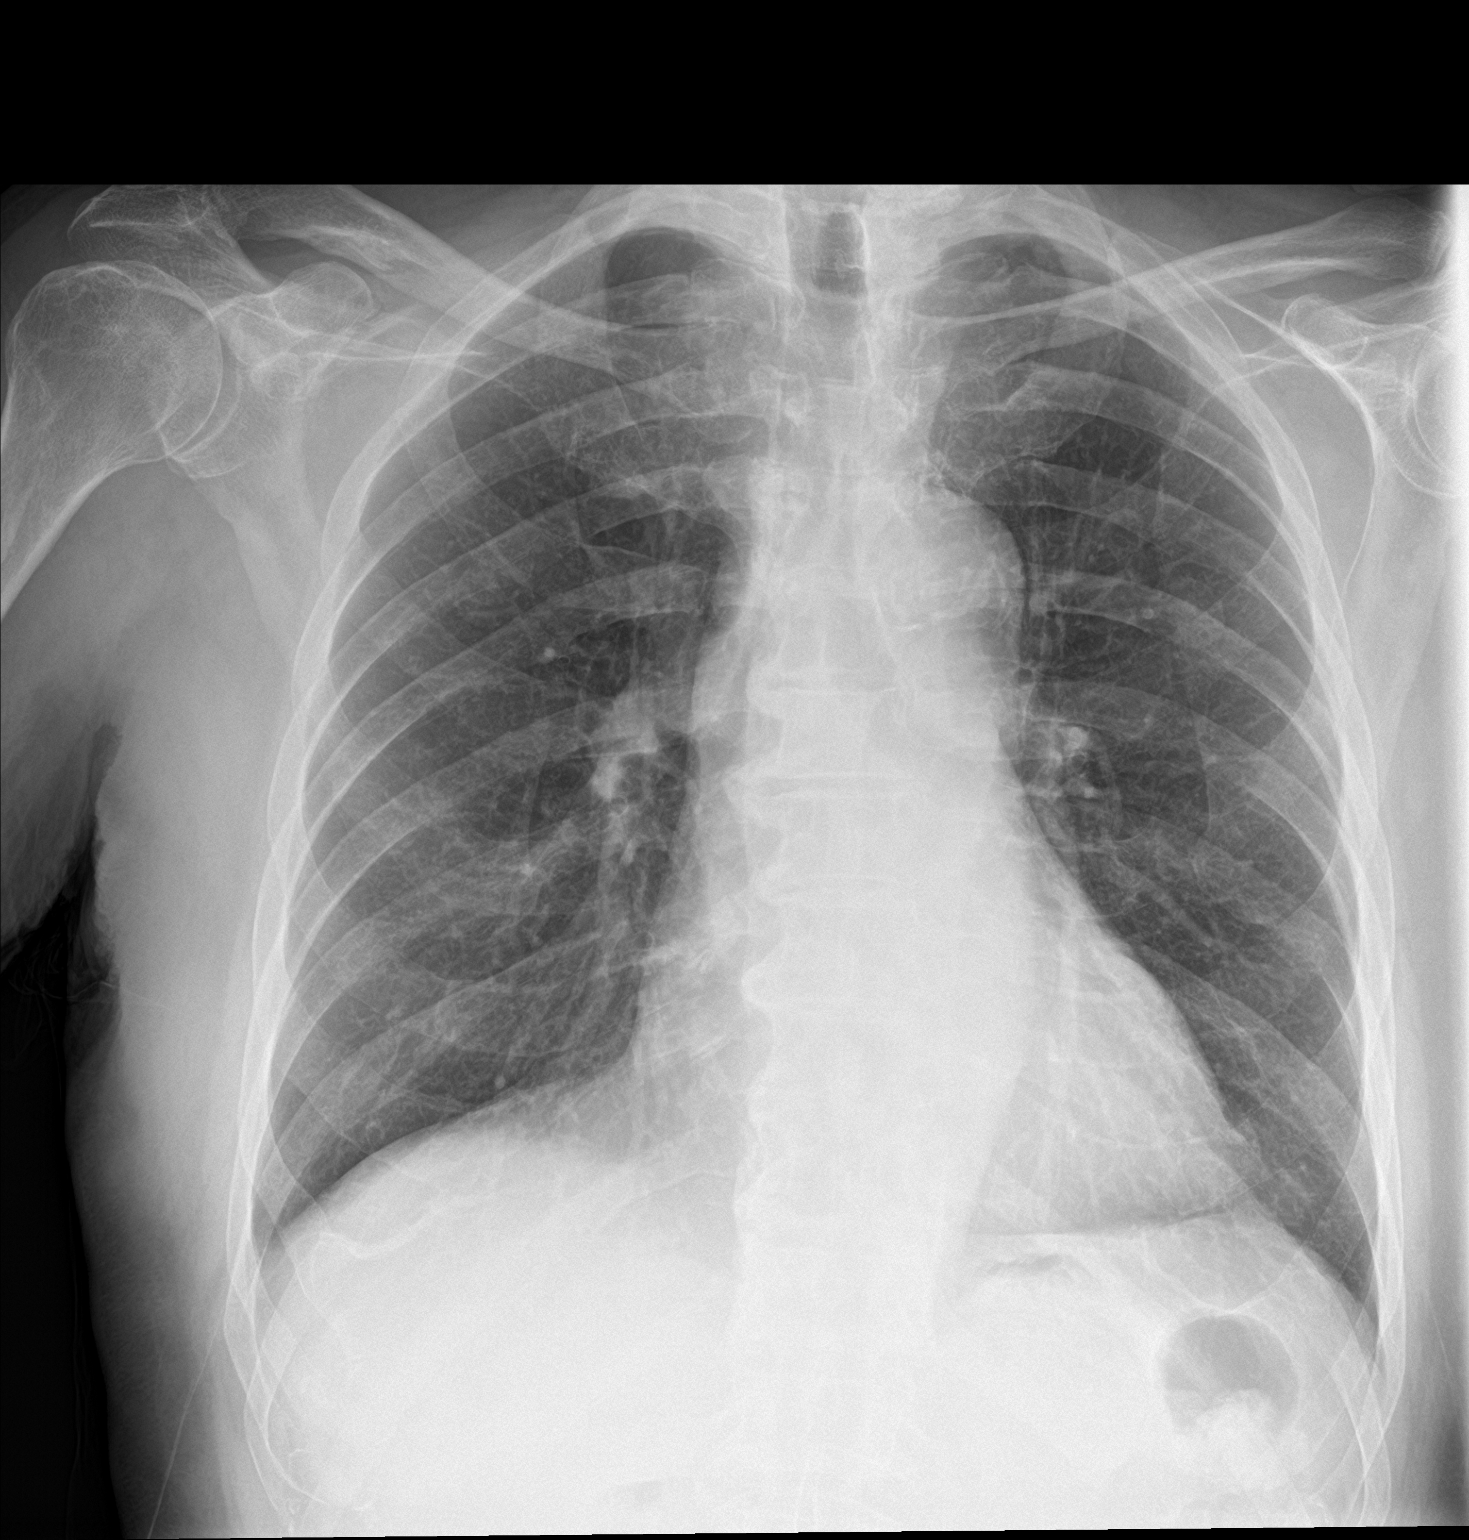

[chest lat]
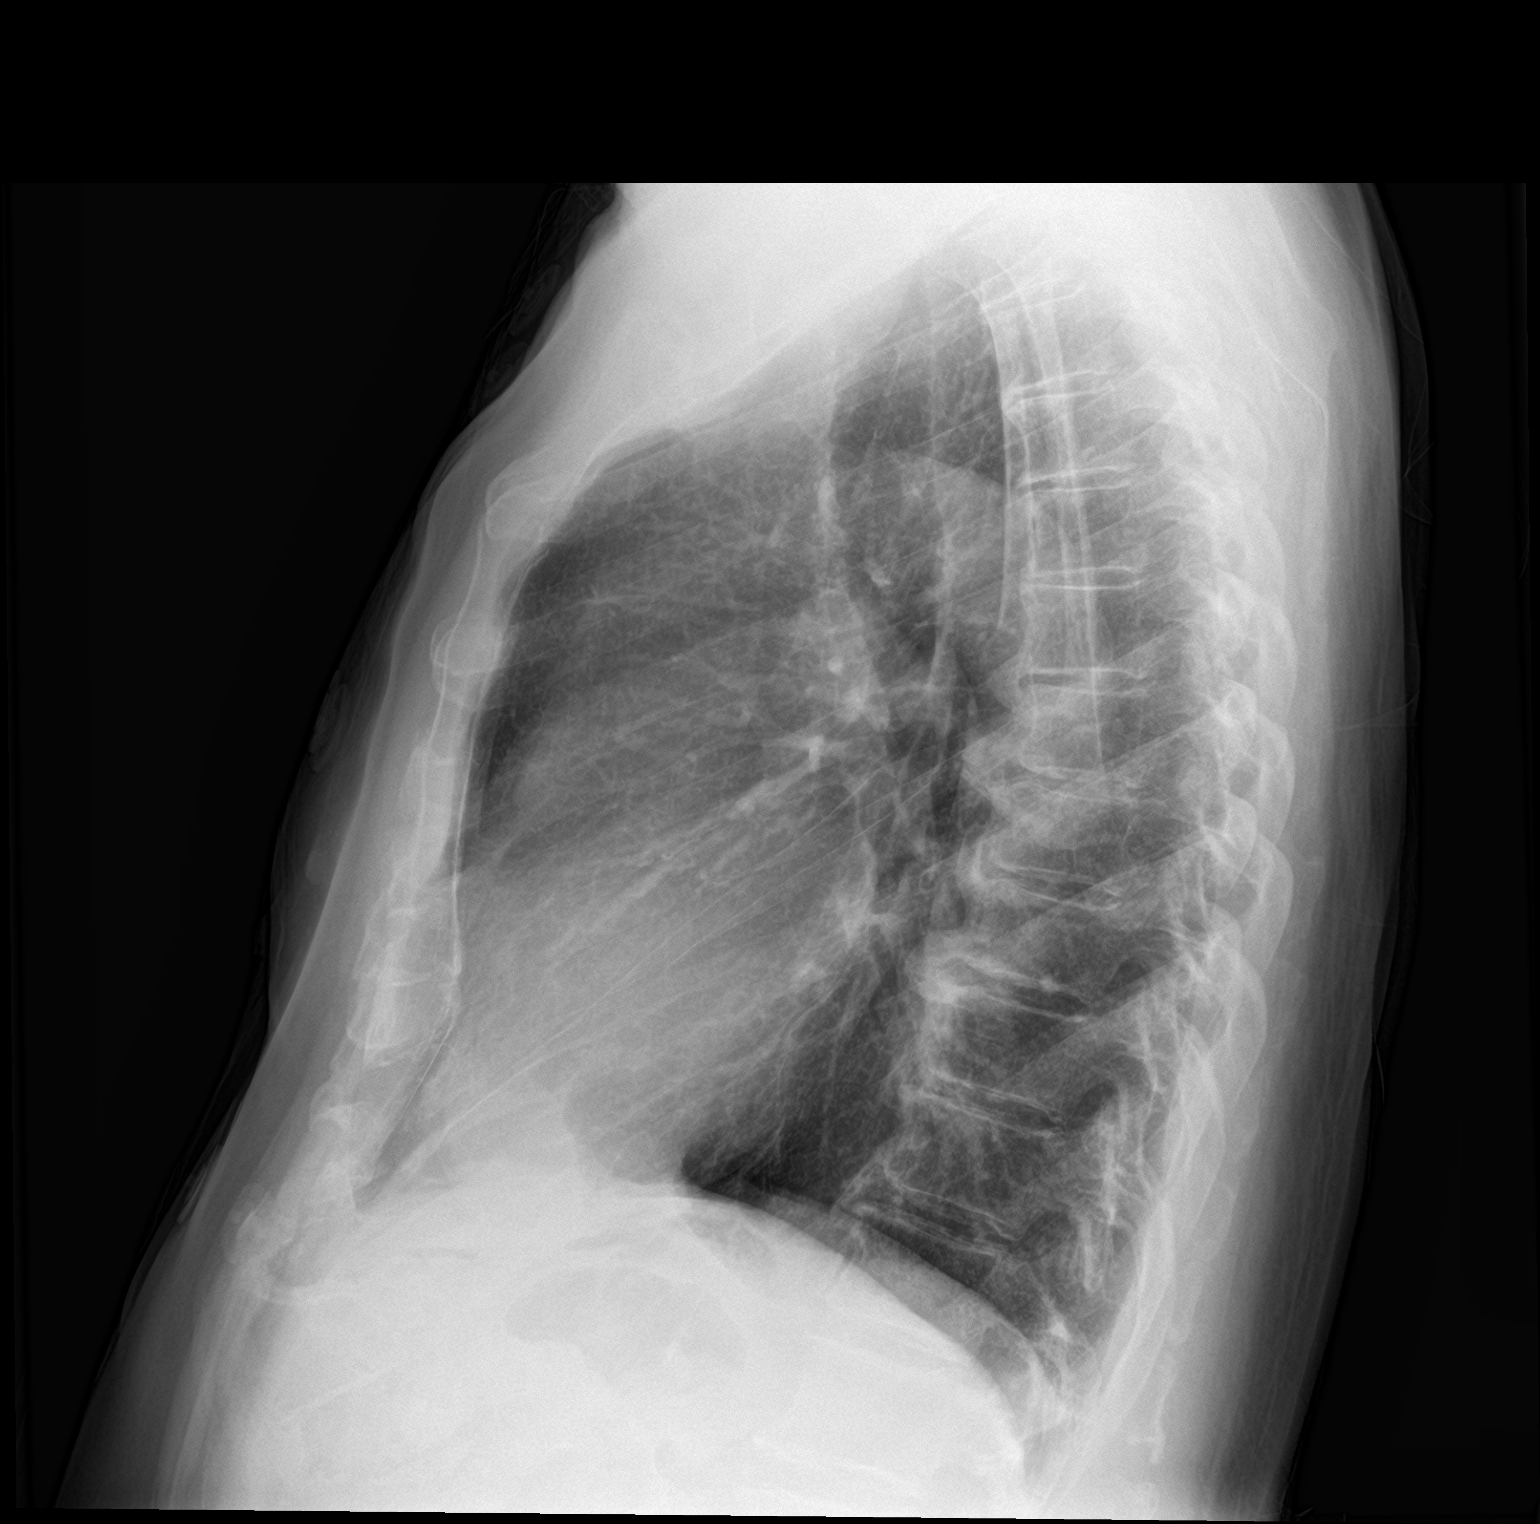

[2 of 2 positions shown; findings below may reference images not displayed]

FINDINGS: Lung volumes are normal. No consolidative airspace disease. No
pleural effusions. No pneumothorax. No pulmonary nodule or mass
noted. Pulmonary vasculature and the cardiomediastinal silhouette
are within normal limits. Atherosclerosis in the thoracic aorta.
IMPRESSION: 1.  No radiographic evidence of acute cardiopulmonary disease.
2. Aortic atherosclerosis.

## 2020-10-11 DIAGNOSIS — M3501 Sicca syndrome with keratoconjunctivitis: Secondary | ICD-10-CM | POA: Diagnosis not present

## 2020-10-11 DIAGNOSIS — H35372 Puckering of macula, left eye: Secondary | ICD-10-CM | POA: Diagnosis not present

## 2020-10-11 DIAGNOSIS — Z961 Presence of intraocular lens: Secondary | ICD-10-CM | POA: Diagnosis not present

## 2020-10-11 DIAGNOSIS — S0501XA Injury of conjunctiva and corneal abrasion without foreign body, right eye, initial encounter: Secondary | ICD-10-CM | POA: Diagnosis not present

## 2020-10-14 LAB — CUP PACEART REMOTE DEVICE CHECK
Battery Remaining Longevity: 143 mo
Battery Voltage: 3.02 V
Brady Statistic AP VP Percent: 0.16 %
Brady Statistic AP VS Percent: 83.64 %
Brady Statistic AS VP Percent: 0.09 %
Brady Statistic AS VS Percent: 16.11 %
Brady Statistic RA Percent Paced: 82.56 %
Brady Statistic RV Percent Paced: 0.25 %
Date Time Interrogation Session: 20220315201241
Implantable Lead Implant Date: 20201207
Implantable Lead Implant Date: 20201207
Implantable Lead Location: 753859
Implantable Lead Location: 753860
Implantable Lead Model: 5076
Implantable Lead Model: 5076
Implantable Pulse Generator Implant Date: 20201207
Lead Channel Impedance Value: 323 Ohm
Lead Channel Impedance Value: 399 Ohm
Lead Channel Impedance Value: 437 Ohm
Lead Channel Impedance Value: 494 Ohm
Lead Channel Pacing Threshold Amplitude: 0.875 V
Lead Channel Pacing Threshold Amplitude: 1.125 V
Lead Channel Pacing Threshold Pulse Width: 0.4 ms
Lead Channel Pacing Threshold Pulse Width: 0.4 ms
Lead Channel Sensing Intrinsic Amplitude: 2.5 mV
Lead Channel Sensing Intrinsic Amplitude: 2.5 mV
Lead Channel Sensing Intrinsic Amplitude: 28.25 mV
Lead Channel Sensing Intrinsic Amplitude: 28.25 mV
Lead Channel Setting Pacing Amplitude: 1.75 V
Lead Channel Setting Pacing Amplitude: 2.5 V
Lead Channel Setting Pacing Pulse Width: 0.4 ms
Lead Channel Setting Sensing Sensitivity: 1.2 mV

## 2020-10-18 ENCOUNTER — Ambulatory Visit (INDEPENDENT_AMBULATORY_CARE_PROVIDER_SITE_OTHER): Payer: Medicare HMO

## 2020-10-18 DIAGNOSIS — I495 Sick sinus syndrome: Secondary | ICD-10-CM | POA: Diagnosis not present

## 2020-10-18 LAB — CUP PACEART REMOTE DEVICE CHECK
Battery Remaining Longevity: 144 mo
Battery Voltage: 3.02 V
Brady Statistic AP VP Percent: 0.04 %
Brady Statistic AP VS Percent: 99.94 %
Brady Statistic AS VP Percent: 0 %
Brady Statistic AS VS Percent: 0.02 %
Brady Statistic RA Percent Paced: 99.98 %
Brady Statistic RV Percent Paced: 0.04 %
Date Time Interrogation Session: 20220321095617
Implantable Lead Implant Date: 20201207
Implantable Lead Implant Date: 20201207
Implantable Lead Location: 753859
Implantable Lead Location: 753860
Implantable Lead Model: 5076
Implantable Lead Model: 5076
Implantable Pulse Generator Implant Date: 20201207
Lead Channel Impedance Value: 323 Ohm
Lead Channel Impedance Value: 399 Ohm
Lead Channel Impedance Value: 475 Ohm
Lead Channel Impedance Value: 475 Ohm
Lead Channel Pacing Threshold Amplitude: 0.875 V
Lead Channel Pacing Threshold Amplitude: 1.25 V
Lead Channel Pacing Threshold Pulse Width: 0.4 ms
Lead Channel Pacing Threshold Pulse Width: 0.4 ms
Lead Channel Sensing Intrinsic Amplitude: 2.5 mV
Lead Channel Sensing Intrinsic Amplitude: 2.5 mV
Lead Channel Sensing Intrinsic Amplitude: 26.375 mV
Lead Channel Sensing Intrinsic Amplitude: 26.375 mV
Lead Channel Setting Pacing Amplitude: 1.75 V
Lead Channel Setting Pacing Amplitude: 2.5 V
Lead Channel Setting Pacing Pulse Width: 0.4 ms
Lead Channel Setting Sensing Sensitivity: 1.2 mV

## 2020-10-21 ENCOUNTER — Other Ambulatory Visit: Payer: Self-pay | Admitting: Nurse Practitioner

## 2020-10-21 DIAGNOSIS — I10 Essential (primary) hypertension: Secondary | ICD-10-CM

## 2020-10-26 NOTE — Progress Notes (Signed)
Remote pacemaker transmission.   

## 2020-10-27 ENCOUNTER — Other Ambulatory Visit: Payer: Self-pay | Admitting: Nurse Practitioner

## 2020-10-27 DIAGNOSIS — N401 Enlarged prostate with lower urinary tract symptoms: Secondary | ICD-10-CM

## 2020-10-27 DIAGNOSIS — F0390 Unspecified dementia without behavioral disturbance: Secondary | ICD-10-CM

## 2020-10-27 DIAGNOSIS — I10 Essential (primary) hypertension: Secondary | ICD-10-CM

## 2020-10-27 DIAGNOSIS — I480 Paroxysmal atrial fibrillation: Secondary | ICD-10-CM

## 2020-10-27 DIAGNOSIS — R350 Frequency of micturition: Secondary | ICD-10-CM

## 2020-11-01 ENCOUNTER — Ambulatory Visit (INDEPENDENT_AMBULATORY_CARE_PROVIDER_SITE_OTHER): Payer: Medicare HMO | Admitting: Cardiovascular Disease

## 2020-11-01 ENCOUNTER — Encounter: Payer: Self-pay | Admitting: Cardiovascular Disease

## 2020-11-01 ENCOUNTER — Other Ambulatory Visit: Payer: Self-pay | Admitting: Nurse Practitioner

## 2020-11-01 ENCOUNTER — Other Ambulatory Visit: Payer: Self-pay

## 2020-11-01 VITALS — BP 107/55 | HR 63 | Ht 70.0 in | Wt 178.4 lb

## 2020-11-01 DIAGNOSIS — I5042 Chronic combined systolic (congestive) and diastolic (congestive) heart failure: Secondary | ICD-10-CM

## 2020-11-01 DIAGNOSIS — Z7901 Long term (current) use of anticoagulants: Secondary | ICD-10-CM | POA: Diagnosis not present

## 2020-11-01 DIAGNOSIS — I480 Paroxysmal atrial fibrillation: Secondary | ICD-10-CM | POA: Diagnosis not present

## 2020-11-01 DIAGNOSIS — Z5181 Encounter for therapeutic drug level monitoring: Secondary | ICD-10-CM

## 2020-11-01 DIAGNOSIS — I77810 Thoracic aortic ectasia: Secondary | ICD-10-CM | POA: Diagnosis not present

## 2020-11-01 DIAGNOSIS — I351 Nonrheumatic aortic (valve) insufficiency: Secondary | ICD-10-CM

## 2020-11-01 DIAGNOSIS — G4733 Obstructive sleep apnea (adult) (pediatric): Secondary | ICD-10-CM

## 2020-11-01 DIAGNOSIS — Z79899 Other long term (current) drug therapy: Secondary | ICD-10-CM

## 2020-11-01 DIAGNOSIS — R5383 Other fatigue: Secondary | ICD-10-CM

## 2020-11-01 DIAGNOSIS — I495 Sick sinus syndrome: Secondary | ICD-10-CM | POA: Diagnosis not present

## 2020-11-01 DIAGNOSIS — N401 Enlarged prostate with lower urinary tract symptoms: Secondary | ICD-10-CM

## 2020-11-01 DIAGNOSIS — Z950 Presence of cardiac pacemaker: Secondary | ICD-10-CM

## 2020-11-01 DIAGNOSIS — Z8679 Personal history of other diseases of the circulatory system: Secondary | ICD-10-CM

## 2020-11-01 DIAGNOSIS — I471 Supraventricular tachycardia: Secondary | ICD-10-CM | POA: Diagnosis not present

## 2020-11-01 DIAGNOSIS — I10 Essential (primary) hypertension: Secondary | ICD-10-CM | POA: Diagnosis not present

## 2020-11-01 DIAGNOSIS — E7800 Pure hypercholesterolemia, unspecified: Secondary | ICD-10-CM

## 2020-11-01 LAB — TSH: TSH: 1.8 u[IU]/mL (ref 0.450–4.500)

## 2020-11-01 LAB — COMPREHENSIVE METABOLIC PANEL
ALT: 51 IU/L — ABNORMAL HIGH (ref 0–44)
AST: 31 IU/L (ref 0–40)
Albumin/Globulin Ratio: 1.2 (ref 1.2–2.2)
Albumin: 3.7 g/dL (ref 3.6–4.6)
Alkaline Phosphatase: 103 IU/L (ref 44–121)
BUN/Creatinine Ratio: 20 (ref 10–24)
BUN: 22 mg/dL (ref 8–27)
Bilirubin Total: 0.6 mg/dL (ref 0.0–1.2)
CO2: 25 mmol/L (ref 20–29)
Calcium: 8.4 mg/dL — ABNORMAL LOW (ref 8.6–10.2)
Chloride: 104 mmol/L (ref 96–106)
Creatinine, Ser: 1.11 mg/dL (ref 0.76–1.27)
Globulin, Total: 3 g/dL (ref 1.5–4.5)
Glucose: 106 mg/dL — ABNORMAL HIGH (ref 65–99)
Potassium: 4.1 mmol/L (ref 3.5–5.2)
Sodium: 142 mmol/L (ref 134–144)
Total Protein: 6.7 g/dL (ref 6.0–8.5)
eGFR: 65 mL/min/{1.73_m2} (ref >59)

## 2020-11-01 LAB — CBC
Hematocrit: 42.2 % (ref 37.5–51.0)
Hemoglobin: 14 g/dL (ref 13.0–17.7)
MCH: 31.3 pg (ref 26.6–33.0)
MCHC: 33.2 g/dL (ref 31.5–35.7)
MCV: 94 fL (ref 79–97)
Platelets: 203 10*3/uL (ref 150–450)
RBC: 4.48 x10E6/uL (ref 4.14–5.80)
RDW: 13.5 % (ref 11.6–15.4)
WBC: 5.6 10*3/uL (ref 3.4–10.8)

## 2020-11-01 LAB — PACEMAKER DEVICE OBSERVATION

## 2020-11-01 LAB — LIPID PANEL
Chol/HDL Ratio: 5.4 ratio — ABNORMAL HIGH (ref 0.0–5.0)
Cholesterol, Total: 179 mg/dL (ref 100–199)
HDL: 33 mg/dL — ABNORMAL LOW (ref >39)
LDL Chol Calc (NIH): 124 mg/dL — ABNORMAL HIGH (ref 0–99)
Triglycerides: 122 mg/dL (ref 0–149)
VLDL Cholesterol Cal: 22 mg/dL (ref 5–40)

## 2020-11-01 MED ORDER — AMIODARONE HCL 200 MG PO TABS: 200.0000 mg | ORAL_TABLET | Freq: Every day | ORAL | 1 refills | Status: DC

## 2020-11-01 NOTE — Patient Instructions (Signed)
Medication Instructions:  STOP the Amlodipine DECREASE the Amiodarone to 200 mg once daily   *If you need a refill on your cardiac medications before your next appointment, please call your pharmacy*   Lab Work: Your provider would like for you to have the following labs today: CMET, TSH, CBC, and Lipid  If you have labs (blood work) drawn today and your tests are completely normal, you will receive your results only by: Marland Kitchen MyChart Message (if you have MyChart) OR . A paper copy in the mail If you have any lab test that is abnormal or we need to change your treatment, we will call you to review the results.   Testing/Procedures: None ordered   Follow-Up: At [facility], you and your health needs are our priority.  As part of our continuing mission to provide you with exceptional heart care, we have created designated Provider Care Teams.  These Care Teams include your primary Cardiologist (physician) and Advanced Practice Providers (APPs -  Physician Assistants and Nurse Practitioners) who all work together to provide you with the care you need, when you need it.  We recommend signing up for the patient portal called "MyChart".  Sign up information is provided on this After Visit Summary.  MyChart is used to connect with patients for Virtual Visits (Telemedicine).  Patients are able to view lab/test results, encounter notes, upcoming appointments, etc.  Non-urgent messages can be sent to your provider as well.   To learn more about what you can do with MyChart, go to [email or web address].    Your next appointment:   12 month(s)  The format for your next appointment:   In Person  Provider:   You may see [name], MD or one of the following Advanced Practice Providers on your designated Care Team:    Almyra Deforest, PA-C  Fabian Sharp, PA-C or   Roby Lofts, [place]

## 2020-11-04 ENCOUNTER — Encounter: Payer: Self-pay | Admitting: Nurse Practitioner

## 2020-11-04 ENCOUNTER — Encounter: Payer: Self-pay | Admitting: Cardiovascular Disease

## 2020-11-04 DIAGNOSIS — N401 Enlarged prostate with lower urinary tract symptoms: Secondary | ICD-10-CM

## 2020-11-04 MED ORDER — TAMSULOSIN HCL 0.4 MG PO CAPS: 0.4000 mg | ORAL_CAPSULE | Freq: Every day | ORAL | 0 refills | Status: DC
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
## 2020-11-15 DIAGNOSIS — Z0100 Encounter for examination of eyes and vision without abnormal findings: Secondary | ICD-10-CM | POA: Diagnosis not present

## 2020-11-15 DIAGNOSIS — Z961 Presence of intraocular lens: Secondary | ICD-10-CM | POA: Diagnosis not present

## 2020-11-15 DIAGNOSIS — H35372 Puckering of macula, left eye: Secondary | ICD-10-CM | POA: Diagnosis not present

## 2020-11-15 DIAGNOSIS — M3501 Sicca syndrome with keratoconjunctivitis: Secondary | ICD-10-CM | POA: Diagnosis not present

## 2020-11-15 DIAGNOSIS — S0501XA Injury of conjunctiva and corneal abrasion without foreign body, right eye, initial encounter: Secondary | ICD-10-CM | POA: Diagnosis not present

## 2021-01-17 ENCOUNTER — Ambulatory Visit (INDEPENDENT_AMBULATORY_CARE_PROVIDER_SITE_OTHER): Payer: Medicare HMO

## 2021-01-17 DIAGNOSIS — I495 Sick sinus syndrome: Secondary | ICD-10-CM

## 2021-01-17 LAB — CUP PACEART REMOTE DEVICE CHECK
Battery Remaining Longevity: 141 mo
Battery Voltage: 3.02 V
Brady Statistic AP VP Percent: 0.05 %
Brady Statistic AP VS Percent: 99.87 %
Brady Statistic AS VP Percent: 0 %
Brady Statistic AS VS Percent: 0.08 %
Brady Statistic RA Percent Paced: 99.91 %
Brady Statistic RV Percent Paced: 0.05 %
Date Time Interrogation Session: 20220619234554
Implantable Lead Implant Date: 20201207
Implantable Lead Implant Date: 20201207
Implantable Lead Location: 753859
Implantable Lead Location: 753860
Implantable Lead Model: 5076
Implantable Lead Model: 5076
Implantable Pulse Generator Implant Date: 20201207
Lead Channel Impedance Value: 323 Ohm
Lead Channel Impedance Value: 380 Ohm
Lead Channel Impedance Value: 475 Ohm
Lead Channel Impedance Value: 475 Ohm
Lead Channel Pacing Threshold Amplitude: 0.875 V
Lead Channel Pacing Threshold Amplitude: 1.25 V
Lead Channel Pacing Threshold Pulse Width: 0.4 ms
Lead Channel Pacing Threshold Pulse Width: 0.4 ms
Lead Channel Sensing Intrinsic Amplitude: 2.5 mV
Lead Channel Sensing Intrinsic Amplitude: 2.5 mV
Lead Channel Sensing Intrinsic Amplitude: 27.5 mV
Lead Channel Sensing Intrinsic Amplitude: 27.5 mV
Lead Channel Setting Pacing Amplitude: 1.75 V
Lead Channel Setting Pacing Amplitude: 2.75 V
Lead Channel Setting Pacing Pulse Width: 0.4 ms
Lead Channel Setting Sensing Sensitivity: 1.2 mV

## 2021-02-01 ENCOUNTER — Other Ambulatory Visit: Payer: Self-pay | Admitting: Nurse Practitioner

## 2021-02-01 DIAGNOSIS — I10 Essential (primary) hypertension: Secondary | ICD-10-CM

## 2021-02-01 DIAGNOSIS — F0390 Unspecified dementia without behavioral disturbance: Secondary | ICD-10-CM

## 2021-02-01 DIAGNOSIS — I480 Paroxysmal atrial fibrillation: Secondary | ICD-10-CM

## 2021-02-01 DIAGNOSIS — N401 Enlarged prostate with lower urinary tract symptoms: Secondary | ICD-10-CM

## 2021-02-02 MED ORDER — TAMSULOSIN HCL 0.4 MG PO CAPS: 0.4000 mg | ORAL_CAPSULE | Freq: Every day | ORAL | 0 refills | Status: DC

## 2021-02-02 MED ORDER — MEMANTINE HCL 10 MG PO TABS: 10.0000 mg | ORAL_TABLET | Freq: Two times a day (BID) | ORAL | 0 refills | Status: DC

## 2021-02-02 MED ORDER — RIVAROXABAN 20 MG PO TABS: 20.0000 mg | ORAL_TABLET | Freq: Every day | ORAL | 0 refills | Status: DC

## 2021-02-02 MED ORDER — LOSARTAN POTASSIUM 100 MG PO TABS: 100.0000 mg | ORAL_TABLET | Freq: Every day | ORAL | 0 refills | Status: DC

## 2021-02-02 MED ORDER — HYDRALAZINE HCL 50 MG PO TABS: 50.0000 mg | ORAL_TABLET | Freq: Two times a day (BID) | ORAL | 0 refills | Status: DC

## 2021-02-02 NOTE — Telephone Encounter (Signed)
Would like clarification as to why these were sent to me?

## 2021-02-02 NOTE — Telephone Encounter (Signed)
Noted  

## 2021-02-02 NOTE — Telephone Encounter (Signed)
Patient medication "Xarelto" is asking for directions/edits. Medication pend and sent to PCP Dewaine Oats Carlos American, NP for edits. Please Advise.

## 2021-02-04 NOTE — Progress Notes (Signed)
Remote pacemaker transmission.   

## 2021-02-25 ENCOUNTER — Other Ambulatory Visit: Payer: Self-pay

## 2021-02-25 ENCOUNTER — Encounter: Payer: Self-pay | Admitting: Nurse Practitioner

## 2021-02-25 ENCOUNTER — Ambulatory Visit (INDEPENDENT_AMBULATORY_CARE_PROVIDER_SITE_OTHER): Payer: Medicare HMO | Admitting: Nurse Practitioner

## 2021-02-25 VITALS — BP 144/78 | HR 76 | Temp 97.5°F | Ht 70.0 in | Wt 178.0 lb

## 2021-02-25 DIAGNOSIS — I5030 Unspecified diastolic (congestive) heart failure: Secondary | ICD-10-CM

## 2021-02-25 DIAGNOSIS — B372 Candidiasis of skin and nail: Secondary | ICD-10-CM

## 2021-02-25 DIAGNOSIS — N401 Enlarged prostate with lower urinary tract symptoms: Secondary | ICD-10-CM | POA: Diagnosis not present

## 2021-02-25 DIAGNOSIS — E1140 Type 2 diabetes mellitus with diabetic neuropathy, unspecified: Secondary | ICD-10-CM | POA: Diagnosis not present

## 2021-02-25 DIAGNOSIS — I10 Essential (primary) hypertension: Secondary | ICD-10-CM

## 2021-02-25 DIAGNOSIS — R350 Frequency of micturition: Secondary | ICD-10-CM

## 2021-02-25 DIAGNOSIS — I480 Paroxysmal atrial fibrillation: Secondary | ICD-10-CM

## 2021-02-25 MED ORDER — NYSTATIN 100000 UNIT/GM EX CREA: 1.0000 "application " | TOPICAL_CREAM | Freq: Two times a day (BID) | CUTANEOUS | 0 refills | Status: DC

## 2021-02-25 NOTE — Progress Notes (Signed)
Careteam: Patient Care Team: [name], NP as PCP - General (Geriatric Medicine) [name], MD as PCP - Cardiology (Cardiology) Center, [facility] (Gastroenterology) [name], MD as Consulting Physician  PLACE OF SERVICE:  [place] Directive information Does Patient Have a Medical Advance Directive?: Yes, Type of Advance Directive: [place];Living will, Does patient want to make changes to medical advance directive?: No - Patient declined  Allergies  Allergen Reactions   Aricept [Donepezil] Diarrhea, Nausea And Vomiting and Other (See Comments)    Cannot tolerate- bradycardia, also   Tropicamide Nausea And Vomiting    Used to dilate eyes (might be this- was switched to something this was tolerable, after this)   Adhesive [Tape] Rash    Chief Complaint  Patient presents with   Medical Management of Chronic Issues    6 month follow-up. Soreness under left arm off/on x several months. Discuss need for eye exam, A1c, covid vaccine, PNA, and shingrix vaccines or exclude. Foot exam today. Here with daughter, [name].      HPI: Patient is a 87 y.o. male for routine follow up.  Wife is on hospice. This has been very hard. Not eating or drinking for several days. Very hard to watch.   Tender under arm. Very itchy. No numbness or tingling.  Been there fore about 1 months.   Completed shingles vaccines.   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 Neal Hutto  Hyperlipidemia- daughter did not wish for him to be on statin.   Bph- ongoing.   CHF- they have tried to decrease lasix but causing increase in shortness of breath when talking-  when he has not been on medication daily. Working with him to minimize use to avoid dehydration. No swelling or weight gain.   Reports urine has been dark, skin dry.    Review of Systems:  Review of Systems  Constitutional:  Negative for chills, fever and weight loss.  HENT:   Negative for hearing loss and tinnitus.   Eyes:  Negative for blurred vision.  Respiratory:  Negative for cough, sputum production and shortness of breath.   Cardiovascular:  Negative for chest pain, palpitations and leg swelling.  Gastrointestinal:  Negative for abdominal pain, constipation, diarrhea and heartburn.  Genitourinary:  Negative for dysuria, frequency and urgency.  Musculoskeletal:  Negative for back pain, falls, joint pain and myalgias.  Skin:  Positive for itching and rash.  Neurological:  Negative for dizziness and headaches.  Psychiatric/Behavioral:  Negative for depression and memory loss. The patient does not have insomnia.    Past Medical History:  Diagnosis Date   A-fib ([place])    Anticoagulated on Coumadin    Per records from [facility]    Aortic regurgitation    Aortic stenosis    CHF (congestive heart failure) ([place])    Coronary artery disease    CVA (cerebral vascular accident) ([place])    hx of CVA noted on CT from 02/19/18   Diabetes mellitus, type 2 ([place])    Gastro-esophageal reflux disease without esophagitis    Per records from previous provider, [facility]   Heart attack [facility])    History of CT scan of head 02/19/2018   Per records from previous provider, [facility] and [facility]. Chronic changes small vessel disease   History of ECG    03/26/14- Sinus Tachycardia @ 107 bmp, QRS 78 msec, QT 298 msec, QTc 361 msec.  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 [facility])    Per records from previous provider, [facility] and [facility]    MI (myocardial infarction) [facility])    Per records from [place]    Mitral regurgitation    Mitral stenosis    Other intervertebral disc degeneration, lumbar region    Per records from  previous provider, [facility]mur[facility]   Radiculopathy, lumbar region    Per records from previous provider, [facility] and [facility]   Sinus bradycardia    Per records from [place]    Sleep apnea    Transient ischemic attack    10 to 12 years ago   Typical atrial flutter [facility])    Per records from [place]    Past Surgical History:  Procedure Laterality Date   [place] N/A 12/26/2018   Procedure: CARDIOVERSION;  Surgeon: Pixie Casino, MD;  Location: [place];  Service: Cardiovascular;  Laterality: N/A;   CARDIOVERSION N/A 05/14/2019   Procedure: CARDIOVERSION;  Surgeon: [name], MD;  [facility] ENDOSCOPY;  Service: Cardiovascular;  Laterality: N/A;   CATARACT EXTRACTION     Debridement to right arm with MRSA infection     4 years ago   GALLBLADDER SURGERY     Per records from [place] 07/07/2019   Procedure: PACEMAKER IMPLANT;  Surgeon: [name], MD;  Location: [place] CV LAB;  Service: Cardiovascular;  Laterality: N/A;   TEE WITHOUT CARDIOVERSION N/A 12/26/2018   Procedure: TRANSESOPHAGEAL ECHOCARDIOGRAM (TEE);  Surgeon: Pixie Casino, MD;  Location: [facility] ENDOSCOPY;  Service: Cardiovascular;  Laterality: N/A;   Social History:   reports that he quit smoking about 67 years ago. His smoking use included cigarettes and cigars. He has never used smokeless tobacco. He reports that he does not drink alcohol and does not use drugs.  Family History  Problem Relation Age of Onset   Heart failure Mother    Hyperlipidemia Mother    Hypertension Son    Hyperlipidemia Son    Arthritis Son    Arthritis Daughter    Hypertension Son     Medications: Patient's Medications  New Prescriptions   No medications on file  Previous Medications   AMIODARONE (PACERONE) 200 MG TABLET    Take 1 tablet (200 mg total) by mouth daily.    AMOXICILLIN (AMOXIL) 500 MG CAPSULE    Take 2,000 mg by mouth See admin instructions. Take 2,000 mg by mouth one hour prior to dental appointment   CHOLECALCIFEROL (VITAMIN D3) 50 MCG (2000 UT) TABS    Take 2,000 Units by mouth daily with breakfast.   DOCUSATE SODIUM (COLACE) 100 MG CAPSULE    Take 100 mg by mouth daily.   FUROSEMIDE (LASIX) 20 MG TABLET    Take 20 mg daily as needed Sun, Tuesday, Thursday, and Saturday, will wean down   GABAPENTIN (NEURONTIN) 100 MG CAPSULE    Take 1 capsule (100 mg total) by mouth daily as needed.   HYDRALAZINE (APRESOLINE) 50 MG TABLET    Take 1 tablet (50 mg total) by mouth 2 (two) times daily.   LOSARTAN (COZAAR) 100 MG TABLET    Take 1 tablet (100 mg  total) by mouth daily.   MELATONIN 5 MG TABS    Take 5 mg by mouth at bedtime as needed (for sleep).    MEMANTINE (NAMENDA) 10 MG TABLET    Take 1 tablet (10 mg total) by mouth 2 (two) times daily.   METOPROLOL SUCCINATE (TOPROL-XL) 50 MG 24 HR TABLET    Take 1 tablet (50 mg total) by mouth daily.   MULTIPLE VITAMIN (MULTIVITAMIN) TABLET    Take 1 tablet by mouth daily.   RIVAROXABAN (XARELTO) 20 MG TABS TABLET    Take 1 tablet (20 mg total) by mouth daily with supper.   TAMSULOSIN (FLOMAX) 0.4 MG CAPS CAPSULE    Take 1 capsule (0.4 mg total) by mouth daily.  Modified Medications   No medications on file  Discontinued Medications   INVELTYS 1 % SUSP    Place into the right eye.   MAGNESIUM 400 MG CAPS    Take 1 tablet by mouth daily.    Physical Exam:  Vitals:   02/25/21 1444  BP: (!) 144/78  Pulse: 76  Temp: (!) 97.5 F (36.4 C)  TempSrc: Temporal  SpO2: 96%  Weight: 178 lb (80.7 kg)  Height: '5\' 10"'$  (1.778 m)   Body mass index is 25.54 kg/m. Wt Readings from Last 3 Encounters:  02/25/21 178 lb (80.7 kg)  11/01/20 178 lb 6.4 oz (80.9 kg)  07/28/20 178 lb (80.7 kg)    Physical Exam Constitutional:      General: He is not in acute distress.    Appearance: He is well-developed. He is not  diaphoretic.  HENT:     Head: Normocephalic and atraumatic.     Right Ear: Tympanic membrane, ear canal and external ear normal.     Left Ear: Tympanic membrane, ear canal and external ear normal.     Mouth/Throat:     Pharynx: No oropharyngeal exudate.  Eyes:     Conjunctiva/sclera: Conjunctivae normal.     Pupils: Pupils are equal, round, and reactive to light.  Cardiovascular:     Rate and Rhythm: Normal rate and regular rhythm.     Heart sounds: Normal heart sounds.  Pulmonary:     Effort: Pulmonary effort is normal.     Breath sounds: Normal breath sounds.  Abdominal:     General: Bowel sounds are normal.     Palpations: Abdomen is soft.  Musculoskeletal:        General: No tenderness.     Cervical back: Normal range of motion and neck supple.     Right lower leg: No edema.     Left lower leg: No edema.  Skin:    General: Skin is warm and dry.     Findings: Rash (under left arm) present.  Neurological:     Mental Status: He is alert. Mental status is at baseline.  Psychiatric:        Mood and Affect: Mood normal.    Labs reviewed: Basic Metabolic Panel: Recent Labs    06/22/20 1123 11/01/20 1049  NA 144 142  K 4.1 4.1  CL 107* 104  CO2 24 25  GLUCOSE 143* 106*  BUN 30* 22  CREATININE 1.30* 1.11  CALCIUM 8.5* 8.4*  TSH  --  1.800   Liver Function Tests: Recent Labs    06/22/20 1123 11/01/20 1049  AST 16 31  ALT 17 51*  ALKPHOS 102 103  BILITOT 0.9 0.6  PROT 6.4 6.7  ALBUMIN 3.9 3.7   No results  for input(s): LIPASE, AMYLASE in the last 8760 hours. No results for input(s): AMMONIA in the last 8760 hours. CBC: Recent Labs    06/22/20 1123 11/01/20 1049  WBC 9.5 5.6  HGB 13.6 14.0  HCT 42.2 42.2  MCV 98* 94  PLT 193 203   Lipid Panel: Recent Labs    11/01/20 1049  CHOL 179  HDL 33*  LDLCALC 124*  TRIG 122  CHOLHDL 5.4*   TSH: Recent Labs    11/01/20 1049  TSH 1.800   A1C: Lab Results  Component Value Date   HGBA1C 5.8 (H)  10/15/2019     1. Type 2 diabetes mellitus with diabetic neuropathy, without long-term current use of insulin (HCC) -Encouraged dietary compliance, routine foot care/monitoring and to keep up with diabetic eye exams through ophthalmology  - COMPLETE METABOLIC PANEL WITH GFR - Hemoglobin A1c  2. Diastolic congestive heart failure, unspecified HF chronicity (HCC) -stable, euvolemic at this time. Daughter follows symptoms closely and gives lasix - COMPLETE METABOLIC PANEL WITH GFR  3. Paroxysmal atrial fibrillation (HCC) Rate controlled. Continues to follow up with a fib clinic, continues on xarelto for anticoagulation and amiodarone with metoprolol for rate control.   - COMPLETE METABOLIC PANEL WITH GFR - CBC with Differential/Platelet  4. Yeast dermatitis - nystatin cream (MYCOSTATIN); Apply 1 application topically 2 (two) times daily.  Dispense: 30 g; Refill: 0  5. Benign prostatic hyperplasia with urinary frequency -sable on flomax  6. Essential hypertension -stable. Goal bp <140/90. Continue on current regimen with low sodium diet.     Next appt: 6 months. Carlos American. [place], [facility] [phone]

## 2021-02-26 LAB — CBC WITH DIFFERENTIAL/PLATELET
Absolute Monocytes: 553 cells/uL (ref 200–950)
Basophils Absolute: 7 cells/uL (ref 0–200)
Basophils Relative: 0.1 %
Eosinophils Absolute: 210 cells/uL (ref 15–500)
Eosinophils Relative: 3 %
HCT: 41.9 % (ref 38.5–50.0)
Hemoglobin: 13.8 g/dL (ref 13.2–17.1)
Lymphs Abs: 1918 cells/uL (ref 850–3900)
MCH: 31.9 pg (ref 27.0–33.0)
MCHC: 32.9 g/dL (ref 32.0–36.0)
MCV: 97 fL (ref 80.0–100.0)
MPV: 10.9 fL (ref 7.5–12.5)
Monocytes Relative: 7.9 %
Neutro Abs: 4312 cells/uL (ref 1500–7800)
Neutrophils Relative %: 61.6 %
Platelets: 221 10*3/uL (ref 140–400)
RBC: 4.32 10*6/uL (ref 4.20–5.80)
RDW: 12.5 % (ref 11.0–15.0)
Total Lymphocyte: 27.4 %
WBC: 7 10*3/uL (ref 3.8–10.8)

## 2021-02-26 LAB — HEMOGLOBIN A1C
Hgb A1c MFr Bld: 5.7 % of total Hgb — ABNORMAL HIGH (ref <5.7)
Mean Plasma Glucose: 117 mg/dL
eAG (mmol/L): 6.5 mmol/L

## 2021-02-26 LAB — COMPLETE METABOLIC PANEL WITH GFR
AG Ratio: 1.3 (calc) (ref 1.0–2.5)
ALT: 40 U/L (ref 9–46)
AST: 30 U/L (ref 10–35)
Albumin: 3.4 g/dL — ABNORMAL LOW (ref 3.6–5.1)
Alkaline phosphatase (APISO): 95 U/L (ref 35–144)
BUN/Creatinine Ratio: 17 (calc) (ref 6–22)
BUN: 21 mg/dL (ref 7–25)
CO2: 27 mmol/L (ref 20–32)
Calcium: 8.4 mg/dL — ABNORMAL LOW (ref 8.6–10.3)
Chloride: 108 mmol/L (ref 98–110)
Creat: 1.25 mg/dL — ABNORMAL HIGH (ref 0.70–1.22)
Globulin: 2.7 g/dL (calc) (ref 1.9–3.7)
Glucose, Bld: 112 mg/dL — ABNORMAL HIGH (ref 65–99)
Potassium: 4.4 mmol/L (ref 3.5–5.3)
Sodium: 141 mmol/L (ref 135–146)
Total Bilirubin: 0.7 mg/dL (ref 0.2–1.2)
Total Protein: 6.1 g/dL (ref 6.1–8.1)
eGFR: 56 mL/min/{1.73_m2} — ABNORMAL LOW (ref >=60)

## 2021-03-16 ENCOUNTER — Ambulatory Visit (INDEPENDENT_AMBULATORY_CARE_PROVIDER_SITE_OTHER): Payer: Medicare HMO | Admitting: Nurse Practitioner

## 2021-03-16 ENCOUNTER — Ambulatory Visit
Admission: RE | Admit: 2021-03-16 | Discharge: 2021-03-16 | Disposition: A | Discharge status: Home / Self Care | Payer: Medicare HMO | Source: Ambulatory Visit | Attending: Nurse Practitioner | Admitting: Nurse Practitioner

## 2021-03-16 ENCOUNTER — Other Ambulatory Visit: Payer: Self-pay

## 2021-03-16 ENCOUNTER — Encounter: Payer: Self-pay | Admitting: Family

## 2021-03-16 VITALS — BP 126/80 | HR 77 | Temp 97.7°F | Ht 70.0 in | Wt 177.0 lb

## 2021-03-16 DIAGNOSIS — F320 Major depressive disorder, single episode, mild: Secondary | ICD-10-CM | POA: Diagnosis not present

## 2021-03-16 DIAGNOSIS — R5383 Other fatigue: Secondary | ICD-10-CM | POA: Diagnosis not present

## 2021-03-16 DIAGNOSIS — R0602 Shortness of breath: Secondary | ICD-10-CM

## 2021-03-16 DIAGNOSIS — R69 Illness, unspecified: Secondary | ICD-10-CM | POA: Diagnosis not present

## 2021-03-16 DIAGNOSIS — I5030 Unspecified diastolic (congestive) heart failure: Secondary | ICD-10-CM

## 2021-03-16 LAB — BASIC METABOLIC PANEL WITH GFR
BUN: 19 mg/dL (ref 7–25)
CO2: 26 mmol/L (ref 20–32)
Calcium: 8.1 mg/dL — ABNORMAL LOW (ref 8.6–10.3)
Chloride: 106 mmol/L (ref 98–110)
Creat: 1.12 mg/dL (ref 0.70–1.22)
Glucose, Bld: 98 mg/dL (ref 65–139)
Potassium: 4 mmol/L (ref 3.5–5.3)
Sodium: 140 mmol/L (ref 135–146)
eGFR: 64 mL/min/{1.73_m2} (ref >=60)

## 2021-03-16 LAB — CBC WITH DIFFERENTIAL/PLATELET
Absolute Monocytes: 673 cells/uL (ref 200–950)
Basophils Absolute: 20 cells/uL (ref 0–200)
Basophils Relative: 0.3 %
Eosinophils Absolute: 178 cells/uL (ref 15–500)
Eosinophils Relative: 2.7 %
HCT: 40.3 % (ref 38.5–50.0)
Hemoglobin: 13.1 g/dL — ABNORMAL LOW (ref 13.2–17.1)
Lymphs Abs: 1531 cells/uL (ref 850–3900)
MCH: 31.3 pg (ref 27.0–33.0)
MCHC: 32.5 g/dL (ref 32.0–36.0)
MCV: 96.2 fL (ref 80.0–100.0)
MPV: 11 fL (ref 7.5–12.5)
Monocytes Relative: 10.2 %
Neutro Abs: 4198 cells/uL (ref 1500–7800)
Neutrophils Relative %: 63.6 %
Platelets: 234 10*3/uL (ref 140–400)
RBC: 4.19 10*6/uL — ABNORMAL LOW (ref 4.20–5.80)
RDW: 12.9 % (ref 11.0–15.0)
Total Lymphocyte: 23.2 %
WBC: 6.6 10*3/uL (ref 3.8–10.8)

## 2021-03-16 MED ORDER — TRAZODONE HCL 50 MG PO TABS: ORAL_TABLET | ORAL | 3 refills | Status: DC

## 2021-03-16 NOTE — Progress Notes (Signed)
Careteam: Patient Care Team: [place] Chandler, NP as PCP - General (Geriatric Medicine) Jose Klein, MD as PCP - Cardiology (Cardiology) [facility], [facility] (Gastroenterology) Jose Harold, MD as Consulting Physician  PLACE OF SERVICE:  [place] Directive information Does Patient Have a Medical Advance Directive?: Yes, Type of Advance Directive: [place];Living will, Does patient want to make changes to medical advance directive?: No - Patient declined  Allergies  Allergen Reactions   Aricept [Donepezil] Diarrhea, Nausea And Vomiting and Other (See Comments)    Cannot tolerate- bradycardia, also   Tropicamide Nausea And Vomiting    Used to dilate eyes (might be this- was switched to something this was tolerable, after this)   Adhesive [Tape] Rash    Chief Complaint  Patient presents with   Acute Visit    Patient is having, SOB, and wheezing. Patient wife recently passed away, patient has been having insomnia. At home covid test performed today and results were Negative. Patient takes fluid pill differently than what is on active medication list, will discuss and update accordingly, patient will need a refill on Lasix for a 90 day supply. Examine left underarm. Patient c/o soreness in rib area. Here with daughter, [facility].      HPI: Patient is a 87 y.o. male due to shortness of breath and wheezing. Daughter reports symptoms for 4 days. Yesterday he was have significant wheezing and shortness of breath.daughter increased his lasix and gave him 2 doses yesterday.  No fever or cough.  He is complaining of hyperventilating.   He does not typically swell when he is in heart failure.   Reports shortness of breath today.   Also having a hard time sleeping and reports increase in low mood and depression since his wife has passed. Would like to try medication. Daughter also questioned if shortness of breath was related to anxiety due to his wives  recent passing but she also notes that he has wheezing as well.   Review of Systems:  Review of Systems  Constitutional:  Positive for malaise/fatigue. Negative for chills, fever and weight loss.  HENT:  Negative for tinnitus.   Respiratory:  Positive for shortness of breath and wheezing. Negative for cough and sputum production.   Cardiovascular:  Negative for chest pain, palpitations and leg swelling.  Gastrointestinal:  Negative for abdominal pain, constipation, diarrhea and heartburn.  Genitourinary:  Negative for dysuria, frequency and urgency.  Musculoskeletal:  Negative for back pain, falls, joint pain and myalgias.  Skin: Negative.   Neurological:  Positive for weakness. Negative for dizziness and headaches.  Psychiatric/Behavioral:  Positive for depression. Negative for memory loss. The patient is nervous/anxious and has insomnia.    Past Medical History:  Diagnosis Date   A-fib ([place])    Anticoagulated on Coumadin    Per records from [facility]    Aortic regurgitation    Aortic stenosis    CHF (congestive heart failure) ([place])    Coronary artery disease    CVA (cerebral vascular accident) ([place])    hx of CVA noted on CT from 02/19/18   Diabetes mellitus, type 2 ([place])    Gastro-esophageal reflux disease without esophagitis    Per records from previous provider, [facility]   Heart attack [facility])    History of CT scan of head 02/19/2018   Per records from previous provider, [facility] [place] [facility]. Chronic changes small vessel disease   History  of ECG    03/26/14- Sinus Tachycardia @ 107 bmp, QRS 78 msec, QT 298 msec, QTc 361 msec. Per records from [facility]. 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 [facility])    Per records from previous provider, [facility]    MI (myocardial infarction) [facility])    Per records from [place]    Mitral regurgitation    Mitral stenosis    Other intervertebral disc degeneration, lumbar region    Per records from previous provider, [facility]mur[facility] and [facility]   Radiculopathy, lumbar region    Per records from previous provider, [facility]   Sinus bradycardia    Per records from [place]    Sleep apnea    Transient ischemic attack    10 to 12 years ago   Typical atrial flutter [facility])    Per records from [place]    Past Surgical History:  Procedure Laterality Date   [place] N/A 12/26/2018   Procedure: CARDIOVERSION;  Surgeon: Pixie Casino, MD;  Location: [facility];  Service: Cardiovascular;  Laterality: N/A;   CARDIOVERSION N/A 05/14/2019   Procedure: CARDIOVERSION;  Surgeon: [facility] Klein, MD;  Location: [facility];  Service: Cardiovascular;  Laterality: N/A;   CATARACT EXTRACTION     Debridement to right arm with MRSA infection     4 years ago   GALLBLADDER SURGERY     Per records from [facility] 07/07/2019   Procedure: PACEMAKER IMPLANT;  Surgeon: [place] Klein, MD;  Location: [facility];  Service: Cardiovascular;  Laterality: N/A;   TEE WITHOUT CARDIOVERSION N/A 12/26/2018   Procedure: TRANSESOPHAGEAL ECHOCARDIOGRAM (TEE);  Surgeon: Pixie Casino, MD;  Location: [facility];  Service: Cardiovascular;  Laterality: N/A;   Social History:   reports that he quit smoking about 67 years ago. His smoking use included cigarettes and cigars. He has never used smokeless tobacco. He reports that he does not drink alcohol and does not use drugs.  Family History  Problem Relation Age of Onset   Heart failure Mother    Hyperlipidemia Mother    Hypertension Son    Hyperlipidemia Son    Arthritis Son    Arthritis Daughter    Hypertension  Son     Medications: Patient's Medications  New Prescriptions   No medications on file  Previous Medications   AMIODARONE (PACERONE) 200 MG TABLET    Take 1 tablet (200 mg total) by mouth daily.   AMOXICILLIN (AMOXIL) 500 MG CAPSULE    Take 2,000 mg by mouth See admin instructions. Take 2,000 mg by mouth one hour prior to dental appointment   CHOLECALCIFEROL (VITAMIN D3) 50 MCG (2000 UT) TABS    Take 2,000 Units by mouth daily with breakfast.   DOCUSATE SODIUM (COLACE) 100 MG CAPSULE    Take 100 mg by mouth daily.   FUROSEMIDE (LASIX) 20 MG TABLET    Take 20 mg daily as needed Sun, Tuesday, Thursday, and Saturday   GABAPENTIN (NEURONTIN) 100 MG CAPSULE    Take 1 capsule (100 mg total) by mouth daily as needed.   HOMEOPATHIC PRODUCTS (SLEEP MEDICINE PO)    Take by mouth as needed.   HYDRALAZINE (APRESOLINE) 50  MG TABLET    Take 1 tablet (50 mg total) by mouth 2 (two) times daily.   LOSARTAN (COZAAR) 100 MG TABLET    Take 1 tablet (100 mg total) by mouth daily.   MELATONIN 5 MG TABS    Take 5 mg by mouth at bedtime as needed (for sleep).    MEMANTINE (NAMENDA) 10 MG TABLET    Take 1 tablet (10 mg total) by mouth 2 (two) times daily.   METOPROLOL SUCCINATE (TOPROL-XL) 50 MG 24 HR TABLET    Take 1 tablet (50 mg total) by mouth daily.   MULTIPLE VITAMIN (MULTIVITAMIN) TABLET    Take 1 tablet by mouth daily.   NYSTATIN CREAM (MYCOSTATIN)    Apply 1 application topically 2 (two) times daily.   RIVAROXABAN (XARELTO) 20 MG TABS TABLET    Take 1 tablet (20 mg total) by mouth daily with supper.   TAMSULOSIN (FLOMAX) 0.4 MG CAPS CAPSULE    Take 1 capsule (0.4 mg total) by mouth daily.  Modified Medications   No medications on file  Discontinued Medications   No medications on file    Physical Exam:  Vitals:   03/16/21 1114  BP: 126/80  Pulse: 77  Temp: 97.7 F (36.5 C)  TempSrc: Temporal  SpO2: 97%  Weight: 177 lb (80.3 kg)  Height: '5\' 10"'$  (1.778 m)   Body mass index is 25.4 kg/m. Wt  Readings from Last 3 Encounters:  03/16/21 177 lb (80.3 kg)  02/25/21 178 lb (80.7 kg)  11/01/20 178 lb 6.4 oz (80.9 kg)    Physical Exam Constitutional:      General: He is not in acute distress.    Appearance: He is well-developed. He is not diaphoretic.  HENT:     Head: Normocephalic and atraumatic.     Right Ear: External ear normal.     Left Ear: External ear normal.     Mouth/Throat:     Pharynx: No oropharyngeal exudate.  Eyes:     Conjunctiva/sclera: Conjunctivae normal.     Pupils: Pupils are equal, round, and reactive to light.  Cardiovascular:     Rate and Rhythm: Normal rate and regular rhythm.     Heart sounds: Normal heart sounds.  Pulmonary:     Effort: Pulmonary effort is normal.     Breath sounds: Normal breath sounds.  Abdominal:     General: Bowel sounds are normal.     Palpations: Abdomen is soft.  Musculoskeletal:        General: No tenderness.     Cervical back: Normal range of motion and neck supple.     Right lower leg: No edema.     Left lower leg: No edema.  Skin:    General: Skin is warm and dry.  Neurological:     Mental Status: He is alert and oriented to person, place, and time.    Labs reviewed: Basic Metabolic Panel: Recent Labs    06/22/20 1123 11/01/20 1049 02/25/21 1521  NA 144 142 141  K 4.1 4.1 4.4  CL 107* 104 108  CO2 '24 25 27  '$ GLUCOSE 143* 106* 112*  BUN 30* 22 21  CREATININE 1.30* 1.11 1.25*  CALCIUM 8.5* 8.4* 8.4*  TSH  --  1.800  --    Liver Function Tests: Recent Labs    06/22/20 1123 11/01/20 1049 02/25/21 1521  AST '16 31 30  '$ ALT 17 51* 40  ALKPHOS 102 103  --   BILITOT 0.9 0.6 0.7  PROT 6.4 6.7 6.1  ALBUMIN 3.9 3.7  --    No results for input(s): LIPASE, AMYLASE in the last 8760 hours. No results for input(s): AMMONIA in the last 8760 hours. CBC: Recent Labs    06/22/20 1123 11/01/20 1049 02/25/21 1521  WBC 9.5 5.6 7.0  NEUTROABS  --   --  4,312  HGB 13.6 14.0 13.8  HCT 42.2 42.2 41.9  MCV  98* 94 97.0  PLT 193 203 221   Lipid Panel: Recent Labs    11/01/20 1049  CHOL 179  HDL 33*  LDLCALC 124*  TRIG 122  CHOLHDL 5.4*   TSH: Recent Labs    11/01/20 1049  TSH 1.800   A1C: [facility] Results  Component Value Date   HGBA1C 5.7 (H) 02/25/2021     Assessment/Plan 1. Diastolic congestive heart failure, unspecified HF chronicity (HCC) -hx of CHF and on lasix, no signs of fluid retention at this time, weight stable, no LE, he does have increase shortness of breath and wheezing.  Daughter has increased lasix and feels like this has helped.  -continue daily lasix at this time while we obtain labs and chest xray - Brain Natriuretic Peptide - DG Chest 2 View; Future  2. Shortness of breath -ongoing for 4 days, VSS and reassuring. Breath sounds normal. Will follow up labs and xray at this time.  - Brain Natriuretic Peptide - DG Chest 2 View; Future - CBC with Differential/Platelet - BASIC METABOLIC PANEL WITH GFR  3. Depression, major, single episode, mild (HCC) -ongoing depression and insomnia since wife has passed. Will start trazodone to help with sleep and mood.  - traZODone (DESYREL) 50 MG tablet; To start with 25 mg by mouth at bedtime, can increase to 50 mg  Dispense: 30 tablet; Refill: 3   Teran Daughenbaugh K. [place], [facility] [phone]

## 2021-03-16 NOTE — Patient Instructions (Signed)
To go to [facility] to get chest xray  To start trazodone 25 mg by mouth at bedtime, after 1 week can increase to 50 mg by mouth at bedtime.

## 2021-03-17 ENCOUNTER — Other Ambulatory Visit: Payer: Self-pay | Admitting: Nurse Practitioner

## 2021-03-17 DIAGNOSIS — I5030 Unspecified diastolic (congestive) heart failure: Secondary | ICD-10-CM

## 2021-03-17 LAB — BRAIN NATRIURETIC PEPTIDE: Brain Natriuretic Peptide: 1374 pg/mL — ABNORMAL HIGH (ref <100)

## 2021-03-17 MED ORDER — POTASSIUM CHLORIDE CRYS ER 10 MEQ PO TBCR
10.0000 meq | EXTENDED_RELEASE_TABLET | Freq: Every day | ORAL | 1 refills | Status: DC
Start: 2021-03-17 — End: 2021-04-06

## 2021-03-17 NOTE — Progress Notes (Signed)
Increase lasix to 40 mg x 3 days then resume lasix 20 mg daily.  To take potassium 10 meq daily with increase in lasix.  Will follow up in 2 weeks in office.

## 2021-03-21 ENCOUNTER — Encounter: Payer: Self-pay | Admitting: Nurse Practitioner

## 2021-03-23 ENCOUNTER — Other Ambulatory Visit: Payer: Self-pay | Admitting: Nurse Practitioner

## 2021-03-23 DIAGNOSIS — F320 Major depressive disorder, single episode, mild: Secondary | ICD-10-CM

## 2021-03-24 ENCOUNTER — Emergency Department (HOSPITAL_BASED_OUTPATIENT_CLINIC_OR_DEPARTMENT_OTHER): Payer: Medicare HMO

## 2021-03-24 ENCOUNTER — Encounter (HOSPITAL_BASED_OUTPATIENT_CLINIC_OR_DEPARTMENT_OTHER): Payer: Self-pay | Admitting: *Deleted

## 2021-03-24 ENCOUNTER — Inpatient Hospital Stay (HOSPITAL_BASED_OUTPATIENT_CLINIC_OR_DEPARTMENT_OTHER)
Admission: EM | Admit: 2021-03-24 | Discharge: 2021-03-30 | DRG: 177 | Disposition: A | Discharge status: Home Health | Payer: Medicare HMO | Attending: Internal Medicine | Admitting: Internal Medicine

## 2021-03-24 ENCOUNTER — Other Ambulatory Visit: Payer: Self-pay

## 2021-03-24 DIAGNOSIS — I495 Sick sinus syndrome: Secondary | ICD-10-CM | POA: Diagnosis present

## 2021-03-24 DIAGNOSIS — G4733 Obstructive sleep apnea (adult) (pediatric): Secondary | ICD-10-CM | POA: Diagnosis present

## 2021-03-24 DIAGNOSIS — Z8249 Family history of ischemic heart disease and other diseases of the circulatory system: Secondary | ICD-10-CM

## 2021-03-24 DIAGNOSIS — I110 Hypertensive heart disease with heart failure: Secondary | ICD-10-CM | POA: Diagnosis present

## 2021-03-24 DIAGNOSIS — U071 COVID-19: Principal | ICD-10-CM | POA: Diagnosis present

## 2021-03-24 DIAGNOSIS — Z8349 Family history of other endocrine, nutritional and metabolic diseases: Secondary | ICD-10-CM

## 2021-03-24 DIAGNOSIS — R296 Repeated falls: Secondary | ICD-10-CM | POA: Diagnosis present

## 2021-03-24 DIAGNOSIS — R531 Weakness: Secondary | ICD-10-CM

## 2021-03-24 DIAGNOSIS — I252 Old myocardial infarction: Secondary | ICD-10-CM

## 2021-03-24 DIAGNOSIS — R059 Cough, unspecified: Secondary | ICD-10-CM | POA: Diagnosis not present

## 2021-03-24 DIAGNOSIS — Z79899 Other long term (current) drug therapy: Secondary | ICD-10-CM

## 2021-03-24 DIAGNOSIS — I2510 Atherosclerotic heart disease of native coronary artery without angina pectoris: Secondary | ICD-10-CM | POA: Diagnosis present

## 2021-03-24 DIAGNOSIS — I5043 Acute on chronic combined systolic (congestive) and diastolic (congestive) heart failure: Secondary | ICD-10-CM | POA: Diagnosis present

## 2021-03-24 DIAGNOSIS — I5033 Acute on chronic diastolic (congestive) heart failure: Secondary | ICD-10-CM

## 2021-03-24 DIAGNOSIS — G319 Degenerative disease of nervous system, unspecified: Secondary | ICD-10-CM | POA: Diagnosis not present

## 2021-03-24 DIAGNOSIS — I6381 Other cerebral infarction due to occlusion or stenosis of small artery: Secondary | ICD-10-CM | POA: Diagnosis not present

## 2021-03-24 DIAGNOSIS — I480 Paroxysmal atrial fibrillation: Secondary | ICD-10-CM | POA: Diagnosis present

## 2021-03-24 DIAGNOSIS — R778 Other specified abnormalities of plasma proteins: Secondary | ICD-10-CM | POA: Diagnosis not present

## 2021-03-24 DIAGNOSIS — S0993XA Unspecified injury of face, initial encounter: Secondary | ICD-10-CM | POA: Diagnosis not present

## 2021-03-24 DIAGNOSIS — I248 Other forms of acute ischemic heart disease: Secondary | ICD-10-CM | POA: Diagnosis present

## 2021-03-24 DIAGNOSIS — N400 Enlarged prostate without lower urinary tract symptoms: Secondary | ICD-10-CM | POA: Diagnosis present

## 2021-03-24 DIAGNOSIS — Z7901 Long term (current) use of anticoagulants: Secondary | ICD-10-CM

## 2021-03-24 DIAGNOSIS — R4189 Other symptoms and signs involving cognitive functions and awareness: Secondary | ICD-10-CM | POA: Diagnosis present

## 2021-03-24 DIAGNOSIS — E119 Type 2 diabetes mellitus without complications: Secondary | ICD-10-CM

## 2021-03-24 DIAGNOSIS — I10 Essential (primary) hypertension: Secondary | ICD-10-CM | POA: Diagnosis present

## 2021-03-24 DIAGNOSIS — I509 Heart failure, unspecified: Secondary | ICD-10-CM | POA: Diagnosis not present

## 2021-03-24 DIAGNOSIS — R7989 Other specified abnormal findings of blood chemistry: Secondary | ICD-10-CM

## 2021-03-24 DIAGNOSIS — Z8673 Personal history of transient ischemic attack (TIA), and cerebral infarction without residual deficits: Secondary | ICD-10-CM

## 2021-03-24 DIAGNOSIS — I080 Rheumatic disorders of both mitral and aortic valves: Secondary | ICD-10-CM | POA: Diagnosis present

## 2021-03-24 DIAGNOSIS — S199XXA Unspecified injury of neck, initial encounter: Secondary | ICD-10-CM | POA: Diagnosis not present

## 2021-03-24 DIAGNOSIS — Z85818 Personal history of malignant neoplasm of other sites of lip, oral cavity, and pharynx: Secondary | ICD-10-CM

## 2021-03-24 DIAGNOSIS — I483 Typical atrial flutter: Secondary | ICD-10-CM | POA: Diagnosis present

## 2021-03-24 DIAGNOSIS — M47812 Spondylosis without myelopathy or radiculopathy, cervical region: Secondary | ICD-10-CM | POA: Diagnosis not present

## 2021-03-24 DIAGNOSIS — Z888 Allergy status to other drugs, medicaments and biological substances status: Secondary | ICD-10-CM

## 2021-03-24 DIAGNOSIS — Z950 Presence of cardiac pacemaker: Secondary | ICD-10-CM

## 2021-03-24 DIAGNOSIS — K219 Gastro-esophageal reflux disease without esophagitis: Secondary | ICD-10-CM | POA: Diagnosis present

## 2021-03-24 LAB — BASIC METABOLIC PANEL
Anion gap: 12 (ref 5–15)
BUN: 27 mg/dL — ABNORMAL HIGH (ref 8–23)
CO2: 25 mmol/L (ref 22–32)
Calcium: 8.9 mg/dL (ref 8.9–10.3)
Chloride: 100 mmol/L (ref 98–111)
Creatinine, Ser: 1.24 mg/dL (ref 0.61–1.24)
GFR, Estimated: 57 mL/min — ABNORMAL LOW (ref >60)
Glucose, Bld: 151 mg/dL — ABNORMAL HIGH (ref 70–99)
Potassium: 4.3 mmol/L (ref 3.5–5.1)
Sodium: 137 mmol/L (ref 135–145)

## 2021-03-24 LAB — CBC
HCT: 45.3 % (ref 39.0–52.0)
Hemoglobin: 15 g/dL (ref 13.0–17.0)
MCH: 31.5 pg (ref 26.0–34.0)
MCHC: 33.1 g/dL (ref 30.0–36.0)
MCV: 95.2 fL (ref 80.0–100.0)
Platelets: 259 10*3/uL (ref 150–400)
RBC: 4.76 MIL/uL (ref 4.22–5.81)
RDW: 14.3 % (ref 11.5–15.5)
WBC: 10.3 10*3/uL (ref 4.0–10.5)
nRBC: 0 % (ref 0.0–0.2)

## 2021-03-24 LAB — URINALYSIS, ROUTINE W REFLEX MICROSCOPIC
Bilirubin Urine: NEGATIVE
Glucose, UA: NEGATIVE mg/dL
Hgb urine dipstick: NEGATIVE
Ketones, ur: NEGATIVE mg/dL
Leukocytes,Ua: NEGATIVE
Nitrite: NEGATIVE
Protein, ur: NEGATIVE mg/dL
Specific Gravity, Urine: 1.01 (ref 1.005–1.030)
pH: 6.5 (ref 5.0–8.0)

## 2021-03-24 LAB — RESP PANEL BY RT-PCR (FLU A&B, COVID) ARPGX2
Influenza A by PCR: NEGATIVE
Influenza B by PCR: NEGATIVE
SARS Coronavirus 2 by RT PCR: POSITIVE — AB

## 2021-03-24 LAB — TROPONIN I (HIGH SENSITIVITY): Troponin I (High Sensitivity): 115 ng/L (ref <18)

## 2021-03-24 LAB — BRAIN NATRIURETIC PEPTIDE: B Natriuretic Peptide: 1468 pg/mL — ABNORMAL HIGH (ref 0.0–100.0)

## 2021-03-24 MED ORDER — FUROSEMIDE 10 MG/ML IJ SOLN
40.0000 mg | Freq: Once | INTRAMUSCULAR | Status: AC
  Administered 2021-03-24: 40 mg via INTRAVENOUS
  Filled 2021-03-24: qty 4

## 2021-03-24 MED ORDER — DEXAMETHASONE SODIUM PHOSPHATE 4 MG/ML IJ SOLN: 4.0000 mg | Freq: Once | INTRAMUSCULAR | Status: DC

## 2021-03-24 NOTE — ED Triage Notes (Signed)
C/o increased weakness and falls , cough x 5 days

## 2021-03-24 NOTE — ED Notes (Signed)
Patient transported to CT 

## 2021-03-24 NOTE — ED Provider Notes (Signed)
[facility] EMERGENCY DEPARTMENT Provider Note   CSN: [id number] Arrival date & time: 03/24/21  1913     History Chief Complaint  Patient presents with   Weakness    Alphonsus Brandell is a 87 y.o. male hx of afib on xarelto, diastolic heart failure, here presenting with weakness and increased falls.  Patient lives at home with family.  Per family, patient has been having progressive weakness and low-grade temperature for the last several days.  Patient also has been falling more often.  Patient walks with a walker at baseline.  Today he slid out of bed and was on the floor and was unable to get up.  Patient was seen by primary care doctor about 2 weeks ago for shortness of breath and was noted to have elevated BNP and was told to take additional Lasix 40 mg for several days and back down to baseline 20 mg.  Patient denies any chest pain.  The history is provided by the patient.      Past Medical History:  Diagnosis Date   A-fib ([place])    Anticoagulated on Coumadin    Per records from [facility]    Aortic regurgitation    Aortic stenosis    CHF (congestive heart failure) ([place])    Coronary artery disease    CVA (cerebral vascular accident) ([place])    hx of CVA noted on CT from 02/19/18   Diabetes mellitus, type 2 ([place])    Gastro-esophageal reflux disease without esophagitis    Per records from previous provider, [facility]   Heart attack [facility])    History of CT scan of head 02/19/2018   Per records from previous provider, [facility] and [facility]. Chronic changes small vessel disease   History of ECG    03/26/14- Sinus Tachycardia @ 107 bmp, QRS 78 msec, QT 298 msec, QTc 361 msec. 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 [facility])    Per records  from previous provider, [facility] and [facility]    MI (myocardial infarction) [facility])    Per records from [place]    Mitral regurgitation    Mitral stenosis    Other intervertebral disc degeneration, lumbar region    Per records from previous provider, Dorcas Mcmurray and [facility]   Radiculopathy, lumbar region    Per records from previous provider, [facility] and [facility]   Sinus bradycardia    Per records from [place]    Sleep apnea    Transient ischemic attack    10 to 12 years ago   Typical atrial flutter [facility])    Per records from [place]     Patient Active Problem List   Diagnosis Date Noted   Atypical atrial flutter ([place]) 11/07/2019   Secondary hypercoagulable state ([place]) 11/07/2019   Pacemaker 07/07/2019   Tachycardia-bradycardia syndrome ([place])    Paroxysmal atrial flutter ([place])    Paroxysmal A-fib ([place]) 05/14/2019   Dyslipidemia (high LDL; low HDL) 03/26/2019   SSS (sick sinus syndrome) ([place]) 03/26/2019   Fatigue 01/16/2019   Persistent atrial fibrillation ([place])    Vitamin D deficiency 11/20/2018   Aortic valve regurgitation 07/07/2018   Sinus bradycardia 07/07/2018   Long term (current) use of anticoagulants 0000000   Diastolic  dysfunction 07/07/2018   Hypercholesteremia 07/07/2018   Mild dilation of ascending aorta (HCC) 07/07/2018   OSA (obstructive sleep apnea)    Essential hypertension    Diabetes mellitus, type 2 (HCC)    Coronary artery disease    Paroxysmal atrial fibrillation (HCC)    CHF (congestive heart failure) ([place])     Past Surgical History:  Procedure Laterality Date   CARDIAC ELECTROPHYSIOLOGY STUDY AND ABLATION     CARDIOVERSION N/A 12/26/2018   Procedure: CARDIOVERSION;  Surgeon: Pixie Casino, MD;  Location: [facility];  Service: Cardiovascular;  Laterality: N/A;   CARDIOVERSION N/A 05/14/2019   Procedure: CARDIOVERSION;  Surgeon: [name], MD;  Location: [facility]  ENDOSCOPY;  Service: Cardiovascular;  Laterality: N/A;   CATARACT EXTRACTION     Debridement to right arm with MRSA infection     4 years ago   GALLBLADDER SURGERY     Per records from [place] 07/07/2019   Procedure: PACEMAKER IMPLANT;  Surgeon: [name], MD;  Location: [place] CV LAB;  Service: Cardiovascular;  Laterality: N/A;   TEE WITHOUT CARDIOVERSION N/A 12/26/2018   Procedure: TRANSESOPHAGEAL ECHOCARDIOGRAM (TEE);  Surgeon: Pixie Casino, MD;  Location: [facility] ENDOSCOPY;  Service: Cardiovascular;  Laterality: N/A;       Family History  Problem Relation Age of Onset   Heart failure Mother    Hyperlipidemia Mother    Hypertension Son    Hyperlipidemia Son    Arthritis Son    Arthritis Daughter    Hypertension Son     Social History   Tobacco Use   Smoking status: Former    Years: 1.00    Types: Cigarettes, Cigars    Quit date: 07/31/1953    Years since quitting: 67.6   Smokeless tobacco: Never   Tobacco comments:    Quit at age 25  Vaping Use   Vaping Use: Never used  Substance Use Topics   Alcohol use: Never   Drug use: Never    Home Medications Prior to Admission medications   Medication Sig Start Date End Date Taking? Authorizing Provider  amiodarone (PACERONE) 200 MG tablet Take 1 tablet (200 mg total) by mouth daily. 11/01/20   Croitoru, Mihai, MD  amoxicillin (AMOXIL) 500 MG capsule Take 2,000 mg by mouth See admin instructions. Take 2,000 mg by mouth one hour prior to dental appointment 04/17/19   [provider]  Cholecalciferol (VITAMIN D3) 50 MCG (2000 UT) TABS Take 2,000 Units by mouth daily with breakfast.    [provider]  docusate sodium (COLACE) 100 MG capsule Take 100 mg by mouth daily.    [provider]  furosemide (LASIX) 20 MG tablet Take 20 mg daily as needed Sun, Tuesday, Thursday, and Saturday 07/28/20   Croitoru, Mihai, MD  gabapentin (NEURONTIN) 100 MG capsule Take 1 capsule  (100 mg total) by mouth daily as needed. 08/11/19   [name], NP  Homeopathic Products (SLEEP MEDICINE PO) Take by mouth as needed.    [provider]  hydrALAZINE (APRESOLINE) 50 MG tablet Take 1 tablet (50 mg total) by mouth 2 (two) times daily. 02/02/21   [name], NP  losartan (COZAAR) 100 MG tablet Take 1 tablet (100 mg total) by mouth daily. 02/02/21   [name], NP  Melatonin 5 MG TABS Take 5 mg by mouth at bedtime as needed (for sleep).     [provider]  memantine (NAMENDA) 10 MG tablet  Take 1 tablet (10 mg total) by mouth 2 (two) times daily. 02/02/21   [name], NP  metoprolol succinate (TOPROL-XL) 50 MG 24 hr tablet Take 1 tablet (50 mg total) by mouth daily. 07/28/20   Croitoru, Mihai, MD  Multiple Vitamin (MULTIVITAMIN) tablet Take 1 tablet by mouth daily.    [provider]  nystatin cream (MYCOSTATIN) Apply 1 application topically 2 (two) times daily. 02/25/21   [name], NP  potassium chloride (KLOR-CON) 10 MEQ tablet Take 1 tablet (10 mEq total) by mouth daily. 03/17/21   [name], NP  rivaroxaban (XARELTO) 20 MG TABS tablet Take 1 tablet (20 mg total) by mouth daily with supper. 02/02/21   [name], NP  tamsulosin (FLOMAX) 0.4 MG CAPS capsule Take 1 capsule (0.4 mg total) by mouth daily. 02/02/21   [name], NP  traZODone (DESYREL) 50 MG tablet To start with 25 mg by mouth at bedtime, can increase to 50 mg 03/16/21   [name], NP    Allergies    Aricept [donepezil], Tropicamide, and Adhesive [tape]  Review of Systems   Review of Systems  Neurological:  Positive for weakness.  All other systems reviewed and are negative.  Physical Exam Updated Vital Signs BP (!) 168/64   Pulse 61   Temp 99.6 F (37.6 C) (Oral)   Resp 14   Ht '5\' 9"'$  (1.753 m)   SpO2 97%   BMI 26.14 kg/m   Physical Exam Vitals and nursing note reviewed.  Constitutional:      Comments:  Chronically ill-appearing and very deconditioned  HENT:     Head: Normocephalic.     Comments: No obvious scalp hematoma    Mouth/Throat:     Mouth: Mucous membranes are moist.  Eyes:     Extraocular Movements: Extraocular movements intact.     Pupils: Pupils are equal, round, and reactive to light.  Cardiovascular:     Rate and Rhythm: Normal rate and regular rhythm.     Pulses: Normal pulses.     Heart sounds: Normal heart sounds.  Pulmonary:     Comments: Crackles bilateral bases Abdominal:     General: Abdomen is flat.     Palpations: Abdomen is soft.  Musculoskeletal:        General: Normal range of motion.     Cervical back: Normal range of motion and neck supple.     Comments: No obvious spinal tenderness.  Normal range of motion of bilateral hips.  1+ edema bilateral legs  Skin:    General: Skin is warm.     Capillary Refill: Capillary refill takes less than 2 seconds.  Neurological:     General: No focal deficit present.     Comments: Patient strength is 3 out of 5 bilateral arms and legs.  Patient unable to sit up by himself  Psychiatric:        Mood and Affect: Mood normal.        Behavior: Behavior normal.    ED Results / Procedures / Treatments   Labs (all labs ordered are listed, but only abnormal results are displayed) Labs Reviewed  BASIC METABOLIC PANEL - Abnormal; Notable for the following components:      Result Value   Glucose, Bld 151 (*)    BUN 27 (*)    GFR, Estimated 57 (*)    All other components within normal limits  BRAIN NATRIURETIC PEPTIDE - Abnormal; Notable for the following components:  B Natriuretic Peptide 1,468.0 (*)    All other components within normal limits  RESP PANEL BY RT-PCR (FLU A&B, COVID) ARPGX2  CBC  URINALYSIS, ROUTINE W REFLEX MICROSCOPIC  TROPONIN I (HIGH SENSITIVITY)    EKG EKG Interpretation  Date/Time:  Thursday March 24 2021 19:41:11 EDT Ventricular Rate:  60 PR Interval:  310 QRS Duration: 92 QT  Interval:  500 QTC Calculation: 500 R Axis:   -24 Text Interpretation: Atrial-paced rhythm with prolonged AV conduction Nonspecific ST and T wave abnormality Prolonged QT Abnormal ECG Previous tracing paced Confirmed by Wandra Arthurs [phone]) on 03/24/2021 9:30:36 PM  Radiology DG Chest 2 View  Result Date: 03/24/2021 CLINICAL DATA:  Weakness, cough EXAM: CHEST - 2 VIEW COMPARISON:  03/16/2021 FINDINGS: Lungs are clear. No pneumothorax or pleural effusion. Cardiac size is mildly enlarged, unchanged. Left subclavian dual lead pacemaker is unchanged. Osseous structures are age-appropriate. Pulmonary vascularity is normal. IMPRESSION: No active cardiopulmonary disease. Electronically Signed   By: Fidela Salisbury M.D.   On: 03/24/2021 22:03   CT HEAD WO CONTRAST (5MM)  Result Date: 03/24/2021 CLINICAL DATA:  Facial trauma. Patient reports increased weakness and falls. EXAM: CT HEAD WITHOUT CONTRAST TECHNIQUE: Contiguous axial images were obtained from the base of the skull through the vertex without intravenous contrast. COMPARISON:  None. FINDINGS: Brain: No acute hemorrhage. Generalized cerebral and cerebellar atrophy. Ventricular prominence is likely due to central atrophy. There is moderate to advanced periventricular and deep white matter hypodensity typical of chronic small vessel ischemia. Remote lacunar infarct in the left thalamus and basal ganglia. No evidence of acute ischemia. No subdural or extra-axial collection. No midline shift or mass effect. Vascular: Atherosclerosis of skullbase vasculature without hyperdense vessel or abnormal calcification. Skull: No fracture or focal lesion. Sinuses/Orbits: Mucous retention cyst in the left maxillary sinus. No fracture or acute findings. Bilateral cataract resection. No mastoid effusion. Other: No confluent scalp contusion. IMPRESSION: 1. No acute intracranial abnormality. No skull fracture. 2. Generalized atrophy and chronic small vessel ischemia. Remote  lacunar infarcts in the left thalamus and basal ganglia. Electronically Signed   By: Keith Rake M.D.   On: 03/24/2021 22:17   CT Cervical Spine Wo Contrast  Result Date: 03/24/2021 CLINICAL DATA:  Neck trauma (Age >= 65y) Increased weakness and falls. EXAM: CT CERVICAL SPINE WITHOUT CONTRAST TECHNIQUE: Multidetector CT imaging of the cervical spine was performed without intravenous contrast. Multiplanar CT image reconstructions were also generated. COMPARISON:  Cervical MRI 06/18/2019 FINDINGS: Alignment: Straightening of normal lordosis. No traumatic subluxation. Skull base and vertebrae: No acute fracture. Vertebral body heights are maintained. The dens and skull base are intact. Soft tissues and spinal canal: No prevertebral fluid or swelling. No visible canal hematoma. Disc levels: Diffuse endplate spurring. Disc space narrowing from C4-C5 through C6-C7. There is multilevel facet hypertrophy. Upper chest: No acute or unexpected findings. Other: None. IMPRESSION: Multilevel degenerative change throughout the cervical spine without acute fracture or subluxation. Electronically Signed   By: Keith Rake M.D.   On: 03/24/2021 22:20    Procedures Procedures   Medications Ordered in ED Medications  furosemide (LASIX) injection 40 mg (40 mg Intravenous Given 03/24/21 2223)    ED Course  I have reviewed the triage vital signs and the nursing notes.  Pertinent labs & imaging results that were available during my care of the patient were reviewed by me and considered in my medical decision making (see chart for details).    MDM Rules/Calculators/A&P  Ezel Kope is a 87 y.o. male here presenting with worsening weakness and falls and shortness of breath.  Patient is also on Xarelto and fell today and unable to get up.  Concern for possible subdural hematoma versus worsening CHF.  Also consider pneumonia versus UTI as well.  We will get CT head and neck and chest  x-ray and urinalysis and labs and BNP  10:41 PM Patient's BNP is elevated at 1400.  Troponin is positive likely from demand ischemia and he has no chest pain.  Given Lasix in the ED.  Patient does not have any intracranial bleeding on CT head.  At this point he will be admitted for CHF exacerbation and likely will need a repeat echo and diuresis.    Final Clinical Impression(s) / ED Diagnoses Final diagnoses:  None    Rx / DC Orders ED Discharge Orders     None        Drenda Freeze, MD 03/24/21 2241

## 2021-03-25 ENCOUNTER — Encounter (HOSPITAL_COMMUNITY): Payer: Self-pay | Admitting: Family Medicine

## 2021-03-25 DIAGNOSIS — R4189 Other symptoms and signs involving cognitive functions and awareness: Secondary | ICD-10-CM | POA: Diagnosis not present

## 2021-03-25 DIAGNOSIS — E119 Type 2 diabetes mellitus without complications: Secondary | ICD-10-CM | POA: Diagnosis not present

## 2021-03-25 DIAGNOSIS — I110 Hypertensive heart disease with heart failure: Secondary | ICD-10-CM | POA: Diagnosis not present

## 2021-03-25 DIAGNOSIS — I483 Typical atrial flutter: Secondary | ICD-10-CM | POA: Diagnosis not present

## 2021-03-25 DIAGNOSIS — I5043 Acute on chronic combined systolic (congestive) and diastolic (congestive) heart failure: Secondary | ICD-10-CM

## 2021-03-25 DIAGNOSIS — R778 Other specified abnormalities of plasma proteins: Secondary | ICD-10-CM | POA: Diagnosis not present

## 2021-03-25 DIAGNOSIS — I2510 Atherosclerotic heart disease of native coronary artery without angina pectoris: Secondary | ICD-10-CM | POA: Diagnosis not present

## 2021-03-25 DIAGNOSIS — I495 Sick sinus syndrome: Secondary | ICD-10-CM | POA: Diagnosis not present

## 2021-03-25 DIAGNOSIS — I080 Rheumatic disorders of both mitral and aortic valves: Secondary | ICD-10-CM | POA: Diagnosis not present

## 2021-03-25 DIAGNOSIS — U071 COVID-19: Secondary | ICD-10-CM | POA: Diagnosis not present

## 2021-03-25 DIAGNOSIS — E1140 Type 2 diabetes mellitus with diabetic neuropathy, unspecified: Secondary | ICD-10-CM

## 2021-03-25 DIAGNOSIS — Z79899 Other long term (current) drug therapy: Secondary | ICD-10-CM | POA: Diagnosis not present

## 2021-03-25 DIAGNOSIS — Z8673 Personal history of transient ischemic attack (TIA), and cerebral infarction without residual deficits: Secondary | ICD-10-CM | POA: Diagnosis not present

## 2021-03-25 DIAGNOSIS — I480 Paroxysmal atrial fibrillation: Secondary | ICD-10-CM | POA: Diagnosis not present

## 2021-03-25 DIAGNOSIS — Z85818 Personal history of malignant neoplasm of other sites of lip, oral cavity, and pharynx: Secondary | ICD-10-CM | POA: Diagnosis not present

## 2021-03-25 DIAGNOSIS — Z8349 Family history of other endocrine, nutritional and metabolic diseases: Secondary | ICD-10-CM | POA: Diagnosis not present

## 2021-03-25 DIAGNOSIS — R531 Weakness: Secondary | ICD-10-CM

## 2021-03-25 DIAGNOSIS — I248 Other forms of acute ischemic heart disease: Secondary | ICD-10-CM | POA: Diagnosis not present

## 2021-03-25 DIAGNOSIS — R296 Repeated falls: Secondary | ICD-10-CM | POA: Diagnosis not present

## 2021-03-25 DIAGNOSIS — Z7901 Long term (current) use of anticoagulants: Secondary | ICD-10-CM | POA: Diagnosis not present

## 2021-03-25 DIAGNOSIS — I252 Old myocardial infarction: Secondary | ICD-10-CM | POA: Diagnosis not present

## 2021-03-25 DIAGNOSIS — G4733 Obstructive sleep apnea (adult) (pediatric): Secondary | ICD-10-CM | POA: Diagnosis not present

## 2021-03-25 DIAGNOSIS — N400 Enlarged prostate without lower urinary tract symptoms: Secondary | ICD-10-CM | POA: Diagnosis not present

## 2021-03-25 DIAGNOSIS — K219 Gastro-esophageal reflux disease without esophagitis: Secondary | ICD-10-CM | POA: Diagnosis not present

## 2021-03-25 DIAGNOSIS — Z8249 Family history of ischemic heart disease and other diseases of the circulatory system: Secondary | ICD-10-CM | POA: Diagnosis not present

## 2021-03-25 DIAGNOSIS — Z950 Presence of cardiac pacemaker: Secondary | ICD-10-CM | POA: Diagnosis not present

## 2021-03-25 DIAGNOSIS — R7989 Other specified abnormal findings of blood chemistry: Secondary | ICD-10-CM

## 2021-03-25 LAB — TSH: TSH: 1.852 u[IU]/mL (ref 0.350–4.500)

## 2021-03-25 LAB — GLUCOSE, CAPILLARY
Glucose-Capillary: 125 mg/dL — ABNORMAL HIGH (ref 70–99)
Glucose-Capillary: 138 mg/dL — ABNORMAL HIGH (ref 70–99)
Glucose-Capillary: 123 mg/dL — ABNORMAL HIGH (ref 70–99)
Glucose-Capillary: 122 mg/dL — ABNORMAL HIGH (ref 70–99)

## 2021-03-25 LAB — TROPONIN I (HIGH SENSITIVITY)
Troponin I (High Sensitivity): 92 ng/L — ABNORMAL HIGH (ref <18)
Troponin I (High Sensitivity): 83 ng/L — ABNORMAL HIGH (ref <18)
Troponin I (High Sensitivity): 76 ng/L — ABNORMAL HIGH (ref <18)
Troponin I (High Sensitivity): 74 ng/L — ABNORMAL HIGH (ref <18)

## 2021-03-25 MED ORDER — METOPROLOL SUCCINATE ER 50 MG PO TB24
50.0000 mg | ORAL_TABLET | Freq: Every day | ORAL | Status: DC
  Administered 2021-03-25 – 2021-03-30 (×6): 50 mg via ORAL
  Filled 2021-03-25 (×6): qty 1

## 2021-03-25 MED ORDER — REMDESIVIR 100 MG IV SOLR
100.0000 mg | INTRAVENOUS | Status: AC
Start: 2021-03-25 — End: 2021-03-25
  Administered 2021-03-25 (×2): 100 mg via INTRAVENOUS

## 2021-03-25 MED ORDER — RIVAROXABAN 20 MG PO TABS
20.0000 mg | ORAL_TABLET | Freq: Every day | ORAL | Status: DC
  Administered 2021-03-25: 20 mg via ORAL
  Filled 2021-03-25: qty 1

## 2021-03-25 MED ORDER — FUROSEMIDE 20 MG PO TABS
20.0000 mg | ORAL_TABLET | Freq: Every day | ORAL | Status: DC
  Administered 2021-03-25: 20 mg via ORAL
  Filled 2021-03-25: qty 1

## 2021-03-25 MED ORDER — AMIODARONE HCL 200 MG PO TABS
200.0000 mg | ORAL_TABLET | Freq: Every day | ORAL | Status: DC
  Administered 2021-03-25 – 2021-03-30 (×6): 200 mg via ORAL
  Filled 2021-03-25 (×6): qty 1

## 2021-03-25 MED ORDER — HYDRALAZINE HCL 50 MG PO TABS
50.0000 mg | ORAL_TABLET | Freq: Two times a day (BID) | ORAL | Status: DC
  Administered 2021-03-25 – 2021-03-30 (×11): 50 mg via ORAL
  Filled 2021-03-25 (×11): qty 1

## 2021-03-25 MED ORDER — ACETAMINOPHEN 325 MG PO TABS: 650.0000 mg | ORAL_TABLET | Freq: Four times a day (QID) | ORAL | Status: DC | PRN

## 2021-03-25 MED ORDER — INSULIN ASPART 100 UNIT/ML IJ SOLN: 0.0000 [IU] | Freq: Three times a day (TID) | INTRAMUSCULAR | Status: DC

## 2021-03-25 MED ORDER — DOCUSATE SODIUM 100 MG PO CAPS
100.0000 mg | ORAL_CAPSULE | Freq: Every day | ORAL | Status: DC
  Administered 2021-03-25 – 2021-03-30 (×6): 100 mg via ORAL
  Filled 2021-03-25 (×6): qty 1

## 2021-03-25 MED ORDER — MEMANTINE HCL 10 MG PO TABS
10.0000 mg | ORAL_TABLET | Freq: Two times a day (BID) | ORAL | Status: DC
  Administered 2021-03-25 – 2021-03-30 (×11): 10 mg via ORAL
  Filled 2021-03-25 (×11): qty 1

## 2021-03-25 MED ORDER — TAMSULOSIN HCL 0.4 MG PO CAPS
0.4000 mg | ORAL_CAPSULE | Freq: Every day | ORAL | Status: DC
  Administered 2021-03-25 – 2021-03-30 (×6): 0.4 mg via ORAL
  Filled 2021-03-25 (×6): qty 1

## 2021-03-25 MED ORDER — TRAZODONE HCL 50 MG PO TABS
50.0000 mg | ORAL_TABLET | Freq: Every evening | ORAL | Status: DC | PRN
  Administered 2021-03-27 – 2021-03-29 (×4): 50 mg via ORAL
  Filled 2021-03-25 (×4): qty 1

## 2021-03-25 MED ORDER — MELATONIN 5 MG PO TABS
5.0000 mg | ORAL_TABLET | Freq: Every evening | ORAL | Status: DC | PRN
  Administered 2021-03-27 – 2021-03-28 (×3): 5 mg via ORAL
  Filled 2021-03-25 (×3): qty 1

## 2021-03-25 MED ORDER — ACETAMINOPHEN 650 MG RE SUPP: 650.0000 mg | Freq: Four times a day (QID) | RECTAL | Status: DC | PRN

## 2021-03-25 MED ORDER — LOSARTAN POTASSIUM 50 MG PO TABS
100.0000 mg | ORAL_TABLET | Freq: Every day | ORAL | Status: DC
  Administered 2021-03-25 – 2021-03-30 (×6): 100 mg via ORAL
  Filled 2021-03-25 (×6): qty 2

## 2021-03-25 MED ORDER — SODIUM CHLORIDE 0.9 % IV SOLN
100.0000 mg | Freq: Every day | INTRAVENOUS | Status: AC
  Administered 2021-03-26 – 2021-03-29 (×4): 100 mg via INTRAVENOUS
  Filled 2021-03-25 (×2): qty 20
  Filled 2021-03-25 (×2): qty 100

## 2021-03-25 MED ORDER — POTASSIUM CHLORIDE CRYS ER 10 MEQ PO TBCR
10.0000 meq | EXTENDED_RELEASE_TABLET | Freq: Every day | ORAL | Status: DC
  Administered 2021-03-25: 10 meq via ORAL
  Filled 2021-03-25: qty 1

## 2021-03-25 NOTE — Progress Notes (Signed)
[facility] PROGRESS NOTE    Progress Note  Jose Patel  OZH:[id number] DOB: Nov 29, 1933 DOA: 03/24/2021 PCP: [name], NP     Brief Narrative:   Jose Patel is an 87 y.o. male past medical history of chronic combined systolic and diastolic heart failure with an EF of 40% in December 2021 history of sinus node dysfunction status post pacemaker, atrial fibrillation status post ablation on amiodarone aortic insufficiency sleep apnea who had recently an increase in his Lasix due to worsening lower extremity edema came into the ER because of weakness, cough and a fall, imaging was unremarkable he denies any fever.   Assessment/Plan:   Generalized weakness and fall secondary to COVID-19 virus infection: Agree with IV remdesivir, he has remained afebrile with no leukocytosis. He satting greater 98% on room air. Creatinine seems to be at baseline orthostatics are pending. I have personally reviewed the chest x-ray does not show any infiltrates. Physical therapy has been consulted.  Acute on chronic systolic and diastolic heart failure: Agree with changing back to home dose of Lasix creatinine appears to be baseline. Appears euvolemic on physical exam continue Cozaar metoprolol and hydralazine. Strict I's and O's Daily weight.  Essential hypertension: Continue hydralazine Cozaar metoprolol.  Diabetes mellitus type 2: Presently on no medications A1c 5.7.  Cognitive decline: Continue Namenda.  Atrial fibrillation: Continue amiodarone and Xarelto.  Elevated troponins: Likely demand ischemia denies any chest pain or shortness of breath.  Goals of care: Speaking to the daughter of the patient relates and the daughter confirms that he has had a good life and when it is time for him to go he wanted to be that way. At this point in time I will consult palliative care to address goals of care and follow him up as an outpatient. It is to note that his wife of 50 years  of marriage passed away on 2023-03-30.  DVT prophylaxis: Xarelto Family Communication:Daughter Status is: Observation  The patient will require care spanning > 2 midnights and should be moved to inpatient because: Hemodynamically unstable  Dispo: The patient is from: Home              Anticipated d/c is to: SNF              Patient currently is not medically stable to d/c.   Difficult to place patient No   Code Status:     Code Status Orders  (From admission, onward)           Start     Ordered   03/25/21 0400  Full code  Continuous        03/25/21 0400           Code Status History     Date Active Date Inactive Code Status Order ID Comments User Context   07/07/2019 1641 07/08/2019 1412 Full Code [id number]  Thurmon Fair, MD Inpatient   05/14/2019 1532 05/16/2019 2115 DNR [id number]  Thurmon Fair, MD ED   05/14/2019 0124 05/14/2019 1532 Full Code [id number]  Lonie Peak, MD ED      Advance Directive Documentation    Flowsheet Row Most Recent Value  Type of Advance Directive Healthcare Power of Attorney, Living will  Pre-existing out of facility DNR order (yellow form or pink MOST form) --  "MOST" Form in Place? --         IV Access:   Peripheral IV   Procedures and diagnostic studies:   DG Chest 2 View  Result Date: 03/24/2021 CLINICAL DATA:  Weakness, cough EXAM: CHEST - 2 VIEW COMPARISON:  03/16/2021 FINDINGS: Lungs are clear. No pneumothorax or pleural effusion. Cardiac size is mildly enlarged, unchanged. Left subclavian dual lead pacemaker is unchanged. Osseous structures are age-appropriate. Pulmonary vascularity is normal. IMPRESSION: No active cardiopulmonary disease. Electronically Signed   By: Helyn Numbers M.D.   On: 03/24/2021 22:03   CT HEAD WO CONTRAST ( )  Result Date: 03/24/2021 CLINICAL DATA:  Facial trauma. Patient reports increased weakness and falls. EXAM: CT HEAD WITHOUT CONTRAST TECHNIQUE: Contiguous axial images were  obtained from the base of the skull through the vertex without intravenous contrast. COMPARISON:  None. FINDINGS: Brain: No acute hemorrhage. Generalized cerebral and cerebellar atrophy. Ventricular prominence is likely due to central atrophy. There is moderate to advanced periventricular and deep white matter hypodensity typical of chronic small vessel ischemia. Remote lacunar infarct in the left thalamus and basal ganglia. No evidence of acute ischemia. No subdural or extra-axial collection. No midline shift or mass effect. Vascular: Atherosclerosis of skullbase vasculature without hyperdense vessel or abnormal calcification. Skull: No fracture or focal lesion. Sinuses/Orbits: Mucous retention cyst in the left maxillary sinus. No fracture or acute findings. Bilateral cataract resection. No mastoid effusion. Other: No confluent scalp contusion. IMPRESSION: 1. No acute intracranial abnormality. No skull fracture. 2. Generalized atrophy and chronic small vessel ischemia. Remote lacunar infarcts in the left thalamus and basal ganglia. Electronically Signed   By: Narda Rutherford M.D.   On: 03/24/2021 22:17   CT Cervical Spine Wo Contrast  Result Date: 03/24/2021 CLINICAL DATA:  Neck trauma (Age >= 65y) Increased weakness and falls. EXAM: CT CERVICAL SPINE WITHOUT CONTRAST TECHNIQUE: Multidetector CT imaging of the cervical spine was performed without intravenous contrast. Multiplanar CT image reconstructions were also generated. COMPARISON:  Cervical MRI 06/18/2019 FINDINGS: Alignment: Straightening of normal lordosis. No traumatic subluxation. Skull base and vertebrae: No acute fracture. Vertebral body heights are maintained. The dens and skull base are intact. Soft tissues and spinal canal: No prevertebral fluid or swelling. No visible canal hematoma. Disc levels: Diffuse endplate spurring. Disc space narrowing from C4-C5 through C6-C7. There is multilevel facet hypertrophy. Upper chest: No acute or unexpected  findings. Other: None. IMPRESSION: Multilevel degenerative change throughout the cervical spine without acute fracture or subluxation. Electronically Signed   By: Narda Rutherford M.D.   On: 03/24/2021 22:20     Medical Consultants:   None.   Subjective:    Jose Patel he has no new complains  Objective:    Vitals:   03/25/21 0130 03/25/21 0200 03/25/21 0220 03/25/21 0306  BP: (!) 159/75 (!) 145/70 (!) 173/69 (!) 166/69  Pulse: (!) 59 64 62 68  Resp: 19 15 17 17   Temp:   98 F (36.7 C) 97.9 F (36.6 C)  TempSrc:   Oral Oral  SpO2: 95% 95% 98% 98%  Weight:    76.4 kg  Height:    5\' 9"  (1.753 m)   SpO2: 98 %   Intake/Output Summary (Last 24 hours) at 03/25/2021 0649 Last data filed at 03/25/2021 0154 Gross per 24 hour  Intake 250.91 ml  Output --  Net 250.91 ml   Filed Weights   03/25/21 0306  Weight: 76.4 kg    Exam: General exam: In no acute distress. Respiratory system: Good air movement and clear to auscultation. Cardiovascular system: S1 & S2 heard, RRR. No JVD. Gastrointestinal system: Abdomen is nondistended, soft and nontender.  Extremities: No pedal edema. Skin: No  rashes, lesions or ulcers Psychiatry: Judgement and insight appear normal. Mood & affect appropriate.    Data Reviewed:    Labs: Basic Metabolic Panel: Recent Labs  Lab 03/24/21 1952  NA 137  K 4.3  CL 100  CO2 25  GLUCOSE 151*  BUN 27*  CREATININE 1.24  CALCIUM 8.9   GFR Estimated Creatinine Clearance: 43.6 mL/min (by C-G formula based on SCr of 1.24 mg/dL). Liver Function Tests: No results for input(s): AST, ALT, ALKPHOS, BILITOT, PROT, ALBUMIN in the last 168 hours. No results for input(s): LIPASE, AMYLASE in the last 168 hours. No results for input(s): AMMONIA in the last 168 hours. Coagulation profile No results for input(s): INR, PROTIME in the last 168 hours. COVID-19 Labs  No results for input(s): DDIMER, FERRITIN, LDH, CRP in the last 72 hours.  Lab Results   Component Value Date   SARSCOV2NAA POSITIVE (A) 03/24/2021   SARSCOV2NAA Detected (A) 04/09/2020   SARSCOV2NAA NEGATIVE 07/04/2019   SARSCOV2NAA NEGATIVE 05/14/2019    CBC: Recent Labs  Lab 03/24/21 1952  WBC 10.3  HGB 15.0  HCT 45.3  MCV 95.2  PLT 259   Cardiac Enzymes: No results for input(s): CKTOTAL, CKMB, CKMBINDEX, TROPONINI in the last 168 hours. BNP (last 3 results) No results for input(s): PROBNP in the last 8760 hours. CBG: No results for input(s): GLUCAP in the last 168 hours. D-Dimer: No results for input(s): DDIMER in the last 72 hours. Hgb A1c: No results for input(s): HGBA1C in the last 72 hours. Lipid Profile: No results for input(s): CHOL, HDL, LDLCALC, TRIG, CHOLHDL, LDLDIRECT in the last 72 hours. Thyroid function studies: Recent Labs    03/25/21 0409  TSH 1.852   Anemia work up: No results for input(s): VITAMINB12, FOLATE, FERRITIN, TIBC, IRON, RETICCTPCT in the last 72 hours. Sepsis Labs: Recent Labs  Lab 03/24/21 1952  WBC 10.3   Microbiology Recent Results (from the past 240 hour(s))  Resp Panel by RT-PCR (Flu A&B, Covid) Nasopharyngeal Swab     Status: Abnormal   Collection Time: 03/24/21 10:31 PM   Specimen: Nasopharyngeal Swab; Nasopharyngeal(NP) swabs in vial transport medium  Result Value Ref Range Status   SARS Coronavirus 2 by RT PCR POSITIVE (A) NEGATIVE Final    Comment: RESULT CALLED TO, READ BACK BY AND VERIFIED WITH: KELLIE NEAL RN AT 2323 ON 03/24/21 BY [email or web address] (NOTE) SARS-CoV-2 target nucleic acids are DETECTED.  The SARS-CoV-2 RNA is generally detectable in upper respiratory specimens during the acute phase of infection. Positive results are indicative of the presence of the identified virus, but do not rule out bacterial infection or co-infection with other pathogens not detected by the test. Clinical correlation with patient history and other diagnostic information is necessary to determine patient infection status.  The expected result is Negative.  Fact Sheet for Patients: BloggerCourse.com  Fact Sheet for Healthcare Providers: SeriousBroker.it  This test is not yet approved or cleared by the [place] FDA and  has been authorized for detection and/or diagnosis of SARS-CoV-2 by FDA under an Emergency Use Authorization (EUA).  This EUA will remain in effect (meaning this  test can be used) for the duration of  the COVID-19 declaration under Section 564(b)(1) of the Act, 21 U.S.C. section 360bbb-3(b)(1), unless the authorization is terminated or revoked sooner.     Influenza A by PCR NEGATIVE NEGATIVE Final   Influenza B by PCR NEGATIVE NEGATIVE Final    Comment: (NOTE) The Xpert Xpress SARS-CoV-2/FLU/RSV plus assay is intended as  an aid in the diagnosis of influenza from Nasopharyngeal swab specimens and should not be used as a sole basis for treatment. Nasal washings and aspirates are unacceptable for Xpert Xpress SARS-CoV-2/FLU/RSV testing.  Fact Sheet for Patients: BloggerCourse.com  Fact Sheet for Healthcare Providers: SeriousBroker.it  This test is not yet approved or cleared by the [place] FDA and has been authorized for detection and/or diagnosis of SARS-CoV-2 by FDA under an Emergency Use Authorization (EUA). This EUA will remain in effect (meaning this test can be used) for the duration of the COVID-19 declaration under Section 564(b)(1) of the Act, 21 U.S.C. section 360bbb-3(b)(1), unless the authorization is terminated or revoked.  Performed at [facility], [address] [place], [place] [address]      Medications:    amiodarone  200 mg Oral Daily   docusate sodium  100 mg Oral Daily   furosemide  20 mg Oral Daily   hydrALAZINE  50 mg Oral BID   insulin aspart  0-6 Units Subcutaneous TID WC   losartan  100 mg Oral Daily   memantine  10 mg Oral  BID   metoprolol succinate  50 mg Oral Daily   potassium chloride  10 mEq Oral Daily   rivaroxaban  20 mg Oral Q supper   tamsulosin  0.4 mg Oral Daily   Continuous Infusions:  [START ON 03/26/2021] remdesivir 100 mg in NS 100 mL        LOS: 0 days   Marinda Elk  [facility]  03/25/2021, 6:49 AM

## 2021-03-25 NOTE — Plan of Care (Signed)

## 2021-03-25 NOTE — H&P (Signed)
History and Physical    Jose Patel UUV:[id number] DOB: 1933-10-18 DOA: 03/24/2021  PCP: [name], NP  Patient coming from: Home.  Chief Complaint: Generalized weakness and falls and cough.  HPI: Jose Patel is a 87 y.o. male with history of chronic combined systolic and diastolic heart failure last EF measured was 40 to 45% in December 2021 with history of sinus node dysfunction status post pacemaker placement, history of atrial fibrillation status post ablation, aortic insufficiency sleep apnea diabetes mellitus who has been recently getting increased dose of Lasix due to worsening edema was brought to the ER because of weakness cough and fall.  Patient over the last 5 days has been having increasing weakness had a fall and also has been having nonproductive cough.  Denies any chest pain.  ED Course: In the ER CT head and C-spine unremarkable chest x-ray did not show anything acute.  COVID test came back positive.  Labs show BNP of 1400 high sensitive troponin was 115 but it showed a decreasing trend.  CBC unremarkable.  Patient was given 1 dose of Lasix 40 mg IV and after discussing with patient patient was started on remdesivir for COVID.  Admitted for further work-up.  Review of Systems: As per HPI, rest all negative.   Past Medical History:  Diagnosis Date   A-fib (HCC)    Anticoagulated on Coumadin    Per records from [facility]    Aortic regurgitation    Aortic stenosis    CHF (congestive heart failure) (HCC)    Coronary artery disease    CVA (cerebral vascular accident) (HCC)    hx of CVA noted on CT from 02/19/18   Diabetes mellitus, type 2 (HCC)    Gastro-esophageal reflux disease without esophagitis    Per records from previous provider, [facility]   Heart attack [facility])    History of CT scan of head 02/19/2018   Per records from previous provider, [facility]. Chronic changes small vessel disease    History of ECG    03/26/14- Sinus Tachycardia @ 107 bmp, QRS 78 msec, QT 298 msec, QTc 361 msec. 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 [facility])    Per records from previous provider, [facility]    MI (myocardial infarction) [facility])    Per records from [facility]    Mitral regurgitation    Mitral stenosis    Other intervertebral disc degeneration, lumbar region    Per records from previous provider, [facility]   Radiculopathy, lumbar region    Per records from previous provider, [facility]   Sinus bradycardia    Per records from [facility]    Sleep apnea    Transient ischemic attack    10 to 12 years ago   Typical atrial flutter [facility])    Per records from [facility]     Past Surgical History:  Procedure Laterality Date   CARDIAC ELECTROPHYSIOLOGY STUDY AND ABLATION     CARDIOVERSION N/A 12/26/2018   Procedure: CARDIOVERSION;  Surgeon: Chrystie Nose, MD;  Location: [facility] ENDOSCOPY;  Service: Cardiovascular;  Laterality: N/A;   CARDIOVERSION N/A 05/14/2019   Procedure: CARDIOVERSION;  Surgeon: Thurmon Fair, MD;  Location:  [facility] ENDOSCOPY;  Service: Cardiovascular;  Laterality: N/A;   CATARACT EXTRACTION     Debridement to right arm with MRSA infection     4 years ago   GALLBLADDER SURGERY     Per records from [facility]    PACEMAKER IMPLANT N/A 07/07/2019   Procedure: PACEMAKER IMPLANT;  Surgeon: Thurmon Fair, MD;  Location: [facility] INVASIVE CV LAB;  Service: Cardiovascular;  Laterality: N/A;   TEE WITHOUT CARDIOVERSION N/A 12/26/2018   Procedure: TRANSESOPHAGEAL ECHOCARDIOGRAM (TEE);  Surgeon: Chrystie Nose, MD;  Location: [facility] ENDOSCOPY;  Service: Cardiovascular;  Laterality: N/A;     reports that he  quit smoking about 67 years ago. His smoking use included cigarettes and cigars. He has never used smokeless tobacco. He reports that he does not drink alcohol and does not use drugs.  Allergies  Allergen Reactions   Aricept [Donepezil] Diarrhea, Nausea And Vomiting and Other (See Comments)    Cannot tolerate- bradycardia, also   Tropicamide Nausea And Vomiting    Used to dilate eyes (might be this- was switched to something this was tolerable, after this)   Adhesive [Tape] Rash    Family History  Problem Relation Age of Onset   Heart failure Mother    Hyperlipidemia Mother    Hypertension Son    Hyperlipidemia Son    Arthritis Son    Arthritis Daughter    Hypertension Son     Prior to Admission medications   Medication Sig Start Date End Date Taking? Authorizing Provider  amiodarone (PACERONE) 200 MG tablet Take 1 tablet (200 mg total) by mouth daily. 11/01/20   Croitoru, Mihai, MD  amoxicillin (AMOXIL) 500 MG capsule Take 2,000 mg by mouth See admin instructions. Take 2,000 mg by mouth one hour prior to dental appointment 04/17/19   [provider]  Cholecalciferol (VITAMIN D3) 50 MCG (2000 UT) TABS Take 2,000 Units by mouth daily with breakfast.    [provider]  docusate sodium (COLACE) 100 MG capsule Take 100 mg by mouth daily.    [provider]  furosemide (LASIX) 20 MG tablet Take 20 mg daily as needed Sun, Tuesday, Thursday, and Saturday 07/28/20   Croitoru, Mihai, MD  gabapentin (NEURONTIN) 100 MG capsule Take 1 capsule (100 mg total) by mouth daily as needed. 08/11/19   [name], NP  Homeopathic Products (SLEEP MEDICINE PO) Take by mouth as needed.    [provider]  hydrALAZINE (APRESOLINE) 50 MG tablet Take 1 tablet (50 mg total) by mouth 2 (two) times daily. 02/02/21   [name], NP  losartan (COZAAR) 100 MG tablet Take 1 tablet (100 mg total) by mouth daily. 02/02/21   [name], NP  Melatonin 5 MG TABS Take 5  mg by mouth at bedtime as needed (for sleep).     [provider]  memantine (NAMENDA) 10 MG tablet Take 1 tablet (10 mg total) by mouth 2 (two) times daily. 02/02/21   [name], NP  metoprolol succinate (TOPROL-XL) 50 MG 24 hr tablet Take 1 tablet (50 mg total) by mouth daily. 07/28/20   Croitoru, Mihai, MD  Multiple Vitamin (MULTIVITAMIN) tablet Take 1 tablet by mouth daily.    [provider]  nystatin cream (MYCOSTATIN) Apply 1 application topically 2 (two) times daily. 02/25/21   [name], NP  potassium chloride (KLOR-CON) 10 MEQ tablet Take 1 tablet (10 mEq total) by mouth daily. 03/17/21   [name], NP  rivaroxaban Carlena Hurl)  20 MG TABS tablet Take 1 tablet (20 mg total) by mouth daily with supper. 02/02/21   [name], NP  tamsulosin (FLOMAX) 0.4 MG CAPS capsule Take 1 capsule (0.4 mg total) by mouth daily. 02/02/21   [name], NP  traZODone (DESYREL) 50 MG tablet To start with 25 mg by mouth at bedtime, can increase to 50 mg 03/16/21   [name], NP    Physical Exam: Constitutional: Moderately built and nourished. Vitals:   03/25/21 0130 03/25/21 0200 03/25/21 0220 03/25/21 0306  BP: (!) 159/75 (!) 145/70 (!) 173/69 (!) 166/69  Pulse: (!) 59 64 62 68  Resp: 19 15 17 17   Temp:   98 F (36.7 C) 97.9 F (36.6 C)  TempSrc:   Oral Oral  SpO2: 95% 95% 98% 98%  Weight:    76.4 kg  Height:    5\' 9"  (1.753 m)   Eyes: Anicteric no pallor. ENMT: No discharge from the ears eyes nose and mouth. Neck: No mass felt.  No neck rigidity.  No JVD appreciated. Respiratory: No rhonchi or crepitations. Cardiovascular: S1-S2 heard. Abdomen: Soft nontender bowel sound present. Musculoskeletal: No edema. Skin: Chronic skin changes. Neurologic: Alert awake oriented to time place and person.  Moves all extremities. Psychiatric: Appears normal.  Normal affect.   Labs on Admission: I have personally reviewed following labs and  imaging studies  CBC: Recent Labs  Lab 03/24/21 1952  WBC 10.3  HGB 15.0  HCT 45.3  MCV 95.2  PLT 259   Basic Metabolic Panel: Recent Labs  Lab 03/24/21 1952  NA 137  K 4.3  CL 100  CO2 25  GLUCOSE 151*  BUN 27*  CREATININE 1.24  CALCIUM 8.9   GFR: Estimated Creatinine Clearance: 43.6 mL/min (by C-G formula based on SCr of 1.24 mg/dL). Liver Function Tests: No results for input(s): AST, ALT, ALKPHOS, BILITOT, PROT, ALBUMIN in the last 168 hours. No results for input(s): LIPASE, AMYLASE in the last 168 hours. No results for input(s): AMMONIA in the last 168 hours. Coagulation Profile: No results for input(s): INR, PROTIME in the last 168 hours. Cardiac Enzymes: No results for input(s): CKTOTAL, CKMB, CKMBINDEX, TROPONINI in the last 168 hours. BNP (last 3 results) No results for input(s): PROBNP in the last 8760 hours. HbA1C: No results for input(s): HGBA1C in the last 72 hours. CBG: No results for input(s): GLUCAP in the last 168 hours. Lipid Profile: No results for input(s): CHOL, HDL, LDLCALC, TRIG, CHOLHDL, LDLDIRECT in the last 72 hours. Thyroid Function Tests: No results for input(s): TSH, T4TOTAL, FREET4, T3FREE, THYROIDAB in the last 72 hours. Anemia Panel: No results for input(s): VITAMINB12, FOLATE, FERRITIN, TIBC, IRON, RETICCTPCT in the last 72 hours. Urine analysis:    Component Value Date/Time   COLORURINE YELLOW 03/24/2021 1954   APPEARANCEUR CLEAR 03/24/2021 1954   LABSPEC 1.010 03/24/2021 1954   PHURINE 6.5 03/24/2021 1954   GLUCOSEU NEGATIVE 03/24/2021 1954   HGBUR NEGATIVE 03/24/2021 1954   BILIRUBINUR NEGATIVE 03/24/2021 1954   BILIRUBINUR small 08/12/2018 1250   KETONESUR NEGATIVE 03/24/2021 1954   PROTEINUR NEGATIVE 03/24/2021 1954   UROBILINOGEN negative (A) 08/12/2018 1250   NITRITE NEGATIVE 03/24/2021 1954   LEUKOCYTESUR NEGATIVE 03/24/2021 1954   Sepsis Labs: @LABRCNTIP (procalcitonin:4,lacticidven:4) ) Recent Results (from  the past 240 hour(s))  Resp Panel by RT-PCR (Flu A&B, Covid) Nasopharyngeal Swab     Status: Abnormal   Collection Time: 03/24/21 10:31 PM   Specimen: Nasopharyngeal Swab; Nasopharyngeal(NP) swabs in  vial transport medium  Result Value Ref Range Status   SARS Coronavirus 2 by RT PCR POSITIVE (A) NEGATIVE Final    Comment: RESULT CALLED TO, READ BACK BY AND VERIFIED WITH: KELLIE NEAL RN AT 2323 ON 03/24/21 BY [email or web address] (NOTE) SARS-CoV-2 target nucleic acids are DETECTED.  The SARS-CoV-2 RNA is generally detectable in upper respiratory specimens during the acute phase of infection. Positive results are indicative of the presence of the identified virus, but do not rule out bacterial infection or co-infection with other pathogens not detected by the test. Clinical correlation with patient history and other diagnostic information is necessary to determine patient infection status. The expected result is Negative.  Fact Sheet for Patients: BloggerCourse.com  Fact Sheet for Healthcare Providers: SeriousBroker.it  This test is not yet approved or cleared by the [place] FDA and  has been authorized for detection and/or diagnosis of SARS-CoV-2 by FDA under an Emergency Use Authorization (EUA).  This EUA will remain in effect (meaning this  test can be used) for the duration of  the COVID-19 declaration under Section 564(b)(1) of the Act, 21 U.S.C. section 360bbb-3(b)(1), unless the authorization is terminated or revoked sooner.     Influenza A by PCR NEGATIVE NEGATIVE Final   Influenza B by PCR NEGATIVE NEGATIVE Final    Comment: (NOTE) The Xpert Xpress SARS-CoV-2/FLU/RSV plus assay is intended as an aid in the diagnosis of influenza from Nasopharyngeal swab specimens and should not be used as a sole basis for treatment. Nasal washings and aspirates are unacceptable for Xpert Xpress SARS-CoV-2/FLU/RSV testing.  Fact Sheet for  Patients: BloggerCourse.com  Fact Sheet for Healthcare Providers: SeriousBroker.it  This test is not yet approved or cleared by the [place] FDA and has been authorized for detection and/or diagnosis of SARS-CoV-2 by FDA under an Emergency Use Authorization (EUA). This EUA will remain in effect (meaning this test can be used) for the duration of the COVID-19 declaration under Section 564(b)(1) of the Act, 21 U.S.C. section 360bbb-3(b)(1), unless the authorization is terminated or revoked.  Performed at [facility], [address] [place], [place] [address]      Radiological Exams on Admission: DG Chest 2 View  Result Date: 03/24/2021 CLINICAL DATA:  Weakness, cough EXAM: CHEST - 2 VIEW COMPARISON:  03/16/2021 FINDINGS: Lungs are clear. No pneumothorax or pleural effusion. Cardiac size is mildly enlarged, unchanged. Left subclavian dual lead pacemaker is unchanged. Osseous structures are age-appropriate. Pulmonary vascularity is normal. IMPRESSION: No active cardiopulmonary disease. Electronically Signed   By: Helyn Numbers M.D.   On: 03/24/2021 22:03   CT HEAD WO CONTRAST ( )  Result Date: 03/24/2021 CLINICAL DATA:  Facial trauma. Patient reports increased weakness and falls. EXAM: CT HEAD WITHOUT CONTRAST TECHNIQUE: Contiguous axial images were obtained from the base of the skull through the vertex without intravenous contrast. COMPARISON:  None. FINDINGS: Brain: No acute hemorrhage. Generalized cerebral and cerebellar atrophy. Ventricular prominence is likely due to central atrophy. There is moderate to advanced periventricular and deep white matter hypodensity typical of chronic small vessel ischemia. Remote lacunar infarct in the left thalamus and basal ganglia. No evidence of acute ischemia. No subdural or extra-axial collection. No midline shift or mass effect. Vascular: Atherosclerosis of skullbase vasculature  without hyperdense vessel or abnormal calcification. Skull: No fracture or focal lesion. Sinuses/Orbits: Mucous retention cyst in the left maxillary sinus. No fracture or acute findings. Bilateral cataract resection. No mastoid effusion. Other: No confluent scalp contusion. IMPRESSION: 1. No  acute intracranial abnormality. No skull fracture. 2. Generalized atrophy and chronic small vessel ischemia. Remote lacunar infarcts in the left thalamus and basal ganglia. Electronically Signed   By: Narda Rutherford M.D.   On: 03/24/2021 22:17   CT Cervical Spine Wo Contrast  Result Date: 03/24/2021 CLINICAL DATA:  Neck trauma (Age >= 65y) Increased weakness and falls. EXAM: CT CERVICAL SPINE WITHOUT CONTRAST TECHNIQUE: Multidetector CT imaging of the cervical spine was performed without intravenous contrast. Multiplanar CT image reconstructions were also generated. COMPARISON:  Cervical MRI 06/18/2019 FINDINGS: Alignment: Straightening of normal lordosis. No traumatic subluxation. Skull base and vertebrae: No acute fracture. Vertebral body heights are maintained. The dens and skull base are intact. Soft tissues and spinal canal: No prevertebral fluid or swelling. No visible canal hematoma. Disc levels: Diffuse endplate spurring. Disc space narrowing from C4-C5 through C6-C7. There is multilevel facet hypertrophy. Upper chest: No acute or unexpected findings. Other: None. IMPRESSION: Multilevel degenerative change throughout the cervical spine without acute fracture or subluxation. Electronically Signed   By: Narda Rutherford M.D.   On: 03/24/2021 22:20    EKG: Independently reviewed.  Paced rhythm.  Assessment/Plan Principal Problem:   COVID-19 virus infection Active Problems:   Essential hypertension   Diabetes mellitus, type 2 (HCC)   Paroxysmal atrial fibrillation (HCC)   Acute on chronic combined systolic and diastolic CHF (congestive heart failure) (HCC)   Elevated troponin   Weakness    Generalized  weakness with falls and COVID infection  -patient's weakness could be likely from COVID infection.  Patient was started on remdesivir.  We will follow inflammatory markers.  In addition we will check TSH check orthostatics.  Get physical therapy consult. Acute on chronic systolic and diastolic heart failure last EF measured was 40 to 45% in December 2021.  Was given Lasix 40 mg IV in the ER.  Appears compensated at this time.  We will continue his home dose of Lasix 20 mg.  Closely follow intake output.  Patient also takes Cozaar metoprolol. History of hypertension on hydralazine Cozaar and metoprolol. Diabetes mellitus type 2 presently not on any medications.  Last hemoglobin A1c was 5.7 last month.  We will keep patient on very sensitive sliding scale. Cognitive decline on Namenda. A. fib on amiodarone and Xarelto. Elevated troponin which is showing a declining trend.  Denies any chest pain.  Will need to discuss with patient's daughter to get further history.   DVT prophylaxis: Xarelto. Code Status: Full code. Family Communication: Will need to discuss with patient's daughter. Disposition Plan: Home. Consults called: Physical therapy. Admission status: Observation.   Eduard Clos MD [facility] Pager [phone].  If 7PM-7AM, please contact night-coverage [email or web address] Password [email or web address]  03/25/2021, 4:01 AM

## 2021-03-25 NOTE — Progress Notes (Signed)
Heart Failure Navigator Progress Note  Assessed for Heart & Vascular TOC clinic readiness.  Patient does not meet criteria due to admission secondary to COVID-19 infection. Euvolemic on MD exam. .   Navigator available for reassessment of patient.   Kerby Nora, PharmD, BCPS Heart Failure Stewardship Pharmacist Phone [phone]

## 2021-03-26 DIAGNOSIS — I5043 Acute on chronic combined systolic (congestive) and diastolic (congestive) heart failure: Secondary | ICD-10-CM | POA: Diagnosis not present

## 2021-03-26 DIAGNOSIS — I5033 Acute on chronic diastolic (congestive) heart failure: Secondary | ICD-10-CM | POA: Diagnosis not present

## 2021-03-26 DIAGNOSIS — I110 Hypertensive heart disease with heart failure: Secondary | ICD-10-CM

## 2021-03-26 DIAGNOSIS — I480 Paroxysmal atrial fibrillation: Secondary | ICD-10-CM

## 2021-03-26 DIAGNOSIS — I10 Essential (primary) hypertension: Secondary | ICD-10-CM | POA: Diagnosis not present

## 2021-03-26 DIAGNOSIS — U071 COVID-19: Secondary | ICD-10-CM | POA: Diagnosis not present

## 2021-03-26 DIAGNOSIS — E1140 Type 2 diabetes mellitus with diabetic neuropathy, unspecified: Secondary | ICD-10-CM | POA: Diagnosis not present

## 2021-03-26 LAB — COMPREHENSIVE METABOLIC PANEL
ALT: 186 U/L — ABNORMAL HIGH (ref 0–44)
AST: 154 U/L — ABNORMAL HIGH (ref 15–41)
Albumin: 2.8 g/dL — ABNORMAL LOW (ref 3.5–5.0)
Alkaline Phosphatase: 88 U/L (ref 38–126)
Anion gap: 8 (ref 5–15)
BUN: 32 mg/dL — ABNORMAL HIGH (ref 8–23)
CO2: 26 mmol/L (ref 22–32)
Calcium: 8.5 mg/dL — ABNORMAL LOW (ref 8.9–10.3)
Chloride: 104 mmol/L (ref 98–111)
Creatinine, Ser: 1.3 mg/dL — ABNORMAL HIGH (ref 0.61–1.24)
GFR, Estimated: 54 mL/min — ABNORMAL LOW (ref >60)
Glucose, Bld: 110 mg/dL — ABNORMAL HIGH (ref 70–99)
Potassium: 3.3 mmol/L — ABNORMAL LOW (ref 3.5–5.1)
Sodium: 138 mmol/L (ref 135–145)
Total Bilirubin: 1 mg/dL (ref 0.3–1.2)
Total Protein: 5.9 g/dL — ABNORMAL LOW (ref 6.5–8.1)

## 2021-03-26 LAB — CBC WITH DIFFERENTIAL/PLATELET
Abs Immature Granulocytes: 0.04 10*3/uL (ref 0.00–0.07)
Basophils Absolute: 0 10*3/uL (ref 0.0–0.1)
Basophils Relative: 0 %
Eosinophils Absolute: 0 10*3/uL (ref 0.0–0.5)
Eosinophils Relative: 0 %
HCT: 40.3 % (ref 39.0–52.0)
Hemoglobin: 13.8 g/dL (ref 13.0–17.0)
Immature Granulocytes: 1 %
Lymphocytes Relative: 15 %
Lymphs Abs: 1.1 10*3/uL (ref 0.7–4.0)
MCH: 31.9 pg (ref 26.0–34.0)
MCHC: 34.2 g/dL (ref 30.0–36.0)
MCV: 93.3 fL (ref 80.0–100.0)
Monocytes Absolute: 0.8 10*3/uL (ref 0.1–1.0)
Monocytes Relative: 12 %
Neutro Abs: 5 10*3/uL (ref 1.7–7.7)
Neutrophils Relative %: 72 %
Platelets: 228 10*3/uL (ref 150–400)
RBC: 4.32 MIL/uL (ref 4.22–5.81)
RDW: 14.6 % (ref 11.5–15.5)
WBC: 6.9 10*3/uL (ref 4.0–10.5)
nRBC: 0 % (ref 0.0–0.2)

## 2021-03-26 LAB — C-REACTIVE PROTEIN: CRP: 4.7 mg/dL — ABNORMAL HIGH (ref <1.0)

## 2021-03-26 LAB — D-DIMER, QUANTITATIVE: D-Dimer, Quant: 0.42 ug/mL-FEU (ref 0.00–0.50)

## 2021-03-26 MED ORDER — POTASSIUM CHLORIDE CRYS ER 20 MEQ PO TBCR
40.0000 meq | EXTENDED_RELEASE_TABLET | Freq: Two times a day (BID) | ORAL | Status: AC
  Administered 2021-03-26 (×2): 40 meq via ORAL
  Filled 2021-03-26 (×2): qty 2

## 2021-03-26 MED ORDER — POLYETHYLENE GLYCOL 3350 17 G PO PACK
17.0000 g | PACK | Freq: Two times a day (BID) | ORAL | Status: AC
Start: 2021-03-26 — End: 2021-03-28
  Administered 2021-03-26 – 2021-03-28 (×3): 17 g via ORAL
  Filled 2021-03-26 (×4): qty 1

## 2021-03-26 MED ORDER — YOU HAVE A PACEMAKER BOOK
Freq: Once | Status: DC
  Filled 2021-03-26: qty 1

## 2021-03-26 MED ORDER — ONDANSETRON HCL 4 MG/2ML IJ SOLN
4.0000 mg | Freq: Four times a day (QID) | INTRAMUSCULAR | Status: DC | PRN
  Administered 2021-03-26: 4 mg via INTRAVENOUS
  Filled 2021-03-26: qty 2

## 2021-03-26 MED ORDER — FUROSEMIDE 20 MG PO TABS
20.0000 mg | ORAL_TABLET | Freq: Every day | ORAL | Status: DC
  Administered 2021-03-26 – 2021-03-30 (×5): 20 mg via ORAL
  Filled 2021-03-26 (×5): qty 1

## 2021-03-26 MED ORDER — POTASSIUM CHLORIDE CRYS ER 10 MEQ PO TBCR
10.0000 meq | EXTENDED_RELEASE_TABLET | Freq: Every day | ORAL | Status: DC
  Administered 2021-03-27 – 2021-03-30 (×4): 10 meq via ORAL
  Filled 2021-03-26 (×5): qty 1

## 2021-03-26 MED ORDER — RIVAROXABAN 15 MG PO TABS
15.0000 mg | ORAL_TABLET | Freq: Every day | ORAL | Status: DC
  Administered 2021-03-26 – 2021-03-29 (×3): 15 mg via ORAL
  Filled 2021-03-26 (×3): qty 1

## 2021-03-26 NOTE — Progress Notes (Signed)
Throughout today, patient noted to have a depressed affect with little or no motivation to do even the simpliest of tasks.  Several times he appeared weepy.  Refusing medications........ states "what's the sense of it all anyway?"  Despite persistent persuasion, it is clear that he does not feel life is worth living since his wife passed away.  He has accepted calls from friends and family, however.  He appears genuinely happy to converse with them.

## 2021-03-26 NOTE — Progress Notes (Signed)
[facility] PROGRESS NOTE    Progress Note  Jose Patel  GLO:[id number] DOB: 01-02-1934 DOA: 03/24/2021 PCP: [name], NP     Brief Narrative:   Jose Patel is an 87 y.o. male past medical history of chronic combined systolic and diastolic heart failure with an EF of 40% in December 2021 history of sinus node dysfunction status post pacemaker, atrial fibrillation status post ablation on amiodarone aortic insufficiency sleep apnea who had recently an increase in his Lasix due to worsening lower extremity edema came into the ER because of weakness, cough and a fall, imaging was unremarkable he denies any fever.   Assessment/Plan:   Generalized weakness and fall secondary to COVID-19 virus infection: Continue IV remdesivir satting greater 94% on room air. Orthostatics are still pending.  Chest x-ray does not show any infiltrates. Physical therapy evaluation is pending  Acute on chronic systolic and diastolic heart failure: Appears in euvolemic on physical exam. Continue current home regimen, except for Lasix we will hold it for today restart tomorrow. Continue strict I's and O's and daily weights monitor electrolytes and replete as needed.  Essential hypertension: Continue hydralazine Cozaar metoprolol.  Diabetes mellitus type 2: Presently on no medications A1c 5.7.  Cognitive decline: Continue Namenda.  Atrial fibrillation: Continue amiodarone and Xarelto.  Elevated troponins: Likely demand ischemia denies any chest pain or shortness of breath.  Goals of care: Speaking to the daughter of the patient relates and the daughter confirms that he has had a good life and when it is time for him to go he wanted to be that way. At this point in time I will consult palliative care to address goals of care and follow him up as an outpatient. It is to note that his wife of 52 years of marriage passed away on 03-12-23.  DVT prophylaxis: Xarelto Family  Communication:Daughter Status is: Observation  The patient will require care spanning > 2 midnights and should be moved to inpatient because: Hemodynamically unstable  Dispo: The patient is from: Home              Anticipated d/c is to: SNF              Patient currently is not medically stable to d/c.   Difficult to place patient No   Code Status:     Code Status Orders  (From admission, onward)           Start     Ordered   03/25/21 0400  Full code  Continuous        03/25/21 0400           Code Status History     Date Active Date Inactive Code Status Order ID Comments User Context   07/07/2019 1641 07/08/2019 1412 Full Code [id number]  Thurmon Fair, MD Inpatient   05/14/2019 1532 05/16/2019 2115 DNR [id number]  Thurmon Fair, MD ED   05/14/2019 0124 05/14/2019 1532 Full Code [id number]  Lonie Peak, MD ED      Advance Directive Documentation    Flowsheet Row Most Recent Value  Type of Advance Directive Healthcare Power of Attorney, Living will  Pre-existing out of facility DNR order (yellow form or pink MOST form) --  "MOST" Form in Place? --         IV Access:   Peripheral IV   Procedures and diagnostic studies:   DG Chest 2 View  Result Date: 03/24/2021 CLINICAL DATA:  Weakness, cough EXAM: CHEST - 2  VIEW COMPARISON:  03/16/2021 FINDINGS: Lungs are clear. No pneumothorax or pleural effusion. Cardiac size is mildly enlarged, unchanged. Left subclavian dual lead pacemaker is unchanged. Osseous structures are age-appropriate. Pulmonary vascularity is normal. IMPRESSION: No active cardiopulmonary disease. Electronically Signed   By: Helyn Numbers M.D.   On: 03/24/2021 22:03   CT HEAD WO CONTRAST ( )  Result Date: 03/24/2021 CLINICAL DATA:  Facial trauma. Patient reports increased weakness and falls. EXAM: CT HEAD WITHOUT CONTRAST TECHNIQUE: Contiguous axial images were obtained from the base of the skull through the vertex without intravenous  contrast. COMPARISON:  None. FINDINGS: Brain: No acute hemorrhage. Generalized cerebral and cerebellar atrophy. Ventricular prominence is likely due to central atrophy. There is moderate to advanced periventricular and deep white matter hypodensity typical of chronic small vessel ischemia. Remote lacunar infarct in the left thalamus and basal ganglia. No evidence of acute ischemia. No subdural or extra-axial collection. No midline shift or mass effect. Vascular: Atherosclerosis of skullbase vasculature without hyperdense vessel or abnormal calcification. Skull: No fracture or focal lesion. Sinuses/Orbits: Mucous retention cyst in the left maxillary sinus. No fracture or acute findings. Bilateral cataract resection. No mastoid effusion. Other: No confluent scalp contusion. IMPRESSION: 1. No acute intracranial abnormality. No skull fracture. 2. Generalized atrophy and chronic small vessel ischemia. Remote lacunar infarcts in the left thalamus and basal ganglia. Electronically Signed   By: Narda Rutherford M.D.   On: 03/24/2021 22:17   CT Cervical Spine Wo Contrast  Result Date: 03/24/2021 CLINICAL DATA:  Neck trauma (Age >= 65y) Increased weakness and falls. EXAM: CT CERVICAL SPINE WITHOUT CONTRAST TECHNIQUE: Multidetector CT imaging of the cervical spine was performed without intravenous contrast. Multiplanar CT image reconstructions were also generated. COMPARISON:  Cervical MRI 06/18/2019 FINDINGS: Alignment: Straightening of normal lordosis. No traumatic subluxation. Skull base and vertebrae: No acute fracture. Vertebral body heights are maintained. The dens and skull base are intact. Soft tissues and spinal canal: No prevertebral fluid or swelling. No visible canal hematoma. Disc levels: Diffuse endplate spurring. Disc space narrowing from C4-C5 through C6-C7. There is multilevel facet hypertrophy. Upper chest: No acute or unexpected findings. Other: None. IMPRESSION: Multilevel degenerative change throughout  the cervical spine without acute fracture or subluxation. Electronically Signed   By: Narda Rutherford M.D.   On: 03/24/2021 22:20     Medical Consultants:   None.   Subjective:    Jose Patel feels better than yesterday.  Objective:    Vitals:   03/25/21 1145 03/25/21 1647 03/25/21 2019 03/26/21 0246  BP: (!) 130/108 (!) 126/47 (!) 147/59 (!) 158/69  Pulse: 61 (!) 59 (!) 59 61  Resp: 18 18 17 18   Temp: 98.6 F (37 C) 98.3 F (36.8 C) 98.5 F (36.9 C) 97.7 F (36.5 C)  TempSrc: Oral Oral Oral Oral  SpO2: 98% 97% 94% 94%  Weight:    73.3 kg  Height:       SpO2: 94 %   Intake/Output Summary (Last 24 hours) at 03/26/2021 0741 Last data filed at 03/25/2021 1647 Gross per 24 hour  Intake 240 ml  Output 100 ml  Net 140 ml    Filed Weights   03/25/21 0306 03/26/21 0246  Weight: 76.4 kg 73.3 kg    Exam: General exam: In no acute distress. Respiratory system: Good air movement and clear to auscultation. Cardiovascular system: S1 & S2 heard, RRR. No JVD. Gastrointestinal system: Abdomen is nondistended, soft and nontender.  Extremities: No pedal edema. Skin: No rashes, lesions  or ulcers   Data Reviewed:    Labs: Basic Metabolic Panel: Recent Labs  Lab 03/24/21 1952 03/26/21 0403  NA 137 138  K 4.3 3.3*  CL 100 104  CO2 25 26  GLUCOSE 151* 110*  BUN 27* 32*  CREATININE 1.24 1.30*  CALCIUM 8.9 8.5*    GFR Estimated Creatinine Clearance: 41.5 mL/min (A) (by C-G formula based on SCr of 1.3 mg/dL (H)). Liver Function Tests: Recent Labs  Lab 03/26/21 0403  AST 154*  ALT 186*  ALKPHOS 88  BILITOT 1.0  PROT 5.9*  ALBUMIN 2.8*   No results for input(s): LIPASE, AMYLASE in the last 168 hours. No results for input(s): AMMONIA in the last 168 hours. Coagulation profile No results for input(s): INR, PROTIME in the last 168 hours. COVID-19 Labs  Recent Labs    03/26/21 0403  DDIMER 0.42  CRP 4.7*    Lab Results  Component Value Date    SARSCOV2NAA POSITIVE (A) 03/24/2021   SARSCOV2NAA Detected (A) 04/09/2020   SARSCOV2NAA NEGATIVE 07/04/2019   SARSCOV2NAA NEGATIVE 05/14/2019    CBC: Recent Labs  Lab 03/24/21 1952 03/26/21 0403  WBC 10.3 6.9  NEUTROABS  --  5.0  HGB 15.0 13.8  HCT 45.3 40.3  MCV 95.2 93.3  PLT 259 228    Cardiac Enzymes: No results for input(s): CKTOTAL, CKMB, CKMBINDEX, TROPONINI in the last 168 hours. BNP (last 3 results) No results for input(s): PROBNP in the last 8760 hours. CBG: Recent Labs  Lab 03/25/21 0710 03/25/21 1145 03/25/21 1646 03/25/21 2038  GLUCAP 125* 138* 123* 122*   D-Dimer: Recent Labs    03/26/21 0403  DDIMER 0.42   Hgb A1c: No results for input(s): HGBA1C in the last 72 hours. Lipid Profile: No results for input(s): CHOL, HDL, LDLCALC, TRIG, CHOLHDL, LDLDIRECT in the last 72 hours. Thyroid function studies: Recent Labs    03/25/21 0409  TSH 1.852    Anemia work up: No results for input(s): VITAMINB12, FOLATE, FERRITIN, TIBC, IRON, RETICCTPCT in the last 72 hours. Sepsis Labs: Recent Labs  Lab 03/24/21 1952 03/26/21 0403  WBC 10.3 6.9    Microbiology Recent Results (from the past 240 hour(s))  Resp Panel by RT-PCR (Flu A&B, Covid) Nasopharyngeal Swab     Status: Abnormal   Collection Time: 03/24/21 10:31 PM   Specimen: Nasopharyngeal Swab; Nasopharyngeal(NP) swabs in vial transport medium  Result Value Ref Range Status   SARS Coronavirus 2 by RT PCR POSITIVE (A) NEGATIVE Final    Comment: RESULT CALLED TO, READ BACK BY AND VERIFIED WITH: KELLIE NEAL RN AT 2323 ON 03/24/21 BY I.[email or web address] (NOTE) SARS-CoV-2 target nucleic acids are DETECTED.  The SARS-CoV-2 RNA is generally detectable in upper respiratory specimens during the acute phase of infection. Positive results are indicative of the presence of the identified virus, but do not rule out bacterial infection or co-infection with other pathogens not detected by the test. Clinical  correlation with patient history and other diagnostic information is necessary to determine patient infection status. The expected result is Negative.  Fact Sheet for Patients: BloggerCourse.com  Fact Sheet for Healthcare Providers: SeriousBroker.it  This test is not yet approved or cleared by the [place] FDA and  has been authorized for detection and/or diagnosis of SARS-CoV-2 by FDA under an Emergency Use Authorization (EUA).  This EUA will remain in effect (meaning this  test can be used) for the duration of  the COVID-19 declaration under Section 564(b)(1) of the Act, 21 U.S.C.  section 360bbb-3(b)(1), unless the authorization is terminated or revoked sooner.     Influenza A by PCR NEGATIVE NEGATIVE Final   Influenza B by PCR NEGATIVE NEGATIVE Final    Comment: (NOTE) The Xpert Xpress SARS-CoV-2/FLU/RSV plus assay is intended as an aid in the diagnosis of influenza from Nasopharyngeal swab specimens and should not be used as a sole basis for treatment. Nasal washings and aspirates are unacceptable for Xpert Xpress SARS-CoV-2/FLU/RSV testing.  Fact Sheet for Patients: BloggerCourse.com  Fact Sheet for Healthcare Providers: SeriousBroker.it  This test is not yet approved or cleared by the [place] FDA and has been authorized for detection and/or diagnosis of SARS-CoV-2 by FDA under an Emergency Use Authorization (EUA). This EUA will remain in effect (meaning this test can be used) for the duration of the COVID-19 declaration under Section 564(b)(1) of the Act, 21 U.S.C. section 360bbb-3(b)(1), unless the authorization is terminated or revoked.  Performed at [facility], [address] [place], [place] [address]      Medications:    amiodarone  200 mg Oral Daily   docusate sodium  100 mg Oral Daily   furosemide  20 mg Oral Daily   hydrALAZINE   50 mg Oral BID   insulin aspart  0-6 Units Subcutaneous TID WC   losartan  100 mg Oral Daily   memantine  10 mg Oral BID   metoprolol succinate  50 mg Oral Daily   potassium chloride  10 mEq Oral Daily   rivaroxaban  20 mg Oral Q supper   tamsulosin  0.4 mg Oral Daily   Continuous Infusions:  remdesivir 100 mg in NS 100 mL        LOS: 0 days   Marinda Elk  [facility]  03/26/2021, 7:41 AM

## 2021-03-26 NOTE — Evaluation (Signed)
Physical Therapy Evaluation Patient Details Name: Jose Patel MRN: [id number] DOB: Apr 03, 1934 Today's Date: 03/26/2021   History of Present Illness  87 y/o male presented to ED on 8/25 for weakness, cough and fall. Tested positive for COVID 19. PMH: HTN, CHF, DM type 2, Afib, hx of sinus node dysfunction s/p pacemaker placement, aortic insufficiency, sleep apnea, hx of stroke  Clinical Impression  PTA, patient recently began living alone after wife died on 03-31-2023. Patient reports independence without AD, however per chart review, patient utilizes RW for ambulation. Patient has PCA 3x/week for 2 hours/day. Patient presents with generalized weakness, impaired balance, decreased activity tolerance, and impaired cognition. Patient required minA for bed mobility and min guard for transfers and ambulation with HHAx1. Patient will benefit from skilled PT services during acute stay to address listed deficits. Recommend SNF at discharge unless family can provide 24/7 supervision for safety at discharge. If family is able to provide necessary supervision, patient can go home with HHPT.     Follow Up Recommendations SNF;Supervision/Assistance - 24 hour    Equipment Recommendations  Rolling Katheline Brendlinger with 5" wheels    Recommendations for Other Services       Precautions / Restrictions Precautions Precautions: Fall Precaution Comments: hx of falls Restrictions Weight Bearing Restrictions: No      Mobility  Bed Mobility Overal bed mobility: Needs Assistance Bed Mobility: Supine to Sit     Supine to sit: Min assist     General bed mobility comments: minA for trunk elevation    Transfers Overall transfer level: Needs assistance Equipment used: None Transfers: Sit to/from Stand Sit to Stand: Min guard         General transfer comment: min guard for safety, no physical assistance required  Ambulation/Gait Ambulation/Gait assistance: Min guard Gait Distance (Feet): 25 Feet Assistive  device: 1 person hand held assist Gait Pattern/deviations: Step-through pattern;Decreased stride length;Shuffle;Narrow base of support Gait velocity: decreased   General Gait Details: min guard for safety. Patient unsteady and requiring HHA for support. Distance limited due to COVID precautions  Stairs            Wheelchair Mobility    Modified Rankin (Stroke Patients Only)       Balance Overall balance assessment: Mild deficits observed, not formally tested                                           Pertinent Vitals/Pain Pain Assessment: No/denies pain    Home Living Family/patient expects to be discharged to:: Private residence Living Arrangements: Alone Available Help at Discharge: Family;Available PRN/intermittently Type of Home: House Home Access: Level entry     Home Layout: One level Home Equipment: Palma Buster - 2 wheels      Prior Function Level of Independence: Independent with assistive device(s)         Comments: per patient, he does not use RW. Per chart review, patient uses RW at baseline. Patient states he has PCA 3x/week for 2 hours/day for cooking and cleaning     Hand Dominance        Extremity/Trunk Assessment   Upper Extremity Assessment Upper Extremity Assessment: Generalized weakness    Lower Extremity Assessment Lower Extremity Assessment: Generalized weakness    Cervical / Trunk Assessment Cervical / Trunk Assessment: Kyphotic  Communication   Communication: No difficulties  Cognition Arousal/Alertness: Awake/alert Behavior During Therapy: Flat affect Overall Cognitive  Status: No family/caregiver present to determine baseline cognitive functioning                                 General Comments: depressed mood. Stating "I don't have anything to live for. My wife passed away on the 2023/06/21 and she was my life." Notified Charity fundraiser.      General Comments      Exercises     Assessment/Plan    PT  Assessment Patient needs continued PT services  PT Problem List Decreased strength;Decreased activity tolerance;Decreased balance;Decreased mobility;Decreased cognition;Decreased safety awareness       PT Treatment Interventions DME instruction;Gait training;Functional mobility training;Therapeutic activities;Therapeutic exercise;Balance training;Patient/family education    PT Goals (Current goals can be found in the Care Plan section)  Acute Rehab PT Goals Patient Stated Goal: to go home PT Goal Formulation: With patient Time For Goal Achievement: 04/09/21 Potential to Achieve Goals: Good    Frequency Min 3X/week   Barriers to discharge Decreased caregiver support      Co-evaluation               AM-PAC PT "6 Clicks" Mobility  Outcome Measure Help needed turning from your back to your side while in a flat bed without using bedrails?: A Little Help needed moving from lying on your back to sitting on the side of a flat bed without using bedrails?: A Little Help needed moving to and from a bed to a chair (including a wheelchair)?: A Little Help needed standing up from a chair using your arms (e.g., wheelchair or bedside chair)?: A Little Help needed to walk in hospital room?: A Little Help needed climbing 3-5 steps with a railing? : A Little 6 Click Score: 18    End of Session Equipment Utilized During Treatment: Gait belt Activity Tolerance: Patient tolerated treatment well Patient left: in chair;with call bell/phone within reach Nurse Communication: Mobility status PT Visit Diagnosis: Unsteadiness on feet (R26.81);Muscle weakness (generalized) (M62.81);Repeated falls (R29.6);Difficulty in walking, not elsewhere classified (R26.2)    Time: 3664-4034 PT Time Calculation (min) (ACUTE ONLY): 33 min   Charges:   PT Evaluation $PT Eval Moderate Complexity: 1 Mod PT Treatments $Therapeutic Activity: 8-22 mins        Justa Hatchell A. Dan Humphreys PT, DPT Acute Rehabilitation  Services Pager [phone] Office [phone]   Viviann Spare 03/26/2021, 5:01 PM

## 2021-03-27 ENCOUNTER — Encounter: Payer: Self-pay | Admitting: Nurse Practitioner

## 2021-03-27 DIAGNOSIS — N400 Enlarged prostate without lower urinary tract symptoms: Secondary | ICD-10-CM | POA: Diagnosis present

## 2021-03-27 DIAGNOSIS — G4733 Obstructive sleep apnea (adult) (pediatric): Secondary | ICD-10-CM | POA: Diagnosis present

## 2021-03-27 DIAGNOSIS — R531 Weakness: Secondary | ICD-10-CM | POA: Diagnosis present

## 2021-03-27 DIAGNOSIS — R778 Other specified abnormalities of plasma proteins: Secondary | ICD-10-CM | POA: Diagnosis not present

## 2021-03-27 DIAGNOSIS — Z85818 Personal history of malignant neoplasm of other sites of lip, oral cavity, and pharynx: Secondary | ICD-10-CM | POA: Diagnosis not present

## 2021-03-27 DIAGNOSIS — I248 Other forms of acute ischemic heart disease: Secondary | ICD-10-CM | POA: Diagnosis not present

## 2021-03-27 DIAGNOSIS — I480 Paroxysmal atrial fibrillation: Secondary | ICD-10-CM | POA: Diagnosis present

## 2021-03-27 DIAGNOSIS — Z8249 Family history of ischemic heart disease and other diseases of the circulatory system: Secondary | ICD-10-CM | POA: Diagnosis not present

## 2021-03-27 DIAGNOSIS — U071 COVID-19: Secondary | ICD-10-CM | POA: Diagnosis not present

## 2021-03-27 DIAGNOSIS — I10 Essential (primary) hypertension: Secondary | ICD-10-CM | POA: Diagnosis not present

## 2021-03-27 DIAGNOSIS — I495 Sick sinus syndrome: Secondary | ICD-10-CM | POA: Diagnosis not present

## 2021-03-27 DIAGNOSIS — I080 Rheumatic disorders of both mitral and aortic valves: Secondary | ICD-10-CM | POA: Diagnosis not present

## 2021-03-27 DIAGNOSIS — I5043 Acute on chronic combined systolic (congestive) and diastolic (congestive) heart failure: Secondary | ICD-10-CM | POA: Diagnosis not present

## 2021-03-27 DIAGNOSIS — I5033 Acute on chronic diastolic (congestive) heart failure: Secondary | ICD-10-CM | POA: Diagnosis not present

## 2021-03-27 DIAGNOSIS — E1140 Type 2 diabetes mellitus with diabetic neuropathy, unspecified: Secondary | ICD-10-CM | POA: Diagnosis not present

## 2021-03-27 DIAGNOSIS — Z7901 Long term (current) use of anticoagulants: Secondary | ICD-10-CM | POA: Diagnosis not present

## 2021-03-27 DIAGNOSIS — Z8673 Personal history of transient ischemic attack (TIA), and cerebral infarction without residual deficits: Secondary | ICD-10-CM | POA: Diagnosis not present

## 2021-03-27 DIAGNOSIS — R4189 Other symptoms and signs involving cognitive functions and awareness: Secondary | ICD-10-CM | POA: Diagnosis present

## 2021-03-27 DIAGNOSIS — K219 Gastro-esophageal reflux disease without esophagitis: Secondary | ICD-10-CM | POA: Diagnosis present

## 2021-03-27 DIAGNOSIS — Z8349 Family history of other endocrine, nutritional and metabolic diseases: Secondary | ICD-10-CM | POA: Diagnosis not present

## 2021-03-27 DIAGNOSIS — Z950 Presence of cardiac pacemaker: Secondary | ICD-10-CM | POA: Diagnosis not present

## 2021-03-27 DIAGNOSIS — E119 Type 2 diabetes mellitus without complications: Secondary | ICD-10-CM | POA: Diagnosis not present

## 2021-03-27 DIAGNOSIS — Z79899 Other long term (current) drug therapy: Secondary | ICD-10-CM | POA: Diagnosis not present

## 2021-03-27 DIAGNOSIS — I110 Hypertensive heart disease with heart failure: Secondary | ICD-10-CM | POA: Diagnosis not present

## 2021-03-27 DIAGNOSIS — R296 Repeated falls: Secondary | ICD-10-CM | POA: Diagnosis present

## 2021-03-27 DIAGNOSIS — I483 Typical atrial flutter: Secondary | ICD-10-CM | POA: Diagnosis not present

## 2021-03-27 DIAGNOSIS — I2510 Atherosclerotic heart disease of native coronary artery without angina pectoris: Secondary | ICD-10-CM | POA: Diagnosis not present

## 2021-03-27 DIAGNOSIS — I252 Old myocardial infarction: Secondary | ICD-10-CM | POA: Diagnosis not present

## 2021-03-27 LAB — CBC WITH DIFFERENTIAL/PLATELET
Abs Immature Granulocytes: 0.02 10*3/uL (ref 0.00–0.07)
Basophils Absolute: 0 10*3/uL (ref 0.0–0.1)
Basophils Relative: 0 %
Eosinophils Absolute: 0.1 10*3/uL (ref 0.0–0.5)
Eosinophils Relative: 2 %
HCT: 41.6 % (ref 39.0–52.0)
Hemoglobin: 13.7 g/dL (ref 13.0–17.0)
Immature Granulocytes: 0 %
Lymphocytes Relative: 24 %
Lymphs Abs: 1.5 10*3/uL (ref 0.7–4.0)
MCH: 30.9 pg (ref 26.0–34.0)
MCHC: 32.9 g/dL (ref 30.0–36.0)
MCV: 93.9 fL (ref 80.0–100.0)
Monocytes Absolute: 0.7 10*3/uL (ref 0.1–1.0)
Monocytes Relative: 11 %
Neutro Abs: 4.1 10*3/uL (ref 1.7–7.7)
Neutrophils Relative %: 63 %
Platelets: 207 10*3/uL (ref 150–400)
RBC: 4.43 MIL/uL (ref 4.22–5.81)
RDW: 14.3 % (ref 11.5–15.5)
WBC: 6.5 10*3/uL (ref 4.0–10.5)
nRBC: 0 % (ref 0.0–0.2)

## 2021-03-27 LAB — GLUCOSE, CAPILLARY
Glucose-Capillary: 97 mg/dL (ref 70–99)
Glucose-Capillary: 119 mg/dL — ABNORMAL HIGH (ref 70–99)
Glucose-Capillary: 123 mg/dL — ABNORMAL HIGH (ref 70–99)
Glucose-Capillary: 103 mg/dL — ABNORMAL HIGH (ref 70–99)

## 2021-03-27 LAB — COMPREHENSIVE METABOLIC PANEL
ALT: 344 U/L — ABNORMAL HIGH (ref 0–44)
AST: 231 U/L — ABNORMAL HIGH (ref 15–41)
Albumin: 2.7 g/dL — ABNORMAL LOW (ref 3.5–5.0)
Alkaline Phosphatase: 80 U/L (ref 38–126)
Anion gap: 6 (ref 5–15)
BUN: 38 mg/dL — ABNORMAL HIGH (ref 8–23)
CO2: 26 mmol/L (ref 22–32)
Calcium: 8.3 mg/dL — ABNORMAL LOW (ref 8.9–10.3)
Chloride: 106 mmol/L (ref 98–111)
Creatinine, Ser: 1.23 mg/dL (ref 0.61–1.24)
GFR, Estimated: 58 mL/min — ABNORMAL LOW (ref >60)
Glucose, Bld: 101 mg/dL — ABNORMAL HIGH (ref 70–99)
Potassium: 4.6 mmol/L (ref 3.5–5.1)
Sodium: 138 mmol/L (ref 135–145)
Total Bilirubin: 0.8 mg/dL (ref 0.3–1.2)
Total Protein: 5.7 g/dL — ABNORMAL LOW (ref 6.5–8.1)

## 2021-03-27 LAB — D-DIMER, QUANTITATIVE: D-Dimer, Quant: <0.27 ug/mL-FEU (ref 0.00–0.50)

## 2021-03-27 LAB — C-REACTIVE PROTEIN: CRP: 5.4 mg/dL — ABNORMAL HIGH (ref <1.0)

## 2021-03-27 NOTE — Evaluation (Signed)
Occupational Therapy Evaluation Patient Details Name: Jose Patel MRN: [id number] DOB: Aug 03, 1933 Today's Date: 03/27/2021    History of Present Illness 87 y/o male presented to ED on 8/25 for weakness, cough and fall. Tested positive for COVID 19. PMH: HTN, CHF, DM type 2, Afib, hx of sinus node dysfunction s/p pacemaker placement, aortic insufficiency, sleep apnea, hx of stroke   Clinical Impression   Pt PTA: pt lives alone with family nearby. Pt reports independence with OOB ADL. Pt currently, Pt limited by decreased activity tolerance, decreased ability to care for self, and decreased strength. Pt requires increased assist due to lack of motivation, but when set-up for exisiting task, pt would perform it.  Pt O2 > 90% on RA. Pt minA for ADL and minguardA to minA for mobility. Pt would benefit from continued OT skilled services. OT following acutely.    Follow Up Recommendations  SNF;Supervision/Assistance - 24 hour (If pt does not agree to SNF or family can assist with 24/7, HHOT is appropriate.)    Equipment Recommendations  None recommended by OT    Recommendations for Other Services       Precautions / Restrictions Precautions Precautions: Fall Precaution Comments: hx of falls; +COVID Restrictions Weight Bearing Restrictions: No      Mobility Bed Mobility Overal bed mobility: Needs Assistance Bed Mobility: Supine to Sit     Supine to sit: Min assist     General bed mobility comments: minA for trunk elevation and cues to scoot to EOB: pt kep leaning to L as if he were going back into bed before scooting to have feet on floor    Transfers Overall transfer level: Needs assistance Equipment used: None Transfers: Sit to/from Stand Sit to Stand: Min guard         General transfer comment: min guard for safety, no physical assistance required    Balance Overall balance assessment: Mild deficits observed, not formally tested                                          ADL either performed or assessed with clinical judgement   ADL Overall ADL's : Needs assistance/impaired Eating/Feeding: Supervision/ safety   Grooming: Supervision/safety;Sitting;Standing   Upper Body Bathing: Minimal assistance;Sitting   Lower Body Bathing: Minimal assistance;Sitting/lateral leans;Sit to/from stand;Cueing for safety   Upper Body Dressing : Minimal assistance;Sitting   Lower Body Dressing: Minimal assistance;Sitting/lateral leans;Sit to/from stand;Cueing for safety   Toilet Transfer: Min guard;Cueing for safety;RW;Ambulation Toilet Transfer Details (indicate cue type and reason): cues for hand placement Toileting- Clothing Manipulation and Hygiene: Minimal assistance;Sitting/lateral lean;Sit to/from stand       Functional mobility during ADLs: Minimal assistance;Rolling walker;Cueing for safety General ADL Comments: Pt limited by decreased activity tolerance, decreased ability to care for self, and decreased strength. Pt requires increased assist due to lack of motivation, but when set-up for exisiting task, pt would perfrom it.     Vision Baseline Vision/History: 1 Wears glasses Ability to See in Adequate Light: 0 Adequate Patient Visual Report: No change from baseline Vision Assessment?: No apparent visual deficits     Perception     Praxis      Pertinent Vitals/Pain Pain Assessment: No/denies pain     Hand Dominance Right   Extremity/Trunk Assessment Upper Extremity Assessment Upper Extremity Assessment: Generalized weakness   Lower Extremity Assessment Lower Extremity Assessment: Generalized weakness   Cervical /  Trunk Assessment Cervical / Trunk Assessment: Kyphotic   Communication Communication Communication: No difficulties   Cognition Arousal/Alertness: Awake/alert Behavior During Therapy: Flat affect Overall Cognitive Status: No family/caregiver present to determine baseline cognitive functioning                                  General Comments: pt alert and seemed slightly flat, but did not express feelings of sadness. Enjoyed talking about children and grandchildren   General Comments  Pt on RA; VSS.    Exercises     Shoulder Instructions      Home Living Family/patient expects to be discharged to:: Private residence Living Arrangements: Alone Available Help at Discharge: Family;Available PRN/intermittently Type of Home: House Home Access: Level entry     Home Layout: One level     Bathroom Shower/Tub: Producer, television/film/video: Standard     Home Equipment: Walker - 2 wheels          Prior Functioning/Environment Level of Independence: Independent with assistive device(s)        Comments: per patient, he does not use RW. Per chart review, patient uses RW at baseline. Patient states he has PCA 3x/week for 2 hours/day for cooking and cleaning        OT Problem List: Decreased activity tolerance;Decreased strength;Impaired balance (sitting and/or standing);Decreased knowledge of use of DME or AE;Cardiopulmonary status limiting activity;Decreased safety awareness      OT Treatment/Interventions: Self-care/ADL training;Energy conservation;DME and/or AE instruction;Therapeutic activities;Patient/family education;Balance training;Cognitive remediation/compensation    OT Goals(Current goals can be found in the care plan section) Acute Rehab OT Goals Patient Stated Goal: to go home OT Goal Formulation: With patient Time For Goal Achievement: 04/10/21 Potential to Achieve Goals: Good ADL Goals Additional ADL Goal #1: Pt will perform x10 mins of OOB ADL routine with 1 seated rest break <1 minute. Additional ADL Goal #2: Pt will state 3 energy conservation strategies in order to increase activity tolerance.  OT Frequency: Min 2X/week   Barriers to D/C:            Co-evaluation              AM-PAC OT "6 Clicks" Daily Activity     Outcome  Measure Help from another person eating meals?: None Help from another person taking care of personal grooming?: None Help from another person toileting, which includes using toliet, bedpan, or urinal?: A Little Help from another person bathing (including washing, rinsing, drying)?: A Little Help from another person to put on and taking off regular upper body clothing?: None Help from another person to put on and taking off regular lower body clothing?: A Little 6 Click Score: 21   End of Session Equipment Utilized During Treatment: Rolling walker Nurse Communication: Mobility status;Other (comment) (pt not  on chair alarm, but stating "I can't get up without help." RN aware.)  Activity Tolerance: Patient limited by fatigue Patient left: in chair;with call bell/phone within reach  OT Visit Diagnosis: Unsteadiness on feet (R26.81);Muscle weakness (generalized) (M62.81)                Time: 9562-1308 OT Time Calculation (min): 22 min Charges:  OT General Charges $OT Visit: 1 Visit OT Evaluation $OT Eval Moderate Complexity: 1 Mod  Flora Lipps, OTR/L Acute Rehabilitation Services Pager: [phone] Office: [phone]  Ottilie Wigglesworth C 03/27/2021, 2:18 PM

## 2021-03-27 NOTE — Progress Notes (Signed)
[facility] PROGRESS NOTE    Progress Note  Jose Patel  ION:[id number] DOB: Nov 28, 1933 DOA: 03/24/2021 PCP: [name], NP     Brief Narrative:   Jose Patel is an 87 y.o. male past medical history of chronic combined systolic and diastolic heart failure with an EF of 40% in December 2021 history of sinus node dysfunction status post pacemaker, atrial fibrillation status post ablation on amiodarone aortic insufficiency sleep apnea who had recently an increase in his Lasix due to worsening lower extremity edema came into the ER because of weakness, cough and a fall, imaging was unremarkable he denies any fever.  Assessment/Plan:   Generalized weakness and fall secondary to COVID-19 virus infection: Continue IV remdesivir satting greater 94% on room air. Physical therapy evaluated the patient and the recommended skilled nursing facility. Will get TOC involved.  Acute on chronic systolic and diastolic heart failure: Appears in euvolemic on physical exam. Resume home dose of Lasix. Continue strict I's and O's and daily weights monitor electrolytes and replete as needed.  Essential hypertension: Continue hydralazine Cozaar metoprolol.  Diabetes mellitus type 2: Presently on no medications A1c 5.7.  Cognitive decline: Continue Namenda.  Atrial fibrillation: Continue amiodarone and Xarelto.  Elevated troponins: Likely demand ischemia denies any chest pain or shortness of breath.  Goals of care: Speaking to the daughter of the patient relates and the daughter confirms that he has had a good life and when it is time for him to go he wanted to be that way. At this point in time I will consult palliative care to address goals of care and follow him up as an outpatient. It is to note that his wife of 22 years of marriage passed away on 03-09-2023.  DVT prophylaxis: Xarelto Family Communication:Daughter Status is: Observation  The patient will require care  spanning > 2 midnights and should be moved to inpatient because: Hemodynamically unstable  Dispo: The patient is from: Home              Anticipated d/c is to: SNF              Patient currently is not medically stable to d/c.   Difficult to place patient No   Code Status:     Code Status Orders  (From admission, onward)           Start     Ordered   03/25/21 0400  Full code  Continuous        03/25/21 0400           Code Status History     Date Active Date Inactive Code Status Order ID Comments User Context   07/07/2019 1641 07/08/2019 1412 Full Code [id number]  Thurmon Fair, MD Inpatient   05/14/2019 1532 05/16/2019 2115 DNR [id number]  Thurmon Fair, MD ED   05/14/2019 0124 05/14/2019 1532 Full Code [id number]  Lonie Peak, MD ED      Advance Directive Documentation    Flowsheet Row Most Recent Value  Type of Advance Directive Healthcare Power of Attorney, Living will  Pre-existing out of facility DNR order (yellow form or pink MOST form) --  "MOST" Form in Place? --         IV Access:   Peripheral IV   Procedures and diagnostic studies:   No results found.   Medical Consultants:   None.   Subjective:    Jose Patel relates he continues to improve feels better than yesterday.  Objective:  Vitals:   03/26/21 2134 03/27/21 0005 03/27/21 0617 03/27/21 0809  BP: (!) 131/50 (!) 148/75 (!) 131/58 (!) 146/69  Pulse: 60 60 (!) 59 63  Resp: (!) 24 18 17    Temp: 98.5 F (36.9 C) 97.8 F (36.6 C) 98.3 F (36.8 C) 97.8 F (36.6 C)  TempSrc: Oral Oral Oral Oral  SpO2: 98% 94% 96% 98%  Weight:   73.6 kg   Height:       SpO2: 98 %   Intake/Output Summary (Last 24 hours) at 03/27/2021 0810 Last data filed at 03/27/2021 0000 Gross per 24 hour  Intake 580 ml  Output 850 ml  Net -270 ml    Filed Weights   03/25/21 0306 03/26/21 0246 03/27/21 0617  Weight: 76.4 kg 73.3 kg 73.6 kg    Exam: General exam: In no acute  distress. Respiratory system: Good air movement and clear to auscultation. Cardiovascular system: S1 & S2 heard, RRR. No JVD. Gastrointestinal system: Abdomen is nondistended, soft and nontender.  Extremities: No pedal edema. Skin: No rashes, lesions or ulcers   Data Reviewed:    Labs: Basic Metabolic Panel: Recent Labs  Lab 03/24/21 1952 03/26/21 0403 03/27/21 0436  NA 137 138 138  K 4.3 3.3* 4.6  CL 100 104 106  CO2 25 26 26   GLUCOSE 151* 110* 101*  BUN 27* 32* 38*  CREATININE 1.24 1.30* 1.23  CALCIUM 8.9 8.5* 8.3*    GFR Estimated Creatinine Clearance: 43.9 mL/min (by C-G formula based on SCr of 1.23 mg/dL). Liver Function Tests: Recent Labs  Lab 03/26/21 0403 03/27/21 0436  AST 154* 231*  ALT 186* 344*  ALKPHOS 88 80  BILITOT 1.0 0.8  PROT 5.9* 5.7*  ALBUMIN 2.8* 2.7*    No results for input(s): LIPASE, AMYLASE in the last 168 hours. No results for input(s): AMMONIA in the last 168 hours. Coagulation profile No results for input(s): INR, PROTIME in the last 168 hours. COVID-19 Labs  Recent Labs    03/26/21 0403 03/27/21 0436  DDIMER 0.42 <0.27  CRP 4.7* 5.4*     Lab Results  Component Value Date   SARSCOV2NAA POSITIVE (A) 03/24/2021   SARSCOV2NAA Detected (A) 04/09/2020   SARSCOV2NAA NEGATIVE 07/04/2019   SARSCOV2NAA NEGATIVE 05/14/2019    CBC: Recent Labs  Lab 03/24/21 1952 03/26/21 0403 03/27/21 0436  WBC 10.3 6.9 6.5  NEUTROABS  --  5.0 4.1  HGB 15.0 13.8 13.7  HCT 45.3 40.3 41.6  MCV 95.2 93.3 93.9  PLT 259 228 207    Cardiac Enzymes: No results for input(s): CKTOTAL, CKMB, CKMBINDEX, TROPONINI in the last 168 hours. BNP (last 3 results) No results for input(s): PROBNP in the last 8760 hours. CBG: Recent Labs  Lab 03/25/21 0710 03/25/21 1145 03/25/21 1646 03/25/21 2038 03/27/21 0614  GLUCAP 125* 138* 123* 122* 97    D-Dimer: Recent Labs    03/26/21 0403 03/27/21 0436  DDIMER 0.42 <0.27    Hgb A1c: No  results for input(s): HGBA1C in the last 72 hours. Lipid Profile: No results for input(s): CHOL, HDL, LDLCALC, TRIG, CHOLHDL, LDLDIRECT in the last 72 hours. Thyroid function studies: Recent Labs    03/25/21 0409  TSH 1.852    Anemia work up: No results for input(s): VITAMINB12, FOLATE, FERRITIN, TIBC, IRON, RETICCTPCT in the last 72 hours. Sepsis Labs: Recent Labs  Lab 03/24/21 1952 03/26/21 0403 03/27/21 0436  WBC 10.3 6.9 6.5    Microbiology Recent Results (from the past 240 hour(s))  Resp Panel by RT-PCR (Flu A&B, Covid) Nasopharyngeal Swab     Status: Abnormal   Collection Time: 03/24/21 10:31 PM   Specimen: Nasopharyngeal Swab; Nasopharyngeal(NP) swabs in vial transport medium  Result Value Ref Range Status   SARS Coronavirus 2 by RT PCR POSITIVE (A) NEGATIVE Final    Comment: RESULT CALLED TO, READ BACK BY AND VERIFIED WITH: KELLIE NEAL RN AT 2323 ON 03/24/21 BY I.[email or web address] (NOTE) SARS-CoV-2 target nucleic acids are DETECTED.  The SARS-CoV-2 RNA is generally detectable in upper respiratory specimens during the acute phase of infection. Positive results are indicative of the presence of the identified virus, but do not rule out bacterial infection or co-infection with other pathogens not detected by the test. Clinical correlation with patient history and other diagnostic information is necessary to determine patient infection status. The expected result is Negative.  Fact Sheet for Patients: BloggerCourse.com  Fact Sheet for Healthcare Providers: SeriousBroker.it  This test is not yet approved or cleared by the [place] FDA and  has been authorized for detection and/or diagnosis of SARS-CoV-2 by FDA under an Emergency Use Authorization (EUA).  This EUA will remain in effect (meaning this  test can be used) for the duration of  the COVID-19 declaration under Section 564(b)(1) of the Act, 21 U.S.C. section  360bbb-3(b)(1), unless the authorization is terminated or revoked sooner.     Influenza A by PCR NEGATIVE NEGATIVE Final   Influenza B by PCR NEGATIVE NEGATIVE Final    Comment: (NOTE) The Xpert Xpress SARS-CoV-2/FLU/RSV plus assay is intended as an aid in the diagnosis of influenza from Nasopharyngeal swab specimens and should not be used as a sole basis for treatment. Nasal washings and aspirates are unacceptable for Xpert Xpress SARS-CoV-2/FLU/RSV testing.  Fact Sheet for Patients: BloggerCourse.com  Fact Sheet for Healthcare Providers: SeriousBroker.it  This test is not yet approved or cleared by the [place] FDA and has been authorized for detection and/or diagnosis of SARS-CoV-2 by FDA under an Emergency Use Authorization (EUA). This EUA will remain in effect (meaning this test can be used) for the duration of the COVID-19 declaration under Section 564(b)(1) of the Act, 21 U.S.C. section 360bbb-3(b)(1), unless the authorization is terminated or revoked.  Performed at [facility], [address] [place], [place] [address]      Medications:    amiodarone  200 mg Oral Daily   docusate sodium  100 mg Oral Daily   furosemide  20 mg Oral Daily   hydrALAZINE  50 mg Oral BID   insulin aspart  0-6 Units Subcutaneous TID WC   losartan  100 mg Oral Daily   memantine  10 mg Oral BID   metoprolol succinate  50 mg Oral Daily   polyethylene glycol  17 g Oral BID   potassium chloride  10 mEq Oral Daily   rivaroxaban  15 mg Oral Q supper   tamsulosin  0.4 mg Oral Daily   Continuous Infusions:  remdesivir 100 mg in NS 100 mL 100 mg (03/26/21 1230)      LOS: 0 days   Marinda Elk  [facility]  03/27/2021, 8:10 AM

## 2021-03-27 NOTE — Plan of Care (Signed)
  Problem: Skin Integrity: Goal: Risk for impaired skin integrity will decrease Outcome: Completed/Met   Problem: Pain Managment: Goal: General experience of comfort will improve Outcome: Completed/Met   Problem: Elimination: Goal: Will not experience complications related to urinary retention Outcome: Completed/Met   

## 2021-03-27 NOTE — TOC Initial Note (Addendum)
Transition of Care [facility]) - Initial/Assessment Note    Patient Details  Name: Jose Patel MRN: [id number] Date of Birth: Feb 12, 1934  [facility]) CM/SW Contact:    Patrice Paradise, LCSW Phone Number: [phone] 03/27/2021, 4:26 PM  Clinical Narrative:      CSW initially spoke with pt daughter [name] outside the room in regards to DC planning. [name] was aware of the recommendation of a SNF and was in agreement. CSW guided [name] through the Medicare.gov website for her to access the rating. She explained to [name] that fewer facilities accept [employer]. [name] was agreeable to sending referrals locally to contracted [employer] facilities as well near pt's home in [place]. She is requesting that referral is sent to [facility] and [address]. Pt has been vaccinated and had his last booster on 03/03/2021. [name] explained that pt has not been in a facility before.  CSW spoke with pt via phone and discuss the recommendation and if he was agreeable to SNF. He asked to contact his daughter for her to make the decision via phone.        TOC team will continue to assist with discharge planning needs.   Expected Discharge Plan: Skilled Nursing Facility Barriers to Discharge: SNF Covid, Continued Medical Work up, SNF Pending bed offer   Patient Goals and CMS Choice   CMS Medicare.gov Compare Post Acute Care list provided to:: Patient Represenative (must comment) (Daughter website) Choice offered to / list presented to : Adult Children  Expected Discharge Plan and Services Expected Discharge Plan: Skilled Nursing Facility       Living arrangements for the past 2 months: Single Family Home                                      Prior Living Arrangements/Services Living arrangements for the past 2 months: Single Family Home Lives with:: Self, Adult Children                   Activities of Daily Living Home Assistive Devices/Equipment: Cane (specify quad or  straight), Walker (specify type) ADL Screening (condition at time of admission) Patient's cognitive ability adequate to safely complete daily activities?: Yes Is the patient deaf or have difficulty hearing?: No Does the patient have difficulty seeing, even when wearing glasses/contacts?: No Does the patient have difficulty concentrating, remembering, or making decisions?: No Patient able to express need for assistance with ADLs?: Yes Does the patient have difficulty dressing or bathing?: No Independently performs ADLs?: Yes (appropriate for developmental age) Does the patient have difficulty walking or climbing stairs?: No Weakness of Legs: Both  Permission Sought/Granted   Permission granted to share information with : Yes, Verbal Permission Granted  Share Information with NAME: [name]  Permission granted to share info w AGENCY: SNFs  Permission granted to share info w Relationship: Daughter  Permission granted to share info w Contact Information: [phone]  Emotional Assessment Appearance:: Other (Comment Required Attitude/Demeanor/Rapport: Unable to Assess Affect (typically observed): Unable to Assess Orientation: : Oriented to Self, Oriented to Place, Oriented to  Time, Oriented to Situation      Admission diagnosis:  Elevated troponin [R77.8] Acute on chronic diastolic congestive heart failure (HCC) [I50.33] Acute on chronic combined systolic and diastolic CHF (congestive heart failure) (HCC) [I50.43] COVID-19 virus infection [U07.1] Weakness [R53.1] Patient Active Problem List   Diagnosis Date Noted   Elevated  troponin 03/25/2021   Weakness 03/25/2021   Acute on chronic combined systolic and diastolic CHF (congestive heart failure) (HCC) 03/24/2021   COVID-19 virus infection 03/24/2021   Atypical atrial flutter (HCC) 11/07/2019   Secondary hypercoagulable state (HCC) 11/07/2019   Pacemaker 07/07/2019   Tachycardia-bradycardia syndrome (HCC)    Paroxysmal atrial  flutter (HCC)    Paroxysmal A-fib (HCC) 05/14/2019   Dyslipidemia (high LDL; low HDL) 03/26/2019   SSS (sick sinus syndrome) (HCC) 03/26/2019   Fatigue 01/16/2019   Persistent atrial fibrillation (HCC)    Vitamin D deficiency 11/20/2018   Aortic valve regurgitation 07/07/2018   Sinus bradycardia 07/07/2018   Long term (current) use of anticoagulants 07/07/2018   Diastolic dysfunction 07/07/2018   Hypercholesteremia 07/07/2018   Mild dilation of ascending aorta (HCC) 07/07/2018   OSA (obstructive sleep apnea)    Essential hypertension    Diabetes mellitus, type 2 (HCC)    Coronary artery disease    Paroxysmal atrial fibrillation (HCC)    CHF (congestive heart failure) (HCC)    PCP:  [name], NP Pharmacy:   [facility] [address], [place] - [address] [place] [address] Phone: [phone] Fax: [phone]  [id number] - [place], [place] - [address] [place] [address] Phone: [phone] Fax: [phone]     Social Determinants of Health (SDOH) Interventions    Readmission Risk Interventions Readmission Risk Prevention Plan 05/16/2019  Transportation Screening Complete  PCP or Specialist Appt within 5-7 Days Complete  Home Care Screening Complete  Medication Review (RN CM) Complete  Some recent data might be hidden

## 2021-03-28 LAB — GLUCOSE, CAPILLARY
Glucose-Capillary: 111 mg/dL — ABNORMAL HIGH (ref 70–99)
Glucose-Capillary: 119 mg/dL — ABNORMAL HIGH (ref 70–99)
Glucose-Capillary: 96 mg/dL (ref 70–99)

## 2021-03-28 NOTE — Discharge Instructions (Addendum)

## 2021-03-28 NOTE — Progress Notes (Signed)
Physical Therapy Treatment Patient Details Name: Jose Patel MRN: [id number] DOB: November 16, 1933 Today's Date: 03/28/2021    History of Present Illness 87 y/o male presented to ED on 8/25 for weakness, cough and fall. Tested positive for COVID 19. PMH: HTN, CHF, DM type 2, Afib, hx of sinus node dysfunction s/p pacemaker placement, aortic insufficiency, sleep apnea, hx of stroke    PT Comments    Pt received in bed, eager and receptive to participation in therapy. He required min assist bed mobility, min guard assist transfers, and min guard assist ambulation 68' with RW. Gait limited to in room due to covid precautions. LE exercises performed in recliner. Pt in recliner with feet elevated at end of session.    Follow Up Recommendations  SNF;Supervision/Assistance - 24 hour     Equipment Recommendations  Rolling walker with 5" wheels    Recommendations for Other Services       Precautions / Restrictions Precautions Precautions: Fall;Other (comment) Precaution Comments: hx of falls; +COVID    Mobility  Bed Mobility Overal bed mobility: Needs Assistance Bed Mobility: Supine to Sit     Supine to sit: Min assist     General bed mobility comments: +rail, assist to elevate trunk    Transfers Overall transfer level: Needs assistance Equipment used: Rolling walker (2 wheeled) Transfers: Sit to/from Stand Sit to Stand: Min guard         General transfer comment: min guard for safety  Ambulation/Gait Ambulation/Gait assistance: Min guard Gait Distance (Feet): 35 Feet Assistive device: Rolling walker (2 wheeled) Gait Pattern/deviations: Step-through pattern;Decreased stride length Gait velocity: decreased   General Gait Details: min guard for safety   Stairs             Wheelchair Mobility    Modified Rankin (Stroke Patients Only)       Balance Overall balance assessment: Mild deficits observed, not formally tested                                           Cognition Arousal/Alertness: Awake/alert Behavior During Therapy: WFL for tasks assessed/performed;Flat affect Overall Cognitive Status: No family/caregiver present to determine baseline cognitive functioning                                 General Comments: Mildly flat. Very conversate today, enjoying talking about his family.      Exercises General Exercises - Lower Extremity Ankle Circles/Pumps: AROM;Both;20 reps Heel Slides: AROM;Right;Left;10 reps Straight Leg Raises: AROM;Right;Left;10 reps    General Comments General comments (skin integrity, edema, etc.): VSS on RA      Pertinent Vitals/Pain Pain Assessment: No/denies pain    Home Living                      Prior Function            PT Goals (current goals can now be found in the care plan section) Acute Rehab PT Goals Patient Stated Goal: to go home Progress towards PT goals: Progressing toward goals    Frequency    Min 3X/week      PT Plan Current plan remains appropriate    Co-evaluation              AM-PAC PT "6 Clicks" Mobility   Outcome Measure  Help  needed turning from your back to your side while in a flat bed without using bedrails?: A Little Help needed moving from lying on your back to sitting on the side of a flat bed without using bedrails?: A Little Help needed moving to and from a bed to a chair (including a wheelchair)?: A Little Help needed standing up from a chair using your arms (e.g., wheelchair or bedside chair)?: A Little Help needed to walk in hospital room?: A Little Help needed climbing 3-5 steps with a railing? : A Little 6 Click Score: 18    End of Session   Activity Tolerance: Patient tolerated treatment well Patient left: in chair;with call bell/phone within reach Nurse Communication: Mobility status PT Visit Diagnosis: Unsteadiness on feet (R26.81);Muscle weakness (generalized) (M62.81);Repeated falls  (R29.6);Difficulty in walking, not elsewhere classified (R26.2)     Time: 1610-9604 PT Time Calculation (min) (ACUTE ONLY): 28 min  Charges:  $Gait Training: 8-22 mins $Therapeutic Exercise: 8-22 mins                     Aida Raider, PT  Office # [phone] Pager [phone]    Jose Patel 03/28/2021, 12:27 PM

## 2021-03-28 NOTE — NC FL2 (Signed)
[place] MEDICAID FL2 LEVEL OF CARE SCREENING TOOL     IDENTIFICATION  Patient Name: Jose Patel Birthdate: 1933-08-12 Sex: male Admission Date (Current Location): 03/24/2021  [facility] and [place] Number:  Producer, television/film/video and Address:  [facility] [place], [address] [place], [place] [address]      Provider Number: [id number]  Attending Physician Name and Address:  David Stall, Darin Engels, MD  Relative Name and Phone Number:  Regino Schultze (Daughter)   [phone]    Current Level of Care:   Recommended Level of Care: Skilled Nursing Facility Prior Approval Number:    Date Approved/Denied:   PASRR Number:    Discharge Plan: SNF    Current Diagnoses: Patient Active Problem List   Diagnosis Date Noted   Elevated troponin 03/25/2021   Weakness 03/25/2021   Acute on chronic combined systolic and diastolic CHF (congestive heart failure) (HCC) 03/24/2021   COVID-19 virus infection 03/24/2021   Atypical atrial flutter (HCC) 11/07/2019   Secondary hypercoagulable state (HCC) 11/07/2019   Pacemaker 07/07/2019   Tachycardia-bradycardia syndrome (HCC)    Paroxysmal atrial flutter (HCC)    Paroxysmal A-fib (HCC) 05/14/2019   Dyslipidemia (high LDL; low HDL) 03/26/2019   SSS (sick sinus syndrome) (HCC) 03/26/2019   Fatigue 01/16/2019   Persistent atrial fibrillation (HCC)    Vitamin D deficiency 11/20/2018   Aortic valve regurgitation 07/07/2018   Sinus bradycardia 07/07/2018   Long term (current) use of anticoagulants 07/07/2018   Diastolic dysfunction 07/07/2018   Hypercholesteremia 07/07/2018   Mild dilation of ascending aorta (HCC) 07/07/2018   OSA (obstructive sleep apnea)    Essential hypertension    Diabetes mellitus, type 2 (HCC)    Coronary artery disease    Paroxysmal atrial fibrillation (HCC)    CHF (congestive heart failure) (HCC)     Orientation RESPIRATION BLADDER Height & Weight     Self, Time, Situation, Place  Normal  Incontinent, External catheter Weight: 159 lb 8 oz (72.3 kg) Height:  5\' 9"  (175.3 cm)  BEHAVIORAL SYMPTOMS/MOOD NEUROLOGICAL BOWEL NUTRITION STATUS      Incontinent Diet (See DC Summary)  AMBULATORY STATUS COMMUNICATION OF NEEDS Skin   Limited Assist Verbally Normal                       Personal Care Assistance Level of Assistance  Bathing, Feeding, Dressing Bathing Assistance: Limited assistance Feeding assistance: Independent Dressing Assistance: Limited assistance     Functional Limitations Info  Sight, Speech, Hearing Sight Info: Adequate Hearing Info: Adequate Speech Info: Adequate    SPECIAL CARE FACTORS FREQUENCY                       Contractures Contractures Info: Not present    Additional Factors Info  Allergies, Code Status Code Status Info: Full Allergies Info: Aricept (Donepezil)   Tropicamide   Adhesive (Tape)           Current Medications (03/28/2021):  This is the current hospital active medication list Current Facility-Administered Medications  Medication Dose Route Frequency Provider Last Rate Last Admin   acetaminophen (TYLENOL) tablet 650 mg  650 mg Oral Q6H PRN Eduard Clos, MD       Or   acetaminophen (TYLENOL) suppository 650 mg  650 mg Rectal Q6H PRN Eduard Clos, MD       amiodarone (PACERONE) tablet 200 mg  200 mg Oral Daily Eduard Clos, MD   200  mg at 03/28/21 1015   docusate sodium (COLACE) capsule 100 mg  100 mg Oral Daily Eduard Clos, MD   100 mg at 03/28/21 1015   furosemide (LASIX) tablet 20 mg  20 mg Oral Daily Marinda Elk, MD   20 mg at 03/28/21 1015   hydrALAZINE (APRESOLINE) tablet 50 mg  50 mg Oral BID Eduard Clos, MD   50 mg at 03/28/21 1015   insulin aspart (novoLOG) injection 0-6 Units  0-6 Units Subcutaneous TID WC Eduard Clos, MD       losartan (COZAAR) tablet 100 mg  100 mg Oral Daily Eduard Clos, MD   100 mg at 03/28/21 1015   melatonin tablet  5 mg  5 mg Oral QHS PRN Marinda Elk, MD   5 mg at 03/27/21 2225   memantine (NAMENDA) tablet 10 mg  10 mg Oral BID Eduard Clos, MD   10 mg at 03/28/21 1015   metoprolol succinate (TOPROL-XL) 24 hr tablet 50 mg  50 mg Oral Daily Eduard Clos, MD   50 mg at 03/28/21 1015   ondansetron (ZOFRAN) injection 4 mg  4 mg Intravenous Q6H PRN Marinda Elk, MD   4 mg at 03/26/21 1717   potassium chloride (KLOR-CON) CR tablet 10 mEq  10 mEq Oral Daily Marinda Elk, MD   10 mEq at 03/28/21 1015   remdesivir 100 mg in sodium chloride 0.9 % 100 mL IVPB  100 mg Intravenous Daily Eduard Clos, MD 200 mL/hr at 03/28/21 1023 100 mg at 03/28/21 1023   Rivaroxaban (XARELTO) tablet 15 mg  15 mg Oral Q supper Marinda Elk, MD   15 mg at 03/26/21 1721   tamsulosin (FLOMAX) capsule 0.4 mg  0.4 mg Oral Daily Eduard Clos, MD   0.4 mg at 03/28/21 1015   traZODone (DESYREL) tablet 50 mg  50 mg Oral QHS PRN Eduard Clos, MD   50 mg at 03/27/21 2225     Discharge Medications: Please see discharge summary for a list of discharge medications.  Relevant Imaging Results:  Relevant Lab Results:   Additional Information SSN: [id number]; Pfizer COVID-19 Vaccine 07/19/2020 , 09/04/2019 , 08/14/2019  Ivette Loyal, LCSWA

## 2021-03-28 NOTE — TOC Progression Note (Signed)
Transition of Care [facility]) - Progression Note    Patient Details  Name: Jose Patel MRN: [id number] Date of Birth: 30-Aug-1933  Transition of Care [facility]) CM/SW Contact  Reece Agar, [place] Phone Number: 03/28/2021, 4:57 PM  Clinical Narrative:    CSW attempted to contact pt daughter, there was no answer CSW will follow up with offers at a later time.   Expected Discharge Plan: [place] Barriers to Discharge: SNF Covid, Continued Medical Work up, SNF Pending bed offer  Expected Discharge Plan and Services Expected Discharge Plan: [place] arrangements for the past 2 months: Single Family Home Expected Discharge Date: 03/28/21                                     Social Determinants of Health (SDOH) Interventions    Readmission Risk Interventions Readmission Risk Prevention Plan 05/16/2019  Transportation Screening Complete  PCP or Specialist Appt within 5-7 Days Complete  Home Care Screening Complete  Medication Review (RN CM) Complete  Some recent data might be hidden

## 2021-03-28 NOTE — Discharge Summary (Signed)
No changePhysician Discharge Summary  Jose Patel HQI:[id number] DOB: August 06, 1933 DOA: 03/24/2021  PCP: [name], NP  Admit date: 03/24/2021 Discharge date: 03/28/2021  Admitted From: Home Disposition:  SNF  Recommendations for Outpatient Follow-up:  Follow up with PCP in 1-2 weeks Please obtain BMP/CBC in one week Perative care to be follow-up at skilled nursing facility to address goals of care and end-of-life   Home Health:No Equipment/Devices:None  Discharge Condition:Stable CODE STATUS:Full Diet recommendation: Heart Healthy   Brief/Interim Summary:  87 y.o. male past medical history of chronic combined systolic and diastolic heart failure with an EF of 40% in December 2021 history of sinus node dysfunction status post pacemaker, atrial fibrillation status post ablation on amiodarone aortic insufficiency sleep apnea who had recently an increase in his Lasix due to worsening lower extremity edema came into the ER because of weakness, cough and a fall, imaging was unremarkable he denies any fever.  Discharge Diagnoses:  Principal Problem:   COVID-19 virus infection Active Problems:   Essential hypertension   Diabetes mellitus, type 2 (HCC)   Paroxysmal atrial fibrillation (HCC)   Acute on chronic combined systolic and diastolic CHF (congestive heart failure) (HCC)   Elevated troponin   Weakness  Generalized weakness and fall secondary to COVID-19 viral infection: He was started on IV remdesivir steroids he was satting greater 90% steroids were discontinued. Physical therapy evaluated the patient recommended skilled nursing facility he completed 4-day course of IV remdesivir.  Chronic diastolic and systolic heart failure: Were made to his medication.  Central hypertension: No changes made to his medication.  Diabetes mellitus type 2: Presently on no oral hypoglycemic agents his A1c was 5.7.  Cognitive decline: Continue Namenda.  Chronic atrial  fibrillation: Continue amiodarone and Xarelto.  Elevated troponins: Likely demand ischemia in the setting of COVID-19 he denied chest pain or shortness of breath.  Discharge Instructions  Discharge Instructions     Diet - low sodium heart healthy   Complete by: As directed    Increase activity slowly   Complete by: As directed       Allergies as of 03/28/2021       Reactions   Aricept [donepezil] Diarrhea, Nausea And Vomiting, Other (See Comments)   Cannot tolerate- bradycardia, also   Tropicamide Nausea And Vomiting   Used to dilate eyes (might be this- was switched to something this was tolerable, after this)   Adhesive [tape] Rash        Medication List     TAKE these medications    amiodarone 200 MG tablet Commonly known as: PACERONE Take 1 tablet (200 mg total) by mouth daily.   amoxicillin 500 MG capsule Commonly known as: AMOXIL Take 2,000 mg by mouth See admin instructions. Take 2,000 mg by mouth one hour prior to dental appointment   docusate sodium 100 MG capsule Commonly known as: COLACE Take 100 mg by mouth daily.   furosemide 40 MG tablet Commonly known as: LASIX Take 40 mg by mouth daily.   gabapentin 100 MG capsule Commonly known as: NEURONTIN Take 1 capsule (100 mg total) by mouth daily as needed. What changed: reasons to take this   hydrALAZINE 50 MG tablet Commonly known as: APRESOLINE Take 1 tablet (50 mg total) by mouth 2 (two) times daily.   losartan 100 MG tablet Commonly known as: COZAAR Take 1 tablet (100 mg total) by mouth daily.   melatonin 5 MG Tabs Take 5 mg by mouth at bedtime as needed (for sleep).  memantine 10 MG tablet Commonly known as: NAMENDA Take 1 tablet (10 mg total) by mouth 2 (two) times daily.   metoprolol succinate 50 MG 24 hr tablet Commonly known as: TOPROL-XL Take 1 tablet (50 mg total) by mouth daily.   multivitamin tablet Take 1 tablet by mouth daily.   nystatin cream Commonly known as:  MYCOSTATIN Apply 1 application topically 2 (two) times daily. What changed:  when to take this reasons to take this   potassium chloride 10 MEQ tablet Commonly known as: KLOR-CON Take 1 tablet (10 mEq total) by mouth daily.   rivaroxaban 20 MG Tabs tablet Commonly known as: Xarelto Take 1 tablet (20 mg total) by mouth daily with supper.   tamsulosin 0.4 MG Caps capsule Commonly known as: FLOMAX Take 1 capsule (0.4 mg total) by mouth daily.   traZODone 50 MG tablet Commonly known as: DESYREL To start with 25 mg by mouth at bedtime, can increase to 50 mg   Vitamin D3 50 MCG (2000 UT) Tabs Take 2,000 Units by mouth daily with breakfast.        Allergies  Allergen Reactions   Aricept [Donepezil] Diarrhea, Nausea And Vomiting and Other (See Comments)    Cannot tolerate- bradycardia, also   Tropicamide Nausea And Vomiting    Used to dilate eyes (might be this- was switched to something this was tolerable, after this)   Adhesive [Tape] Rash    Consultations: None   Procedures/Studies: DG Chest 2 View  Result Date: 03/24/2021 CLINICAL DATA:  Weakness, cough EXAM: CHEST - 2 VIEW COMPARISON:  03/16/2021 FINDINGS: Lungs are clear. No pneumothorax or pleural effusion. Cardiac size is mildly enlarged, unchanged. Left subclavian dual lead pacemaker is unchanged. Osseous structures are age-appropriate. Pulmonary vascularity is normal. IMPRESSION: No active cardiopulmonary disease. Electronically Signed   By: Helyn Numbers M.D.   On: 03/24/2021 22:03   DG Chest 2 View  Result Date: 03/17/2021 CLINICAL DATA:  Diastolic congestive heart failure, unspecified HF chronicity (HCC) Shortness of breath fatigue EXAM: CHEST - 2 VIEW COMPARISON:  07/08/2019 FINDINGS: Mild interstitial edema or infiltrates involving bases more than apices. Mild cardiomegaly. Aortic Atherosclerosis (ICD10-170.0). Stable left subclavian dual lead transvenous pacemaker. Small bilateral pleural effusions have  developed.  No pneumothorax. Visualized bones unremarkable. IMPRESSION: 1. New mild bilateral interstitial edema with small effusions and cardiomegaly suggesting CHF. Electronically Signed   By: Corlis Leak M.D.   On: 03/17/2021 10:45   CT HEAD WO CONTRAST ( )  Result Date: 03/24/2021 CLINICAL DATA:  Facial trauma. Patient reports increased weakness and falls. EXAM: CT HEAD WITHOUT CONTRAST TECHNIQUE: Contiguous axial images were obtained from the base of the skull through the vertex without intravenous contrast. COMPARISON:  None. FINDINGS: Brain: No acute hemorrhage. Generalized cerebral and cerebellar atrophy. Ventricular prominence is likely due to central atrophy. There is moderate to advanced periventricular and deep white matter hypodensity typical of chronic small vessel ischemia. Remote lacunar infarct in the left thalamus and basal ganglia. No evidence of acute ischemia. No subdural or extra-axial collection. No midline shift or mass effect. Vascular: Atherosclerosis of skullbase vasculature without hyperdense vessel or abnormal calcification. Skull: No fracture or focal lesion. Sinuses/Orbits: Mucous retention cyst in the left maxillary sinus. No fracture or acute findings. Bilateral cataract resection. No mastoid effusion. Other: No confluent scalp contusion. IMPRESSION: 1. No acute intracranial abnormality. No skull fracture. 2. Generalized atrophy and chronic small vessel ischemia. Remote lacunar infarcts in the left thalamus and basal ganglia. Electronically Signed  By: Narda Rutherford M.D.   On: 03/24/2021 22:17   CT Cervical Spine Wo Contrast  Result Date: 03/24/2021 CLINICAL DATA:  Neck trauma (Age >= 65y) Increased weakness and falls. EXAM: CT CERVICAL SPINE WITHOUT CONTRAST TECHNIQUE: Multidetector CT imaging of the cervical spine was performed without intravenous contrast. Multiplanar CT image reconstructions were also generated. COMPARISON:  Cervical MRI 06/18/2019 FINDINGS:  Alignment: Straightening of normal lordosis. No traumatic subluxation. Skull base and vertebrae: No acute fracture. Vertebral body heights are maintained. The dens and skull base are intact. Soft tissues and spinal canal: No prevertebral fluid or swelling. No visible canal hematoma. Disc levels: Diffuse endplate spurring. Disc space narrowing from C4-C5 through C6-C7. There is multilevel facet hypertrophy. Upper chest: No acute or unexpected findings. Other: None. IMPRESSION: Multilevel degenerative change throughout the cervical spine without acute fracture or subluxation. Electronically Signed   By: Narda Rutherford M.D.   On: 03/24/2021 22:20   (Echo, Carotid, EGD, Colonoscopy, ERCP)    Subjective: No complaints  Discharge Exam: Vitals:   03/27/21 2159 03/28/21 0555  BP: (!) 156/70 (!) 154/73  Pulse: 60 60  Resp: 19 18  Temp: 98.2 F (36.8 C) 98.5 F (36.9 C)  SpO2: 99% 98%   Vitals:   03/27/21 0809 03/27/21 1811 03/27/21 2159 03/28/21 0555  BP: (!) 146/69 (!) 141/65 (!) 156/70 (!) 154/73  Pulse: 63 60 60 60  Resp: 18 18 19 18   Temp: 97.8 F (36.6 C) 98.7 F (37.1 C) 98.2 F (36.8 C) 98.5 F (36.9 C)  TempSrc: Oral Oral Oral Oral  SpO2: 98% 91% 99% 98%  Weight:    72.3 kg  Height:        General: Pt is alert, awake, not in acute distress Cardiovascular: RRR, S1/S2 +, no rubs, no gallops Respiratory: CTA bilaterally, no wheezing, no rhonchi Abdominal: Soft, NT, ND, bowel sounds + Extremities: no edema, no cyanosis    The results of significant diagnostics from this hospitalization (including imaging, microbiology, ancillary and laboratory) are listed below for reference.     Microbiology: Recent Results (from the past 240 hour(s))  Resp Panel by RT-PCR (Flu A&B, Covid) Nasopharyngeal Swab     Status: Abnormal   Collection Time: 03/24/21 10:31 PM   Specimen: Nasopharyngeal Swab; Nasopharyngeal(NP) swabs in vial transport medium  Result Value Ref Range Status   SARS  Coronavirus 2 by RT PCR POSITIVE (A) NEGATIVE Final    Comment: RESULT CALLED TO, READ BACK BY AND VERIFIED WITH: KELLIE NEAL RN AT 2323 ON 03/24/21 BY I.SUGUT (NOTE) SARS-CoV-2 target nucleic acids are DETECTED.  The SARS-CoV-2 RNA is generally detectable in upper respiratory specimens during the acute phase of infection. Positive results are indicative of the presence of the identified virus, but do not rule out bacterial infection or co-infection with other pathogens not detected by the test. Clinical correlation with patient history and other diagnostic information is necessary to determine patient infection status. The expected result is Negative.  Fact Sheet for Patients: BloggerCourse.com  Fact Sheet for Healthcare Providers: SeriousBroker.it  This test is not yet approved or cleared by the [place] FDA and  has been authorized for detection and/or diagnosis of SARS-CoV-2 by FDA under an Emergency Use Authorization (EUA).  This EUA will remain in effect (meaning this  test can be used) for the duration of  the COVID-19 declaration under Section 564(b)(1) of the Act, 21 U.S.C. section 360bbb-3(b)(1), unless the authorization is terminated or revoked sooner.  Influenza A by PCR NEGATIVE NEGATIVE Final   Influenza B by PCR NEGATIVE NEGATIVE Final    Comment: (NOTE) The Xpert Xpress SARS-CoV-2/FLU/RSV plus assay is intended as an aid in the diagnosis of influenza from Nasopharyngeal swab specimens and should not be used as a sole basis for treatment. Nasal washings and aspirates are unacceptable for Xpert Xpress SARS-CoV-2/FLU/RSV testing.  Fact Sheet for Patients: BloggerCourse.com  Fact Sheet for Healthcare Providers: SeriousBroker.it  This test is not yet approved or cleared by the [place] FDA and has been authorized for detection and/or diagnosis of  SARS-CoV-2 by FDA under an Emergency Use Authorization (EUA). This EUA will remain in effect (meaning this test can be used) for the duration of the COVID-19 declaration under Section 564(b)(1) of the Act, 21 U.S.C. section 360bbb-3(b)(1), unless the authorization is terminated or revoked.  Performed at [facility], [address] [place], [place] [address]      Labs: BNP (last 3 results) Recent Labs    03/16/21 1404 03/24/21 1952  BNP 1,374* 1,468.0*   Basic Metabolic Panel: Recent Labs  Lab 03/24/21 1952 03/26/21 0403 03/27/21 0436  NA 137 138 138  K 4.3 3.3* 4.6  CL 100 104 106  CO2 25 26 26   GLUCOSE 151* 110* 101*  BUN 27* 32* 38*  CREATININE 1.24 1.30* 1.23  CALCIUM 8.9 8.5* 8.3*   Liver Function Tests: Recent Labs  Lab 03/26/21 0403 03/27/21 0436  AST 154* 231*  ALT 186* 344*  ALKPHOS 88 80  BILITOT 1.0 0.8  PROT 5.9* 5.7*  ALBUMIN 2.8* 2.7*   No results for input(s): LIPASE, AMYLASE in the last 168 hours. No results for input(s): AMMONIA in the last 168 hours. CBC: Recent Labs  Lab 03/24/21 1952 03/26/21 0403 03/27/21 0436  WBC 10.3 6.9 6.5  NEUTROABS  --  5.0 4.1  HGB 15.0 13.8 13.7  HCT 45.3 40.3 41.6  MCV 95.2 93.3 93.9  PLT 259 228 207   Cardiac Enzymes: No results for input(s): CKTOTAL, CKMB, CKMBINDEX, TROPONINI in the last 168 hours. BNP: Invalid input(s): POCBNP CBG: Recent Labs  Lab 03/27/21 0614 03/27/21 1157 03/27/21 1802 03/27/21 2157 03/28/21 0600  GLUCAP 97 119* 123* 103* 111*   D-Dimer Recent Labs    03/26/21 0403 03/27/21 0436  DDIMER 0.42 <0.27   Hgb A1c No results for input(s): HGBA1C in the last 72 hours. Lipid Profile No results for input(s): CHOL, HDL, LDLCALC, TRIG, CHOLHDL, LDLDIRECT in the last 72 hours. Thyroid function studies No results for input(s): TSH, T4TOTAL, T3FREE, THYROIDAB in the last 72 hours.  Invalid input(s): FREET3 Anemia work up No results for input(s): VITAMINB12,  FOLATE, FERRITIN, TIBC, IRON, RETICCTPCT in the last 72 hours. Urinalysis    Component Value Date/Time   COLORURINE YELLOW 03/24/2021 1954   APPEARANCEUR CLEAR 03/24/2021 1954   LABSPEC 1.010 03/24/2021 1954   PHURINE 6.5 03/24/2021 1954   GLUCOSEU NEGATIVE 03/24/2021 1954   HGBUR NEGATIVE 03/24/2021 1954   BILIRUBINUR NEGATIVE 03/24/2021 1954   BILIRUBINUR small 08/12/2018 1250   KETONESUR NEGATIVE 03/24/2021 1954   PROTEINUR NEGATIVE 03/24/2021 1954   UROBILINOGEN negative (A) 08/12/2018 1250   NITRITE NEGATIVE 03/24/2021 1954   LEUKOCYTESUR NEGATIVE 03/24/2021 1954   Sepsis Labs Invalid input(s): PROCALCITONIN,  WBC,  LACTICIDVEN Microbiology Recent Results (from the past 240 hour(s))  Resp Panel by RT-PCR (Flu A&B, Covid) Nasopharyngeal Swab     Status: Abnormal   Collection Time: 03/24/21 10:31 PM  Specimen: Nasopharyngeal Swab; Nasopharyngeal(NP) swabs in vial transport medium  Result Value Ref Range Status   SARS Coronavirus 2 by RT PCR POSITIVE (A) NEGATIVE Final    Comment: RESULT CALLED TO, READ BACK BY AND VERIFIED WITH: KELLIE NEAL RN AT 2323 ON 03/24/21 BY I.SUGUT (NOTE) SARS-CoV-2 target nucleic acids are DETECTED.  The SARS-CoV-2 RNA is generally detectable in upper respiratory specimens during the acute phase of infection. Positive results are indicative of the presence of the identified virus, but do not rule out bacterial infection or co-infection with other pathogens not detected by the test. Clinical correlation with patient history and other diagnostic information is necessary to determine patient infection status. The expected result is Negative.  Fact Sheet for Patients: BloggerCourse.com  Fact Sheet for Healthcare Providers: SeriousBroker.it  This test is not yet approved or cleared by the [place] FDA and  has been authorized for detection and/or diagnosis of SARS-CoV-2 by FDA under an  Emergency Use Authorization (EUA).  This EUA will remain in effect (meaning this  test can be used) for the duration of  the COVID-19 declaration under Section 564(b)(1) of the Act, 21 U.S.C. section 360bbb-3(b)(1), unless the authorization is terminated or revoked sooner.     Influenza A by PCR NEGATIVE NEGATIVE Final   Influenza B by PCR NEGATIVE NEGATIVE Final    Comment: (NOTE) The Xpert Xpress SARS-CoV-2/FLU/RSV plus assay is intended as an aid in the diagnosis of influenza from Nasopharyngeal swab specimens and should not be used as a sole basis for treatment. Nasal washings and aspirates are unacceptable for Xpert Xpress SARS-CoV-2/FLU/RSV testing.  Fact Sheet for Patients: BloggerCourse.com  Fact Sheet for Healthcare Providers: SeriousBroker.it  This test is not yet approved or cleared by the [place] FDA and has been authorized for detection and/or diagnosis of SARS-CoV-2 by FDA under an Emergency Use Authorization (EUA). This EUA will remain in effect (meaning this test can be used) for the duration of the COVID-19 declaration under Section 564(b)(1) of the Act, 21 U.S.C. section 360bbb-3(b)(1), unless the authorization is terminated or revoked.  Performed at [facility], [address] [place], [place] [address]      Time coordinating discharge: Over 30 minutes  SIGNED:   Marinda Elk, MD  [facility] 03/28/2021, 8:37 AM Pager   If 7PM-7AM, please contact night-coverage [email or web address] Password [email or web address]

## 2021-03-28 NOTE — Plan of Care (Signed)
  Problem: Education: Goal: Ability to demonstrate management of disease process will improve Outcome: Progressing Goal: Ability to verbalize understanding of medication therapies will improve Outcome: Progressing Goal: Individualized Educational Video(s) Outcome: Progressing   Problem: Activity: Goal: Capacity to carry out activities will improve Outcome: Progressing   Problem: Cardiac: Goal: Ability to achieve and maintain adequate cardiopulmonary perfusion will improve Outcome: Progressing   Problem: Education: Goal: Knowledge of General Education information will improve Description: Including pain rating scale, medication(s)/side effects and non-pharmacologic comfort measures Outcome: Progressing   Problem: Health Behavior/Discharge Planning: Goal: Ability to manage health-related needs will improve Outcome: Progressing   Problem: Clinical Measurements: Goal: Ability to maintain clinical measurements within normal limits will improve Outcome: Progressing Goal: Will remain free from infection Outcome: Progressing Goal: Diagnostic test results will improve Outcome: Progressing Goal: Respiratory complications will improve Outcome: Progressing Goal: Cardiovascular complication will be avoided Outcome: Progressing   Problem: Activity: Goal: Risk for activity intolerance will decrease Outcome: Progressing   Problem: Nutrition: Goal: Adequate nutrition will be maintained Outcome: Progressing   Problem: Coping: Goal: Level of anxiety will decrease Outcome: Progressing   Problem: Elimination: Goal: Will not experience complications related to bowel motility Outcome: Progressing   Problem: Safety: Goal: Ability to remain free from injury will improve Outcome: Progressing

## 2021-03-29 LAB — GLUCOSE, CAPILLARY
Glucose-Capillary: 94 mg/dL (ref 70–99)
Glucose-Capillary: 121 mg/dL — ABNORMAL HIGH (ref 70–99)
Glucose-Capillary: 159 mg/dL — ABNORMAL HIGH (ref 70–99)
Glucose-Capillary: 145 mg/dL — ABNORMAL HIGH (ref 70–99)

## 2021-03-29 NOTE — Progress Notes (Signed)
Occupational Therapy Treatment Patient Details Name: Jose Patel MRN: [id number] DOB: 02-22-1934 Today's Date: 03/29/2021    History of present illness 87 y/o male presented to ED on 8/25 for weakness, cough and fall. Tested positive for COVID 19. PMH: HTN, CHF, DM type 2, Afib, hx of sinus node dysfunction s/p pacemaker placement, aortic insufficiency, sleep apnea, hx of stroke   OT comments  Pt agreeable for OOB ADL activity. Mod A for bed mobility and required boost into standing. Multimodal cues for safety with RW, and able to stand at sink for oral care and face washing. Emphasis on energy conservation during ADL and during recovery period. States that his children will be assisting with IADL and for safety initially. OT plan remains appropriate. POst-acute OT is important to maximize safety and independence in ADL and functional transfers and prevent readmission and falls.    Follow Up Recommendations  SNF;Supervision/Assistance - 24 hour (If pt does not agree to SNF or family can assist with 24/7, HHOT is appropriate.)    Equipment Recommendations  None recommended by OT    Recommendations for Other Services      Precautions / Restrictions Precautions Precautions: Fall;Other (comment) Precaution Comments: hx of falls; +COVID Restrictions Weight Bearing Restrictions: No       Mobility Bed Mobility Overal bed mobility: Needs Assistance Bed Mobility: Supine to Sit     Supine to sit: Mod assist     General bed mobility comments: +rail, assist to elevate trunk    Transfers Overall transfer level: Needs assistance Equipment used: Rolling walker (2 wheeled) Transfers: Sit to/from Stand Sit to Stand: Min assist         General transfer comment: min A for boost from bed    Balance Overall balance assessment: Mild deficits observed, not formally tested                                         ADL either performed or assessed with clinical  judgement   ADL Overall ADL's : Needs assistance/impaired     Grooming: Supervision/safety;Sitting;Standing;Wash/dry face;Oral care Grooming Details (indicate cue type and reason): standing at sink with heavy leaning (forearms) on sink surface             Lower Body Dressing: Sitting/lateral leans;Moderate assistance Lower Body Dressing Details (indicate cue type and reason): extended time required Toilet Transfer: Min guard;Cueing for safety;RW;Ambulation Toilet Transfer Details (indicate cue type and reason): cues for hand placement Toileting- Clothing Manipulation and Hygiene: Minimal assistance;Sitting/lateral lean;Sit to/from stand Toileting - Clothing Manipulation Details (indicate cue type and reason): for boost and manage gown     Functional mobility during ADLs: Minimal assistance;Rolling walker;Cueing for safety General ADL Comments: Pt limited by decreased activity tolerance, required vc for safety with RW. Energy conservation education provided     Vision   Vision Assessment?: No apparent visual deficits   Perception     Praxis      Cognition Arousal/Alertness: Awake/alert Behavior During Therapy: WFL for tasks assessed/performed;Flat affect Overall Cognitive Status: No family/caregiver present to determine baseline cognitive functioning                                 General Comments: Mildly flat. Very conversate today, enjoying talking about his family.        Exercises  Shoulder Instructions       General Comments VSS on RA    Pertinent Vitals/ Pain          Home Living                                          Prior Functioning/Environment              Frequency  Min 2X/week        Progress Toward Goals  OT Goals(current goals can now be found in the care plan section)  Progress towards OT goals: Progressing toward goals  Acute Rehab OT Goals Patient Stated Goal: to go home OT Goal  Formulation: With patient Time For Goal Achievement: 04/10/21 Potential to Achieve Goals: Good ADL Goals Additional ADL Goal #1: Pt will perform x10 mins of OOB ADL routine with 1 seated rest break <1 minute. Additional ADL Goal #2: Pt will state 3 energy conservation strategies in order to increase activity tolerance.  Plan Discharge plan remains appropriate    Co-evaluation                 AM-PAC OT "6 Clicks" Daily Activity     Outcome Measure   Help from another person eating meals?: None Help from another person taking care of personal grooming?: A Little Help from another person toileting, which includes using toliet, bedpan, or urinal?: A Little Help from another person bathing (including washing, rinsing, drying)?: A Little Help from another person to put on and taking off regular upper body clothing?: None Help from another person to put on and taking off regular lower body clothing?: A Lot 6 Click Score: 19    End of Session Equipment Utilized During Treatment: Rolling walker  OT Visit Diagnosis: Unsteadiness on feet (R26.81);Muscle weakness (generalized) (M62.81)   Activity Tolerance Patient limited by fatigue   Patient Left in chair;with call bell/phone within reach   Nurse Communication Mobility status;Other (comment) (pt not  on chair alarm, but stating "I can't get up without help." RN aware.)        Time: 0832-0903 OT Time Calculation (min): 31 min  Charges: OT General Charges $OT Visit: 1 Visit OT Treatments $Self Care/Home Management : 8-22 mins $Therapeutic Activity: 8-22 mins  Nyoka Cowden OTR/L Acute Rehabilitation Services Pager: [phone] Office: [phone]   Evern Bio Dondi Burandt 03/29/2021, 10:15 AM

## 2021-03-29 NOTE — TOC Progression Note (Addendum)
Transition of Care [facility]) - Progression Note    Patient Details  Name: Jose Patel MRN: [id number] Date of Birth: 09/01/1933  Transition of Care [facility]) CM/SW Contact  Reece Agar, [place] Phone Number: 03/29/2021, 6:49 PM  Clinical Narrative:    CSW contacted all pt children no answer VM left. Pt only option is [place] and CSW is not able to contact any family to help with making pt decision.  Pt daughter [name] (works at [employer] will be off tomorrow) would like CSW to follow up with [employer]. If there is no availability their pt daughter will take the pt home with [facility]. CSW contacted [place] with no answer but left a VM with Simona Huh. CSW will continue to follow for DC planning needs.   Expected Discharge Plan: [place] Barriers to Discharge: SNF Covid, Continued Medical Work up, SNF Pending bed offer  Expected Discharge Plan and Services Expected Discharge Plan: [place] arrangements for the past 2 months: Single Family Home Expected Discharge Date: 03/28/21                                     Social Determinants of Health (SDOH) Interventions    Readmission Risk Interventions Readmission Risk Prevention Plan 05/16/2019  Transportation Screening Complete  PCP or Specialist Appt within 5-7 Days Complete  Home Care Screening Complete  Medication Review (RN CM) Complete  Some recent data might be hidden

## 2021-03-29 NOTE — Plan of Care (Signed)
  Problem: Education: Goal: Ability to demonstrate management of disease process will improve Outcome: Progressing Goal: Ability to verbalize understanding of medication therapies will improve Outcome: Progressing Goal: Individualized Educational Video(s) Outcome: Progressing   Problem: Activity: Goal: Capacity to carry out activities will improve Outcome: Progressing   Problem: Cardiac: Goal: Ability to achieve and maintain adequate cardiopulmonary perfusion will improve Outcome: Progressing   Problem: Education: Goal: Knowledge of General Education information will improve Description: Including pain rating scale, medication(s)/side effects and non-pharmacologic comfort measures Outcome: Progressing   Problem: Health Behavior/Discharge Planning: Goal: Ability to manage health-related needs will improve Outcome: Progressing   Problem: Clinical Measurements: Goal: Ability to maintain clinical measurements within normal limits will improve Outcome: Progressing Goal: Will remain free from infection Outcome: Progressing Goal: Diagnostic test results will improve Outcome: Progressing Goal: Respiratory complications will improve Outcome: Progressing Goal: Cardiovascular complication will be avoided Outcome: Progressing   Problem: Activity: Goal: Risk for activity intolerance will decrease Outcome: Progressing   Problem: Nutrition: Goal: Adequate nutrition will be maintained Outcome: Progressing   Problem: Coping: Goal: Level of anxiety will decrease Outcome: Progressing   Problem: Elimination: Goal: Will not experience complications related to bowel motility Outcome: Progressing   Problem: Safety: Goal: Ability to remain free from injury will improve Outcome: Progressing

## 2021-03-29 NOTE — Discharge Summary (Signed)
Physician Discharge Summary  Jose Patel [id number] DOB: 1934-03-27 DOA: 03/24/2021  PCP: [name], NP  Admit date: 03/24/2021 Discharge date: 03/29/2021  Admitted From: Home Disposition:  SNF  Recommendations for Outpatient Follow-up:  Follow up with PCP in 1-2 weeks Please obtain BMP/CBC in one week Perative care to be follow-up at skilled nursing facility to address goals of care and end-of-life   Home Health:No Equipment/Devices:None  Discharge Condition:Stable CODE STATUS:Full Diet recommendation: Heart Healthy   Brief/Interim Summary:  87 y.o. male past medical history of chronic combined systolic and diastolic heart failure with an EF of 40% in December 2021 history of sinus node dysfunction status post pacemaker, atrial fibrillation status post ablation on amiodarone aortic insufficiency sleep apnea who had recently an increase in his Lasix due to worsening lower extremity edema came into the ER because of weakness, cough and a fall, imaging was unremarkable he denies any fever.  Discharge Diagnoses:  Principal Problem:   COVID-19 virus infection Active Problems:   Essential hypertension   Diabetes mellitus, type 2 (HCC)   Paroxysmal atrial fibrillation (HCC)   Acute on chronic combined systolic and diastolic CHF (congestive heart failure) (HCC)   Elevated troponin   Weakness  Generalized weakness and fall secondary to COVID-19 viral infection: He was started on IV remdesivir steroids he was satting greater 90% steroids were discontinued. Physical therapy evaluated the patient recommended skilled nursing facility he completed 4-day course of IV remdesivir.  Chronic diastolic and systolic heart failure: Were made to his medication.  Central hypertension: No changes made to his medication.  Diabetes mellitus type 2: Presently on no oral hypoglycemic agents his A1c was 5.7.  Cognitive decline: Continue Namenda.  Chronic atrial  fibrillation: Continue amiodarone and Xarelto.  Elevated troponins: Likely demand ischemia in the setting of COVID-19 he denied chest pain or shortness of breath.  Discharge Instructions  Discharge Instructions     Diet - low sodium heart healthy   Complete by: As directed    Increase activity slowly   Complete by: As directed       Allergies as of 03/29/2021       Reactions   Aricept [donepezil] Diarrhea, Nausea And Vomiting, Other (See Comments)   Cannot tolerate- bradycardia, also   Tropicamide Nausea And Vomiting   Used to dilate eyes (might be this- was switched to something this was tolerable, after this)   Adhesive [tape] Rash        Medication List     TAKE these medications    amiodarone 200 MG tablet Commonly known as: PACERONE Take 1 tablet (200 mg total) by mouth daily.   amoxicillin 500 MG capsule Commonly known as: AMOXIL Take 2,000 mg by mouth See admin instructions. Take 2,000 mg by mouth one hour prior to dental appointment   docusate sodium 100 MG capsule Commonly known as: COLACE Take 100 mg by mouth daily.   furosemide 40 MG tablet Commonly known as: LASIX Take 40 mg by mouth daily.   gabapentin 100 MG capsule Commonly known as: NEURONTIN Take 1 capsule (100 mg total) by mouth daily as needed. What changed: reasons to take this   hydrALAZINE 50 MG tablet Commonly known as: APRESOLINE Take 1 tablet (50 mg total) by mouth 2 (two) times daily.   losartan 100 MG tablet Commonly known as: COZAAR Take 1 tablet (100 mg total) by mouth daily.   melatonin 5 MG Tabs Take 5 mg by mouth at bedtime as needed (for sleep).  memantine 10 MG tablet Commonly known as: NAMENDA Take 1 tablet (10 mg total) by mouth 2 (two) times daily.   metoprolol succinate 50 MG 24 hr tablet Commonly known as: TOPROL-XL Take 1 tablet (50 mg total) by mouth daily.   multivitamin tablet Take 1 tablet by mouth daily.   nystatin cream Commonly known as:  MYCOSTATIN Apply 1 application topically 2 (two) times daily. What changed:  when to take this reasons to take this   potassium chloride 10 MEQ tablet Commonly known as: KLOR-CON Take 1 tablet (10 mEq total) by mouth daily.   rivaroxaban 20 MG Tabs tablet Commonly known as: Xarelto Take 1 tablet (20 mg total) by mouth daily with supper.   tamsulosin 0.4 MG Caps capsule Commonly known as: FLOMAX Take 1 capsule (0.4 mg total) by mouth daily.   traZODone 50 MG tablet Commonly known as: DESYREL To start with 25 mg by mouth at bedtime, can increase to 50 mg   Vitamin D3 50 MCG (2000 UT) Tabs Take 2,000 Units by mouth daily with breakfast.        Follow-up Information     [name], NP Follow up on 04/01/2021.   Specialty: Geriatric Medicine Why: @2 :15pm Contact information: [address] [place] [address] [phone]         Croitoru, Rachelle Hora, MD .   Specialty: Cardiology Contact information: [address] [place] [address] [phone]                Allergies  Allergen Reactions   Aricept [Donepezil] Diarrhea, Nausea And Vomiting and Other (See Comments)    Cannot tolerate- bradycardia, also   Tropicamide Nausea And Vomiting    Used to dilate eyes (might be this- was switched to something this was tolerable, after this)   Adhesive [Tape] Rash    Consultations: None   Procedures/Studies: DG Chest 2 View  Result Date: 03/24/2021 CLINICAL DATA:  Weakness, cough EXAM: CHEST - 2 VIEW COMPARISON:  03/16/2021 FINDINGS: Lungs are clear. No pneumothorax or pleural effusion. Cardiac size is mildly enlarged, unchanged. Left subclavian dual lead pacemaker is unchanged. Osseous structures are age-appropriate. Pulmonary vascularity is normal. IMPRESSION: No active cardiopulmonary disease. Electronically Signed   By: Helyn Numbers M.D.   On: 03/24/2021 22:03   DG Chest 2 View  Result Date: 03/17/2021 CLINICAL DATA:  Diastolic  congestive heart failure, unspecified HF chronicity (HCC) Shortness of breath fatigue EXAM: CHEST - 2 VIEW COMPARISON:  07/08/2019 FINDINGS: Mild interstitial edema or infiltrates involving bases more than apices. Mild cardiomegaly. Aortic Atherosclerosis (ICD10-170.0). Stable left subclavian dual lead transvenous pacemaker. Small bilateral pleural effusions have developed.  No pneumothorax. Visualized bones unremarkable. IMPRESSION: 1. New mild bilateral interstitial edema with small effusions and cardiomegaly suggesting CHF. Electronically Signed   By: Corlis Leak M.D.   On: 03/17/2021 10:45   CT HEAD WO CONTRAST ( )  Result Date: 03/24/2021 CLINICAL DATA:  Facial trauma. Patient reports increased weakness and falls. EXAM: CT HEAD WITHOUT CONTRAST TECHNIQUE: Contiguous axial images were obtained from the base of the skull through the vertex without intravenous contrast. COMPARISON:  None. FINDINGS: Brain: No acute hemorrhage. Generalized cerebral and cerebellar atrophy. Ventricular prominence is likely due to central atrophy. There is moderate to advanced periventricular and deep white matter hypodensity typical of chronic small vessel ischemia. Remote lacunar infarct in the left thalamus and basal ganglia. No evidence of acute ischemia. No subdural or extra-axial collection. No midline shift or mass effect.  Vascular: Atherosclerosis of skullbase vasculature without hyperdense vessel or abnormal calcification. Skull: No fracture or focal lesion. Sinuses/Orbits: Mucous retention cyst in the left maxillary sinus. No fracture or acute findings. Bilateral cataract resection. No mastoid effusion. Other: No confluent scalp contusion. IMPRESSION: 1. No acute intracranial abnormality. No skull fracture. 2. Generalized atrophy and chronic small vessel ischemia. Remote lacunar infarcts in the left thalamus and basal ganglia. Electronically Signed   By: Narda Rutherford M.D.   On: 03/24/2021 22:17   CT Cervical Spine  Wo Contrast  Result Date: 03/24/2021 CLINICAL DATA:  Neck trauma (Age >= 65y) Increased weakness and falls. EXAM: CT CERVICAL SPINE WITHOUT CONTRAST TECHNIQUE: Multidetector CT imaging of the cervical spine was performed without intravenous contrast. Multiplanar CT image reconstructions were also generated. COMPARISON:  Cervical MRI 06/18/2019 FINDINGS: Alignment: Straightening of normal lordosis. No traumatic subluxation. Skull base and vertebrae: No acute fracture. Vertebral body heights are maintained. The dens and skull base are intact. Soft tissues and spinal canal: No prevertebral fluid or swelling. No visible canal hematoma. Disc levels: Diffuse endplate spurring. Disc space narrowing from C4-C5 through C6-C7. There is multilevel facet hypertrophy. Upper chest: No acute or unexpected findings. Other: None. IMPRESSION: Multilevel degenerative change throughout the cervical spine without acute fracture or subluxation. Electronically Signed   By: Narda Rutherford M.D.   On: 03/24/2021 22:20   (Echo, Carotid, EGD, Colonoscopy, ERCP)    Subjective: No complaints  Discharge Exam: Vitals:   03/28/21 2237 03/29/21 0414  BP: (!) 165/61 (!) 144/65  Pulse:  61  Resp:  16  Temp:  97.7 F (36.5 C)  SpO2:  95%   Vitals:   03/28/21 1245 03/28/21 2010 03/28/21 2237 03/29/21 0414  BP: (!) 142/64 (!) 157/66 (!) 165/61 (!) 144/65  Pulse: (!) 59 60  61  Resp: 17 16  16   Temp: 98.3 F (36.8 C) 98 F (36.7 C)  97.7 F (36.5 C)  TempSrc: Oral Oral  Oral  SpO2: 98% 95%  95%  Weight:    73.6 kg  Height:        General: Pt is alert, awake, not in acute distress Cardiovascular: RRR, S1/S2 +, no rubs, no gallops Respiratory: CTA bilaterally, no wheezing, no rhonchi Abdominal: Soft, NT, ND, bowel sounds + Extremities: no edema, no cyanosis    The results of significant diagnostics from this hospitalization (including imaging, microbiology, ancillary and laboratory) are listed below for reference.      Microbiology: Recent Results (from the past 240 hour(s))  Resp Panel by RT-PCR (Flu A&B, Covid) Nasopharyngeal Swab     Status: Abnormal   Collection Time: 03/24/21 10:31 PM   Specimen: Nasopharyngeal Swab; Nasopharyngeal(NP) swabs in vial transport medium  Result Value Ref Range Status   SARS Coronavirus 2 by RT PCR POSITIVE (A) NEGATIVE Final    Comment: RESULT CALLED TO, READ BACK BY AND VERIFIED WITH: KELLIE NEAL RN AT 2323 ON 03/24/21 BY I.SUGUT (NOTE) SARS-CoV-2 target nucleic acids are DETECTED.  The SARS-CoV-2 RNA is generally detectable in upper respiratory specimens during the acute phase of infection. Positive results are indicative of the presence of the identified virus, but do not rule out bacterial infection or co-infection with other pathogens not detected by the test. Clinical correlation with patient history and other diagnostic information is necessary to determine patient infection status. The expected result is Negative.  Fact Sheet for Patients: BloggerCourse.com  Fact Sheet for Healthcare Providers: SeriousBroker.it  This test is not yet approved or cleared  by the [place] and  has been authorized for detection and/or diagnosis of SARS-CoV-2 by FDA under an Emergency Use Authorization (EUA).  This EUA will remain in effect (meaning this  test can be used) for the duration of  the COVID-19 declaration under Section 564(b)(1) of the Act, 21 U.S.C. section 360bbb-3(b)(1), unless the authorization is terminated or revoked sooner.     Influenza A by PCR NEGATIVE NEGATIVE Final   Influenza B by PCR NEGATIVE NEGATIVE Final    Comment: (NOTE) The Xpert Xpress SARS-CoV-2/FLU/RSV plus assay is intended as an aid in the diagnosis of influenza from Nasopharyngeal swab specimens and should not be used as a sole basis for treatment. Nasal washings and aspirates are unacceptable for Xpert Xpress  SARS-CoV-2/FLU/RSV testing.  Fact Sheet for Patients: BloggerCourse.com  Fact Sheet for Healthcare Providers: SeriousBroker.it  This test is not yet approved or cleared by the [place] FDA and has been authorized for detection and/or diagnosis of SARS-CoV-2 by FDA under an Emergency Use Authorization (EUA). This EUA will remain in effect (meaning this test can be used) for the duration of the COVID-19 declaration under Section 564(b)(1) of the Act, 21 U.S.C. section 360bbb-3(b)(1), unless the authorization is terminated or revoked.  Performed at [facility], [address] [place], [place] [address]      Labs: BNP (last 3 results) Recent Labs    03/16/21 1404 03/24/21 1952  BNP 1,374* 1,468.0*   Basic Metabolic Panel: Recent Labs  Lab 03/24/21 1952 03/26/21 0403 03/27/21 0436  NA 137 138 138  K 4.3 3.3* 4.6  CL 100 104 106  CO2 25 26 26   GLUCOSE 151* 110* 101*  BUN 27* 32* 38*  CREATININE 1.24 1.30* 1.23  CALCIUM 8.9 8.5* 8.3*   Liver Function Tests: Recent Labs  Lab 03/26/21 0403 03/27/21 0436  AST 154* 231*  ALT 186* 344*  ALKPHOS 88 80  BILITOT 1.0 0.8  PROT 5.9* 5.7*  ALBUMIN 2.8* 2.7*   No results for input(s): LIPASE, AMYLASE in the last 168 hours. No results for input(s): AMMONIA in the last 168 hours. CBC: Recent Labs  Lab 03/24/21 1952 03/26/21 0403 03/27/21 0436  WBC 10.3 6.9 6.5  NEUTROABS  --  5.0 4.1  HGB 15.0 13.8 13.7  HCT 45.3 40.3 41.6  MCV 95.2 93.3 93.9  PLT 259 228 207   Cardiac Enzymes: No results for input(s): CKTOTAL, CKMB, CKMBINDEX, TROPONINI in the last 168 hours. BNP: Invalid input(s): POCBNP CBG: Recent Labs  Lab 03/27/21 2157 03/28/21 0600 03/28/21 1241 03/28/21 2234 03/29/21 0648  GLUCAP 103* 111* 119* 96 94   D-Dimer Recent Labs    03/27/21 0436  DDIMER <0.27   Hgb A1c No results for input(s): HGBA1C in the last 72 hours. Lipid  Profile No results for input(s): CHOL, HDL, LDLCALC, TRIG, CHOLHDL, LDLDIRECT in the last 72 hours. Thyroid function studies No results for input(s): TSH, T4TOTAL, T3FREE, THYROIDAB in the last 72 hours.  Invalid input(s): FREET3 Anemia work up No results for input(s): VITAMINB12, FOLATE, FERRITIN, TIBC, IRON, RETICCTPCT in the last 72 hours. Urinalysis    Component Value Date/Time   COLORURINE YELLOW 03/24/2021 1954   APPEARANCEUR CLEAR 03/24/2021 1954   LABSPEC 1.010 03/24/2021 1954   PHURINE 6.5 03/24/2021 1954   GLUCOSEU NEGATIVE 03/24/2021 1954   HGBUR NEGATIVE 03/24/2021 1954   BILIRUBINUR NEGATIVE 03/24/2021 1954   BILIRUBINUR small 08/12/2018 1250   KETONESUR NEGATIVE 03/24/2021 1954   PROTEINUR NEGATIVE  03/24/2021 1954   UROBILINOGEN negative (A) 08/12/2018 1250   NITRITE NEGATIVE 03/24/2021 1954   LEUKOCYTESUR NEGATIVE 03/24/2021 1954   Sepsis Labs Invalid input(s): PROCALCITONIN,  WBC,  LACTICIDVEN Microbiology Recent Results (from the past 240 hour(s))  Resp Panel by RT-PCR (Flu A&B, Covid) Nasopharyngeal Swab     Status: Abnormal   Collection Time: 03/24/21 10:31 PM   Specimen: Nasopharyngeal Swab; Nasopharyngeal(NP) swabs in vial transport medium  Result Value Ref Range Status   SARS Coronavirus 2 by RT PCR POSITIVE (A) NEGATIVE Final    Comment: RESULT CALLED TO, READ BACK BY AND VERIFIED WITH: KELLIE NEAL RN AT 2323 ON 03/24/21 BY I.SUGUT (NOTE) SARS-CoV-2 target nucleic acids are DETECTED.  The SARS-CoV-2 RNA is generally detectable in upper respiratory specimens during the acute phase of infection. Positive results are indicative of the presence of the identified virus, but do not rule out bacterial infection or co-infection with other pathogens not detected by the test. Clinical correlation with patient history and other diagnostic information is necessary to determine patient infection status. The expected result is Negative.  Fact Sheet for  Patients: BloggerCourse.com  Fact Sheet for Healthcare Providers: SeriousBroker.it  This test is not yet approved or cleared by the [place] FDA and  has been authorized for detection and/or diagnosis of SARS-CoV-2 by FDA under an Emergency Use Authorization (EUA).  This EUA will remain in effect (meaning this  test can be used) for the duration of  the COVID-19 declaration under Section 564(b)(1) of the Act, 21 U.S.C. section 360bbb-3(b)(1), unless the authorization is terminated or revoked sooner.     Influenza A by PCR NEGATIVE NEGATIVE Final   Influenza B by PCR NEGATIVE NEGATIVE Final    Comment: (NOTE) The Xpert Xpress SARS-CoV-2/FLU/RSV plus assay is intended as an aid in the diagnosis of influenza from Nasopharyngeal swab specimens and should not be used as a sole basis for treatment. Nasal washings and aspirates are unacceptable for Xpert Xpress SARS-CoV-2/FLU/RSV testing.  Fact Sheet for Patients: BloggerCourse.com  Fact Sheet for Healthcare Providers: SeriousBroker.it  This test is not yet approved or cleared by the [place] FDA and has been authorized for detection and/or diagnosis of SARS-CoV-2 by FDA under an Emergency Use Authorization (EUA). This EUA will remain in effect (meaning this test can be used) for the duration of the COVID-19 declaration under Section 564(b)(1) of the Act, 21 U.S.C. section 360bbb-3(b)(1), unless the authorization is terminated or revoked.  Performed at [facility], [address] [place], [place] [address]      Time coordinating discharge: Over 30 minutes  SIGNED:   Marinda Elk, MD  [facility] 03/29/2021, 9:22 AM Pager   If 7PM-7AM, please contact night-coverage [email or web address] Password [email or web address]

## 2021-03-30 DIAGNOSIS — U071 COVID-19: Secondary | ICD-10-CM | POA: Diagnosis not present

## 2021-03-30 LAB — GLUCOSE, CAPILLARY: Glucose-Capillary: 102 mg/dL — ABNORMAL HIGH (ref 70–99)

## 2021-03-30 NOTE — Progress Notes (Signed)
Physical Therapy Treatment Patient Details Name: Jose Patel MRN: [id number] DOB: Oct 11, 1933 Today's Date: 03/30/2021    History of Present Illness 87 y/o male presented to ED on 8/25 for weakness, cough and fall. Tested positive for COVID 19. PMH: HTN, CHF, DM type 2, Afib, hx of sinus node dysfunction s/p pacemaker placement, aortic insufficiency, sleep apnea, hx of stroke    PT Comments    Pt supine in bed, daughter in room on entry. Pt reluctantly agreeable to getting up with therapy, but daughter provided encouragement. Discussed plan for SNF not best solution given isolation with COVID and pt current sadness over the passing of his wife. Daughter states pt will have the needed level of support needed at home from his family. Pt is currently min A for bed mobility, min guard for transfers, and ambulation with RW. Educated on fatigue a major factor with COVID and recommended transport chair for longer distance mobility. Pt and daughter in agreement that given his current level of function that care is most appropriate and that they already have [facility] services they are comfortable. Spoke with MD in hallway and he is in agreement with D/c plan.     Follow Up Recommendations  Supervision/Assistance - 24 hour;Home health PT     Equipment Recommendations  Other (comment) (transport chair for energy conservation)       Precautions / Restrictions Precautions Precautions: Fall Precaution Comments: hx of falls Restrictions Weight Bearing Restrictions: No    Mobility  Bed Mobility Overal bed mobility: Needs Assistance Bed Mobility: Supine to Sit     Supine to sit: Min assist     General bed mobility comments: minA for trunk elevation    Transfers Overall transfer level: Needs assistance Equipment used: None Transfers: Sit to/from Stand Sit to Stand: Min guard         General transfer comment: min guard for safety, no physical assistance  required  Ambulation/Gait Ambulation/Gait assistance: Min guard Gait Distance (Feet): 40 Feet Assistive device: Rolling walker (2 wheeled) Gait Pattern/deviations: Step-through pattern;Decreased stride length;Shuffle;Narrow base of support Gait velocity: decreased Gait velocity interpretation: <1.31 ft/sec, indicative of household ambulator General Gait Details: min guard for safety. Patient unsteady and requiring HHA for support. Distance limited due to COVID precautions         Balance Overall balance assessment: Mild deficits observed, not formally tested                                          Cognition Arousal/Alertness: Awake/alert Behavior During Therapy: WFL for tasks assessed/performed;Flat affect Overall Cognitive Status: Within Functional Limits for tasks assessed                                 General Comments: daughter in room, orchestrating insurance coverage, able to provide home set up, level of support available and home equipment, pt is depressed missing his wife very much and reporting he is ready to be with her again      Exercises General Exercises - Lower Extremity Ankle Circles/Pumps: AROM;Both;20 reps;Seated Long Arc Quad: AROM;Both;10 reps Other Exercises Other Exercises: had pt put on dry socks with min assist for set up    General Comments General comments (skin integrity, edema, etc.): VSS on RA      Pertinent Vitals/Pain Pain Assessment: No/denies pain  PT Goals (current goals can now be found in the care plan section) Acute Rehab PT Goals Patient Stated Goal: to go home PT Goal Formulation: With patient Time For Goal Achievement: 04/09/21 Potential to Achieve Goals: Good Progress towards PT goals: Progressing toward goals    Frequency    Min 3X/week      PT Plan Discharge plan needs to be updated       AM-PAC PT "6 Clicks" Mobility   Outcome Measure  Help needed turning from your back  to your side while in a flat bed without using bedrails?: A Little Help needed moving from lying on your back to sitting on the side of a flat bed without using bedrails?: A Little Help needed moving to and from a bed to a chair (including a wheelchair)?: A Little Help needed standing up from a chair using your arms (e.g., wheelchair or bedside chair)?: A Little Help needed to walk in hospital room?: A Little Help needed climbing 3-5 steps with a railing? : A Little 6 Click Score: 18    End of Session Equipment Utilized During Treatment: Gait belt Activity Tolerance: Patient tolerated treatment well Patient left: in chair;with call bell/phone within reach Nurse Communication: Mobility status PT Visit Diagnosis: Unsteadiness on feet (R26.81);Muscle weakness (generalized) (M62.81);Repeated falls (R29.6);Difficulty in walking, not elsewhere classified (R26.2)     Time: 6962-9528 PT Time Calculation (min) (ACUTE ONLY): 32 min  Charges:  $Gait Training: 8-22 mins $Therapeutic Exercise: 8-22 mins                     Wanna Gully B. Beverely Risen PT, DPT Acute Rehabilitation Services Pager [phone] Office [phone]    Elon Alas Fleet 03/30/2021, 2:51 PM

## 2021-03-30 NOTE — TOC Progression Note (Addendum)
Transition of Care [facility]) - Progression Note    Patient Details  Name: Jose Patel MRN: [id number] Date of Birth: 06-Jan-1934  Transition of Care [facility]) CM/SW [place], [place] Phone Number: 03/30/2021, 11:41 AM  Clinical Narrative:      12:00 pm -UPDATE: CSW spoke with patient's daughter, [name]- explained [place] [facility] declined. She advised- she considering taking the patient home. She states patient has previously received care with [facility] services and she has spoken with their representative about patient. She states she is going to contact [employer] to confirmed if she takes the patient home and later they decide patient needs SNF - is a 3 day hospital stay required and what's their process for approval. She expressed she really does not want patient here waiting for isolation period to end and then another 3-5 days for insurance approval. [name], states she will call CSW back after contacting [employer].   9:30 am [employer]- informed CSW Ebony Hail, declined patient because covid positive.  Patient will need have to wait 10 day period since testing  positive and off isolation before he can d/c to SNF. Patient will need insurance authorization before d/c to SNF.   TOC will continue to follow and assist with discharge planning.   Thurmond Butts, MSW, LCSW Clinical Social Worker    Expected Discharge Plan: Skilled Nursing Facility Barriers to Discharge: SNF Covid, Continued Medical Work up, SNF Pending bed offer  Expected Discharge Plan and Services Expected Discharge Plan: [place] arrangements for the past 2 months: Single Family Home Expected Discharge Date: 03/28/21                                     Social Determinants of Health (SDOH) Interventions    Readmission Risk Interventions Readmission Risk Prevention Plan 05/16/2019  Transportation Screening Complete  PCP or Specialist Appt within 5-7 Days  Complete  Home Care Screening Complete  Medication Review (RN CM) Complete  Some recent data might be hidden

## 2021-03-30 NOTE — Plan of Care (Signed)
?  Problem: Activity: ?Goal: Capacity to carry out activities will improve ?Outcome: Progressing ?  ?

## 2021-03-30 NOTE — Discharge Summary (Addendum)
Physician Discharge Summary  [name] ZOX:[id number] DOB: 1934/05/27 DOA: 03/24/2021  PCP: [name], NP  Admit date: 03/24/2021 Discharge date: 03/30/2021  Admitted From: Home Disposition: Home  Recommendations for Outpatient Follow-up:  Follow up with PCP in 1-2 weeks Home isolation/quarantine according to [facility] guidelines  Home Health: PT/OT/aide/SW Equipment/Devices: Transport wheelchair  Discharge Condition: Stable CODE STATUS: Full code Diet recommendation: Heart healthy diet  History of present illness:  Jose Patel is an 87 year old male with past medical history significant for chronic combined systolic/diastolic congestive heart failure, sinus node dysfunction s/p PPM, paroxysmal atrial fibrillation s/p ablation on Xarelto, OSA, aortic insufficiency, diet-controlled diabetes mellitus, OSA who presented to [facility] ED on 8/25 with progressive weakness, falls and cough.  In the ED, CT head and C-spine unremarkable.  Chest x-ray with no acute cardiopulmonary disease process.  BNP 1400, high sensitive troponin 115.  CBC unremarkable.  COVID-19 PCR positive.  Patient was given 1 dose of IV Lasix and after discussion with patient was started on remdesivir for COVID viral infection.  TRH was consulted for further evaluation and management.   Hospital course:  COVID-19 viral infection Patient presenting to the with progressive weakness, falls and cough.  COVID-19 PCR positive.  Chest x-ray with no consolidation.  Patient has been vaccinated against COVID-19x3, last booster 07/19/2020.  Patient was not hypoxic and received 3-day course of remdesivir while hospitalized.  Generalized weakness with falls Initially during hospitalization, PT/OT recommended SNF.  Patient progressed fairly well and patient and family wish to discharge home with continued home health after discharge.  Also will discharge with a transport chair to assist with mobilization.  Acute on chronic combined  systolic/diastolic congestive heart failure Patient reported cough at time of ED presentation. Elevated BNP of 1400 on admission.  Chest x-ray with no acute cardiopulmonary disease process.  TTE 06/2020 with LVEF 40-45%.  Patient was started on IV Lasix with good urine output.  Although elevated BNP no chest x-ray findings to suggest pulm edema and cough likely related to Cova-19 viral infection as above.  IV Lasix was discontinued and resumed on his home furosemide 40 mg p.o. daily; which was recently increased by his cardiologist.  Outpatient follow-up with cardiology.  Continue metoprolol, hydralazine, losartan.  Elevated troponin Upon presentation, high since her troponin elevated at 115 which trended down during serial measurements.  Etiology likely secondary to type II demand ischemia in the setting of generalized weakness with recurrent falls in the setting of COVID-19 viral infection.  Type 2 diabetes mellitus Hemoglobin A1c 5.7, well controlled.  Diet controlled.  Cognitive impairment: Continue Namenda 10 mg p.o. twice daily  Essential hypertension: Continue metoprolol succinate 50 mg p.o. daily, losartan 100 mg p.o. daily Tolazine 50 mg p.o. twice daily  Paroxysmal atrial fibrillation Has undergone ablation.  Continue amiodarone 200 mg p.o. daily, metoprolol succinate 50 mg p.o. daily, and Xarelto.  BPH: Tamsulosin 0.4 mg p.o. daily  Discharge Diagnoses:  Principal Problem:   COVID-19 virus infection Active Problems:   Essential hypertension   Diabetes mellitus, type 2 (HCC)   Paroxysmal atrial fibrillation (HCC)   Acute on chronic combined systolic and diastolic CHF (congestive heart failure) (HCC)   Weakness    Discharge Instructions  Discharge Instructions     Call MD for:  difficulty breathing, headache or visual disturbances   Complete by: As directed    Call MD for:  extreme fatigue   Complete by: As directed    Call MD for:  persistant dizziness  or  light-headedness   Complete by: As directed    Call MD for:  persistant nausea and vomiting   Complete by: As directed    Call MD for:  severe uncontrolled pain   Complete by: As directed    Call MD for:  temperature >100.4   Complete by: As directed    Diet - low sodium heart healthy   Complete by: As directed    Increase activity slowly   Complete by: As directed    Increase activity slowly   Complete by: As directed       Allergies as of 03/30/2021       Reactions   Aricept [donepezil] Diarrhea, Nausea And Vomiting, Other (See Comments)   Cannot tolerate- bradycardia, also   Tropicamide Nausea And Vomiting   Used to dilate eyes (might be this- was switched to something this was tolerable, after this)   Adhesive [tape] Rash        Medication List     TAKE these medications    amiodarone 200 MG tablet Commonly known as: PACERONE Take 1 tablet (200 mg total) by mouth daily.   amoxicillin 500 MG capsule Commonly known as: AMOXIL Take 2,000 mg by mouth See admin instructions. Take 2,000 mg by mouth one hour prior to dental appointment   docusate sodium 100 MG capsule Commonly known as: COLACE Take 100 mg by mouth daily.   furosemide 40 MG tablet Commonly known as: LASIX Take 40 mg by mouth daily.   gabapentin 100 MG capsule Commonly known as: NEURONTIN Take 1 capsule (100 mg total) by mouth daily as needed. What changed: reasons to take this   hydrALAZINE 50 MG tablet Commonly known as: APRESOLINE Take 1 tablet (50 mg total) by mouth 2 (two) times daily.   losartan 100 MG tablet Commonly known as: COZAAR Take 1 tablet (100 mg total) by mouth daily.   melatonin 5 MG Tabs Take 5 mg by mouth at bedtime as needed (for sleep).   memantine 10 MG tablet Commonly known as: NAMENDA Take 1 tablet (10 mg total) by mouth 2 (two) times daily.   metoprolol succinate 50 MG 24 hr tablet Commonly known as: TOPROL-XL Take 1 tablet (50 mg total) by mouth daily.    multivitamin tablet Take 1 tablet by mouth daily.   nystatin cream Commonly known as: MYCOSTATIN Apply 1 application topically 2 (two) times daily. What changed:  when to take this reasons to take this   potassium chloride 10 MEQ tablet Commonly known as: KLOR-CON Take 1 tablet (10 mEq total) by mouth daily.   rivaroxaban 20 MG Tabs tablet Commonly known as: Xarelto Take 1 tablet (20 mg total) by mouth daily with supper.   tamsulosin 0.4 MG Caps capsule Commonly known as: FLOMAX Take 1 capsule (0.4 mg total) by mouth daily.   traZODone 50 MG tablet Commonly known as: DESYREL To start with 25 mg by mouth at bedtime, can increase to 50 mg   Vitamin D3 50 MCG (2000 UT) Tabs Take 2,000 Units by mouth daily with breakfast.               Durable Medical Equipment  (From admission, onward)           Start     Ordered   03/30/21 1359  For home use only DME standard manual wheelchair with seat cushion  Once       Comments: Patient suffers from generalized weakness/gait disturbance which impairs their ability to  perform daily activities like ambulating in the home.  A walker will not resolve issue with performing activities of daily living. A wheelchair will allow patient to safely perform daily activities. Patient can safely propel the wheelchair in the home or has a caregiver who can provide assistance. Length of need 6 months . Accessories: elevating leg rests (ELRs), wheel locks, extensions and anti-tippers.  Needs transport chair per PT   03/30/21 1359            Follow-up Information     [name], NP Follow up on 04/01/2021.   Specialty: Geriatric Medicine Why: @2 :15pm Contact information: [address] [place] [address] [phone]         Croitoru, Rachelle Hora, MD .   Specialty: Cardiology Contact information: [address] [place] [address] [phone]                Allergies  Allergen Reactions    Aricept [Donepezil] Diarrhea, Nausea And Vomiting and Other (See Comments)    Cannot tolerate- bradycardia, also   Tropicamide Nausea And Vomiting    Used to dilate eyes (might be this- was switched to something this was tolerable, after this)   Adhesive [Tape] Rash    Consultations: None   Procedures/Studies: DG Chest 2 View  Result Date: 03/24/2021 CLINICAL DATA:  Weakness, cough EXAM: CHEST - 2 VIEW COMPARISON:  03/16/2021 FINDINGS: Lungs are clear. No pneumothorax or pleural effusion. Cardiac size is mildly enlarged, unchanged. Left subclavian dual lead pacemaker is unchanged. Osseous structures are age-appropriate. Pulmonary vascularity is normal. IMPRESSION: No active cardiopulmonary disease. Electronically Signed   By: Helyn Numbers M.D.   On: 03/24/2021 22:03   DG Chest 2 View  Result Date: 03/17/2021 CLINICAL DATA:  Diastolic congestive heart failure, unspecified HF chronicity (HCC) Shortness of breath fatigue EXAM: CHEST - 2 VIEW COMPARISON:  07/08/2019 FINDINGS: Mild interstitial edema or infiltrates involving bases more than apices. Mild cardiomegaly. Aortic Atherosclerosis (ICD10-170.0). Stable left subclavian dual lead transvenous pacemaker. Small bilateral pleural effusions have developed.  No pneumothorax. Visualized bones unremarkable. IMPRESSION: 1. New mild bilateral interstitial edema with small effusions and cardiomegaly suggesting CHF. Electronically Signed   By: Corlis Leak M.D.   On: 03/17/2021 10:45   CT HEAD WO CONTRAST ( )  Result Date: 03/24/2021 CLINICAL DATA:  Facial trauma. Patient reports increased weakness and falls. EXAM: CT HEAD WITHOUT CONTRAST TECHNIQUE: Contiguous axial images were obtained from the base of the skull through the vertex without intravenous contrast. COMPARISON:  None. FINDINGS: Brain: No acute hemorrhage. Generalized cerebral and cerebellar atrophy. Ventricular prominence is likely due to central atrophy. There is moderate to advanced  periventricular and deep white matter hypodensity typical of chronic small vessel ischemia. Remote lacunar infarct in the left thalamus and basal ganglia. No evidence of acute ischemia. No subdural or extra-axial collection. No midline shift or mass effect. Vascular: Atherosclerosis of skullbase vasculature without hyperdense vessel or abnormal calcification. Skull: No fracture or focal lesion. Sinuses/Orbits: Mucous retention cyst in the left maxillary sinus. No fracture or acute findings. Bilateral cataract resection. No mastoid effusion. Other: No confluent scalp contusion. IMPRESSION: 1. No acute intracranial abnormality. No skull fracture. 2. Generalized atrophy and chronic small vessel ischemia. Remote lacunar infarcts in the left thalamus and basal ganglia. Electronically Signed   By: Narda Rutherford M.D.   On: 03/24/2021 22:17   CT Cervical Spine Wo Contrast  Result Date: 03/24/2021 CLINICAL DATA:  Neck trauma (Age >= 65y) Increased weakness  and falls. EXAM: CT CERVICAL SPINE WITHOUT CONTRAST TECHNIQUE: Multidetector CT imaging of the cervical spine was performed without intravenous contrast. Multiplanar CT image reconstructions were also generated. COMPARISON:  Cervical MRI 06/18/2019 FINDINGS: Alignment: Straightening of normal lordosis. No traumatic subluxation. Skull base and vertebrae: No acute fracture. Vertebral body heights are maintained. The dens and skull base are intact. Soft tissues and spinal canal: No prevertebral fluid or swelling. No visible canal hematoma. Disc levels: Diffuse endplate spurring. Disc space narrowing from C4-C5 through C6-C7. There is multilevel facet hypertrophy. Upper chest: No acute or unexpected findings. Other: None. IMPRESSION: Multilevel degenerative change throughout the cervical spine without acute fracture or subluxation. Electronically Signed   By: Narda Rutherford M.D.   On: 03/24/2021 22:20     Subjective: Patient seen examined bedside, resting  comfortably.  Daughter present.  Continues on isolation for COVID-19 viral infection.  Patient wishes to discharge home today instead of going to SNF as he believes he can manage at home with home health care.  Daughter in agreement.  No other questions or concerns at this time other than needing a transport chair on discharge.  Patient denies headache, no fever/chills/night sweats, no nausea/vomiting/diarrhea, no chest pain, no palpitations, no abdominal pain, no paresthesias.  No acute events overnight per nursing staff.  Discharge Exam: Vitals:   03/30/21 0506 03/30/21 1229  BP: (!) 132/54 139/65  Pulse: (!) 58 60  Resp: 19 18  Temp: 98 F (36.7 C) 98.7 F (37.1 C)  SpO2: 100% 95%   Vitals:   03/29/21 1158 03/29/21 2116 03/30/21 0506 03/30/21 1229  BP: 139/65 (!) 142/77 (!) 132/54 139/65  Pulse: (!) 58 60 (!) 58 60  Resp: 16 18 19 18   Temp: 98.5 F (36.9 C) 98.6 F (37 C) 98 F (36.7 C) 98.7 F (37.1 C)  TempSrc: Oral Oral Oral Oral  SpO2: 96% 97% 100% 95%  Weight:   74 kg   Height:        General: Pt is alert, awake, not in acute distress, chronically ill in appearance Cardiovascular: RRR, S1/S2 +, no rubs, no gallops Respiratory: CTA bilaterally, no wheezing, no rhonchi, on room air Abdominal: Soft, NT, ND, bowel sounds + Extremities: no edema, no cyanosis    The results of significant diagnostics from this hospitalization (including imaging, microbiology, ancillary and laboratory) are listed below for reference.     Microbiology: Recent Results (from the past 240 hour(s))  Resp Panel by RT-PCR (Flu A&B, Covid) Nasopharyngeal Swab     Status: Abnormal   Collection Time: 03/24/21 10:31 PM   Specimen: Nasopharyngeal Swab; Nasopharyngeal(NP) swabs in vial transport medium  Result Value Ref Range Status   SARS Coronavirus 2 by RT PCR POSITIVE (A) NEGATIVE Final    Comment: RESULT CALLED TO, READ BACK BY AND VERIFIED WITH: KELLIE NEAL RN AT 2323 ON 03/24/21 BY  I.SUGUT (NOTE) SARS-CoV-2 target nucleic acids are DETECTED.  The SARS-CoV-2 RNA is generally detectable in upper respiratory specimens during the acute phase of infection. Positive results are indicative of the presence of the identified virus, but do not rule out bacterial infection or co-infection with other pathogens not detected by the test. Clinical correlation with patient history and other diagnostic information is necessary to determine patient infection status. The expected result is Negative.  Fact Sheet for Patients: BloggerCourse.com  Fact Sheet for Healthcare Providers: SeriousBroker.it  This test is not yet approved or cleared by the [place] and  has been authorized for  detection and/or diagnosis of SARS-CoV-2 by FDA under an Emergency Use Authorization (EUA).  This EUA will remain in effect (meaning this  test can be used) for the duration of  the COVID-19 declaration under Section 564(b)(1) of the Act, 21 U.S.C. section 360bbb-3(b)(1), unless the authorization is terminated or revoked sooner.     Influenza A by PCR NEGATIVE NEGATIVE Final   Influenza B by PCR NEGATIVE NEGATIVE Final    Comment: (NOTE) The Xpert Xpress SARS-CoV-2/FLU/RSV plus assay is intended as an aid in the diagnosis of influenza from Nasopharyngeal swab specimens and should not be used as a sole basis for treatment. Nasal washings and aspirates are unacceptable for Xpert Xpress SARS-CoV-2/FLU/RSV testing.  Fact Sheet for Patients: BloggerCourse.com  Fact Sheet for Healthcare Providers: SeriousBroker.it  This test is not yet approved or cleared by the [place] FDA and has been authorized for detection and/or diagnosis of SARS-CoV-2 by FDA under an Emergency Use Authorization (EUA). This EUA will remain in effect (meaning this test can be used) for the duration of  the COVID-19 declaration under Section 564(b)(1) of the Act, 21 U.S.C. section 360bbb-3(b)(1), unless the authorization is terminated or revoked.  Performed at [facility], [address] [place], [place] [address]      Labs: BNP (last 3 results) Recent Labs    03/16/21 1404 03/24/21 1952  BNP 1,374* 1,468.0*   Basic Metabolic Panel: Recent Labs  Lab 03/24/21 1952 03/26/21 0403 03/27/21 0436  NA 137 138 138  K 4.3 3.3* 4.6  CL 100 104 106  CO2 25 26 26   GLUCOSE 151* 110* 101*  BUN 27* 32* 38*  CREATININE 1.24 1.30* 1.23  CALCIUM 8.9 8.5* 8.3*   Liver Function Tests: Recent Labs  Lab 03/26/21 0403 03/27/21 0436  AST 154* 231*  ALT 186* 344*  ALKPHOS 88 80  BILITOT 1.0 0.8  PROT 5.9* 5.7*  ALBUMIN 2.8* 2.7*   No results for input(s): LIPASE, AMYLASE in the last 168 hours. No results for input(s): AMMONIA in the last 168 hours. CBC: Recent Labs  Lab 03/24/21 1952 03/26/21 0403 03/27/21 0436  WBC 10.3 6.9 6.5  NEUTROABS  --  5.0 4.1  HGB 15.0 13.8 13.7  HCT 45.3 40.3 41.6  MCV 95.2 93.3 93.9  PLT 259 228 207   Cardiac Enzymes: No results for input(s): CKTOTAL, CKMB, CKMBINDEX, TROPONINI in the last 168 hours. BNP: Invalid input(s): POCBNP CBG: Recent Labs  Lab 03/29/21 0648 03/29/21 1156 03/29/21 1721 03/29/21 2112 03/30/21 0656  GLUCAP 94 121* 159* 145* 102*   D-Dimer No results for input(s): DDIMER in the last 72 hours. Hgb A1c No results for input(s): HGBA1C in the last 72 hours. Lipid Profile No results for input(s): CHOL, HDL, LDLCALC, TRIG, CHOLHDL, LDLDIRECT in the last 72 hours. Thyroid function studies No results for input(s): TSH, T4TOTAL, T3FREE, THYROIDAB in the last 72 hours.  Invalid input(s): FREET3 Anemia work up No results for input(s): VITAMINB12, FOLATE, FERRITIN, TIBC, IRON, RETICCTPCT in the last 72 hours. Urinalysis    Component Value Date/Time   COLORURINE YELLOW 03/24/2021 1954   APPEARANCEUR  CLEAR 03/24/2021 1954   LABSPEC 1.010 03/24/2021 1954   PHURINE 6.5 03/24/2021 1954   GLUCOSEU NEGATIVE 03/24/2021 1954   HGBUR NEGATIVE 03/24/2021 1954   BILIRUBINUR NEGATIVE 03/24/2021 1954   BILIRUBINUR small 08/12/2018 1250   KETONESUR NEGATIVE 03/24/2021 1954   PROTEINUR NEGATIVE 03/24/2021 1954   UROBILINOGEN negative (A) 08/12/2018 1250   NITRITE NEGATIVE  03/24/2021 1954   LEUKOCYTESUR NEGATIVE 03/24/2021 1954   Sepsis Labs Invalid input(s): PROCALCITONIN,  WBC,  LACTICIDVEN Microbiology Recent Results (from the past 240 hour(s))  Resp Panel by RT-PCR (Flu A&B, Covid) Nasopharyngeal Swab     Status: Abnormal   Collection Time: 03/24/21 10:31 PM   Specimen: Nasopharyngeal Swab; Nasopharyngeal(NP) swabs in vial transport medium  Result Value Ref Range Status   SARS Coronavirus 2 by RT PCR POSITIVE (A) NEGATIVE Final    Comment: RESULT CALLED TO, READ BACK BY AND VERIFIED WITH: KELLIE NEAL RN AT 2323 ON 03/24/21 BY I.SUGUT (NOTE) SARS-CoV-2 target nucleic acids are DETECTED.  The SARS-CoV-2 RNA is generally detectable in upper respiratory specimens during the acute phase of infection. Positive results are indicative of the presence of the identified virus, but do not rule out bacterial infection or co-infection with other pathogens not detected by the test. Clinical correlation with patient history and other diagnostic information is necessary to determine patient infection status. The expected result is Negative.  Fact Sheet for Patients: BloggerCourse.com  Fact Sheet for Healthcare Providers: SeriousBroker.it  This test is not yet approved or cleared by the [place] FDA and  has been authorized for detection and/or diagnosis of SARS-CoV-2 by FDA under an Emergency Use Authorization (EUA).  This EUA will remain in effect (meaning this  test can be used) for the duration of  the COVID-19 declaration under Section  564(b)(1) of the Act, 21 U.S.C. section 360bbb-3(b)(1), unless the authorization is terminated or revoked sooner.     Influenza A by PCR NEGATIVE NEGATIVE Final   Influenza B by PCR NEGATIVE NEGATIVE Final    Comment: (NOTE) The Xpert Xpress SARS-CoV-2/FLU/RSV plus assay is intended as an aid in the diagnosis of influenza from Nasopharyngeal swab specimens and should not be used as a sole basis for treatment. Nasal washings and aspirates are unacceptable for Xpert Xpress SARS-CoV-2/FLU/RSV testing.  Fact Sheet for Patients: BloggerCourse.com  Fact Sheet for Healthcare Providers: SeriousBroker.it  This test is not yet approved or cleared by the [place] FDA and has been authorized for detection and/or diagnosis of SARS-CoV-2 by FDA under an Emergency Use Authorization (EUA). This EUA will remain in effect (meaning this test can be used) for the duration of the COVID-19 declaration under Section 564(b)(1) of the Act, 21 U.S.C. section 360bbb-3(b)(1), unless the authorization is terminated or revoked.  Performed at [facility], [address] [place], [place] [address]      Time coordinating discharge: Over 30 minutes  SIGNED:   Alvira Philips [place], DO  [facility] 03/30/2021, 2:16 PM

## 2021-03-30 NOTE — Care Management Important Message (Signed)
Important Message  Patient Details  Name: Jose Patel MRN: [id number] Date of Birth: Nov 30, 1933   Medicare Important Message Given:  Yes     [name] 03/30/2021, 12:50 PM

## 2021-03-30 NOTE — TOC Progression Note (Signed)
Transition of Care [facility]) - Progression Note    Patient Details  Name: Lancelot Lapeyrouse MRN: [id number] Date of Birth: 08/31/1933  Transition of Care [facility]) CM/SW Contact  Zenon Mayo, RN Phone Number: 03/30/2021, 2:26 PM  Clinical Narrative:    NCM received notification from MD and CSW that patient will be going home with [facility] services and patient has asked for [facility] to [place], [place], [place]. Will also need a transportation chair. NCM made referral to [facility] with [facility], awaiting call back.   Expected Discharge Plan: [place] Barriers to Discharge: SNF Covid, Continued Medical Work up, SNF Pending bed offer  Expected Discharge Plan and Services Expected Discharge Plan: [place] arrangements for the past 2 months: Single Family Home Expected Discharge Date: 03/30/21                                     Social Determinants of Health (SDOH) Interventions    Readmission Risk Interventions Readmission Risk Prevention Plan 05/16/2019  Transportation Screening Complete  PCP or Specialist Appt within 5-7 Days Complete  Home Care Screening Complete  Medication Review (RN CM) Complete  Some recent data might be hidden

## 2021-03-30 NOTE — TOC Transition Note (Addendum)
Transition of Care [facility]) - CM/SW Discharge Note   Patient Details  Name: Jose Patel MRN: [id number] Date of Birth: January 11, 1934  Transition of Care [facility]) CM/SW Contact:  Zenon Mayo, RN Phone Number: 03/30/2021, 2:40 PM   Clinical Narrative:    NCM received notification from MD and CSW that patient will be going home with [facility] services and patient has asked for [facility] to [place], [place], [place]. Will also need a transportation chair. NCM made referral to [facility] with [facility], awaiting call back.  Per Alyse Low she is able to take referral.  Soc will begin on Friday.  NCM spoke with daughter about the transport chair , informed her that [employer] does not have any and [employer] does not have any, she states she has gotten one before from [facility] in [place] [phone].  NCM called [facility], left vm.     Final next level of care: [place] Barriers to Discharge: No Barriers Identified   Patient Goals and CMS Choice Patient states their goals for this hospitalization and ongoing recovery are:: return home with [facility] CMS Medicare.gov Compare Post Acute Care list provided to:: Patient Represenative (must comment) Choice offered to / list presented to : Adult Children  Discharge Placement                       Discharge Plan and Services                  DME Agency: Other - Comment       HH Arranged: PT, OT, Nurse's Aide, Social Work [facility] Agency: [facility] Date [place]: 03/30/21 Time [place]: [place] Representative spoke with at [place]: [place] ([place]) Interventions     Readmission Risk Interventions Readmission Risk Prevention Plan 05/16/2019  Transportation Screening Complete  PCP or Specialist Appt within 5-7 Days Complete  Home Care Screening Complete  Medication Review (RN CM) Complete  Some recent data might be hidden

## 2021-04-01 ENCOUNTER — Telehealth: Payer: Self-pay | Admitting: *Deleted

## 2021-04-01 ENCOUNTER — Ambulatory Visit: Payer: Medicare HMO | Admitting: Nurse Practitioner

## 2021-04-01 DIAGNOSIS — I110 Hypertensive heart disease with heart failure: Secondary | ICD-10-CM | POA: Diagnosis not present

## 2021-04-01 DIAGNOSIS — U071 COVID-19: Secondary | ICD-10-CM | POA: Diagnosis not present

## 2021-04-01 DIAGNOSIS — M5116 Intervertebral disc disorders with radiculopathy, lumbar region: Secondary | ICD-10-CM | POA: Diagnosis not present

## 2021-04-01 DIAGNOSIS — I483 Typical atrial flutter: Secondary | ICD-10-CM | POA: Diagnosis not present

## 2021-04-01 DIAGNOSIS — E119 Type 2 diabetes mellitus without complications: Secondary | ICD-10-CM | POA: Diagnosis not present

## 2021-04-01 DIAGNOSIS — G4730 Sleep apnea, unspecified: Secondary | ICD-10-CM | POA: Diagnosis not present

## 2021-04-01 DIAGNOSIS — I5043 Acute on chronic combined systolic (congestive) and diastolic (congestive) heart failure: Secondary | ICD-10-CM | POA: Diagnosis not present

## 2021-04-01 DIAGNOSIS — M47812 Spondylosis without myelopathy or radiculopathy, cervical region: Secondary | ICD-10-CM | POA: Diagnosis not present

## 2021-04-01 DIAGNOSIS — I480 Paroxysmal atrial fibrillation: Secondary | ICD-10-CM | POA: Diagnosis not present

## 2021-04-01 DIAGNOSIS — I080 Rheumatic disorders of both mitral and aortic valves: Secondary | ICD-10-CM | POA: Diagnosis not present

## 2021-04-01 NOTE — Telephone Encounter (Signed)
Transition Care Management Follow-up Telephone Call Date of discharge and from where: 03/30/2021 [place] How have you been since you were released from the hospital? Weak but had a lot going on. Wife just passed away March 26, 2023. Any questions or concerns? No  Items Reviewed: Did the pt receive and understand the discharge instructions provided? Yes  Medications obtained and verified? Yes  Other? No  Any new allergies since your discharge? No  Dietary orders reviewed? Yes Do you have support at home? Yes   Home Care and Equipment/Supplies: Were home health services ordered? yes If so, what is the name of the agency? [facility]. Coming for appointment today.   Has the agency set up a time to come to the patient's home? yes Were any new equipment or medical supplies ordered?  No What is the name of the medical supply agency? [facility] Were you able to get the supplies/equipment? not applicable Do you have any questions related to the use of the equipment or supplies? No  Functional Questionnaire: (I = Independent and D = Dependent) ADLs: I with Assistance  Bathing/Dressing- I  Meal Prep- D  Eating- I  Maintaining continence- I  Transferring/Ambulation- I with assistance Walker/cane  Managing Meds- I with assistance   Follow up appointments reviewed:  PCP Hospital f/u appt confirmed? Yes  Scheduled to see Dinah on 04/06/2021 Specialist Hospital f/u appt confirmed? No  Are transportation arrangements needed? No  If their condition worsens, is the pt aware to call PCP or go to the Emergency Dept.? Yes Was the patient provided with contact information for the PCP's office or ED? Yes Was to pt encouraged to call back with questions or concerns? Yes

## 2021-04-02 ENCOUNTER — Other Ambulatory Visit: Payer: Self-pay | Admitting: Cardiovascular Disease

## 2021-04-05 ENCOUNTER — Other Ambulatory Visit: Payer: Self-pay | Admitting: Cardiovascular Disease

## 2021-04-05 DIAGNOSIS — M5116 Intervertebral disc disorders with radiculopathy, lumbar region: Secondary | ICD-10-CM | POA: Diagnosis not present

## 2021-04-05 DIAGNOSIS — I080 Rheumatic disorders of both mitral and aortic valves: Secondary | ICD-10-CM | POA: Diagnosis not present

## 2021-04-05 DIAGNOSIS — U071 COVID-19: Secondary | ICD-10-CM | POA: Diagnosis not present

## 2021-04-05 DIAGNOSIS — E119 Type 2 diabetes mellitus without complications: Secondary | ICD-10-CM | POA: Diagnosis not present

## 2021-04-05 DIAGNOSIS — I483 Typical atrial flutter: Secondary | ICD-10-CM | POA: Diagnosis not present

## 2021-04-05 DIAGNOSIS — G4730 Sleep apnea, unspecified: Secondary | ICD-10-CM | POA: Diagnosis not present

## 2021-04-05 DIAGNOSIS — M47812 Spondylosis without myelopathy or radiculopathy, cervical region: Secondary | ICD-10-CM | POA: Diagnosis not present

## 2021-04-05 DIAGNOSIS — I5043 Acute on chronic combined systolic (congestive) and diastolic (congestive) heart failure: Secondary | ICD-10-CM | POA: Diagnosis not present

## 2021-04-05 DIAGNOSIS — I480 Paroxysmal atrial fibrillation: Secondary | ICD-10-CM | POA: Diagnosis not present

## 2021-04-05 DIAGNOSIS — I110 Hypertensive heart disease with heart failure: Secondary | ICD-10-CM | POA: Diagnosis not present

## 2021-04-06 ENCOUNTER — Other Ambulatory Visit: Payer: Self-pay

## 2021-04-06 ENCOUNTER — Ambulatory Visit (INDEPENDENT_AMBULATORY_CARE_PROVIDER_SITE_OTHER): Payer: Medicare HMO | Admitting: Family

## 2021-04-06 ENCOUNTER — Encounter: Payer: Self-pay | Admitting: Family

## 2021-04-06 VITALS — BP 100/60 | HR 60 | Temp 97.3°F | Ht 69.0 in | Wt 166.8 lb

## 2021-04-06 DIAGNOSIS — F320 Major depressive disorder, single episode, mild: Secondary | ICD-10-CM | POA: Diagnosis not present

## 2021-04-06 DIAGNOSIS — N401 Enlarged prostate with lower urinary tract symptoms: Secondary | ICD-10-CM | POA: Diagnosis not present

## 2021-04-06 DIAGNOSIS — R2681 Unsteadiness on feet: Secondary | ICD-10-CM

## 2021-04-06 DIAGNOSIS — I5030 Unspecified diastolic (congestive) heart failure: Secondary | ICD-10-CM | POA: Diagnosis not present

## 2021-04-06 DIAGNOSIS — U099 Post covid-19 condition, unspecified: Secondary | ICD-10-CM | POA: Diagnosis not present

## 2021-04-06 DIAGNOSIS — I10 Essential (primary) hypertension: Secondary | ICD-10-CM

## 2021-04-06 DIAGNOSIS — I480 Paroxysmal atrial fibrillation: Secondary | ICD-10-CM

## 2021-04-06 DIAGNOSIS — R69 Illness, unspecified: Secondary | ICD-10-CM | POA: Diagnosis not present

## 2021-04-06 DIAGNOSIS — F0390 Unspecified dementia without behavioral disturbance: Secondary | ICD-10-CM

## 2021-04-06 DIAGNOSIS — R531 Weakness: Secondary | ICD-10-CM | POA: Diagnosis not present

## 2021-04-06 DIAGNOSIS — R350 Frequency of micturition: Secondary | ICD-10-CM

## 2021-04-06 MED ORDER — POTASSIUM CHLORIDE CRYS ER 10 MEQ PO TBCR: 10.0000 meq | EXTENDED_RELEASE_TABLET | Freq: Every day | ORAL | 1 refills | Status: DC

## 2021-04-06 MED ORDER — FUROSEMIDE 20 MG PO TABS: 20.0000 mg | ORAL_TABLET | Freq: Every day | ORAL | 3 refills | Status: DC

## 2021-04-06 MED ORDER — BUPROPION HCL ER (SR) 100 MG PO TB12
100.0000 mg | ORAL_TABLET | Freq: Every day | ORAL | 0 refills | Status: DC
Start: 2021-04-06 — End: 2021-08-25

## 2021-04-06 MED ORDER — HYDRALAZINE HCL 25 MG PO TABS: 25.0000 mg | ORAL_TABLET | Freq: Two times a day (BID) | ORAL | 1 refills | Status: DC

## 2021-04-06 MED ORDER — TRAZODONE HCL 50 MG PO TABS: ORAL_TABLET | ORAL | 1 refills | Status: DC

## 2021-04-06 NOTE — Progress Notes (Addendum)
Provider: Deetta Siegmann FNP-C  [name], NP  Patient Care Team: [name], NP as PCP - General (Geriatric Medicine) [name], MD as PCP - Cardiology (Cardiology) Center, [facility] Endoscopy (Gastroenterology) [name], MD as Consulting Physician  Extended Emergency Contact Information Primary Emergency Contact: Henderson Cloud Address: [address]          [place], [place] [address] Johnnette Litter of [place] Phone: [phone] Mobile Phone: [phone] Relation: Daughter Secondary Emergency Contact: Cristoval, Teall II Mobile Phone: [phone] Relation: Son  Code Status:  DNR Goals of care: Advanced Directive information Advanced Directives 04/06/2021  Does Patient Have a Medical Advance Directive? Yes  Type of Paramedic of [place];Living will  Does patient want to make changes to medical advance directive? No - Patient declined  Copy of [place] in Chart? Yes - validated most recent copy scanned in chart (See row information)  Would patient like information on creating a medical advance directive? -     Chief Complaint  Patient presents with   Transitions Of Care    Patient follow ing up on CHF. Patient recently in hospital. Patient still very weak. Would like to discuss or for transport wheelchair. Concerns about Lasix   Health Maintenance    HPI:  Pt is a 87 y.o. male seen today for an acute visit for Transition of care post hospital admission from 03/24/2021 - 03/30/2021 for acute on chronic combined systolic/diastolic congestive heart failure exacerbation., generalized weakness and falls.He had CT of the Head and C-spine which was unremarkable.Chest x-ray was normal.Urine analysis was normal.CBC show ed no leukocytosis.His COVID-19 PCR test was positive.He was treated with 3 days of remdesivir.His BNP was 1400 was treated with I.V Lasix.High sensitive troponin 115 thought due to oxygen  demand.troponin level trended down. PT/OT recommended SNF but daughter states did not want him waiting another couple of days at the hospital while waiting for a SNF bed so she preferred to taking home. Has a medical history cpmbined systolic and diastolic congestive Heart failure,Aterial fibrillation post ablation on Xarelto,controlled Type 2 DM,OSA,sinus node dysfunction status post PPM,hx of CVA, Aortic insufficiency, BPH,dementia without behavioral disturbances among others.  He is here with daughter who is the primary care giver. States mother recent passed away prior to patient's admission to the hospital.  Not eating much and not getting around. Has not started on Physical therapy but daughter states 44 and Occupation Therapy has seen him just waiting for Physical Therapist.  Request Transport wheelchair to assist with transporting due to generalized weakness unable to use walker or cane to prevent fall episode.Patient can no longer use a cane or walker.  Daughter concerned that he his giving up and depressed.Patient admitted to feeling depressed.willing to started on an antidepressant " anything that can help". Hospice chaplain has been calling daughter but was at work.she will return calls to get chaplain to see patient.  States cough has improved.still has loss of taste but improving.denies any fever or chills.  Medication reviewed and reconciled.  Past Medical History:  Diagnosis Date   A-fib [facility])    Anticoagulated on Coumadin    Per records from [facility]    Aortic regurgitation    Aortic stenosis    CHF (congestive heart failure) ([place])    Coronary artery disease    COVID    CVA (cerebral vascular accident) ([place])    hx of CVA noted on CT from 02/19/18   Diabetes  mellitus, type 2 ([place])    Gastro-esophageal reflux disease without esophagitis    Per records from previous provider, [facility]   Heart attack [facility])    History of CT scan of head  02/19/2018   Per records from previous provider, [facility]. Chronic changes small vessel disease   History of ECG    03/26/14- Sinus Tachycardia @ 107 bmp, QRS 78 msec, QT 298 msec, QTc 361 msec. 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 [facility])    Per records from previous provider, [facility]    MI (myocardial infarction) [facility])    Per records from [place]    Mitral regurgitation    Mitral stenosis    Other intervertebral disc degeneration, lumbar region    Per records from previous provider, [facility]   Radiculopathy, lumbar region    Per records from previous provider, [facility]   Sinus bradycardia    Per records from [place]    Sleep apnea    Transient ischemic attack    10 to 12 years ago   Typical atrial flutter [facility])    Per records from [place]    Past Surgical History:  Procedure Laterality Date   [place] N/A 12/26/2018   Procedure: CARDIOVERSION;  Surgeon: Pixie Casino, MD;  Location: [facility];  Service: Cardiovascular;  Laterality: N/A;   CARDIOVERSION N/A 05/14/2019   Procedure: CARDIOVERSION;  Surgeon: [name], MD;  Location: [facility] ENDOSCOPY;  Service: Cardiovascular;  Laterality: N/A;   CATARACT EXTRACTION     Debridement to right arm with MRSA infection     4 years ago   GALLBLADDER SURGERY     Per records from [place] 07/07/2019   Procedure: PACEMAKER IMPLANT;  Surgeon: [name], MD;  Location: [facility] CV LAB;  Service: Cardiovascular;  Laterality: N/A;   TEE WITHOUT CARDIOVERSION N/A 12/26/2018   Procedure: TRANSESOPHAGEAL ECHOCARDIOGRAM  (TEE);  Surgeon: Pixie Casino, MD;  Location: [facility] ENDOSCOPY;  Service: Cardiovascular;  Laterality: N/A;    Allergies  Allergen Reactions   Aricept [Donepezil] Diarrhea, Nausea And Vomiting and Other (See Comments)    Cannot tolerate- bradycardia, also   Tropicamide Nausea And Vomiting    Used to dilate eyes (might be this- was switched to something this was tolerable, after this)   Adhesive [Tape] Rash    Outpatient Encounter Medications as of 04/06/2021  Medication Sig   amiodarone (PACERONE) 200 MG tablet Take 1 tablet (200 mg total) by mouth daily.   amoxicillin (AMOXIL) 500 MG capsule Take 2,000 mg by mouth See admin instructions. Take 2,000 mg by mouth one hour prior to dental appointment   Cholecalciferol (VITAMIN D3) 50 MCG (2000 UT) TABS Take 2,000 Units by mouth daily with breakfast.   docusate sodium (COLACE) 100 MG capsule Take 100 mg by mouth daily.   gabapentin (NEURONTIN) 100 MG capsule Take 1 capsule (100 mg total) by mouth daily as needed. (Patient taking differently: Take 100 mg by mouth daily as needed (nerve pain).)   hydrALAZINE (APRESOLINE) 50 MG tablet Take 1 tablet (50 mg total) by mouth 2 (two)  times daily.   losartan (COZAAR) 100 MG tablet Take 1 tablet (100 mg total) by mouth daily.   Melatonin 5 MG TABS Take 5 mg by mouth at bedtime as needed (for sleep).    memantine (NAMENDA) 10 MG tablet Take 1 tablet (10 mg total) by mouth 2 (two) times daily.   metoprolol succinate (TOPROL-XL) 50 MG 24 hr tablet Take 1 tablet (50 mg total) by mouth daily.   Multiple Vitamin (MULTIVITAMIN) tablet Take 1 tablet by mouth daily.   potassium chloride (KLOR-CON) 10 MEQ tablet Take 1 tablet (10 mEq total) by mouth daily.   rivaroxaban (XARELTO) 20 MG TABS tablet Take 1 tablet (20 mg total) by mouth daily with supper.   tamsulosin (FLOMAX) 0.4 MG CAPS capsule Take 1 capsule (0.4 mg total) by mouth daily.   traZODone (DESYREL) 50 MG tablet To start with 25 mg by mouth at bedtime,  can increase to 50 mg   furosemide (LASIX) 40 MG tablet Take 40 mg by mouth daily.   [DISCONTINUED] nystatin cream (MYCOSTATIN) Apply 1 application topically 2 (two) times daily. (Patient taking differently: Apply 1 application topically 2 (two) times daily as needed for dry skin (rash).)   Facility-Administered Encounter Medications as of 04/06/2021  Medication   sodium chloride flush (NS) 0.9 % injection 3 mL    Review of Systems  Constitutional:  Positive for appetite change. Negative for chills, fever and unexpected weight change.       Generalized weakness   HENT:  Negative for congestion, dental problem, ear discharge, ear pain, facial swelling, hearing loss, nosebleeds, postnasal drip, rhinorrhea, sinus pressure, sinus pain, sneezing, sore throat, tinnitus and trouble swallowing.        Loss of taste improving   Eyes:  Negative for pain, discharge, redness, itching and visual disturbance.  Respiratory:  Negative for cough, chest tightness, shortness of breath and wheezing.   Cardiovascular:  Negative for chest pain, palpitations and leg swelling.  Gastrointestinal:  Positive for constipation. Negative for abdominal distention, abdominal pain, blood in stool, diarrhea, nausea and vomiting.       On colace but not eating well   Endocrine: Negative for cold intolerance, heat intolerance, polydipsia, polyphagia and polyuria.  Genitourinary:  Negative for difficulty urinating, dysuria, flank pain and urgency.       Hx BPH   Musculoskeletal:  Negative for arthralgias, back pain, gait problem, joint swelling, myalgias, neck pain and neck stiffness.  Skin:  Negative for color change, pallor, rash and wound.  Neurological:  Negative for dizziness, syncope, speech difficulty, weakness, light-headedness, numbness and headaches.  Hematological:  Does not bruise/bleed easily.  Psychiatric/Behavioral:  Negative for agitation, behavioral problems, confusion, hallucinations, self-injury, sleep  disturbance and suicidal ideas. The patient is not nervous/anxious.        Feeling depressed    Immunization History  Administered Date(s) Administered   Fluad Quad(high Dose 65+) 04/18/2019   Influenza, High Dose Seasonal PF 05/08/2018, 07/19/2020   Influenza-Unspecified 05/15/2017   PFIZER(Purple Top)SARS-COV-2 Vaccination 08/14/2019, 09/04/2019, 07/19/2020   Pneumococcal Polysaccharide-23 07/21/2019   Tdap 07/31/2013   Zoster Recombinat (Shingrix) 08/12/2018   Pertinent  Health Maintenance Due  Topic Date Due   OPHTHALMOLOGY EXAM  Never done   FOOT EXAM  07/17/2020   PNA vac Low Risk Adult (2 of 2 - PCV13) 07/20/2020   INFLUENZA VACCINE  02/28/2021   HEMOGLOBIN A1C  08/28/2021   Fall Risk  04/06/2021 03/16/2021 02/25/2021 08/23/2020 04/08/2020  Falls in the past year? 1  _0 Number falls in past yr: _1 0  Injury with Fall? 0 0 0 0 0  Risk for fall due to : History of fall(s) History of fall(s) History of fall(s) History of fall(s) -  Follow up Falls evaluation completed Falls evaluation completed Falls evaluation completed - -   Functional Status Survey:    Vitals:   04/06/21 1507  BP: 100/60  Pulse: 60  Temp: (!) 97.3 F (36.3 C)  TempSrc: Temporal  SpO2: 97%  Weight: 166 lb 12.8 oz (75.7 kg)  Height: _2  (1.753 m)   Body mass index is 24.63 kg/m. Physical Exam Vitals reviewed.  Constitutional:      General: He is not in acute distress.    Appearance: Normal appearance. He is normal weight. He is not ill-appearing or diaphoretic.  HENT:     Head: Normocephalic.     Nose: Nose normal. No congestion or rhinorrhea.     Mouth/Throat:     Mouth: Mucous membranes are moist.     Pharynx: Oropharynx is clear. No oropharyngeal exudate or posterior oropharyngeal erythema.  Eyes:     General: No scleral icterus.       Right eye: No discharge.        Left eye: No discharge.     Extraocular Movements: Extraocular movements intact.     Conjunctiva/sclera:  Conjunctivae normal.     Pupils: Pupils are equal, round, and reactive to light.  Neck:     Vascular: No carotid bruit.  Cardiovascular:     Rate and Rhythm: Normal rate and regular rhythm.     Pulses: Normal pulses.     Heart sounds: Normal heart sounds. No murmur heard.   No friction rub. No gallop.  Pulmonary:     Effort: Pulmonary effort is normal. No respiratory distress.     Breath sounds: Normal breath sounds. No wheezing, rhonchi or rales.  Chest:     Chest wall: No tenderness.  Abdominal:     General: Bowel sounds are normal. There is no distension.     Palpations: Abdomen is soft. There is no mass.     Tenderness: There is no abdominal tenderness. There is no right CVA tenderness, left CVA tenderness, guarding or rebound.  Musculoskeletal:        General: No swelling or tenderness. Normal range of motion.     Cervical back: Normal range of motion. No rigidity or tenderness.     Right lower leg: No edema.     Left lower leg: No edema.     Comments: On transport wheelchair during visit   Lymphadenopathy:     Cervical: No cervical adenopathy.  Skin:    General: Skin is warm and dry.     Coloration: Skin is not pale.     Findings: No bruising, erythema, lesion or rash.  Neurological:     Mental Status: He is alert and oriented to person, place, and time.     Cranial Nerves: No cranial nerve deficit.     Sensory: No sensory deficit.     Motor: No weakness.     Coordination: Coordination normal.     Gait: Gait normal.  Psychiatric:        Mood and Affect: Mood is depressed.        Speech: Speech normal.        Behavior: Behavior normal.        Thought Content: Thought content normal.  Judgment: Judgment normal.    Labs reviewed: Recent Labs    03/24/21 1952 03/26/21 0403 03/27/21 0436  NA 137 138 138  K 4.3 3.3* 4.6  CL 100 104 106  CO2 _0 GLUCOSE 151* 110* 101*  BUN 27* 32* 38*  CREATININE 1.24 1.30* 1.23  CALCIUM 8.9 8.5* 8.3*   Recent  Labs    11/01/20 1049 02/25/21 1521 03/26/21 0403 03/27/21 0436  AST 31 30 154* 231*  ALT 51* 40 186* 344*  ALKPHOS 103  --  88 80  BILITOT 0.6 0.7 1.0 0.8  PROT 6.7 6.1 5.9* 5.7*  ALBUMIN 3.7  --  2.8* 2.7*   Recent Labs    03/16/21 1402 03/24/21 1952 03/26/21 0403 03/27/21 0436  WBC 6.6 10.3 6.9 6.5  NEUTROABS 4,198  --  5.0 4.1  HGB 13.1* 15.0 13.8 13.7  HCT 40.3 45.3 40.3 41.6  MCV 96.2 95.2 93.3 93.9  PLT 234 259 228 207   Lab Results  Component Value Date   TSH 1.852 03/25/2021   Lab Results  Component Value Date   HGBA1C 5.7 (H) 02/25/2021   Lab Results  Component Value Date   CHOL 179 11/01/2020   HDL 33 (L) 11/01/2020   LDLCALC 124 (H) 11/01/2020   TRIG 122 11/01/2020   CHOLHDL 5.4 (H) 11/01/2020    Significant Diagnostic Results in last 30 days:  DG Chest 2 View  Result Date: 03/24/2021 CLINICAL DATA:  Weakness, cough EXAM: CHEST - 2 VIEW COMPARISON:  03/16/2021 FINDINGS: Lungs are clear. No pneumothorax or pleural effusion. Cardiac size is mildly enlarged, unchanged. Left subclavian dual lead pacemaker is unchanged. Osseous structures are age-appropriate. Pulmonary vascularity is normal. IMPRESSION: No active cardiopulmonary disease. Electronically Signed   By: Fidela Salisbury M.D.   On: 03/24/2021 22:03   DG Chest 2 View  Result Date: 03/17/2021 CLINICAL DATA:  Diastolic congestive heart failure, unspecified HF chronicity (HCC) Shortness of breath fatigue EXAM: CHEST - 2 VIEW COMPARISON:  07/08/2019 FINDINGS: Mild interstitial edema or infiltrates involving bases more than apices. Mild cardiomegaly. Aortic Atherosclerosis (ICD10-170.0). Stable left subclavian dual lead transvenous pacemaker. Small bilateral pleural effusions have developed.  No pneumothorax. Visualized bones unremarkable. IMPRESSION: 1. New mild bilateral interstitial edema with small effusions and cardiomegaly suggesting CHF. Electronically Signed   By: Lucrezia Europe M.D.   On: 03/17/2021  10:45   CT HEAD WO CONTRAST (5MM)  Result Date: 03/24/2021 CLINICAL DATA:  Facial trauma. Patient reports increased weakness and falls. EXAM: CT HEAD WITHOUT CONTRAST TECHNIQUE: Contiguous axial images were obtained from the base of the skull through the vertex without intravenous contrast. COMPARISON:  None. FINDINGS: Brain: No acute hemorrhage. Generalized cerebral and cerebellar atrophy. Ventricular prominence is likely due to central atrophy. There is moderate to advanced periventricular and deep white matter hypodensity typical of chronic small vessel ischemia. Remote lacunar infarct in the left thalamus and basal ganglia. No evidence of acute ischemia. No subdural or extra-axial collection. No midline shift or mass effect. Vascular: Atherosclerosis of skullbase vasculature without hyperdense vessel or abnormal calcification. Skull: No fracture or focal lesion. Sinuses/Orbits: Mucous retention cyst in the left maxillary sinus. No fracture or acute findings. Bilateral cataract resection. No mastoid effusion. Other: No confluent scalp contusion. IMPRESSION: 1. No acute intracranial abnormality. No skull fracture. 2. Generalized atrophy and chronic small vessel ischemia. Remote lacunar infarcts in the left thalamus and basal ganglia. Electronically Signed   By: Keith Rake M.D.   On: 03/24/2021  22:17   CT Cervical Spine Wo Contrast  Result Date: 03/24/2021 CLINICAL DATA:  Neck trauma (Age >= 65y) Increased weakness and falls. EXAM: CT CERVICAL SPINE WITHOUT CONTRAST TECHNIQUE: Multidetector CT imaging of the cervical spine was performed without intravenous contrast. Multiplanar CT image reconstructions were also generated. COMPARISON:  Cervical MRI 06/18/2019 FINDINGS: Alignment: Straightening of normal lordosis. No traumatic subluxation. Skull base and vertebrae: No acute fracture. Vertebral body heights are maintained. The dens and skull base are intact. Soft tissues and spinal canal: No  prevertebral fluid or swelling. No visible canal hematoma. Disc levels: Diffuse endplate spurring. Disc space narrowing from C4-C5 through C6-C7. There is multilevel facet hypertrophy. Upper chest: No acute or unexpected findings. Other: None. IMPRESSION: Multilevel degenerative change throughout the cervical spine without acute fracture or subluxation. Electronically Signed   By: Keith Rake M.D.   On: 03/24/2021 22:20    Assessment/Plan  1. Depression, major, single episode, mild (HCC) Symptoms has worsen due to recent wife's death and his condition too status post hospital admission for COVID-19.willing to start on antidepressant SSRI not an option for him due to current use of Amiodarone to prevent QT- prolongation.  - start on Wellbutrin as below.SE discussed.  - Notify provider if symptoms worsen.  - traZODone (DESYREL) 50 MG tablet; To start with 25 mg by mouth at bedtime, can increase to 50 mg  Dispense: 90 tablet; Refill: 1 - buPROPion ER (WELLBUTRIN SR) 100 MG 12 hr tablet; Take 1 tablet (100 mg total) by mouth daily.  Dispense: 30 tablet; Refill: 0 - follow up with Hospice Chaplain for counseling therapy. Daughter will make appointment.   2. Diastolic congestive heart failure, unspecified HF chronicity (HCC) No fluid overload  - continue on Furosemide  - potassium chloride (KLOR-CON) 10 MEQ tablet; Take 1 tablet (10 mEq total) by mouth daily.  Dispense: 90 tablet; Refill: 1 - furosemide (LASIX) 20 MG tablet; Take 1 tablet (20 mg total) by mouth daily.  Dispense: 90 tablet; Refill: 3 - CBC with Differential/Platelet - CMP with eGFR([facility]) - Brain Natriuretic Peptide - For home use only DME Other see comment  3. Essential hypertension B/p stable  - continue losartan,Hydralazine ,metoprolol and furosemide - hydrALAZINE (APRESOLINE) 25 MG tablet; Take 1 tablet (25 mg total) by mouth 2 (two) times daily.  Dispense: 180 tablet; Refill: 1  4. Post covid-19 condition,  unspecified Status post hospital admission  Cough has improved  5. Dementia without behavioral disturbance, unspecified dementia type ([place]) No new behavioral issues reported. Continue with supportive care  - continue on Namenda   6. Paroxysmal atrial fibrillation (HCC) Continue on amiodarone,metoprolol and Xarelto  - For home use only DME Other see comment  7. Generalized weakness Status post COVID-19 infection admitted at the Hospital  - For home use only DME Other see comment: PT for ROM,exercise,gait stability and muscle strengthening.   8. Benign prostatic hyperplasia with urinary frequency Continue on Tamsulosin   9.Unsteady gait  High risk for falls can no longer use cane or walker requires transport wheelchair.Family available to assist with wheelchair.    Family/ staff Communication: Reviewed plan of care with patient and daughter verbalized understanding.   Labs/tests ordered:  - CBC with Differential/Platelet - CMP with eGFR([facility]) - Brain Natriuretic Peptide  Next Appointment: 1 month for depression evaluation.   Sandrea Hughs, NP

## 2021-04-07 LAB — CBC WITH DIFFERENTIAL/PLATELET
Absolute Monocytes: 399 cells/uL (ref 200–950)
Basophils Absolute: 7 cells/uL (ref 0–200)
Basophils Relative: 0.1 %
Eosinophils Absolute: 70 cells/uL (ref 15–500)
Eosinophils Relative: 1 %
HCT: 47 % (ref 38.5–50.0)
Hemoglobin: 14.8 g/dL (ref 13.2–17.1)
Lymphs Abs: 1680 cells/uL (ref 850–3900)
MCH: 30.6 pg (ref 27.0–33.0)
MCHC: 31.5 g/dL — ABNORMAL LOW (ref 32.0–36.0)
MCV: 97.1 fL (ref 80.0–100.0)
MPV: 11.2 fL (ref 7.5–12.5)
Monocytes Relative: 5.7 %
Neutro Abs: 4844 cells/uL (ref 1500–7800)
Neutrophils Relative %: 69.2 %
Platelets: 227 10*3/uL (ref 140–400)
RBC: 4.84 10*6/uL (ref 4.20–5.80)
RDW: 12.4 % (ref 11.0–15.0)
Total Lymphocyte: 24 %
WBC: 7 10*3/uL (ref 3.8–10.8)

## 2021-04-07 LAB — COMPLETE METABOLIC PANEL WITH GFR
AG Ratio: 1.2 (calc) (ref 1.0–2.5)
ALT: 125 U/L — ABNORMAL HIGH (ref 9–46)
AST: 57 U/L — ABNORMAL HIGH (ref 10–35)
Albumin: 3.5 g/dL — ABNORMAL LOW (ref 3.6–5.1)
Alkaline phosphatase (APISO): 101 U/L (ref 35–144)
BUN/Creatinine Ratio: 23 (calc) — ABNORMAL HIGH (ref 6–22)
BUN: 35 mg/dL — ABNORMAL HIGH (ref 7–25)
CO2: 28 mmol/L (ref 20–32)
Calcium: 8.9 mg/dL (ref 8.6–10.3)
Chloride: 103 mmol/L (ref 98–110)
Creat: 1.49 mg/dL — ABNORMAL HIGH (ref 0.70–1.22)
Globulin: 2.9 g/dL (calc) (ref 1.9–3.7)
Glucose, Bld: 196 mg/dL — ABNORMAL HIGH (ref 65–139)
Potassium: 4.7 mmol/L (ref 3.5–5.3)
Sodium: 140 mmol/L (ref 135–146)
Total Bilirubin: 0.8 mg/dL (ref 0.2–1.2)
Total Protein: 6.4 g/dL (ref 6.1–8.1)
eGFR: 46 mL/min/{1.73_m2} — ABNORMAL LOW (ref >=60)

## 2021-04-07 LAB — BRAIN NATRIURETIC PEPTIDE: Brain Natriuretic Peptide: 306 pg/mL — ABNORMAL HIGH (ref <100)

## 2021-04-08 DIAGNOSIS — I110 Hypertensive heart disease with heart failure: Secondary | ICD-10-CM | POA: Diagnosis not present

## 2021-04-08 DIAGNOSIS — M5116 Intervertebral disc disorders with radiculopathy, lumbar region: Secondary | ICD-10-CM | POA: Diagnosis not present

## 2021-04-08 DIAGNOSIS — G4730 Sleep apnea, unspecified: Secondary | ICD-10-CM | POA: Diagnosis not present

## 2021-04-08 DIAGNOSIS — I480 Paroxysmal atrial fibrillation: Secondary | ICD-10-CM | POA: Diagnosis not present

## 2021-04-08 DIAGNOSIS — I5043 Acute on chronic combined systolic (congestive) and diastolic (congestive) heart failure: Secondary | ICD-10-CM | POA: Diagnosis not present

## 2021-04-08 DIAGNOSIS — E119 Type 2 diabetes mellitus without complications: Secondary | ICD-10-CM | POA: Diagnosis not present

## 2021-04-08 DIAGNOSIS — M47812 Spondylosis without myelopathy or radiculopathy, cervical region: Secondary | ICD-10-CM | POA: Diagnosis not present

## 2021-04-08 DIAGNOSIS — I080 Rheumatic disorders of both mitral and aortic valves: Secondary | ICD-10-CM | POA: Diagnosis not present

## 2021-04-08 DIAGNOSIS — U071 COVID-19: Secondary | ICD-10-CM | POA: Diagnosis not present

## 2021-04-08 DIAGNOSIS — I483 Typical atrial flutter: Secondary | ICD-10-CM | POA: Diagnosis not present

## 2021-04-09 ENCOUNTER — Other Ambulatory Visit: Payer: Self-pay | Admitting: Nurse Practitioner

## 2021-04-09 DIAGNOSIS — I5030 Unspecified diastolic (congestive) heart failure: Secondary | ICD-10-CM

## 2021-04-11 ENCOUNTER — Telehealth: Payer: Self-pay | Admitting: *Deleted

## 2021-04-11 DIAGNOSIS — F320 Major depressive disorder, single episode, mild: Secondary | ICD-10-CM

## 2021-04-11 MED ORDER — TRAZODONE HCL 50 MG PO TABS: ORAL_TABLET | ORAL | 1 refills | Status: DC

## 2021-04-11 NOTE — Telephone Encounter (Signed)
Received fax from [facility] needing Clarification on Rx written on 04/06/21 for Trazodone:  "How Long on starting dose before Increase?"  Please Advise.    OV Note Dated 04/06/2021: Assessment/Plan   1. Depression, major, single episode, mild (HCC) Symptoms has worsen due to recent wife's death and his condition too status post hospital admission for COVID-19.willing to start on antidepressant SSRI not an option for him due to current use of Amiodarone to prevent QT- prolongation.  - start on Wellbutrin as below.SE discussed.  - Notify provider if symptoms worsen.  - traZODone (DESYREL) 50 MG tablet; To start with 25 mg by mouth at bedtime, can increase to 50 mg  Dispense: 90 tablet; Refill: 1 - buPROPion ER (WELLBUTRIN SR) 100 MG 12 hr tablet; Take 1 tablet (100 mg total) by mouth daily.  Dispense: 30 tablet; Refill: 0 - follow up with Hospice Chaplain for counseling therapy. Daughter will make appointment.

## 2021-04-11 NOTE — Telephone Encounter (Signed)
Trazodone 25 mg tablet take 1-2 tablet( 25 -50 mg ) by mouth daily at bedtime  for sleep.

## 2021-04-11 NOTE — Telephone Encounter (Signed)
Pended Rx and sent to [facility] for approval.  There is no Trazodone '25mg'$  to choose from in medication list.

## 2021-04-11 NOTE — Telephone Encounter (Signed)
Signed.

## 2021-04-12 ENCOUNTER — Telehealth: Payer: Self-pay | Admitting: *Deleted

## 2021-04-12 DIAGNOSIS — I483 Typical atrial flutter: Secondary | ICD-10-CM | POA: Diagnosis not present

## 2021-04-12 DIAGNOSIS — I480 Paroxysmal atrial fibrillation: Secondary | ICD-10-CM | POA: Diagnosis not present

## 2021-04-12 DIAGNOSIS — I5043 Acute on chronic combined systolic (congestive) and diastolic (congestive) heart failure: Secondary | ICD-10-CM | POA: Diagnosis not present

## 2021-04-12 DIAGNOSIS — U071 COVID-19: Secondary | ICD-10-CM | POA: Diagnosis not present

## 2021-04-12 DIAGNOSIS — I080 Rheumatic disorders of both mitral and aortic valves: Secondary | ICD-10-CM | POA: Diagnosis not present

## 2021-04-12 DIAGNOSIS — E119 Type 2 diabetes mellitus without complications: Secondary | ICD-10-CM | POA: Diagnosis not present

## 2021-04-12 DIAGNOSIS — M47812 Spondylosis without myelopathy or radiculopathy, cervical region: Secondary | ICD-10-CM | POA: Diagnosis not present

## 2021-04-12 DIAGNOSIS — M5116 Intervertebral disc disorders with radiculopathy, lumbar region: Secondary | ICD-10-CM | POA: Diagnosis not present

## 2021-04-12 DIAGNOSIS — I110 Hypertensive heart disease with heart failure: Secondary | ICD-10-CM | POA: Diagnosis not present

## 2021-04-12 DIAGNOSIS — G4730 Sleep apnea, unspecified: Secondary | ICD-10-CM | POA: Diagnosis not present

## 2021-04-12 NOTE — Telephone Encounter (Signed)
Patient daughter, Jose Patel, called and stated that [name] needed the last OV notes to support the [employer].  Requested to be faxed to [facility] at Fax: [phone]

## 2021-04-14 DIAGNOSIS — I480 Paroxysmal atrial fibrillation: Secondary | ICD-10-CM | POA: Diagnosis not present

## 2021-04-14 DIAGNOSIS — U071 COVID-19: Secondary | ICD-10-CM | POA: Diagnosis not present

## 2021-04-14 DIAGNOSIS — G4730 Sleep apnea, unspecified: Secondary | ICD-10-CM | POA: Diagnosis not present

## 2021-04-14 DIAGNOSIS — I5043 Acute on chronic combined systolic (congestive) and diastolic (congestive) heart failure: Secondary | ICD-10-CM | POA: Diagnosis not present

## 2021-04-14 DIAGNOSIS — M47812 Spondylosis without myelopathy or radiculopathy, cervical region: Secondary | ICD-10-CM | POA: Diagnosis not present

## 2021-04-14 DIAGNOSIS — I483 Typical atrial flutter: Secondary | ICD-10-CM | POA: Diagnosis not present

## 2021-04-14 DIAGNOSIS — I110 Hypertensive heart disease with heart failure: Secondary | ICD-10-CM | POA: Diagnosis not present

## 2021-04-14 DIAGNOSIS — I080 Rheumatic disorders of both mitral and aortic valves: Secondary | ICD-10-CM | POA: Diagnosis not present

## 2021-04-14 DIAGNOSIS — E119 Type 2 diabetes mellitus without complications: Secondary | ICD-10-CM | POA: Diagnosis not present

## 2021-04-14 DIAGNOSIS — M5116 Intervertebral disc disorders with radiculopathy, lumbar region: Secondary | ICD-10-CM | POA: Diagnosis not present

## 2021-04-15 ENCOUNTER — Encounter: Payer: Self-pay | Admitting: Nurse Practitioner

## 2021-04-18 ENCOUNTER — Ambulatory Visit (INDEPENDENT_AMBULATORY_CARE_PROVIDER_SITE_OTHER): Payer: Medicare HMO

## 2021-04-18 DIAGNOSIS — G4730 Sleep apnea, unspecified: Secondary | ICD-10-CM | POA: Diagnosis not present

## 2021-04-18 DIAGNOSIS — I480 Paroxysmal atrial fibrillation: Secondary | ICD-10-CM | POA: Diagnosis not present

## 2021-04-18 DIAGNOSIS — I5043 Acute on chronic combined systolic (congestive) and diastolic (congestive) heart failure: Secondary | ICD-10-CM | POA: Diagnosis not present

## 2021-04-18 DIAGNOSIS — I495 Sick sinus syndrome: Secondary | ICD-10-CM | POA: Diagnosis not present

## 2021-04-18 DIAGNOSIS — U071 COVID-19: Secondary | ICD-10-CM | POA: Diagnosis not present

## 2021-04-18 DIAGNOSIS — M47812 Spondylosis without myelopathy or radiculopathy, cervical region: Secondary | ICD-10-CM | POA: Diagnosis not present

## 2021-04-18 DIAGNOSIS — M5116 Intervertebral disc disorders with radiculopathy, lumbar region: Secondary | ICD-10-CM | POA: Diagnosis not present

## 2021-04-18 DIAGNOSIS — I483 Typical atrial flutter: Secondary | ICD-10-CM | POA: Diagnosis not present

## 2021-04-18 DIAGNOSIS — I110 Hypertensive heart disease with heart failure: Secondary | ICD-10-CM | POA: Diagnosis not present

## 2021-04-18 DIAGNOSIS — I080 Rheumatic disorders of both mitral and aortic valves: Secondary | ICD-10-CM | POA: Diagnosis not present

## 2021-04-18 DIAGNOSIS — E119 Type 2 diabetes mellitus without complications: Secondary | ICD-10-CM | POA: Diagnosis not present

## 2021-04-19 LAB — CUP PACEART REMOTE DEVICE CHECK
Battery Remaining Longevity: 143 mo
Battery Voltage: 3.02 V
Brady Statistic AP VP Percent: 0.05 %
Brady Statistic AP VS Percent: 99.89 %
Brady Statistic AS VP Percent: 0 %
Brady Statistic AS VS Percent: 0.06 %
Brady Statistic RA Percent Paced: 99.93 %
Brady Statistic RV Percent Paced: 0.05 %
Date Time Interrogation Session: 20220919103131
Implantable Lead Implant Date: 20201207
Implantable Lead Implant Date: 20201207
Implantable Lead Location: 753859
Implantable Lead Location: 753860
Implantable Lead Model: 5076
Implantable Lead Model: 5076
Implantable Pulse Generator Implant Date: 20201207
Lead Channel Impedance Value: 380 Ohm
Lead Channel Impedance Value: 418 Ohm
Lead Channel Impedance Value: 513 Ohm
Lead Channel Impedance Value: 513 Ohm
Lead Channel Pacing Threshold Amplitude: 0.625 V
Lead Channel Pacing Threshold Amplitude: 1 V
Lead Channel Pacing Threshold Pulse Width: 0.4 ms
Lead Channel Pacing Threshold Pulse Width: 0.4 ms
Lead Channel Sensing Intrinsic Amplitude: 2.5 mV
Lead Channel Sensing Intrinsic Amplitude: 2.5 mV
Lead Channel Sensing Intrinsic Amplitude: 31.625 mV
Lead Channel Sensing Intrinsic Amplitude: 31.625 mV
Lead Channel Setting Pacing Amplitude: 1.5 V
Lead Channel Setting Pacing Amplitude: 2.5 V
Lead Channel Setting Pacing Pulse Width: 0.4 ms
Lead Channel Setting Sensing Sensitivity: 1.2 mV

## 2021-04-20 DIAGNOSIS — U071 COVID-19: Secondary | ICD-10-CM | POA: Diagnosis not present

## 2021-04-20 DIAGNOSIS — I480 Paroxysmal atrial fibrillation: Secondary | ICD-10-CM | POA: Diagnosis not present

## 2021-04-20 DIAGNOSIS — I5043 Acute on chronic combined systolic (congestive) and diastolic (congestive) heart failure: Secondary | ICD-10-CM | POA: Diagnosis not present

## 2021-04-20 DIAGNOSIS — I110 Hypertensive heart disease with heart failure: Secondary | ICD-10-CM | POA: Diagnosis not present

## 2021-04-20 DIAGNOSIS — E119 Type 2 diabetes mellitus without complications: Secondary | ICD-10-CM | POA: Diagnosis not present

## 2021-04-20 DIAGNOSIS — I080 Rheumatic disorders of both mitral and aortic valves: Secondary | ICD-10-CM | POA: Diagnosis not present

## 2021-04-20 DIAGNOSIS — M47812 Spondylosis without myelopathy or radiculopathy, cervical region: Secondary | ICD-10-CM | POA: Diagnosis not present

## 2021-04-20 DIAGNOSIS — G4730 Sleep apnea, unspecified: Secondary | ICD-10-CM | POA: Diagnosis not present

## 2021-04-20 DIAGNOSIS — M5116 Intervertebral disc disorders with radiculopathy, lumbar region: Secondary | ICD-10-CM | POA: Diagnosis not present

## 2021-04-20 DIAGNOSIS — I483 Typical atrial flutter: Secondary | ICD-10-CM | POA: Diagnosis not present

## 2021-04-21 DIAGNOSIS — M5116 Intervertebral disc disorders with radiculopathy, lumbar region: Secondary | ICD-10-CM | POA: Diagnosis not present

## 2021-04-21 DIAGNOSIS — I483 Typical atrial flutter: Secondary | ICD-10-CM | POA: Diagnosis not present

## 2021-04-21 DIAGNOSIS — M47812 Spondylosis without myelopathy or radiculopathy, cervical region: Secondary | ICD-10-CM | POA: Diagnosis not present

## 2021-04-21 DIAGNOSIS — U071 COVID-19: Secondary | ICD-10-CM | POA: Diagnosis not present

## 2021-04-21 DIAGNOSIS — I480 Paroxysmal atrial fibrillation: Secondary | ICD-10-CM | POA: Diagnosis not present

## 2021-04-21 DIAGNOSIS — I5043 Acute on chronic combined systolic (congestive) and diastolic (congestive) heart failure: Secondary | ICD-10-CM | POA: Diagnosis not present

## 2021-04-21 DIAGNOSIS — G4730 Sleep apnea, unspecified: Secondary | ICD-10-CM | POA: Diagnosis not present

## 2021-04-21 DIAGNOSIS — I110 Hypertensive heart disease with heart failure: Secondary | ICD-10-CM | POA: Diagnosis not present

## 2021-04-21 DIAGNOSIS — E119 Type 2 diabetes mellitus without complications: Secondary | ICD-10-CM | POA: Diagnosis not present

## 2021-04-21 DIAGNOSIS — I080 Rheumatic disorders of both mitral and aortic valves: Secondary | ICD-10-CM | POA: Diagnosis not present

## 2021-04-21 NOTE — Telephone Encounter (Signed)
[employer] [facility] [phone]. LMOM for Levada Dy to return call. Awaiting callback.

## 2021-04-22 NOTE — Telephone Encounter (Signed)
[employer] and spoke with [employer].  She stated that the OV Note dated 04/06/2021 from [facility] will have to be addendum stating:  Patient can no longer use a cane or walker and needs transport Wheelchair. Diagnoses for the [employer].   Please add Addendum OV note To be faxed back to [facility] Fax: [phone]

## 2021-04-22 NOTE — Telephone Encounter (Signed)
Note Faxed

## 2021-04-25 NOTE — Progress Notes (Signed)
Remote pacemaker transmission.   

## 2021-05-02 DIAGNOSIS — I080 Rheumatic disorders of both mitral and aortic valves: Secondary | ICD-10-CM | POA: Diagnosis not present

## 2021-05-02 DIAGNOSIS — E119 Type 2 diabetes mellitus without complications: Secondary | ICD-10-CM | POA: Diagnosis not present

## 2021-05-02 DIAGNOSIS — I5033 Acute on chronic diastolic (congestive) heart failure: Secondary | ICD-10-CM | POA: Diagnosis not present

## 2021-05-02 DIAGNOSIS — I5043 Acute on chronic combined systolic (congestive) and diastolic (congestive) heart failure: Secondary | ICD-10-CM | POA: Diagnosis not present

## 2021-05-02 DIAGNOSIS — M47812 Spondylosis without myelopathy or radiculopathy, cervical region: Secondary | ICD-10-CM | POA: Diagnosis not present

## 2021-05-02 DIAGNOSIS — M6281 Muscle weakness (generalized): Secondary | ICD-10-CM | POA: Diagnosis not present

## 2021-05-02 DIAGNOSIS — U071 COVID-19: Secondary | ICD-10-CM | POA: Diagnosis not present

## 2021-05-02 DIAGNOSIS — I480 Paroxysmal atrial fibrillation: Secondary | ICD-10-CM | POA: Diagnosis not present

## 2021-05-02 DIAGNOSIS — M5116 Intervertebral disc disorders with radiculopathy, lumbar region: Secondary | ICD-10-CM | POA: Diagnosis not present

## 2021-05-02 DIAGNOSIS — I69354 Hemiplegia and hemiparesis following cerebral infarction affecting left non-dominant side: Secondary | ICD-10-CM | POA: Diagnosis not present

## 2021-05-02 DIAGNOSIS — I483 Typical atrial flutter: Secondary | ICD-10-CM | POA: Diagnosis not present

## 2021-05-02 DIAGNOSIS — I110 Hypertensive heart disease with heart failure: Secondary | ICD-10-CM | POA: Diagnosis not present

## 2021-05-02 DIAGNOSIS — G4730 Sleep apnea, unspecified: Secondary | ICD-10-CM | POA: Diagnosis not present

## 2021-05-10 DIAGNOSIS — U071 COVID-19: Secondary | ICD-10-CM | POA: Diagnosis not present

## 2021-05-10 DIAGNOSIS — E119 Type 2 diabetes mellitus without complications: Secondary | ICD-10-CM | POA: Diagnosis not present

## 2021-05-10 DIAGNOSIS — I480 Paroxysmal atrial fibrillation: Secondary | ICD-10-CM | POA: Diagnosis not present

## 2021-05-10 DIAGNOSIS — I110 Hypertensive heart disease with heart failure: Secondary | ICD-10-CM | POA: Diagnosis not present

## 2021-05-10 DIAGNOSIS — I5043 Acute on chronic combined systolic (congestive) and diastolic (congestive) heart failure: Secondary | ICD-10-CM | POA: Diagnosis not present

## 2021-05-10 DIAGNOSIS — I483 Typical atrial flutter: Secondary | ICD-10-CM | POA: Diagnosis not present

## 2021-05-10 DIAGNOSIS — M47812 Spondylosis without myelopathy or radiculopathy, cervical region: Secondary | ICD-10-CM | POA: Diagnosis not present

## 2021-05-10 DIAGNOSIS — M5116 Intervertebral disc disorders with radiculopathy, lumbar region: Secondary | ICD-10-CM | POA: Diagnosis not present

## 2021-05-10 DIAGNOSIS — I080 Rheumatic disorders of both mitral and aortic valves: Secondary | ICD-10-CM | POA: Diagnosis not present

## 2021-05-10 DIAGNOSIS — G4730 Sleep apnea, unspecified: Secondary | ICD-10-CM | POA: Diagnosis not present

## 2021-05-11 DIAGNOSIS — I110 Hypertensive heart disease with heart failure: Secondary | ICD-10-CM | POA: Diagnosis not present

## 2021-05-11 DIAGNOSIS — I483 Typical atrial flutter: Secondary | ICD-10-CM | POA: Diagnosis not present

## 2021-05-11 DIAGNOSIS — I080 Rheumatic disorders of both mitral and aortic valves: Secondary | ICD-10-CM | POA: Diagnosis not present

## 2021-05-11 DIAGNOSIS — I5043 Acute on chronic combined systolic (congestive) and diastolic (congestive) heart failure: Secondary | ICD-10-CM | POA: Diagnosis not present

## 2021-05-11 DIAGNOSIS — M5116 Intervertebral disc disorders with radiculopathy, lumbar region: Secondary | ICD-10-CM | POA: Diagnosis not present

## 2021-05-11 DIAGNOSIS — M47812 Spondylosis without myelopathy or radiculopathy, cervical region: Secondary | ICD-10-CM | POA: Diagnosis not present

## 2021-05-11 DIAGNOSIS — U071 COVID-19: Secondary | ICD-10-CM | POA: Diagnosis not present

## 2021-05-11 DIAGNOSIS — E119 Type 2 diabetes mellitus without complications: Secondary | ICD-10-CM | POA: Diagnosis not present

## 2021-05-11 DIAGNOSIS — I480 Paroxysmal atrial fibrillation: Secondary | ICD-10-CM | POA: Diagnosis not present

## 2021-05-11 DIAGNOSIS — G4730 Sleep apnea, unspecified: Secondary | ICD-10-CM | POA: Diagnosis not present

## 2021-05-12 ENCOUNTER — Other Ambulatory Visit: Payer: Self-pay | Admitting: Nurse Practitioner

## 2021-05-12 DIAGNOSIS — I10 Essential (primary) hypertension: Secondary | ICD-10-CM

## 2021-05-12 DIAGNOSIS — N401 Enlarged prostate with lower urinary tract symptoms: Secondary | ICD-10-CM

## 2021-05-12 DIAGNOSIS — R350 Frequency of micturition: Secondary | ICD-10-CM

## 2021-05-12 DIAGNOSIS — I480 Paroxysmal atrial fibrillation: Secondary | ICD-10-CM

## 2021-05-13 ENCOUNTER — Other Ambulatory Visit: Payer: Self-pay

## 2021-05-13 ENCOUNTER — Ambulatory Visit (INDEPENDENT_AMBULATORY_CARE_PROVIDER_SITE_OTHER): Payer: Medicare HMO | Admitting: Nurse Practitioner

## 2021-05-13 ENCOUNTER — Encounter: Payer: Self-pay | Admitting: Nurse Practitioner

## 2021-05-13 VITALS — BP 156/70 | HR 71 | Temp 96.2°F | Ht 69.0 in | Wt 164.0 lb

## 2021-05-13 DIAGNOSIS — I10 Essential (primary) hypertension: Secondary | ICD-10-CM | POA: Diagnosis not present

## 2021-05-13 DIAGNOSIS — Z23 Encounter for immunization: Secondary | ICD-10-CM

## 2021-05-13 DIAGNOSIS — F320 Major depressive disorder, single episode, mild: Secondary | ICD-10-CM

## 2021-05-13 DIAGNOSIS — I5030 Unspecified diastolic (congestive) heart failure: Secondary | ICD-10-CM | POA: Diagnosis not present

## 2021-05-13 DIAGNOSIS — I480 Paroxysmal atrial fibrillation: Secondary | ICD-10-CM | POA: Diagnosis not present

## 2021-05-13 DIAGNOSIS — R69 Illness, unspecified: Secondary | ICD-10-CM | POA: Diagnosis not present

## 2021-05-13 MED ORDER — HYDRALAZINE HCL 25 MG PO TABS: 25.0000 mg | ORAL_TABLET | Freq: Two times a day (BID) | ORAL | 1 refills | Status: DC

## 2021-05-13 MED ORDER — POTASSIUM CHLORIDE CRYS ER 10 MEQ PO TBCR: 10.0000 meq | EXTENDED_RELEASE_TABLET | Freq: Every day | ORAL | 1 refills | Status: DC

## 2021-05-13 MED ORDER — AMIODARONE HCL 200 MG PO TABS: 200.0000 mg | ORAL_TABLET | Freq: Every day | ORAL | 1 refills | Status: DC

## 2021-05-13 MED ORDER — FUROSEMIDE 20 MG PO TABS: 20.0000 mg | ORAL_TABLET | Freq: Every day | ORAL | 3 refills | Status: DC

## 2021-05-13 NOTE — Patient Instructions (Addendum)
Increase lasix to 20 mg by mouth daily for 5 days then start back to every other day.  Give potassium on the days he gets lasix.  increase hydralazine to twice daily take 25 mg  Monitor bp at home.    Elevate arm when sitting and laying down Monitor for redness, pain, redness

## 2021-05-13 NOTE — Progress Notes (Signed)
Careteam: Patient Care Team: [name], NP as PCP - General (Geriatric Medicine) [name], MD as PCP - Cardiology (Cardiology) Center, [facility] Endoscopy (Gastroenterology) [name], MD as Consulting Physician  PLACE OF SERVICE:  [place] Directive information Does Patient Have a Medical Advance Directive?: Yes, Type of Advance Directive: [place];Living will, Does patient want to make changes to medical advance directive?: No - Patient declined  Allergies  Allergen Reactions   Aricept [Donepezil] Diarrhea, Nausea And Vomiting and Other (See Comments)    Cannot tolerate- bradycardia, also   Tropicamide Nausea And Vomiting    Used to dilate eyes (might be this- was switched to something this was tolerable, after this)   Adhesive [Tape] Rash    Chief Complaint  Patient presents with   Follow-up    1 month follow-up on depression. Foot exam and flu vaccine today. Discuss need for shingrix and eye exam or postpone/exclude if patient refuses.      HPI: Patient is a 87 y.o. male for follow up depression Started wellbutrin but then had side effects of nausea and vomiting. She then became more weak. After progressive worsening of weakness they stopped wellbutrin and things have been much better.  He will take trazodone as needed- has occasionally. Has not needed it recently reports he has slept well.   He is taking lasix 20 mg every other day. He reports worsening shortness of breath. Swelling noted to left arm, otherwise without significant edema. No chest pains.   Only giving hydralazine at night.   Review of Systems:  Review of Systems  Constitutional:  Negative for chills, fever and weight loss.  HENT:  Negative for tinnitus.   Respiratory:  Positive for shortness of breath. Negative for cough and sputum production.   Cardiovascular:  Negative for chest pain, palpitations and leg swelling.  Gastrointestinal:  Negative for  abdominal pain, constipation, diarrhea and heartburn.  Genitourinary:  Negative for dysuria, frequency and urgency.  Musculoskeletal:  Negative for back pain, falls, joint pain and myalgias.  Skin: Negative.   Neurological:  Negative for dizziness and headaches.  Psychiatric/Behavioral:  Positive for memory loss. Negative for depression. The patient does not have insomnia.    Past Medical History:  Diagnosis Date   A-fib ([place])    Anticoagulated on Coumadin    Per records from [facility]    Aortic regurgitation    Aortic stenosis    CHF (congestive heart failure) ([place])    Coronary artery disease    COVID    CVA (cerebral vascular accident) ([place])    hx of CVA noted on CT from 02/19/18   Diabetes mellitus, type 2 ([place])    Gastro-esophageal reflux disease without esophagitis    Per records from previous provider, [facility]   Heart attack [facility])    History of CT scan of head 02/19/2018   Per records from previous provider, [facility]. Chronic changes small vessel disease   History of ECG    03/26/14- Sinus Tachycardia @ 107 bmp, QRS 78 msec, QT 298 msec, QTc 361 msec. 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 [facility])    Per records from previous provider, [facility]    MI (myocardial  infarction) [facility])    Per records from [place]    Mitral regurgitation    Mitral stenosis    Other intervertebral disc degeneration, lumbar region    Per records from previous provider, [facility]   Radiculopathy, lumbar region    Per records from previous provider, [facility]   Sinus bradycardia    Per records from [place]    Sleep apnea    Transient ischemic  attack    10 to 12 years ago   Typical atrial flutter [facility])    Per records from [place]    Past Surgical History:  Procedure Laterality Date   [place] N/A 12/26/2018   Procedure: CARDIOVERSION;  Surgeon: Pixie Casino, MD;  Location: [place];  Service: Cardiovascular;  Laterality: N/A;   CARDIOVERSION N/A 05/14/2019   Procedure: CARDIOVERSION;  Surgeon: [name], MD;  Location: [place] ENDOSCOPY;  Service: Cardiovascular;  Laterality: N/A;   CATARACT EXTRACTION     Debridement to right arm with MRSA infection     4 years ago   GALLBLADDER SURGERY     Per records from [place] 07/07/2019   Procedure: PACEMAKER IMPLANT;  Surgeon: [name], MD;  Location: [place] CV LAB;  Service: Cardiovascular;  Laterality: N/A;   TEE WITHOUT CARDIOVERSION N/A 12/26/2018   Procedure: TRANSESOPHAGEAL ECHOCARDIOGRAM (TEE);  Surgeon: Pixie Casino, MD;  Location: [facility] ENDOSCOPY;  Service: Cardiovascular;  Laterality: N/A;   Social History:   reports that he quit smoking about 67 years ago. His smoking use included cigarettes and cigars. He has never used smokeless tobacco. He reports that he does not drink alcohol and does not use drugs.  Family History  Problem Relation Age of Onset   Heart failure Mother    Hyperlipidemia Mother    Hypertension Son    Hyperlipidemia Son    Arthritis Son    Arthritis Daughter    Hypertension Son     Medications: Patient's Medications  New Prescriptions   No medications on file  Previous Medications   AMOXICILLIN (AMOXIL) 500 MG CAPSULE    Take 2,000 mg by mouth See admin instructions. Take 2,000 mg by mouth one hour prior to dental appointment   BUPROPION ER (WELLBUTRIN SR) 100 MG 12 HR TABLET    Take 1 tablet (100 mg total) by mouth daily.   CHOLECALCIFEROL (VITAMIN D3) 50 MCG (2000 UT) TABS    Take 2,000 Units by mouth daily with breakfast.   DOCUSATE  SODIUM (COLACE) 100 MG CAPSULE    Take 100 mg by mouth daily.   GABAPENTIN (NEURONTIN) 100 MG CAPSULE    Take 1 capsule (100 mg total) by mouth daily as needed.   LOSARTAN (COZAAR) 100 MG TABLET    TAKE ONE TABLET BY MOUTH ONE TIME DAILY   MELATONIN 5 MG TABS    Take 5 mg by mouth at bedtime as needed (for sleep).    MEMANTINE (NAMENDA) 10 MG TABLET    Take 1 tablet (10 mg total) by mouth 2 (two) times daily.   METOPROLOL SUCCINATE (TOPROL-XL) 50 MG 24 HR TABLET    Take 1 tablet (50 mg total) by mouth daily.   MULTIPLE VITAMIN (MULTIVITAMIN) TABLET    Take 1 tablet by mouth daily.   RIVAROXABAN (XARELTO) 20 MG TABS TABLET    TAKE ONE TABLET BY MOUTH ONE  TIME DAILY with supper   TAMSULOSIN (FLOMAX) 0.4 MG CAPS CAPSULE    TAKE ONE CAPSULE BY MOUTH ONE TIME DAILY   TRAZODONE (DESYREL) 50 MG TABLET    Take 1/2 to One (25-50mg )  tablet by mouth once daily at bedtime for sleep.  Modified Medications   Modified Medication Previous Medication   AMIODARONE (PACERONE) 200 MG TABLET amiodarone (PACERONE) 200 MG tablet      Take 1 tablet (200 mg total) by mouth daily.    Take 1 tablet (200 mg total) by mouth daily.   FUROSEMIDE (LASIX) 20 MG TABLET furosemide (LASIX) 20 MG tablet      Take 1 tablet (20 mg total) by mouth daily.    Take 1 tablet (20 mg total) by mouth daily.   HYDRALAZINE (APRESOLINE) 25 MG TABLET hydrALAZINE (APRESOLINE) 25 MG tablet      Take 1 tablet (25 mg total) by mouth 2 (two) times daily.    Take 1 tablet (25 mg total) by mouth 2 (two) times daily.   POTASSIUM CHLORIDE (KLOR-CON) 10 MEQ TABLET potassium chloride (KLOR-CON) 10 MEQ tablet      Take 1 tablet (10 mEq total) by mouth daily.    Take 1 tablet (10 mEq total) by mouth daily.  Discontinued Medications   No medications on file    Physical Exam:  Vitals:   05/13/21 1635  BP: (!) 156/70  Pulse: 71  Temp: (!) 96.2 F (35.7 C)  SpO2: 98%  Weight: 164 lb (74.4 kg)  Height: 5\' 9"  (1.753 m)   Body mass index is 24.22  kg/m. Wt Readings from Last 3 Encounters:  05/13/21 164 lb (74.4 kg)  04/06/21 166 lb 12.8 oz (75.7 kg)  03/30/21 163 lb 2.3 oz (74 kg)    Physical Exam Constitutional:      General: He is not in acute distress.    Appearance: He is well-developed. He is not diaphoretic.  HENT:     Head: Normocephalic and atraumatic.     Right Ear: External ear normal.     Left Ear: External ear normal.     Mouth/Throat:     Pharynx: No oropharyngeal exudate.  Eyes:     Conjunctiva/sclera: Conjunctivae normal.     Pupils: Pupils are equal, round, and reactive to light.  Cardiovascular:     Rate and Rhythm: Normal rate and regular rhythm.     Heart sounds: Normal heart sounds.  Pulmonary:     Effort: Pulmonary effort is normal.     Breath sounds: Normal breath sounds.  Abdominal:     General: Bowel sounds are normal.     Palpations: Abdomen is soft.  Musculoskeletal:        General: Swelling (noted to left medial arm, no redness or pain) present. No tenderness.     Cervical back: Normal range of motion and neck supple.     Right lower leg: No edema.     Left lower leg: No edema.  Skin:    General: Skin is warm and dry.  Neurological:     Mental Status: He is alert and oriented to person, place, and time. Mental status is at baseline.  Psychiatric:        Mood and Affect: Mood normal.    Labs reviewed: Basic Metabolic Panel: Recent Labs    11/01/20 1049 02/25/21 1521 03/25/21 0409 03/26/21 0403 03/27/21 0436 04/06/21 1558  NA 142   < >  --  138 138 140  K 4.1   < >  --  3.3* 4.6 4.7  CL 104   < >  --  104 106 103  CO2 25   < >  --  26 26 28   GLUCOSE 106*   < >  --  110* 101* 196*  BUN 22   < >  --  32* 38* 35*  CREATININE 1.11   < >  --  1.30* 1.23 1.49*  CALCIUM 8.4*   < >  --  8.5* 8.3* 8.9  TSH 1.800  --  1.852  --   --   --    < > = values in this interval not displayed.   Liver Function Tests: Recent Labs    11/01/20 1049 02/25/21 1521 03/26/21 0403  03/27/21 0436 04/06/21 1558  AST 31   < > 154* 231* 57*  ALT 51*   < > 186* 344* 125*  ALKPHOS 103  --  88 80  --   BILITOT 0.6   < > 1.0 0.8 0.8  PROT 6.7   < > 5.9* 5.7* 6.4  ALBUMIN 3.7  --  2.8* 2.7*  --    < > = values in this interval not displayed.   No results for input(s): LIPASE, AMYLASE in the last 8760 hours. No results for input(s): AMMONIA in the last 8760 hours. CBC: Recent Labs    03/26/21 0403 03/27/21 0436 04/06/21 1558  WBC 6.9 6.5 7.0  NEUTROABS 5.0 4.1 4,844  HGB 13.8 13.7 14.8  HCT 40.3 41.6 47.0  MCV 93.3 93.9 97.1  PLT 228 207 227   Lipid Panel: Recent Labs    11/01/20 1049  CHOL 179  HDL 33*  LDLCALC 124*  TRIG 122  CHOLHDL 5.4*   TSH: Recent Labs    11/01/20 1049 03/25/21 0409  TSH 1.800 1.852   A1C: Lab Results  Component Value Date   HGBA1C 5.7 (H) 02/25/2021     Assessment/Plan 1. Need for influenza vaccination - Flu Vaccine QUAD High Dose(Fluad)  2. Diastolic congestive heart failure, unspecified HF chronicity (HCC) -increase in shortness of breath with activities and talking. Will have him increase lasix to daily for 5 days at this time then resume every other day. To take potassium with lasix.  - potassium chloride (KLOR-CON) 10 MEQ tablet; Take 1 tablet (10 mEq total) by mouth daily.  Dispense: 90 tablet; Refill: 1 - furosemide (LASIX) 20 MG tablet; Take 1 tablet (20 mg total) by mouth daily.  Dispense: 90 tablet; Refill: 3  3. Depression, major, single episode, mild ([place]) -doing better at this time. All medication has been stopped for mood. Will continue to monitor.   4. Essential hypertension -elevated today, to start taking hydralazine 25 mg BID. Also with increase in lasix should bring bp to goal. Continue low sodium diet.  - hydrALAZINE (APRESOLINE) 25 MG tablet; Take 1 tablet (25 mg total) by mouth 2 (two) times daily.  Dispense: 180 tablet; Refill: 1  5. Paroxysmal atrial fibrillation (HCC) -rate controlled.   - amiodarone (PACERONE) 200 MG tablet; Take 1 tablet (200 mg total) by mouth daily.  Dispense: 90 tablet; Refill: 1    Next appt: 08/26/2021., sooner if needed  Chales Pelissier K. [place], [facility] [phone]

## 2021-05-17 ENCOUNTER — Other Ambulatory Visit: Payer: Self-pay | Admitting: Nurse Practitioner

## 2021-05-17 DIAGNOSIS — F0390 Unspecified dementia without behavioral disturbance: Secondary | ICD-10-CM

## 2021-05-18 DIAGNOSIS — E119 Type 2 diabetes mellitus without complications: Secondary | ICD-10-CM | POA: Diagnosis not present

## 2021-05-18 DIAGNOSIS — U071 COVID-19: Secondary | ICD-10-CM | POA: Diagnosis not present

## 2021-05-18 DIAGNOSIS — I480 Paroxysmal atrial fibrillation: Secondary | ICD-10-CM | POA: Diagnosis not present

## 2021-05-18 DIAGNOSIS — I483 Typical atrial flutter: Secondary | ICD-10-CM | POA: Diagnosis not present

## 2021-05-18 DIAGNOSIS — I110 Hypertensive heart disease with heart failure: Secondary | ICD-10-CM | POA: Diagnosis not present

## 2021-05-18 DIAGNOSIS — I080 Rheumatic disorders of both mitral and aortic valves: Secondary | ICD-10-CM | POA: Diagnosis not present

## 2021-05-18 DIAGNOSIS — I5043 Acute on chronic combined systolic (congestive) and diastolic (congestive) heart failure: Secondary | ICD-10-CM | POA: Diagnosis not present

## 2021-05-18 DIAGNOSIS — G4730 Sleep apnea, unspecified: Secondary | ICD-10-CM | POA: Diagnosis not present

## 2021-05-18 DIAGNOSIS — M5116 Intervertebral disc disorders with radiculopathy, lumbar region: Secondary | ICD-10-CM | POA: Diagnosis not present

## 2021-05-18 DIAGNOSIS — M47812 Spondylosis without myelopathy or radiculopathy, cervical region: Secondary | ICD-10-CM | POA: Diagnosis not present

## 2021-05-26 DIAGNOSIS — I080 Rheumatic disorders of both mitral and aortic valves: Secondary | ICD-10-CM | POA: Diagnosis not present

## 2021-05-26 DIAGNOSIS — I5043 Acute on chronic combined systolic (congestive) and diastolic (congestive) heart failure: Secondary | ICD-10-CM | POA: Diagnosis not present

## 2021-05-26 DIAGNOSIS — E119 Type 2 diabetes mellitus without complications: Secondary | ICD-10-CM | POA: Diagnosis not present

## 2021-05-26 DIAGNOSIS — M47812 Spondylosis without myelopathy or radiculopathy, cervical region: Secondary | ICD-10-CM | POA: Diagnosis not present

## 2021-05-26 DIAGNOSIS — I110 Hypertensive heart disease with heart failure: Secondary | ICD-10-CM | POA: Diagnosis not present

## 2021-05-26 DIAGNOSIS — U071 COVID-19: Secondary | ICD-10-CM | POA: Diagnosis not present

## 2021-05-26 DIAGNOSIS — I483 Typical atrial flutter: Secondary | ICD-10-CM | POA: Diagnosis not present

## 2021-05-26 DIAGNOSIS — I480 Paroxysmal atrial fibrillation: Secondary | ICD-10-CM | POA: Diagnosis not present

## 2021-05-26 DIAGNOSIS — M5116 Intervertebral disc disorders with radiculopathy, lumbar region: Secondary | ICD-10-CM | POA: Diagnosis not present

## 2021-05-26 DIAGNOSIS — G4730 Sleep apnea, unspecified: Secondary | ICD-10-CM | POA: Diagnosis not present

## 2021-05-27 DIAGNOSIS — M5116 Intervertebral disc disorders with radiculopathy, lumbar region: Secondary | ICD-10-CM | POA: Diagnosis not present

## 2021-05-27 DIAGNOSIS — I5043 Acute on chronic combined systolic (congestive) and diastolic (congestive) heart failure: Secondary | ICD-10-CM | POA: Diagnosis not present

## 2021-05-27 DIAGNOSIS — E119 Type 2 diabetes mellitus without complications: Secondary | ICD-10-CM | POA: Diagnosis not present

## 2021-05-27 DIAGNOSIS — U071 COVID-19: Secondary | ICD-10-CM | POA: Diagnosis not present

## 2021-05-27 DIAGNOSIS — I480 Paroxysmal atrial fibrillation: Secondary | ICD-10-CM | POA: Diagnosis not present

## 2021-05-27 DIAGNOSIS — M47812 Spondylosis without myelopathy or radiculopathy, cervical region: Secondary | ICD-10-CM | POA: Diagnosis not present

## 2021-05-27 DIAGNOSIS — I483 Typical atrial flutter: Secondary | ICD-10-CM | POA: Diagnosis not present

## 2021-05-27 DIAGNOSIS — G4730 Sleep apnea, unspecified: Secondary | ICD-10-CM | POA: Diagnosis not present

## 2021-05-27 DIAGNOSIS — I110 Hypertensive heart disease with heart failure: Secondary | ICD-10-CM | POA: Diagnosis not present

## 2021-05-27 DIAGNOSIS — I080 Rheumatic disorders of both mitral and aortic valves: Secondary | ICD-10-CM | POA: Diagnosis not present

## 2021-06-02 DIAGNOSIS — I5033 Acute on chronic diastolic (congestive) heart failure: Secondary | ICD-10-CM | POA: Diagnosis not present

## 2021-06-02 DIAGNOSIS — I69354 Hemiplegia and hemiparesis following cerebral infarction affecting left non-dominant side: Secondary | ICD-10-CM | POA: Diagnosis not present

## 2021-06-02 DIAGNOSIS — M6281 Muscle weakness (generalized): Secondary | ICD-10-CM | POA: Diagnosis not present

## 2021-06-10 ENCOUNTER — Ambulatory Visit (INDEPENDENT_AMBULATORY_CARE_PROVIDER_SITE_OTHER): Payer: Medicare HMO | Admitting: Orthopedic Surgery

## 2021-06-10 ENCOUNTER — Other Ambulatory Visit: Payer: Self-pay

## 2021-06-10 ENCOUNTER — Encounter: Payer: Self-pay | Admitting: Orthopedic Surgery

## 2021-06-10 VITALS — BP 140/62 | HR 68 | Temp 97.3°F | Resp 16 | Ht 69.0 in | Wt 165.0 lb

## 2021-06-10 DIAGNOSIS — I5030 Unspecified diastolic (congestive) heart failure: Secondary | ICD-10-CM

## 2021-06-10 MED ORDER — TORSEMIDE 20 MG PO TABS: 20.0000 mg | ORAL_TABLET | Freq: Every day | ORAL | 5 refills | Status: DC

## 2021-06-10 NOTE — Progress Notes (Signed)
Careteam: Patient Care Team: [name], NP as PCP - General (Geriatric Medicine) [name], MD as PCP - Cardiology (Cardiology) Center, [facility] Endoscopy (Gastroenterology) [name], MD as Consulting Physician  Seen by: Windell Moulding, AGNP-C  PLACE OF SERVICE:  [place] Directive information Does Patient Have a Medical Advance Directive?: Yes, Type of Advance Directive: [place];Living will, Does patient want to make changes to medical advance directive?: No - Patient declined  Allergies  Allergen Reactions   Aricept [Donepezil] Diarrhea, Nausea And Vomiting and Other (See Comments)    Cannot tolerate- bradycardia, also   Tropicamide Nausea And Vomiting    Used to dilate eyes (might be this- was switched to something this was tolerable, after this)   Adhesive [Tape] Rash    Chief Complaint  Patient presents with   Acute Visit    Patient has history of congestive heart failure. Patient daughter complains of patient having difficulty breathing.      HPI: Patient is a 87 y.o. male seen today for acute visit due to shortness of breath.   Daughter present for encounter.   In the past few days, he has become more short of breath and left arm has begun to swell. History of CHF. LVEF 40-45 % 2021. He admits to not taking furosemide 20 mg daily, using every other day due to urinary frequency. Symptoms began when medication changes were made. Admits to following strict low sodium diet. He also weights himself daily. Weight stable today. O2 sats 96%.    Review of Systems:  Review of Systems  Constitutional:  Negative for chills, fever, malaise/fatigue and weight loss.  HENT:  Negative for congestion and sore throat.   Respiratory:  Positive for shortness of breath. Negative for cough and wheezing.   Cardiovascular:  Negative for chest pain and leg swelling.  Musculoskeletal:  Negative for falls.  Psychiatric/Behavioral:  Negative for  depression. The patient is not nervous/anxious.    Past Medical History:  Diagnosis Date   A-fib ([place])    Anticoagulated on Coumadin    Per records from [facility]    Aortic regurgitation    Aortic stenosis    CHF (congestive heart failure) ([place])    Coronary artery disease    COVID    CVA (cerebral vascular accident) ([place])    hx of CVA noted on CT from 02/19/18   Diabetes mellitus, type 2 ([place])    Gastro-esophageal reflux disease without esophagitis    Per records from previous provider, [facility]   Heart attack [facility])    History of CT scan of head 02/19/2018   Per records from previous provider, [facility]. Chronic changes small vessel disease   History of ECG    03/26/14- Sinus Tachycardia @ 107 bmp, QRS 78 msec, QT 298 msec, QTc 361 msec. 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 [facility])    Per records from previous provider, [facility]    MI (myocardial infarction) [facility])    Per records from [place]    Mitral regurgitation    Mitral stenosis    Other intervertebral disc degeneration, lumbar region    Per records from previous provider, [facility]  Radiculopathy, lumbar region    Per records from previous provider, [facility]   Sinus bradycardia    Per records from [place]    Sleep apnea    Transient ischemic attack    10 to 12 years ago   Typical atrial flutter [facility])    Per records from [place]    Past Surgical History:  Procedure Laterality Date   [place] N/A 12/26/2018   Procedure: CARDIOVERSION;  Surgeon: Pixie Casino, MD;  Location: [place];  Service:  Cardiovascular;  Laterality: N/A;   CARDIOVERSION N/A 05/14/2019   Procedure: CARDIOVERSION;  Surgeon: [name], MD;  [facility]: [facility] ENDOSCOPY;  Service: Cardiovascular;  Laterality: N/A;   CATARACT EXTRACTION     Debridement to right arm with MRSA infection     4 years ago   GALLBLADDER SURGERY     Per records from [place] 07/07/2019   Procedure: PACEMAKER IMPLANT;  Surgeon: [name], MD;  Location: [place] CV LAB;  Service: Cardiovascular;  Laterality: N/A;   TEE WITHOUT CARDIOVERSION N/A 12/26/2018   Procedure: TRANSESOPHAGEAL ECHOCARDIOGRAM (TEE);  Surgeon: Pixie Casino, MD;  Location[facility] ENDOSCOPY;  Service: Cardiovascular;  Laterality: N/A;   Social History:   reports that he quit smoking about 67 years ago. His smoking use included cigarettes and cigars. He has never used smokeless tobacco. He reports that he does not drink alcohol and does not use drugs.  Family History  Problem Relation Age of Onset   Heart failure Mother    Hyperlipidemia Mother    Hypertension Son    Hyperlipidemia Son    Arthritis Son    Arthritis Daughter    Hypertension Son     Medications: Patient's Medications  New Prescriptions   No medications on file  Previous Medications   AMIODARONE (PACERONE) 200 MG TABLET    Take 1 tablet (200 mg total) by mouth daily.   AMOXICILLIN (AMOXIL) 500 MG CAPSULE    Take 2,000 mg by mouth See admin instructions. Take 2,000 mg by mouth one hour prior to dental appointment   BUPROPION ER (WELLBUTRIN SR) 100 MG 12 HR TABLET    Take 1 tablet (100 mg total) by mouth daily.   CHOLECALCIFEROL (VITAMIN D3) 50 MCG (2000 UT) TABS    Take 2,000 Units by mouth daily with breakfast.   DOCUSATE SODIUM (COLACE) 100 MG CAPSULE    Take 100 mg by mouth daily.   FUROSEMIDE (LASIX) 20 MG TABLET    Take 1 tablet (20 mg total) by mouth daily.   HYDRALAZINE (APRESOLINE) 25 MG TABLET    Take 1 tablet (25 mg total) by mouth 2 (two) times  daily.   LOSARTAN (COZAAR) 100 MG TABLET    TAKE ONE TABLET BY MOUTH ONE TIME DAILY   MELATONIN 5 MG TABS    Take 5 mg by mouth at bedtime as needed (for sleep).    MEMANTINE (NAMENDA) 10 MG TABLET    TAKE ONE TABLET BY MOUTH TWICE DAILY   METOPROLOL SUCCINATE (TOPROL-XL) 50 MG 24 HR TABLET    Take 1 tablet (50 mg total) by mouth daily.   MULTIPLE VITAMIN (MULTIVITAMIN) TABLET    Take 1 tablet by mouth daily.   POTASSIUM CHLORIDE (KLOR-CON) 10 MEQ TABLET    Take 1 tablet (10 mEq total) by mouth daily.   RIVAROXABAN (XARELTO) 20 MG TABS TABLET  TAKE ONE TABLET BY MOUTH ONE TIME DAILY with supper   TAMSULOSIN (FLOMAX) 0.4 MG CAPS CAPSULE    TAKE ONE CAPSULE BY MOUTH ONE TIME DAILY   TRAZODONE (DESYREL) 50 MG TABLET    Take 1/2 to One (25-50mg )  tablet by mouth once daily at bedtime for sleep.  Modified Medications   No medications on file  Discontinued Medications   GABAPENTIN (NEURONTIN) 100 MG CAPSULE    Take 1 capsule (100 mg total) by mouth daily as needed.    Physical Exam:  Vitals:   06/10/21 1420  BP: 140/62  Pulse: 68  Resp: 16  Temp: (!) 97.3 F (36.3 C)  SpO2: 96%  Weight: 165 lb (74.8 kg)  Height: 5\' 9"  (1.753 m)   Body mass index is 24.37 kg/m. Wt Readings from Last 3 Encounters:  06/10/21 165 lb (74.8 kg)  05/13/21 164 lb (74.4 kg)  04/06/21 166 lb 12.8 oz (75.7 kg)    Physical Exam Vitals reviewed.  Constitutional:      General: He is not in acute distress. HENT:     Head: Normocephalic.  Eyes:     General:        Right eye: No discharge.        Left eye: No discharge.  Cardiovascular:     Rate and Rhythm: Normal rate. Rhythm irregular.     Pulses: Normal pulses.     Heart sounds: Normal heart sounds. No murmur heard. Pulmonary:     Effort: Pulmonary effort is normal. No respiratory distress.     Breath sounds: Examination of the left-middle field reveals rales. Rales present. No wheezing or rhonchi.  Musculoskeletal:     Right lower leg: No edema.      Left lower leg: No edema.     Comments: Left upper arm cool to touch, non pitting edema present.   Skin:    General: Skin is warm and dry.     Capillary Refill: Capillary refill takes less than 2 seconds.  Neurological:     General: No focal deficit present.     Mental Status: He is alert and oriented to person, place, and time.     Motor: Weakness present.     Gait: Gait abnormal.     Comments: wheelchair  Psychiatric:        Mood and Affect: Mood normal.        Behavior: Behavior normal.    Labs reviewed: Basic Metabolic Panel: Recent Labs    11/01/20 1049 02/25/21 1521 03/25/21 0409 03/26/21 0403 03/27/21 0436 04/06/21 1558  NA 142   < >  --  138 138 140  K 4.1   < >  --  3.3* 4.6 4.7  CL 104   < >  --  104 106 103  CO2 25   < >  --  26 26 28   GLUCOSE 106*   < >  --  110* 101* 196*  BUN 22   < >  --  32* 38* 35*  CREATININE 1.11   < >  --  1.30* 1.23 1.49*  CALCIUM 8.4*   < >  --  8.5* 8.3* 8.9  TSH 1.800  --  1.852  --   --   --    < > = values in this interval not displayed.   Liver Function Tests: Recent Labs    11/01/20 1049 02/25/21 1521 03/26/21 0403 03/27/21 0436 04/06/21 1558  AST 31   < > 154*  231* 57*  ALT 51*   < > 186* 344* 125*  ALKPHOS 103  --  88 80  --   BILITOT 0.6   < > 1.0 0.8 0.8  PROT 6.7   < > 5.9* 5.7* 6.4  ALBUMIN 3.7  --  2.8* 2.7*  --    < > = values in this interval not displayed.   No results for input(s): LIPASE, AMYLASE in the last 8760 hours. No results for input(s): AMMONIA in the last 8760 hours. CBC: Recent Labs    03/26/21 0403 03/27/21 0436 04/06/21 1558  WBC 6.9 6.5 7.0  NEUTROABS 5.0 4.1 4,844  HGB 13.8 13.7 14.8  HCT 40.3 41.6 47.0  MCV 93.3 93.9 97.1  PLT 228 207 227   Lipid Panel: Recent Labs    11/01/20 1049  CHOL 179  HDL 33*  LDLCALC 124*  TRIG 122  CHOLHDL 5.4*   TSH: Recent Labs    11/01/20 1049 03/25/21 0409  TSH 1.800 1.852   A1C: Lab Results  Component Value Date   HGBA1C  5.7 (H) 02/25/2021     Assessment/Plan 1. Diastolic congestive heart failure, unspecified HF chronicity (HCC) - no weight fluctuations or ankle edema, reports increased sob/ LUE edema - rales to left middle lobe on auscultation, sats 96% - suspect fluid gain due to medication noncompliance- dislikes urinary frequency - torsemide (DEMADEX) 20 MG tablet; Take 1 tablet (20 mg total) by mouth daily.  Dispense: 30 tablet; Refill: 5 - continue daily weights, if weight gain > 3 lbs advised to take extra dose torsemide - cont low sodium diet - advised to contact PCP if symptoms progress - recommend using urinal for urinary frequency after taking diuretic  Total time: 21 minutes. Greater than 50% of total time spent doing patient education regarding heart failure symptoms and medication management.   Next appt: none Roselia Snipe [place], [facility] [phone]

## 2021-06-10 NOTE — Patient Instructions (Signed)
Daily weights- if weight > 3lbs in one day please take extra dose of torsemide  Continue potassium daily  Continue low sodium diet  Avoid drinks with caffeine- they are bladder irritants

## 2021-06-15 DIAGNOSIS — G8929 Other chronic pain: Secondary | ICD-10-CM | POA: Diagnosis not present

## 2021-06-15 DIAGNOSIS — I110 Hypertensive heart disease with heart failure: Secondary | ICD-10-CM | POA: Diagnosis not present

## 2021-06-15 DIAGNOSIS — I509 Heart failure, unspecified: Secondary | ICD-10-CM | POA: Diagnosis not present

## 2021-06-15 DIAGNOSIS — D6859 Other primary thrombophilia: Secondary | ICD-10-CM | POA: Diagnosis not present

## 2021-06-15 DIAGNOSIS — I25119 Atherosclerotic heart disease of native coronary artery with unspecified angina pectoris: Secondary | ICD-10-CM | POA: Diagnosis not present

## 2021-06-15 DIAGNOSIS — I471 Supraventricular tachycardia: Secondary | ICD-10-CM | POA: Diagnosis not present

## 2021-06-15 DIAGNOSIS — M3500 Sicca syndrome, unspecified: Secondary | ICD-10-CM | POA: Diagnosis not present

## 2021-06-15 DIAGNOSIS — I4891 Unspecified atrial fibrillation: Secondary | ICD-10-CM | POA: Diagnosis not present

## 2021-06-15 DIAGNOSIS — E1142 Type 2 diabetes mellitus with diabetic polyneuropathy: Secondary | ICD-10-CM | POA: Diagnosis not present

## 2021-06-15 DIAGNOSIS — E261 Secondary hyperaldosteronism: Secondary | ICD-10-CM | POA: Diagnosis not present

## 2021-06-15 DIAGNOSIS — I4892 Unspecified atrial flutter: Secondary | ICD-10-CM | POA: Diagnosis not present

## 2021-06-15 DIAGNOSIS — D6869 Other thrombophilia: Secondary | ICD-10-CM | POA: Diagnosis not present

## 2021-06-15 DIAGNOSIS — I495 Sick sinus syndrome: Secondary | ICD-10-CM | POA: Diagnosis not present

## 2021-06-15 DIAGNOSIS — I719 Aortic aneurysm of unspecified site, without rupture: Secondary | ICD-10-CM | POA: Diagnosis not present

## 2021-06-15 DIAGNOSIS — E785 Hyperlipidemia, unspecified: Secondary | ICD-10-CM | POA: Diagnosis not present

## 2021-07-02 DIAGNOSIS — I5033 Acute on chronic diastolic (congestive) heart failure: Secondary | ICD-10-CM | POA: Diagnosis not present

## 2021-07-02 DIAGNOSIS — M6281 Muscle weakness (generalized): Secondary | ICD-10-CM | POA: Diagnosis not present

## 2021-07-02 DIAGNOSIS — I69354 Hemiplegia and hemiparesis following cerebral infarction affecting left non-dominant side: Secondary | ICD-10-CM | POA: Diagnosis not present

## 2021-07-18 ENCOUNTER — Ambulatory Visit (INDEPENDENT_AMBULATORY_CARE_PROVIDER_SITE_OTHER): Payer: Medicare HMO

## 2021-07-18 DIAGNOSIS — I495 Sick sinus syndrome: Secondary | ICD-10-CM

## 2021-07-18 LAB — CUP PACEART REMOTE DEVICE CHECK
Battery Remaining Longevity: 140 mo
Battery Voltage: 3.01 V
Brady Statistic AP VP Percent: 0.06 %
Brady Statistic AP VS Percent: 99.89 %
Brady Statistic AS VP Percent: 0 %
Brady Statistic AS VS Percent: 0.04 %
Brady Statistic RA Percent Paced: 99.95 %
Brady Statistic RV Percent Paced: 0.06 %
Date Time Interrogation Session: 20221219035030
Implantable Lead Implant Date: 20201207
Implantable Lead Implant Date: 20201207
Implantable Lead Location: 753859
Implantable Lead Location: 753860
Implantable Lead Model: 5076
Implantable Lead Model: 5076
Implantable Pulse Generator Implant Date: 20201207
Lead Channel Impedance Value: 380 Ohm
Lead Channel Impedance Value: 456 Ohm
Lead Channel Impedance Value: 494 Ohm
Lead Channel Impedance Value: 532 Ohm
Lead Channel Pacing Threshold Amplitude: 0.75 V
Lead Channel Pacing Threshold Amplitude: 1 V
Lead Channel Pacing Threshold Pulse Width: 0.4 ms
Lead Channel Pacing Threshold Pulse Width: 0.4 ms
Lead Channel Sensing Intrinsic Amplitude: 2.875 mV
Lead Channel Sensing Intrinsic Amplitude: 2.875 mV
Lead Channel Sensing Intrinsic Amplitude: 27.625 mV
Lead Channel Sensing Intrinsic Amplitude: 27.625 mV
Lead Channel Setting Pacing Amplitude: 1.5 V
Lead Channel Setting Pacing Amplitude: 2.5 V
Lead Channel Setting Pacing Pulse Width: 0.4 ms
Lead Channel Setting Sensing Sensitivity: 1.2 mV

## 2021-07-28 NOTE — Progress Notes (Signed)
Remote pacemaker transmission.   

## 2021-08-02 DIAGNOSIS — M6281 Muscle weakness (generalized): Secondary | ICD-10-CM | POA: Diagnosis not present

## 2021-08-02 DIAGNOSIS — I69354 Hemiplegia and hemiparesis following cerebral infarction affecting left non-dominant side: Secondary | ICD-10-CM | POA: Diagnosis not present

## 2021-08-02 DIAGNOSIS — I5033 Acute on chronic diastolic (congestive) heart failure: Secondary | ICD-10-CM | POA: Diagnosis not present

## 2021-08-24 ENCOUNTER — Encounter: Payer: Medicare HMO | Admitting: Nurse Practitioner

## 2021-08-25 ENCOUNTER — Ambulatory Visit (INDEPENDENT_AMBULATORY_CARE_PROVIDER_SITE_OTHER): Payer: Medicare HMO | Admitting: Nurse Practitioner

## 2021-08-25 ENCOUNTER — Telehealth: Payer: Self-pay

## 2021-08-25 ENCOUNTER — Encounter: Payer: Self-pay | Admitting: Nurse Practitioner

## 2021-08-25 ENCOUNTER — Other Ambulatory Visit: Payer: Self-pay

## 2021-08-25 DIAGNOSIS — Z0000 Encounter for general adult medical examination without abnormal findings: Secondary | ICD-10-CM

## 2021-08-25 NOTE — Patient Instructions (Signed)
Jose Patel , Thank you for taking time to come for your Medicare Wellness Visit. I appreciate your ongoing commitment to your health goals. Please review the following plan we discussed and let me know if I can assist you in the future.   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 What does preventive care include? A yearly physical exam. This is also called an annual well check. Dental exams once or twice a year. Routine eye exams. Ask your health care provider how often you should have your eyes checked. Personal lifestyle choices, including: Daily care of your teeth and gums. Regular physical activity. Eating a healthy diet. Avoiding tobacco and drug use. Limiting alcohol use. Practicing safe sex. Taking low doses of aspirin every day. Taking vitamin and mineral supplements as recommended by your health care provider. What happens during an annual well check? The services and screenings done by your health care provider during your annual well check will depend on your age, overall health, lifestyle risk factors, and family history of disease. Counseling  Your health care provider may ask you questions about your: Alcohol use. Tobacco use. Drug use. Emotional well-being. Home and relationship well-being. Sexual activity. Eating habits. History  of falls. Memory and ability to understand (cognition). Work and work Statistician. Screening  You may have the following tests or measurements: Height, weight, and BMI. Blood pressure. Lipid and cholesterol levels. These may be checked every 5 years, or more frequently if you are over 77 years old. Skin check. Lung cancer screening. You may have this screening every year starting at age 71 if you have a 30-pack-year history of smoking and currently smoke or have quit within the past 15 years. Fecal occult blood test (FOBT) of the stool. You may have this test every year starting at age 37. Flexible sigmoidoscopy or colonoscopy. You may have a sigmoidoscopy every 5 years or a colonoscopy every 10 years starting at age 60. Prostate cancer screening. Recommendations will vary depending on your family history and other risks. Hepatitis C blood test. Hepatitis B blood test. Sexually transmitted disease (STD) testing. Diabetes screening. This is done by checking your blood sugar (glucose) after you have not eaten for a while (fasting). You may have this done every 1-3 years. Abdominal aortic aneurysm (AAA) screening. You may need this if you are a current or former smoker. Osteoporosis. You may be screened starting at age 58 if you are at high risk. Talk with your health care provider about your test results, treatment options, and if necessary, the need for more tests. Vaccines  Your health care provider may recommend certain vaccines, such as: Influenza vaccine. This is recommended every year. Tetanus, diphtheria, and acellular pertussis (Tdap, Td) vaccine. You may need a Td booster every 10 years. Zoster vaccine. You may need this after age 66. Pneumococcal 13-valent conjugate (PCV13) vaccine. One dose is recommended after age 22. Pneumococcal polysaccharide (PPSV23) vaccine. One dose is recommended after  age 27. Talk to your health care provider about which screenings and vaccines you need  and how often you need them. This information is not intended to replace advice given to you by your health care provider. Make sure you discuss any questions you have with your health care provider. Document Released: 08/13/2015 Document Revised: 04/05/2016 Document Reviewed: 05/18/2015 Elsevier Interactive Patient Education  2017 [place] Prevention in the Home Falls can cause injuries. They can happen to people of all ages. There are many things you can do to make your home safe and to help prevent falls. What can I do on the outside of my home? Regularly fix the edges of walkways and driveways and fix any cracks. Remove anything that might make you trip as you walk through a door, such as a raised step or threshold. Trim any bushes or trees on the path to your home. Use bright outdoor lighting. Clear any walking paths of anything that might make someone trip, such as rocks or tools. Regularly check to see if handrails are loose or broken. Make sure that both sides of any steps have handrails. Any raised decks and porches should have guardrails on the edges. Have any leaves, snow, or ice cleared regularly. Use sand or salt on walking paths during winter. Clean up any spills in your garage right away. This includes oil or grease spills. What can I do in the bathroom? Use night lights. Install grab bars by the toilet and in the tub and shower. Do not use towel bars as grab bars. Use non-skid mats or decals in the tub or shower. If you need to sit down in the shower, use a plastic, non-slip stool. Keep the floor dry. Clean up any water that spills on the floor as soon as it happens. Remove soap buildup in the tub or shower regularly. Attach bath mats securely with double-sided non-slip rug tape. Do not have throw rugs and other things on the floor that can make you trip. What can I do in the bedroom? Use night lights. Make sure that you have a light by your bed that is easy  to reach. Do not use any sheets or blankets that are too big for your bed. They should not hang down onto the floor. Have a firm chair that has side arms. You can use this for support while you get dressed. Do not have throw rugs and other things on the floor that can make you trip. What can I do in the kitchen? Clean up any spills right away. Avoid walking on wet floors. Keep items that you use a lot in easy-to-reach places. If you need to reach something above you, use a strong step stool that has a grab bar. Keep electrical cords out of the way. Do not use floor polish or wax that makes floors slippery. If you must use wax, use non-skid floor wax. Do not have throw rugs and other things on the floor that can make you trip. What can I do with my stairs? Do not leave any items on the stairs. Make sure that there are handrails on both sides of the stairs and use them. Fix handrails that are broken or loose. Make sure that handrails are as long as the stairways. Check any carpeting to make sure that it is firmly attached to the stairs. Fix any carpet that is loose or worn. Avoid having throw rugs at the top or bottom of the stairs. If you do  have throw rugs, attach them to the floor with carpet tape. Make sure that you have a light switch at the top of the stairs and the bottom of the stairs. If you do not have them, ask someone to add them for you. What else can I do to help prevent falls? Wear shoes that: Do not have high heels. Have rubber bottoms. Are comfortable and fit you well. Are closed at the toe. Do not wear sandals. If you use a stepladder: Make sure that it is fully opened. Do not climb a closed stepladder. Make sure that both sides of the stepladder are locked into place. Ask someone to hold it for you, if possible. Clearly mark and make sure that you can see: Any grab bars or handrails. First and last steps. Where the edge of each step is. Use tools that help you move  around (mobility aids) if they are needed. These include: Canes. Walkers. Scooters. Crutches. Turn on the lights when you go into a dark area. Replace any light bulbs as soon as they burn out. Set up your furniture so you have a clear path. Avoid moving your furniture around. If any of your floors are uneven, fix them. If there are any pets around you, be aware of where they are. Review your medicines with your doctor. Some medicines can make you feel dizzy. This can increase your chance of falling. Ask your doctor what other things that you can do to help prevent falls. This information is not intended to replace advice given to you by your health care provider. Make sure you discuss any questions you have with your health care provider. Document Released: 05/13/2009 Document Revised: 12/23/2015 Document Reviewed: 08/21/2014 Elsevier Interactive Patient Education  2017 [employer].

## 2021-08-25 NOTE — Progress Notes (Signed)
This service is provided via telemedicine  No vital signs collected/recorded due to the encounter was a telemedicine visit.   Location of patient (ex: home, work):  Home  Patient consents to a telephone visit:  Yes, see encounter dated 08/25/2021  Location of the provider (ex: office, home):  [place]  Name of any referring provider:  N/A  Names of all persons participating in the telemedicine service and their role in the encounter:  Sherrie Mustache, Nurse Practitioner, Carroll Kinds, CMA, and patient.   Time spent on call:  11 minutes with medical assistant

## 2021-08-25 NOTE — Progress Notes (Signed)
Subjective:   Jose Patel is a 88 y.o. male who presents for Medicare Annual/Subsequent preventive examination.  Review of Systems           Objective:    There were no vitals filed for this visit. There is no height or weight on file to calculate BMI.  Advanced Directives 08/25/2021 06/10/2021 05/13/2021 04/06/2021 03/25/2021 03/16/2021 02/25/2021  Does Patient Have a Medical Advance Directive? Yes Yes Yes Yes Yes Yes Yes  Type of Paramedic of [place];Living will [place];Living will [place];Living will [place];Living will [place];Living will [place];Living will [place];Living will  Does patient want to make changes to medical advance directive? No - Patient declined No - Patient declined No - Patient declined No - Patient declined No - Patient declined No - Patient declined No - Patient declined  Copy of [place] in Chart? Yes - validated most recent copy scanned in chart (See row information) Yes - validated most recent copy scanned in chart (See row information) Yes - validated most recent copy scanned in chart (See row information) Yes - validated most recent copy scanned in chart (See row information) No - copy requested Yes - validated most recent copy scanned in chart (See row information) Yes - validated most recent copy scanned in chart (See row information)  Would patient like information on creating a medical advance directive? - - - - - - -    Current Medications (verified) Outpatient Encounter Medications as of 08/25/2021  Medication Sig   amiodarone (PACERONE) 200 MG tablet Take 1 tablet (200 mg total) by mouth daily.   amoxicillin (AMOXIL) 500 MG capsule Take 2,000 mg by mouth See admin instructions. Take 2,000 mg by mouth one hour prior to dental appointment   Cholecalciferol (VITAMIN D3) 50 MCG (2000 UT)  TABS Take 2,000 Units by mouth daily with breakfast.   hydrALAZINE (APRESOLINE) 25 MG tablet Take 1 tablet (25 mg total) by mouth 2 (two) times daily.   losartan (COZAAR) 100 MG tablet TAKE ONE TABLET BY MOUTH ONE TIME DAILY   Melatonin 5 MG TABS Take 5 mg by mouth at bedtime as needed (for sleep).    memantine (NAMENDA) 10 MG tablet TAKE ONE TABLET BY MOUTH TWICE DAILY   metoprolol succinate (TOPROL-XL) 50 MG 24 hr tablet Take 1 tablet (50 mg total) by mouth daily.   Multiple Vitamin (MULTIVITAMIN) tablet Take 1 tablet by mouth daily.   potassium chloride (KLOR-CON) 10 MEQ tablet Take 1 tablet (10 mEq total) by mouth daily.   rivaroxaban (XARELTO) 20 MG TABS tablet TAKE ONE TABLET BY MOUTH ONE TIME DAILY with supper   tamsulosin (FLOMAX) 0.4 MG CAPS capsule TAKE ONE CAPSULE BY MOUTH ONE TIME DAILY   torsemide (DEMADEX) 20 MG tablet Take 1 tablet (20 mg total) by mouth daily.   traZODone (DESYREL) 50 MG tablet Take 1/2 to One (25-50mg )  tablet by mouth once daily at bedtime for sleep.   [DISCONTINUED] buPROPion ER (WELLBUTRIN SR) 100 MG 12 hr tablet Take 1 tablet (100 mg total) by mouth daily.   [DISCONTINUED] docusate sodium (COLACE) 100 MG capsule Take 100 mg by mouth daily.   Facility-Administered Encounter Medications as of 08/25/2021  Medication   sodium chloride flush (NS) 0.9 % injection 3 mL    Allergies (verified) Aricept [donepezil], Tropicamide, and Adhesive [tape]   History: Past Medical History:  Diagnosis  Date   A-fib [facility])    Anticoagulated on Coumadin    Per records from [place]    Aortic regurgitation    Aortic stenosis    CHF (congestive heart failure) ([place])    Coronary artery disease    COVID    CVA (cerebral vascular accident) ([place])    hx of CVA noted on CT from 02/19/18   Diabetes mellitus, type 2 ([place])    Gastro-esophageal reflux disease without esophagitis    Per records from previous provider, [facility]   Heart attack  [facility])    History of CT scan of head 02/19/2018   Per records from previous provider, [facility]. Chronic changes small vessel disease   History of ECG    03/26/14- Sinus Tachycardia @ 107 bmp, QRS 78 msec, QT 298 msec, QTc 361 msec. 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 [facility])    Per records from previous provider, [facility]    MI (myocardial infarction) [facility])    Per records from [place]    Mitral regurgitation    Mitral stenosis    Other intervertebral disc degeneration, lumbar region    Per records from previous provider, Dorcas Mcmurray and [facility]   Radiculopathy, lumbar region    Per records from previous provider, [facility]   Sinus bradycardia    Per records from [place]    Sleep apnea    Transient ischemic attack    10 to 12 years ago   Typical atrial flutter [facility])    Per records from [place]    Past Surgical History:  Procedure Laterality Date   [place] N/A 12/26/2018   Procedure: CARDIOVERSION;  Surgeon: Pixie Casino, MD;  Location: [place];  Service: Cardiovascular;  Laterality: N/A;   CARDIOVERSION N/A 05/14/2019   Procedure: CARDIOVERSION;  Surgeon: [name], MD;  Location: [facility] ENDOSCOPY;  Service: Cardiovascular;  Laterality: N/A;   CATARACT EXTRACTION     Debridement to right arm with MRSA infection     4 years ago   GALLBLADDER SURGERY     Per records from [place] 07/07/2019   Procedure: PACEMAKER IMPLANT;  Surgeon: [name], MD;  Location: [place] CV LAB;  Service: Cardiovascular;  Laterality: N/A;   TEE WITHOUT CARDIOVERSION N/A 12/26/2018    Procedure: TRANSESOPHAGEAL ECHOCARDIOGRAM (TEE);  Surgeon: Pixie Casino, MD;  Location: [facility] ENDOSCOPY;  Service: Cardiovascular;  Laterality: N/A;   Family History  Problem Relation Age of Onset   Heart failure Mother    Hyperlipidemia Mother    Hypertension Son    Hyperlipidemia Son    Arthritis Son    Arthritis Daughter    Hypertension Son    Social History   Socioeconomic History   Marital status: Widowed    Spouse name: Not on file   Number of children: Not on file   Years of education: Not on file   Highest education level: Not on file  Occupational History   Not on file  Tobacco Use   Smoking status: Former    Years: 1.00    Types: Cigarettes, Cigars  Quit date: 07/31/1953    Years since quitting: 68.1   Smokeless tobacco: Never   Tobacco comments:    Quit at age 57  Vaping Use   Vaping Use: Never used  Substance and Sexual Activity   Alcohol use: Never   Drug use: Never   Sexual activity: Not Currently  Other Topics Concern   Not on file  Social History Narrative   Social History      Diet?       Do you drink/eat things with caffeine? yes      Marital status?          married                          What year were you married? 1957      Do you live in a house, apartment, assisted living, condo, trailer, etc.? home      Is it one or more stories? 1      How many persons live in your home? 4      Do you have any pets in your home? (please list) yes- 3 dogs, 1 cat      Highest level of education completed? 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           no                           Type & how often?      Advanced Directives      Do you have a living will? yes      Do you have a DNR form?                                  If not, do you want to discuss one? no      Do you have signed POA/HPOA for forms? yes      Functional Status      Do you have difficulty bathing or dressing  yourself? no      Do you have difficulty preparing food or eating? no      Do you have difficulty managing your medications? no      Do you have difficulty managing your finances? no      Do you have difficulty affording your medications? Yes xarelto   Social Determinants of Health   Financial Resource Strain: Not on file  Food Insecurity: Not on file  Transportation Needs: Not on file  Physical Activity: Not on file  Stress: Not on file  Social Connections: Not on file    Tobacco Counseling Counseling given: Not Answered Tobacco comments: Quit at age 22   Clinical Intake:                 [place] of Daily Living In your present state of health, do you have any difficulty performing the following activities: 03/25/2021  Hearing? N  Vision? N  Difficulty concentrating or making decisions? N  Walking or climbing stairs? N  Dressing or bathing? N  Some recent data might be hidden    Patient Care Team: [name], NP as PCP - General (Geriatric Medicine) [name], MD as PCP -  Cardiology (Cardiology) Center, [facility] Endoscopy (Gastroenterology) [name], MD as Consulting Physician  Indicate any recent Medical Services you may have received from other than [facility] providers in the past year (date may be approximate).     Assessment:   This is a routine wellness examination for [facility].  Hearing/Vision screen Hearing Screening - Comments:: Patient has no hearing problems Vision Screening - Comments:: Patient has no vision problems. Patient [facility].  Dietary issues and exercise activities discussed:     Goals Addressed   None    Depression Screen PHQ 2/9 Scores 08/25/2021 02/25/2021 08/23/2020 04/08/2020 08/20/2019 11/20/2018 08/12/2018  PHQ - 2 Score 0 0 0 0 0 0 0    Fall Risk Fall Risk  08/25/2021 06/10/2021 05/13/2021 04/06/2021 03/16/2021  Falls in the past year? 0 0 1 1 1   Number falls in past  yr: 0 0 1 1 1   Injury with Fall? 0 0 1 0 0  Risk for fall due to : No Fall Risks No Fall Risks - History of fall(s) History of fall(s)  Follow up Falls evaluation completed Falls evaluation completed Falls evaluation completed Falls evaluation completed Falls evaluation completed    Alpha:  Any stairs in or around the home? No  If so, are there any without handrails? No  Home free of loose throw rugs in walkways, pet beds, electrical cords, etc? Yes  Adequate lighting in your home to reduce risk of falls? Yes   ASSISTIVE DEVICES UTILIZED TO PREVENT FALLS:  Life alert? No  Use of a cane, walker or w/c? Yes  Grab bars in the bathroom? Yes  Shower chair or bench in shower? Yes  Elevated toilet seat or a handicapped toilet? Yes   TIMED UP AND GO:  Was the test performed? No .    Cognitive Function: MMSE - Mini Mental State Exam 08/12/2018  Orientation to time 4  Orientation to Place 3  Registration 3  Attention/ Calculation 1  Recall 1  Language- name 2 objects 2  Language- repeat 1  Language- follow 3 step command 3  Language- read & follow direction 0  Write a sentence 0  Copy design 0  Total score 18     6CIT Screen 08/25/2021 08/23/2020 08/20/2019  What Year? 0 points 0 points 0 points  What month? 0 points 0 points 0 points  What time? 0 points 0 points 0 points  Count back from 20 0 points 0 points 0 points  Months in reverse 4 points 0 points 4 points  Repeat phrase 4 points 2 points 8 points  Total Score 8 2 12     Immunizations Immunization History  Administered Date(s) Administered   Fluad Quad(high Dose 65+) 04/18/2019, 05/13/2021   Influenza, High Dose Seasonal PF 05/08/2018, 07/19/2020   Influenza-Unspecified 05/15/2017   PFIZER(Purple Top)SARS-COV-2 Vaccination 08/14/2019, 09/04/2019, 07/19/2020, 03/04/2021   Pneumococcal Polysaccharide-23 07/21/2019   Tdap 07/31/2013   Zoster Recombinat (Shingrix) 08/12/2018     TDAP status: Up to date  Flu Vaccine status: Up to date  Pneumococcal vaccine status: Up to date  Covid-19 vaccine status: Completed vaccines  Qualifies for Shingles Vaccine? Yes   Zostavax completed No   Shingrix Completed?: No.    Education has been provided regarding the importance of this vaccine. Patient has been advised to call insurance company to determine out of pocket expense if they have not yet received this vaccine. Advised may also receive vaccine at local  pharmacy or Health Dept. Verbalized acceptance and understanding.  Screening Tests Health Maintenance  Topic Date Due   OPHTHALMOLOGY EXAM  Never done   Zoster Vaccines- Shingrix (2 of 2) 10/07/2018   Pneumonia Vaccine 67+ Years old (2 - PCV) 07/20/2020   COVID-19 Vaccine (5 - Booster for Pfizer series) 04/29/2021   HEMOGLOBIN A1C  08/28/2021   FOOT EXAM  05/13/2022   TETANUS/TDAP  08/01/2023   INFLUENZA VACCINE  Completed   HPV VACCINES  Aged Out    Health Maintenance  Health Maintenance Due  Topic Date Due   OPHTHALMOLOGY EXAM  Never done   Zoster Vaccines- Shingrix (2 of 2) 10/07/2018   Pneumonia Vaccine 44+ Years old (2 - PCV) 07/20/2020   COVID-19 Vaccine (5 - Booster for [place] series) 04/29/2021    Colorectal cancer screening: No longer required.   Lung Cancer Screening: (Low Dose CT Chest recommended if Age 41-80 years, 30 pack-year currently smoking OR have quit w/in 15years.) does not qualify.   Lung Cancer Screening Referral: na  Additional Screening:  Hepatitis C Screening: does not qualify  Vision Screening: Recommended annual ophthalmology exams for early detection of glaucoma and other disorders of the eye. Is the patient up to date with their annual eye exam?  Yes  Who is the provider or what is the name of the office in which the patient attends annual eye exams? [facility] If pt is not established with a provider, would they like to be referred to a provider to establish care? No  .   Dental Screening: Recommended annual dental exams for proper oral hygiene  Community Resource Referral / Chronic Care Management: CRR required this visit?  No   CCM required this visit?  No      Plan:     I have personally reviewed and noted the following in the patients chart:   Medical and social history Use of alcohol, tobacco or illicit drugs  Current medications and supplements including opioid prescriptions. Patient is not currently taking opioid prescriptions. Functional ability and status Nutritional status Physical activity Advanced directives List of other physicians Hospitalizations, surgeries, and ER visits in previous 12 months Vitals Screenings to include cognitive, depression, and falls Referrals and appointments  In addition, I have reviewed and discussed with patient certain preventive protocols, quality metrics, and best practice recommendations. A written personalized care plan for preventive services as well as general preventive health recommendations were provided to patient.     [name], NP   08/25/2021     Virtual Visit via Telephone Note  I connected withNAME@ on 08/25/21 at 11:00 AM EST by telephone and verified that I am speaking with the correct person using two identifiers.  Location: Patient: home Provider: [place]    I discussed the limitations, risks, security and privacy concerns of performing an evaluation and management service by telephone and the availability of in person appointments. I also discussed with the patient that there may be a patient responsible charge related to this service. The patient expressed understanding and agreed to proceed.   I discussed the assessment and treatment plan with the patient. The patient was provided an opportunity to ask questions and all were answered. The patient agreed with the plan and demonstrated an understanding of the instructions.   The patient was advised to call  back or seek an in-person evaluation if the symptoms worsen or if the condition fails to improve as anticipated.  I provided 17 minutes of non-face-to-face  time during this encounter.  Carlos American. Harle Battiest Avs printed and mailed

## 2021-08-25 NOTE — Telephone Encounter (Signed)
[name], saadeh are scheduled for a virtual visit with your provider today.    Just as we do with appointments in the office, we must obtain your consent to participate.  Your consent will be active for this visit and any virtual visit you may have with one of our providers in the next 365 days.    If you have a MyChart account, I can also send a copy of this consent to you electronically.  All virtual visits are billed to your insurance company just like a traditional visit in the office.  As this is a virtual visit, video technology does not allow for your provider to perform a traditional examination.  This may limit your provider's ability to fully assess your condition.  If your provider identifies any concerns that need to be evaluated in person or the need to arrange testing such as labs, EKG, etc, we will make arrangements to do so.    Although advances in technology are sophisticated, we cannot ensure that it will always work on either your end or our end.  If the connection with a video visit is poor, we may have to switch to a telephone visit.  With either a video or telephone visit, we are not always able to ensure that we have a secure connection.   I need to obtain your verbal consent now.   Are you willing to proceed with your visit today?   Jose Patel has provided verbal consent on 08/25/2021 for a virtual visit (video or telephone).   Carroll Kinds, CMA 08/25/2021  11:14 AM

## 2021-08-26 ENCOUNTER — Encounter: Payer: Medicare HMO | Admitting: Nurse Practitioner

## 2021-08-29 ENCOUNTER — Encounter: Payer: Self-pay | Admitting: Nurse Practitioner

## 2021-08-29 ENCOUNTER — Other Ambulatory Visit: Payer: Self-pay

## 2021-08-29 ENCOUNTER — Ambulatory Visit (INDEPENDENT_AMBULATORY_CARE_PROVIDER_SITE_OTHER): Payer: Medicare HMO | Admitting: Nurse Practitioner

## 2021-08-29 VITALS — BP 122/78 | HR 71 | Temp 97.3°F | Ht 69.0 in | Wt 162.0 lb

## 2021-08-29 DIAGNOSIS — D6869 Other thrombophilia: Secondary | ICD-10-CM

## 2021-08-29 DIAGNOSIS — R69 Illness, unspecified: Secondary | ICD-10-CM | POA: Diagnosis not present

## 2021-08-29 DIAGNOSIS — I5030 Unspecified diastolic (congestive) heart failure: Secondary | ICD-10-CM | POA: Diagnosis not present

## 2021-08-29 DIAGNOSIS — I10 Essential (primary) hypertension: Secondary | ICD-10-CM

## 2021-08-29 DIAGNOSIS — H6123 Impacted cerumen, bilateral: Secondary | ICD-10-CM

## 2021-08-29 DIAGNOSIS — I480 Paroxysmal atrial fibrillation: Secondary | ICD-10-CM

## 2021-08-29 DIAGNOSIS — F320 Major depressive disorder, single episode, mild: Secondary | ICD-10-CM | POA: Diagnosis not present

## 2021-08-29 DIAGNOSIS — Z23 Encounter for immunization: Secondary | ICD-10-CM

## 2021-08-29 DIAGNOSIS — N401 Enlarged prostate with lower urinary tract symptoms: Secondary | ICD-10-CM

## 2021-08-29 DIAGNOSIS — E1140 Type 2 diabetes mellitus with diabetic neuropathy, unspecified: Secondary | ICD-10-CM

## 2021-08-29 DIAGNOSIS — R350 Frequency of micturition: Secondary | ICD-10-CM

## 2021-08-29 DIAGNOSIS — F5101 Primary insomnia: Secondary | ICD-10-CM

## 2021-08-29 MED ORDER — TORSEMIDE 20 MG PO TABS: 20.0000 mg | ORAL_TABLET | Freq: Every day | ORAL | 3 refills | Status: DC

## 2021-08-29 MED ORDER — TRAZODONE HCL 50 MG PO TABS
25.0000 mg | ORAL_TABLET | Freq: Every evening | ORAL | 3 refills | Status: DC | PRN
Start: 2021-08-29 — End: 2022-06-02

## 2021-08-29 NOTE — Progress Notes (Signed)
Careteam: Patient Care Team: [name], NP as PCP - General (Geriatric Medicine) [name], MD as PCP - Cardiology (Cardiology) Center, [facility] Endoscopy (Gastroenterology) [name], MD as Consulting Physician  PLACE OF SERVICE:  [place] Directive information Does Patient Have a Medical Advance Directive?: Yes, Type of Advance Directive: [place];Living will, Does patient want to make changes to medical advance directive?: No - Patient declined  Allergies  Allergen Reactions   Aricept [Donepezil] Diarrhea, Nausea And Vomiting and Other (See Comments)    Cannot tolerate- bradycardia, also   Tropicamide Nausea And Vomiting    Used to dilate eyes (might be this- was switched to something this was tolerable, after this)   Adhesive [Tape] Rash    Chief Complaint  Patient presents with   Medical Management of Chronic Issues    6 month follow-up. Patient denies receiving any vaccines since last visit. Discuss need for PCV, coivd boosters, and a1c or post pone if patient refuses. Examine area of concern on the back of head x 5 months. Here with daughter , [name]      HPI: Patient is a 88 y.o. male here for follow up CHF exacerbation and 6 months medical management.  Pt had covid twice and CHF worsen each time. Between August and October of 2022.  Pt had left arm swelling, was not able to walk very far, due to severe SOB. Still has some SOB but significantly improved. Saw Amy NP and was changed from lasix to torsemide. Swelling has significantly improved.   Able to walk without a walker or assistance, now able to do activities of daily living (toileting, dressing, and bath).   A fib, On Xarelto-no issues with bleeding.   Insomnia - no longer taking trazodone, and takes melatonin and tylenol PM  Cyst at the back of neck- pt used tomato skin on area and has significantly improved.  States cyst is much smaller now.   Review of Systems:   Review of Systems  Constitutional:  Negative for chills, fever and weight loss.  Respiratory:  Positive for sputum production and wheezing. Negative for cough and shortness of breath.        Some wheezing when ambulating, sputum production when laying on right side.   Cardiovascular:  Negative for chest pain, palpitations and leg swelling.  Gastrointestinal:  Negative for abdominal pain, constipation, diarrhea, heartburn, nausea and vomiting.  Genitourinary: Negative.   Neurological:  Negative for dizziness, tingling, tremors, weakness and headaches.  Psychiatric/Behavioral:  The patient does not have insomnia.    Past Medical History:  Diagnosis Date   A-fib ([place])    Anticoagulated on Coumadin    Per [facility]    Aortic regurgitation    Aortic stenosis    CHF (congestive heart failure) ([place])    Coronary artery disease    COVID    CVA (cerebral vascular accident) ([place])    hx of CVA noted on CT from 02/19/18   Diabetes mellitus, type 2 ([place])    Gastro-esophageal reflux disease without esophagitis    Per records from previous provider, [facility]   Heart attack [facility])    History of CT scan of head 02/19/2018   Per records from previous provider, [facility]. Chronic changes small vessel disease   History of ECG    03/26/14- Sinus Tachycardia @ 107 bmp, QRS 78 msec, QT 298 msec, QTc 361 msec.  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 [facility])    Per records from previous provider, [facility]    MI (myocardial infarction) [facility])    Per records from [place]    Mitral regurgitation    Mitral stenosis    Other intervertebral disc degeneration, lumbar region    Per records from previous provider,  [facility]   Radiculopathy, lumbar region    Per records from previous provider, [facility]   Sinus bradycardia    Per records from [place]    Sleep apnea    Transient ischemic attack    10 to 12 years ago   Typical atrial flutter [facility])    Per records from [place]    Past Surgical History:  Procedure Laterality Date   [place] N/A 12/26/2018   Procedure: CARDIOVERSION;  Surgeon: Pixie Casino, MD;  Location: [place];  Service: Cardiovascular;  Laterality: N/A;   CARDIOVERSION N/A 05/14/2019   Procedure: CARDIOVERSION;  Surgeon: [name], MD;  [facility] ENDOS[facility];  Service: Cardiovascular;  Laterality: N/A;   CATARACT EXTRACTION     Debridement to right arm with MRSA infection     4 years ago   GALLBLADDER SURGERY     Per records from [place] 07/07/2019   Procedure: PACEMAKER IMPLANT;  Surgeon: [name], MD;  Location: [place] CV LAB;  Service: Cardiovascular;  Laterality: N/A;   TEE WITHOUT CARDIOVERSION N/A 12/26/2018   Procedure: TRANSESOPHAGEAL ECHOCARDIOGRAM (TEE);  Surgeon: Pixie Casino, MD;  Location: [facility] ENDOSCOPY;  Service: Cardiovascular;  Laterality: N/A;   Social History:   reports that he quit smoking about 68 years ago. His smoking use included cigarettes and cigars. He has never used smokeless tobacco. He reports that he does not drink alcohol and does not use drugs.  Family History  Problem Relation Age of Onset   Heart failure Mother    Hyperlipidemia Mother    Hypertension Son    Hyperlipidemia Son    Arthritis Son    Arthritis Daughter    Hypertension Son     Medications: Patient's Medications  New Prescriptions   TRAZODONE (DESYREL) 50 MG TABLET    Take 0.5-1 tablets (25-50 mg total) by mouth at bedtime as needed for sleep.  Previous Medications   AMIODARONE (PACERONE)  200 MG TABLET    Take 1 tablet (200 mg total) by mouth daily.   AMOXICILLIN (AMOXIL) 500 MG CAPSULE    Take 2,000 mg by mouth See admin instructions. Take 2,000 mg by mouth one hour prior to dental appointment   CHOLECALCIFEROL (VITAMIN D3) 50 MCG (2000 UT) TABS    Take 2,000 Units by mouth daily with breakfast.   HYDRALAZINE (APRESOLINE) 25 MG TABLET    Take 1 tablet (25 mg total) by mouth 2 (two) times daily.   LOSARTAN (COZAAR) 100 MG TABLET    TAKE ONE TABLET BY MOUTH ONE TIME DAILY   MELATONIN 5 MG TABS    Take 5 mg by mouth at bedtime as needed (for sleep).    MEMANTINE (NAMENDA) 10 MG TABLET    TAKE ONE TABLET BY MOUTH TWICE DAILY   METOPROLOL  SUCCINATE (TOPROL-XL) 50 MG 24 HR TABLET    Take 1 tablet (50 mg total) by mouth daily.   MULTIPLE VITAMIN (MULTIVITAMIN) TABLET    Take 1 tablet by mouth daily.   POTASSIUM CHLORIDE (KLOR-CON) 10 MEQ TABLET    Take 1 tablet (10 mEq total) by mouth daily.   RIVAROXABAN (XARELTO) 20 MG TABS TABLET    TAKE ONE TABLET BY MOUTH ONE TIME DAILY with supper   TAMSULOSIN (FLOMAX) 0.4 MG CAPS CAPSULE    TAKE ONE CAPSULE BY MOUTH ONE TIME DAILY  Modified Medications   Modified Medication Previous Medication   TORSEMIDE (DEMADEX) 20 MG TABLET torsemide (DEMADEX) 20 MG tablet      Take 1 tablet (20 mg total) by mouth daily.    Take 1 tablet (20 mg total) by mouth daily.  Discontinued Medications   TRAZODONE (DESYREL) 50 MG TABLET    Take 1/2 to One (25-63m)  tablet by mouth once daily at bedtime for sleep.    Physical Exam:  Vitals:   08/29/21 1416  BP: 122/78  Pulse: 71  Temp: (!) 97.3 F (36.3 C)  TempSrc: Temporal  SpO2: 99%  Weight: 162 lb (73.5 kg)  Height: _0  (1.753 m)   Body mass index is 23.92 kg/m. Wt Readings from Last 3 Encounters:  08/29/21 162 lb (73.5 kg)  06/10/21 165 lb (74.8 kg)  05/13/21 164 lb (74.4 kg)    Physical Exam Constitutional:      General: He is not in acute distress.    Appearance: Normal appearance. He  is normal weight. He is not ill-appearing.  HENT:     Right Ear: Tympanic membrane and external ear normal. There is impacted cerumen.     Left Ear: Tympanic membrane and external ear normal. There is impacted cerumen.     Nose: Nose normal.  Cardiovascular:     Rate and Rhythm: Normal rate and regular rhythm.     Pulses: Normal pulses.     Heart sounds: Normal heart sounds.  Pulmonary:     Effort: Pulmonary effort is normal.     Breath sounds: Normal breath sounds.  Abdominal:     General: Abdomen is flat. Bowel sounds are normal.  Musculoskeletal:        General: No swelling. Normal range of motion.  Neurological:     Mental Status: He is alert and oriented to person, place, and time.  Psychiatric:        Mood and Affect: Mood normal.        Behavior: Behavior normal.        Thought Content: Thought content normal.        Judgment: Judgment normal.    Labs reviewed: Basic Metabolic Panel: Recent Labs    11/01/20 1049 02/25/21 1521 03/25/21 0409 03/26/21 0403 03/27/21 0436 04/06/21 1558  NA 142   < >  --  138 138 140  K 4.1   < >  --  3.3* 4.6 4.7  CL 104   < >  --  104 106 103  CO2 25   < >  --  _1 GLUCOSE 106*   < >  --  110* 101* 196*  BUN 22   < >  --  32* 38* 35*  CREATININE 1.11   < >  --  1.30* 1.23 1.49*  CALCIUM 8.4*   < >  --  8.5* 8.3* 8.9  TSH 1.800  --  1.852  --   --   --    < > =  values in this interval not displayed.   Liver Function Tests: Recent Labs    11/01/20 1049 02/25/21 1521 03/26/21 0403 03/27/21 0436 04/06/21 1558  AST 31   < > 154* 231* 57*  ALT 51*   < > 186* 344* 125*  ALKPHOS 103  --  88 80  --   BILITOT 0.6   < > 1.0 0.8 0.8  PROT 6.7   < > 5.9* 5.7* 6.4  ALBUMIN 3.7  --  2.8* 2.7*  --    < > = values in this interval not displayed.   No results for input(s): LIPASE, AMYLASE in the last 8760 hours. No results for input(s): AMMONIA in the last 8760 hours. CBC: Recent Labs    03/26/21 0403 03/27/21 0436  04/06/21 1558  WBC 6.9 6.5 7.0  NEUTROABS 5.0 4.1 4,844  HGB 13.8 13.7 14.8  HCT 40.3 41.6 47.0  MCV 93.3 93.9 97.1  PLT 228 207 227   Lipid Panel: Recent Labs    11/01/20 1049  CHOL 179  HDL 33*  LDLCALC 124*  TRIG 122  CHOLHDL 5.4*   TSH: Recent Labs    11/01/20 1049 03/25/21 0409  TSH 1.800 1.852   A1C: Lab Results  Component Value Date   HGBA1C 5.7 (H) 02/25/2021     Assessment/Plan  1. Diastolic congestive heart failure, unspecified HF chronicity ([place]) - Last visit pt had CHF exacerbation with swelling of arms, SOB, difficulty walking. Started on Torsemide. Needs refills this visit.  - torsemide (DEMADEX) 20 MG tablet; Take 1 tablet (20 mg total) by mouth daily.  Dispense: 90 tablet; Refill: 3 - CMP with eGFR([facility]) ordered.  -Need up to date lab work to check kidney function and electrolytes -overall euvolemic and CHF is stable . Has a pacemaker . Will continue to monitor.   2. Type 2 diabetes mellitus with diabetic neuropathy, without long-term current use of insulin ([place]) -Will get up to date labs to check to check stability. Unable to get previous lab work due to being severely ill.  - Hemoglobin A1c will be done this visit.  - Will follow up with results. -Encouraged dietary compliance, routine foot care/monitoring and to keep up with diabetic eye exams through ophthalmology.   3. Depression, major, single episode, mild (HCC) -Pt states he does not have depression and is not taking trazodone.  -Will continue to monitor.  4. Paroxysmal atrial fibrillation (HCC) - Stable without acute issues, rate controlled on amiodarone and metoprolol on xarelto for anticoagulation. No issues, no bleeding concerns.   5. Benign prostatic hyperplasia with urinary frequency -No issues with dysuria, frequency, or urgency, continues on flomax for symptom management.  -Stable BPH  6. Essential hypertension -Blood pressure is stable in office and at home.  - pt is on  metoprolol 50 mg QD, Hydralazine BID and losartan QD -Labs ordered:  - CMP with eGFR([facility]) - CBC with Differential/Platelet  7. Acquired thrombophilia ([place]) -No issues, stable -On xarelto 65m QD  8. Primary insomnia Pt was taking tylenol PM and melatonin because Trazodone 547mwas too strong when pt was going through covid and CHF exacerbation.  - Dose reduced to : traZODone (DESYREL) 50 MG tablet; Take 0.5-1 tablets (25-50 mg total) by mouth at bedtime as needed for sleep.  Dispense: 30 tablet; Refill: 3   9. Cerumen Impaction bilateral ears -caused some difficulty hearing -Some cerumen removal in office via grabbers  -Patient's daughter will continue removing at home.  - Will recheck next  visit.  Next appt: Return in about 6 months (around 02/26/2022) for routine follow up.  Carlos American. Harle Battiest I personally was present during the history, physical exam and medical decision-making activities of this service and have verified that the service and findings are accurately documented in the students note  [facility] [phone]

## 2021-08-30 LAB — CBC WITH DIFFERENTIAL/PLATELET
Absolute Monocytes: 638 cells/uL (ref 200–950)
Basophils Absolute: 44 cells/uL (ref 0–200)
Basophils Relative: 0.4 %
Eosinophils Absolute: 297 cells/uL (ref 15–500)
Eosinophils Relative: 2.7 %
HCT: 43.3 % (ref 38.5–50.0)
Hemoglobin: 14.2 g/dL (ref 13.2–17.1)
Lymphs Abs: 2618 cells/uL (ref 850–3900)
MCH: 31.8 pg (ref 27.0–33.0)
MCHC: 32.8 g/dL (ref 32.0–36.0)
MCV: 97.1 fL (ref 80.0–100.0)
MPV: 10.8 fL (ref 7.5–12.5)
Monocytes Relative: 5.8 %
Neutro Abs: 7403 cells/uL (ref 1500–7800)
Neutrophils Relative %: 67.3 %
Platelets: 306 10*3/uL (ref 140–400)
RBC: 4.46 10*6/uL (ref 4.20–5.80)
RDW: 13.2 % (ref 11.0–15.0)
Total Lymphocyte: 23.8 %
WBC: 11 10*3/uL — ABNORMAL HIGH (ref 3.8–10.8)

## 2021-08-30 LAB — COMPLETE METABOLIC PANEL WITH GFR
AG Ratio: 1.3 (calc) (ref 1.0–2.5)
ALT: 36 U/L (ref 9–46)
AST: 43 U/L — ABNORMAL HIGH (ref 10–35)
Albumin: 4 g/dL (ref 3.6–5.1)
Alkaline phosphatase (APISO): 109 U/L (ref 35–144)
BUN/Creatinine Ratio: 18 (calc) (ref 6–22)
BUN: 33 mg/dL — ABNORMAL HIGH (ref 7–25)
CO2: 35 mmol/L — ABNORMAL HIGH (ref 20–32)
Calcium: 9.2 mg/dL (ref 8.6–10.3)
Chloride: 102 mmol/L (ref 98–110)
Creat: 1.82 mg/dL — ABNORMAL HIGH (ref 0.70–1.22)
Globulin: 3 g/dL (calc) (ref 1.9–3.7)
Glucose, Bld: 104 mg/dL — ABNORMAL HIGH (ref 65–99)
Potassium: 4.5 mmol/L (ref 3.5–5.3)
Sodium: 142 mmol/L (ref 135–146)
Total Bilirubin: 0.6 mg/dL (ref 0.2–1.2)
Total Protein: 7 g/dL (ref 6.1–8.1)
eGFR: 36 mL/min/{1.73_m2} — ABNORMAL LOW (ref >=60)

## 2021-08-30 LAB — HEMOGLOBIN A1C
Hgb A1c MFr Bld: 6 % of total Hgb — ABNORMAL HIGH (ref <5.7)
Mean Plasma Glucose: 126 mg/dL
eAG (mmol/L): 7 mmol/L

## 2021-08-31 ENCOUNTER — Encounter: Payer: Self-pay | Admitting: Nurse Practitioner

## 2021-09-01 ENCOUNTER — Other Ambulatory Visit: Payer: Self-pay | Admitting: Nurse Practitioner

## 2021-09-01 DIAGNOSIS — I5030 Unspecified diastolic (congestive) heart failure: Secondary | ICD-10-CM

## 2021-09-02 ENCOUNTER — Other Ambulatory Visit: Payer: Self-pay

## 2021-09-02 DIAGNOSIS — I5030 Unspecified diastolic (congestive) heart failure: Secondary | ICD-10-CM

## 2021-09-02 DIAGNOSIS — M6281 Muscle weakness (generalized): Secondary | ICD-10-CM | POA: Diagnosis not present

## 2021-09-02 DIAGNOSIS — I5033 Acute on chronic diastolic (congestive) heart failure: Secondary | ICD-10-CM | POA: Diagnosis not present

## 2021-09-02 DIAGNOSIS — I69354 Hemiplegia and hemiparesis following cerebral infarction affecting left non-dominant side: Secondary | ICD-10-CM | POA: Diagnosis not present

## 2021-09-30 DIAGNOSIS — I69354 Hemiplegia and hemiparesis following cerebral infarction affecting left non-dominant side: Secondary | ICD-10-CM | POA: Diagnosis not present

## 2021-09-30 DIAGNOSIS — M6281 Muscle weakness (generalized): Secondary | ICD-10-CM | POA: Diagnosis not present

## 2021-09-30 DIAGNOSIS — I5033 Acute on chronic diastolic (congestive) heart failure: Secondary | ICD-10-CM | POA: Diagnosis not present

## 2021-10-03 ENCOUNTER — Other Ambulatory Visit: Payer: Self-pay | Admitting: Cardiovascular Disease

## 2021-10-03 ENCOUNTER — Other Ambulatory Visit: Payer: Self-pay | Admitting: Nurse Practitioner

## 2021-10-03 DIAGNOSIS — F0390 Unspecified dementia without behavioral disturbance: Secondary | ICD-10-CM

## 2021-10-06 ENCOUNTER — Other Ambulatory Visit: Payer: Medicare HMO

## 2021-10-06 ENCOUNTER — Other Ambulatory Visit: Payer: Self-pay

## 2021-10-06 DIAGNOSIS — I5030 Unspecified diastolic (congestive) heart failure: Secondary | ICD-10-CM | POA: Diagnosis not present

## 2021-10-07 LAB — BASIC METABOLIC PANEL WITH GFR
BUN: 20 mg/dL (ref 7–25)
CO2: 30 mmol/L (ref 20–32)
Calcium: 8.9 mg/dL (ref 8.6–10.3)
Chloride: 106 mmol/L (ref 98–110)
Creat: 1.18 mg/dL (ref 0.70–1.22)
Glucose, Bld: 111 mg/dL (ref 65–139)
Potassium: 3.9 mmol/L (ref 3.5–5.3)
Sodium: 145 mmol/L (ref 135–146)
eGFR: 60 mL/min/{1.73_m2} (ref >=60)

## 2021-10-17 ENCOUNTER — Ambulatory Visit (INDEPENDENT_AMBULATORY_CARE_PROVIDER_SITE_OTHER): Payer: Medicare HMO

## 2021-10-17 DIAGNOSIS — I495 Sick sinus syndrome: Secondary | ICD-10-CM | POA: Diagnosis not present

## 2021-10-19 LAB — CUP PACEART REMOTE DEVICE CHECK
Battery Remaining Longevity: 136 mo
Battery Voltage: 3.01 V
Brady Statistic AP VP Percent: 0.08 %
Brady Statistic AP VS Percent: 99.88 %
Brady Statistic AS VP Percent: 0 %
Brady Statistic AS VS Percent: 0.04 %
Brady Statistic RA Percent Paced: 99.96 %
Brady Statistic RV Percent Paced: 0.08 %
Date Time Interrogation Session: 20230321145054
Implantable Lead Implant Date: 20201207
Implantable Lead Implant Date: 20201207
Implantable Lead Location: 753859
Implantable Lead Location: 753860
Implantable Lead Model: 5076
Implantable Lead Model: 5076
Implantable Pulse Generator Implant Date: 20201207
Lead Channel Impedance Value: 361 Ohm
Lead Channel Impedance Value: 399 Ohm
Lead Channel Impedance Value: 475 Ohm
Lead Channel Impedance Value: 475 Ohm
Lead Channel Pacing Threshold Amplitude: 0.625 V
Lead Channel Pacing Threshold Amplitude: 0.875 V
Lead Channel Pacing Threshold Pulse Width: 0.4 ms
Lead Channel Pacing Threshold Pulse Width: 0.4 ms
Lead Channel Sensing Intrinsic Amplitude: 2.875 mV
Lead Channel Sensing Intrinsic Amplitude: 2.875 mV
Lead Channel Sensing Intrinsic Amplitude: 26.875 mV
Lead Channel Sensing Intrinsic Amplitude: 26.875 mV
Lead Channel Setting Pacing Amplitude: 1.5 V
Lead Channel Setting Pacing Amplitude: 2.5 V
Lead Channel Setting Pacing Pulse Width: 0.4 ms
Lead Channel Setting Sensing Sensitivity: 1.2 mV

## 2021-10-31 DIAGNOSIS — I5033 Acute on chronic diastolic (congestive) heart failure: Secondary | ICD-10-CM | POA: Diagnosis not present

## 2021-10-31 DIAGNOSIS — I69354 Hemiplegia and hemiparesis following cerebral infarction affecting left non-dominant side: Secondary | ICD-10-CM | POA: Diagnosis not present

## 2021-10-31 DIAGNOSIS — M6281 Muscle weakness (generalized): Secondary | ICD-10-CM | POA: Diagnosis not present

## 2021-11-01 NOTE — Progress Notes (Signed)
Remote pacemaker transmission.   

## 2021-11-30 DIAGNOSIS — M6281 Muscle weakness (generalized): Secondary | ICD-10-CM | POA: Diagnosis not present

## 2021-11-30 DIAGNOSIS — I69354 Hemiplegia and hemiparesis following cerebral infarction affecting left non-dominant side: Secondary | ICD-10-CM | POA: Diagnosis not present

## 2021-11-30 DIAGNOSIS — I5033 Acute on chronic diastolic (congestive) heart failure: Secondary | ICD-10-CM | POA: Diagnosis not present

## 2021-12-12 ENCOUNTER — Other Ambulatory Visit: Payer: Self-pay | Admitting: Nurse Practitioner

## 2021-12-12 DIAGNOSIS — I480 Paroxysmal atrial fibrillation: Secondary | ICD-10-CM

## 2021-12-12 DIAGNOSIS — I10 Essential (primary) hypertension: Secondary | ICD-10-CM

## 2021-12-12 DIAGNOSIS — N401 Enlarged prostate with lower urinary tract symptoms: Secondary | ICD-10-CM

## 2021-12-12 NOTE — Telephone Encounter (Signed)
Patient has request refill on medications Amiodarone, Losartan, and Tamsulosin. Patient medications all refilled 05/13/2021. Patient medications have warning. Medications pend and sent to PCP Dewaine Oats Carlos American, NP for approval.  ?

## 2021-12-31 DIAGNOSIS — I5033 Acute on chronic diastolic (congestive) heart failure: Secondary | ICD-10-CM | POA: Diagnosis not present

## 2021-12-31 DIAGNOSIS — M6281 Muscle weakness (generalized): Secondary | ICD-10-CM | POA: Diagnosis not present

## 2021-12-31 DIAGNOSIS — I69354 Hemiplegia and hemiparesis following cerebral infarction affecting left non-dominant side: Secondary | ICD-10-CM | POA: Diagnosis not present

## 2022-01-02 ENCOUNTER — Other Ambulatory Visit: Payer: Self-pay | Admitting: Nurse Practitioner

## 2022-01-02 ENCOUNTER — Other Ambulatory Visit: Payer: Self-pay | Admitting: Cardiovascular Disease

## 2022-01-02 DIAGNOSIS — I480 Paroxysmal atrial fibrillation: Secondary | ICD-10-CM

## 2022-01-17 ENCOUNTER — Ambulatory Visit (INDEPENDENT_AMBULATORY_CARE_PROVIDER_SITE_OTHER): Payer: Medicare HMO

## 2022-01-17 DIAGNOSIS — I495 Sick sinus syndrome: Secondary | ICD-10-CM | POA: Diagnosis not present

## 2022-01-17 LAB — CUP PACEART REMOTE DEVICE CHECK
Battery Remaining Longevity: 133 mo
Battery Voltage: 3.01 V
Brady Statistic AP VP Percent: 0.05 %
Brady Statistic AP VS Percent: 99.85 %
Brady Statistic AS VP Percent: 0 %
Brady Statistic AS VS Percent: 0.09 %
Brady Statistic RA Percent Paced: 99.9 %
Brady Statistic RV Percent Paced: 0.05 %
Date Time Interrogation Session: 20230619185037
Implantable Lead Implant Date: 20201207
Implantable Lead Implant Date: 20201207
Implantable Lead Location: 753859
Implantable Lead Location: 753860
Implantable Lead Model: 5076
Implantable Lead Model: 5076
Implantable Pulse Generator Implant Date: 20201207
Lead Channel Impedance Value: 323 Ohm
Lead Channel Impedance Value: 399 Ohm
Lead Channel Impedance Value: 456 Ohm
Lead Channel Impedance Value: 494 Ohm
Lead Channel Pacing Threshold Amplitude: 0.5 V
Lead Channel Pacing Threshold Amplitude: 1.125 V
Lead Channel Pacing Threshold Pulse Width: 0.4 ms
Lead Channel Pacing Threshold Pulse Width: 0.4 ms
Lead Channel Sensing Intrinsic Amplitude: 2.5 mV
Lead Channel Sensing Intrinsic Amplitude: 2.5 mV
Lead Channel Sensing Intrinsic Amplitude: 31.625 mV
Lead Channel Sensing Intrinsic Amplitude: 31.625 mV
Lead Channel Setting Pacing Amplitude: 1.5 V
Lead Channel Setting Pacing Amplitude: 2.5 V
Lead Channel Setting Pacing Pulse Width: 0.4 ms
Lead Channel Setting Sensing Sensitivity: 1.2 mV

## 2022-01-30 ENCOUNTER — Ambulatory Visit (INDEPENDENT_AMBULATORY_CARE_PROVIDER_SITE_OTHER): Payer: Medicare HMO | Admitting: Nurse Practitioner

## 2022-01-30 ENCOUNTER — Encounter: Payer: Self-pay | Admitting: Nurse Practitioner

## 2022-01-30 ENCOUNTER — Ambulatory Visit: Payer: Medicare HMO | Admitting: Nurse Practitioner

## 2022-01-30 VITALS — BP 122/80 | HR 72 | Temp 97.5°F | Ht 69.0 in | Wt 148.0 lb

## 2022-01-30 DIAGNOSIS — R2681 Unsteadiness on feet: Secondary | ICD-10-CM

## 2022-01-30 DIAGNOSIS — R296 Repeated falls: Secondary | ICD-10-CM | POA: Diagnosis not present

## 2022-01-30 DIAGNOSIS — I5030 Unspecified diastolic (congestive) heart failure: Secondary | ICD-10-CM

## 2022-01-30 DIAGNOSIS — E1140 Type 2 diabetes mellitus with diabetic neuropathy, unspecified: Secondary | ICD-10-CM

## 2022-01-30 DIAGNOSIS — F320 Major depressive disorder, single episode, mild: Secondary | ICD-10-CM | POA: Diagnosis not present

## 2022-01-30 DIAGNOSIS — R69 Illness, unspecified: Secondary | ICD-10-CM | POA: Diagnosis not present

## 2022-01-30 DIAGNOSIS — M6281 Muscle weakness (generalized): Secondary | ICD-10-CM | POA: Diagnosis not present

## 2022-01-30 DIAGNOSIS — I480 Paroxysmal atrial fibrillation: Secondary | ICD-10-CM

## 2022-01-30 DIAGNOSIS — N401 Enlarged prostate with lower urinary tract symptoms: Secondary | ICD-10-CM | POA: Diagnosis not present

## 2022-01-30 DIAGNOSIS — I5033 Acute on chronic diastolic (congestive) heart failure: Secondary | ICD-10-CM | POA: Diagnosis not present

## 2022-01-30 DIAGNOSIS — R350 Frequency of micturition: Secondary | ICD-10-CM

## 2022-01-30 DIAGNOSIS — I69354 Hemiplegia and hemiparesis following cerebral infarction affecting left non-dominant side: Secondary | ICD-10-CM | POA: Diagnosis not present

## 2022-01-30 DIAGNOSIS — I10 Essential (primary) hypertension: Secondary | ICD-10-CM

## 2022-01-30 NOTE — Progress Notes (Unsigned)
Careteam: Patient Care Team: [name], NP as PCP - General (Geriatric Medicine) [name], MD as PCP - Cardiology (Cardiology) Center, [facility] Endoscopy (Gastroenterology) [name], MD as Consulting Physician  PLACE OF SERVICE:  [place] Directive information Does Patient Have a Medical Advance Directive?: Yes, Type of Advance Directive: [place];Living will, Does patient want to make changes to medical advance directive?: No - Patient declined  Allergies  Allergen Reactions   Aricept [Donepezil] Diarrhea, Nausea And Vomiting and Other (See Comments)    Cannot tolerate- bradycardia, also   Tropicamide Nausea And Vomiting    Used to dilate eyes (might be this- was switched to something this was tolerable, after this)   Adhesive [Tape] Rash    Chief Complaint  Patient presents with   Medical Management of Chronic Issues    6 month follow-up. [facility] verified, discuss need for shingrix, additional covid boosters, and eye exam or post pone if patient refuses. Patient also not feeling well per daughter. Patient with increased weakness (increased falls), right thumb pain, and left leg concerns. FYI- patient is not consistence with taking medication, has trouble swallowing at times. Decreased appetite. Here with daughter, [name].      HPI: Patient is a 88 y.o. male for follow up.  Reports overall weak and tired.  Left leg is dragging, he will stumble. When he gets in the car has to have help to lift his foot.  Pt has had hx of stroke and daughter thinks the progression of weakness has exacerbated symptoms.  Daughter would like to have some out patient therapy.     He has had a few falls recently. Off a chair, in the driveway, at the park. Walks with walker in house.   He is having some difficulty swallowing medication, picking and choosing medication.  Daughter is putting heart medication at top so he wont miss those.   Dementia-  looking at stopping namenda because daughter is not sure if it is helping, also pill burden is an issue.   Appetite is poor, has lost weight.   CHF- taking diuretic routinely, if he doesn't shortness of breath much worse.   Review of Systems:  Review of Systems  Constitutional:  Positive for weight loss. Negative for chills and fever.  HENT:  Negative for tinnitus.   Respiratory:  Negative for cough, sputum production and shortness of breath.   Cardiovascular:  Negative for chest pain, palpitations and leg swelling.  Gastrointestinal:  Negative for abdominal pain, constipation, diarrhea and heartburn.  Genitourinary:  Negative for dysuria, frequency and urgency.  Musculoskeletal:  Positive for falls. Negative for back pain, joint pain and myalgias.  Skin: Negative.   Neurological:  Positive for weakness. Negative for dizziness and headaches.  Psychiatric/Behavioral:  Negative for depression and memory loss. The patient does not have insomnia.     Past Medical History:  Diagnosis Date   A-fib ([place])    Anticoagulated on Coumadin    Per records from [facility]    Aortic regurgitation    Aortic stenosis    CHF (congestive heart failure) ([place])    Coronary artery disease    COVID    CVA (cerebral vascular accident) ([place])    hx of CVA noted on CT from 02/19/18   Diabetes mellitus, type 2 ([place])    Gastro-esophageal reflux disease without esophagitis    Per records from previous provider, [facility]   Heart attack (  [place])    History of CT scan of head 02/19/2018   Per records from previous provider, [facility]. Chronic changes small vessel disease   History of ECG    03/26/14- Sinus Tachycardia @ 107 bmp, QRS 78 msec, QT 298 msec, QTc 361 msec. Per records from [place] of Holter monitoring 11/08/2016   Per records from previous provider, [facility]   Hypertension    Malignant  neoplasm of postcricoid region of hypopharynx [place])    Per records from previous provider, [facility]    MI (myocardial infarction) [facility])    Per records from [place]    Mitral regurgitation    Mitral stenosis    Other intervertebral disc degeneration, lumbar region    Per records from previous provider, [facility]   Radiculopathy, lumbar region    Per records from previous provider, [facility]   Sinus bradycardia    Per records from [place]    Sleep apnea    Transient ischemic attack    10 to 12 years ago   Typical atrial flutter [facility])    Per records from [place]    Past Surgical History:  Procedure Laterality Date   [place] N/A 12/26/2018   Procedure: CARDIOVERSION;  Surgeon: Pixie Casino, MD;  Location: [place];  Service: Cardiovascular;  Laterality: N/A;   CARDIOVERSION N/A 05/14/2019   Procedure: CARDIOVERSION;  Surgeon: [name], MD;  [facility]OS[facility];  Service: Cardiovascular;  Laterality: N/A;   CATARACT EXTRACTION     Debridement to right arm with MRSA infection     4 years ago   GALLBLADDER SURGERY     Per records from [place] 07/07/2019   Procedure: PACEMAKER IMPLANT;  Surgeon: [name], MD;  Location: [place] CV LAB;  Service: Cardiovascular;  Laterality: N/A;   TEE WITHOUT CARDIOVERSION N/A 12/26/2018   Procedure: TRANSESOPHAGEAL ECHOCARDIOGRAM (TEE);  Surgeon: Pixie Casino, MD;  Location: [facility] ENDOSCOPY;  Service: Cardiovascular;  Laterality: N/A;   Social History:   reports that he quit smoking about 68 years ago. His smoking use included cigarettes and cigars. He has never used smokeless tobacco. He reports that he does not drink alcohol and does not use drugs.  Family History  Problem Relation Age of Onset   Heart failure Mother     Hyperlipidemia Mother    Hypertension Son    Hyperlipidemia Son    Arthritis Son    Arthritis Daughter    Hypertension Son     Medications: Patient's Medications  New Prescriptions   No medications on file  Previous Medications   AMIODARONE (PACERONE) 200 MG TABLET    TAKE ONE TABLET BY MOUTH ONE TIME DAILY   AMOXICILLIN (AMOXIL) 500 MG CAPSULE    Take 2,000 mg by mouth See admin instructions. Take 2,000 mg by mouth one hour prior to dental appointment   CHOLECALCIFEROL (VITAMIN D3) 50 MCG (2000 UT) TABS    Take 2,000 Units by mouth daily with breakfast.   HYDRALAZINE (APRESOLINE) 25 MG TABLET    Take 1 tablet (25 mg total) by mouth 2 (two) times daily.   LOSARTAN (COZAAR) 100 MG TABLET    TAKE ONE TABLET BY MOUTH ONE TIME DAILY   MELATONIN 5 MG TABS  Take 5 mg by mouth at bedtime as needed (for sleep).    MEMANTINE (NAMENDA) 10 MG TABLET    TAKE ONE TABLET BY MOUTH TWICE DAILY   METOPROLOL SUCCINATE (TOPROL-XL) 50 MG 24 HR TABLET    TAKE ONE TABLET BY MOUTH ONE TIME DAILY   POTASSIUM CHLORIDE (KLOR-CON M) 10 MEQ TABLET    Take 0.5 tablets (5 mEq total) by mouth daily.   RIVAROXABAN (XARELTO) 20 MG TABS TABLET    TAKE 1 TABLET BY MOUTH DAILY WITH SUPPER   TAMSULOSIN (FLOMAX) 0.4 MG CAPS CAPSULE    TAKE ONE CAPSULE BY MOUTH ONE TIME DAILY   TORSEMIDE (DEMADEX) 20 MG TABLET    Take 0.5 tablets (10 mg total) by mouth daily.   TRAZODONE (DESYREL) 50 MG TABLET    Take 0.5-1 tablets (25-50 mg total) by mouth at bedtime as needed for sleep.  Modified Medications   No medications on file  Discontinued Medications   MULTIPLE VITAMIN (MULTIVITAMIN) TABLET    Take 1 tablet by mouth daily.    Physical Exam:  Vitals:   01/30/22 1410  BP: 122/80  Pulse: 72  Temp: (!) 97.5 F (36.4 C)  TempSrc: Temporal  SpO2: 97%  Weight: 148 lb (67.1 kg)  Height: '5\' 9"'$  (1.753 m)   Body mass index is 21.86 kg/m. Wt Readings from Last 3 Encounters:  01/30/22 148 lb (67.1 kg)  08/29/21 162 lb (73.5  kg)  06/10/21 165 lb (74.8 kg)    Physical Exam***  Labs reviewed: Basic Metabolic Panel: Recent Labs    03/25/21 0409 03/26/21 0403 04/06/21 1558 08/29/21 1453 10/06/21 1046  NA  --    < > 140 142 145  K  --    < > 4.7 4.5 3.9  CL  --    < > 103 102 106  CO2  --    < > 28 35* 30  GLUCOSE  --    < > 196* 104* 111  BUN  --    < > 35* 33* 20  CREATININE  --    < > 1.49* 1.82* 1.18  CALCIUM  --    < > 8.9 9.2 8.9  TSH 1.852  --   --   --   --    < > = values in this interval not displayed.   Liver Function Tests: Recent Labs    03/26/21 0403 03/27/21 0436 04/06/21 1558 08/29/21 1453  AST 154* 231* 57* 43*  ALT 186* 344* 125* 36  ALKPHOS 88 80  --   --   BILITOT 1.0 0.8 0.8 0.6  PROT 5.9* 5.7* 6.4 7.0  ALBUMIN 2.8* 2.7*  --   --    No results for input(s): "LIPASE", "AMYLASE" in the last 8760 hours. No results for input(s): "AMMONIA" in the last 8760 hours. CBC: Recent Labs    03/27/21 0436 04/06/21 1558 08/29/21 1453  WBC 6.5 7.0 11.0*  NEUTROABS 4.1 4,844 7,403  HGB 13.7 14.8 14.2  HCT 41.6 47.0 43.3  MCV 93.9 97.1 97.1  PLT 207 227 306   Lipid Panel: No results for input(s): "CHOL", "HDL", "LDLCALC", "TRIG", "CHOLHDL", "LDLDIRECT" in the last 8760 hours. TSH: Recent Labs    03/25/21 0409  TSH 1.852   A1C: Lab Results  Component Value Date   HGBA1C 6.0 (H) 08/29/2021     Assessment/Plan There are no diagnoses linked to this encounter.  No follow-ups on file.: *** Adylynn Hertenstein K. Harle Battiest  [employer]  [facility] [phone]

## 2022-01-31 LAB — HEMOGLOBIN A1C
Hgb A1c MFr Bld: 5.9 % of total Hgb — ABNORMAL HIGH (ref <5.7)
Mean Plasma Glucose: 123 mg/dL
eAG (mmol/L): 6.8 mmol/L

## 2022-01-31 LAB — CBC WITH DIFFERENTIAL/PLATELET
Absolute Monocytes: 559 cells/uL (ref 200–950)
Basophils Absolute: 23 cells/uL (ref 0–200)
Basophils Relative: 0.2 %
Eosinophils Absolute: 182 cells/uL (ref 15–500)
Eosinophils Relative: 1.6 %
HCT: 42.9 % (ref 38.5–50.0)
Hemoglobin: 13.9 g/dL (ref 13.2–17.1)
Lymphs Abs: 1585 cells/uL (ref 850–3900)
MCH: 31 pg (ref 27.0–33.0)
MCHC: 32.4 g/dL (ref 32.0–36.0)
MCV: 95.8 fL (ref 80.0–100.0)
MPV: 10.8 fL (ref 7.5–12.5)
Monocytes Relative: 4.9 %
Neutro Abs: 9052 cells/uL — ABNORMAL HIGH (ref 1500–7800)
Neutrophils Relative %: 79.4 %
Platelets: 266 10*3/uL (ref 140–400)
RBC: 4.48 10*6/uL (ref 4.20–5.80)
RDW: 12.6 % (ref 11.0–15.0)
Total Lymphocyte: 13.9 %
WBC: 11.4 10*3/uL — ABNORMAL HIGH (ref 3.8–10.8)

## 2022-01-31 LAB — COMPLETE METABOLIC PANEL WITH GFR
AG Ratio: 1.3 (calc) (ref 1.0–2.5)
ALT: 26 U/L (ref 9–46)
AST: 34 U/L (ref 10–35)
Albumin: 3.7 g/dL (ref 3.6–5.1)
Alkaline phosphatase (APISO): 197 U/L — ABNORMAL HIGH (ref 35–144)
BUN/Creatinine Ratio: 15 (calc) (ref 6–22)
BUN: 25 mg/dL (ref 7–25)
CO2: 27 mmol/L (ref 20–32)
Calcium: 8.5 mg/dL — ABNORMAL LOW (ref 8.6–10.3)
Chloride: 105 mmol/L (ref 98–110)
Creat: 1.65 mg/dL — ABNORMAL HIGH (ref 0.70–1.22)
Globulin: 2.8 g/dL (calc) (ref 1.9–3.7)
Glucose, Bld: 146 mg/dL — ABNORMAL HIGH (ref 65–99)
Potassium: 4.2 mmol/L (ref 3.5–5.3)
Sodium: 141 mmol/L (ref 135–146)
Total Bilirubin: 0.7 mg/dL (ref 0.2–1.2)
Total Protein: 6.5 g/dL (ref 6.1–8.1)
eGFR: 40 mL/min/{1.73_m2} — ABNORMAL LOW (ref >=60)

## 2022-01-31 LAB — TSH: TSH: 1.65 mIU/L (ref 0.40–4.50)

## 2022-02-02 NOTE — Progress Notes (Signed)
Remote pacemaker transmission.   

## 2022-02-10 ENCOUNTER — Telehealth: Payer: Medicare HMO

## 2022-02-10 DIAGNOSIS — I484 Atypical atrial flutter: Secondary | ICD-10-CM | POA: Diagnosis not present

## 2022-02-10 DIAGNOSIS — I080 Rheumatic disorders of both mitral and aortic valves: Secondary | ICD-10-CM | POA: Diagnosis not present

## 2022-02-10 DIAGNOSIS — I5043 Acute on chronic combined systolic (congestive) and diastolic (congestive) heart failure: Secondary | ICD-10-CM | POA: Diagnosis not present

## 2022-02-10 DIAGNOSIS — I495 Sick sinus syndrome: Secondary | ICD-10-CM | POA: Diagnosis not present

## 2022-02-10 DIAGNOSIS — R69 Illness, unspecified: Secondary | ICD-10-CM | POA: Diagnosis not present

## 2022-02-10 DIAGNOSIS — G4733 Obstructive sleep apnea (adult) (pediatric): Secondary | ICD-10-CM | POA: Diagnosis not present

## 2022-02-10 DIAGNOSIS — M5116 Intervertebral disc disorders with radiculopathy, lumbar region: Secondary | ICD-10-CM | POA: Diagnosis not present

## 2022-02-10 DIAGNOSIS — K219 Gastro-esophageal reflux disease without esophagitis: Secondary | ICD-10-CM | POA: Diagnosis not present

## 2022-02-10 DIAGNOSIS — E1140 Type 2 diabetes mellitus with diabetic neuropathy, unspecified: Secondary | ICD-10-CM | POA: Diagnosis not present

## 2022-02-10 DIAGNOSIS — R1310 Dysphagia, unspecified: Secondary | ICD-10-CM | POA: Diagnosis not present

## 2022-02-10 DIAGNOSIS — R634 Abnormal weight loss: Secondary | ICD-10-CM | POA: Diagnosis not present

## 2022-02-10 DIAGNOSIS — E7800 Pure hypercholesterolemia, unspecified: Secondary | ICD-10-CM | POA: Diagnosis not present

## 2022-02-10 DIAGNOSIS — D6869 Other thrombophilia: Secondary | ICD-10-CM | POA: Diagnosis not present

## 2022-02-10 DIAGNOSIS — Z792 Long term (current) use of antibiotics: Secondary | ICD-10-CM | POA: Diagnosis not present

## 2022-02-10 DIAGNOSIS — I2510 Atherosclerotic heart disease of native coronary artery without angina pectoris: Secondary | ICD-10-CM | POA: Diagnosis not present

## 2022-02-10 DIAGNOSIS — I252 Old myocardial infarction: Secondary | ICD-10-CM | POA: Diagnosis not present

## 2022-02-10 DIAGNOSIS — I77810 Thoracic aortic ectasia: Secondary | ICD-10-CM | POA: Diagnosis not present

## 2022-02-10 DIAGNOSIS — I110 Hypertensive heart disease with heart failure: Secondary | ICD-10-CM | POA: Diagnosis not present

## 2022-02-10 DIAGNOSIS — E559 Vitamin D deficiency, unspecified: Secondary | ICD-10-CM | POA: Diagnosis not present

## 2022-02-10 DIAGNOSIS — R350 Frequency of micturition: Secondary | ICD-10-CM | POA: Diagnosis not present

## 2022-02-10 DIAGNOSIS — I4819 Other persistent atrial fibrillation: Secondary | ICD-10-CM | POA: Diagnosis not present

## 2022-02-10 DIAGNOSIS — N39498 Other specified urinary incontinence: Secondary | ICD-10-CM | POA: Diagnosis not present

## 2022-02-10 DIAGNOSIS — N401 Enlarged prostate with lower urinary tract symptoms: Secondary | ICD-10-CM | POA: Diagnosis not present

## 2022-02-10 NOTE — Telephone Encounter (Signed)
Anderson Malta called and left message. Requesting verbal orders for PT. Evaluation was done today and planning to see the patient 1 time a week for the first two weeks, twice a week for 7 weeks.  Anise Salvo, verbal order given.

## 2022-02-13 DIAGNOSIS — E7800 Pure hypercholesterolemia, unspecified: Secondary | ICD-10-CM | POA: Diagnosis not present

## 2022-02-13 DIAGNOSIS — K219 Gastro-esophageal reflux disease without esophagitis: Secondary | ICD-10-CM | POA: Diagnosis not present

## 2022-02-13 DIAGNOSIS — I484 Atypical atrial flutter: Secondary | ICD-10-CM | POA: Diagnosis not present

## 2022-02-13 DIAGNOSIS — R634 Abnormal weight loss: Secondary | ICD-10-CM | POA: Diagnosis not present

## 2022-02-13 DIAGNOSIS — I77810 Thoracic aortic ectasia: Secondary | ICD-10-CM | POA: Diagnosis not present

## 2022-02-13 DIAGNOSIS — N39498 Other specified urinary incontinence: Secondary | ICD-10-CM | POA: Diagnosis not present

## 2022-02-13 DIAGNOSIS — M5116 Intervertebral disc disorders with radiculopathy, lumbar region: Secondary | ICD-10-CM | POA: Diagnosis not present

## 2022-02-13 DIAGNOSIS — I110 Hypertensive heart disease with heart failure: Secondary | ICD-10-CM | POA: Diagnosis not present

## 2022-02-13 DIAGNOSIS — I4819 Other persistent atrial fibrillation: Secondary | ICD-10-CM | POA: Diagnosis not present

## 2022-02-13 DIAGNOSIS — Z792 Long term (current) use of antibiotics: Secondary | ICD-10-CM | POA: Diagnosis not present

## 2022-02-13 DIAGNOSIS — I5043 Acute on chronic combined systolic (congestive) and diastolic (congestive) heart failure: Secondary | ICD-10-CM | POA: Diagnosis not present

## 2022-02-13 DIAGNOSIS — R1310 Dysphagia, unspecified: Secondary | ICD-10-CM | POA: Diagnosis not present

## 2022-02-13 DIAGNOSIS — G4733 Obstructive sleep apnea (adult) (pediatric): Secondary | ICD-10-CM | POA: Diagnosis not present

## 2022-02-13 DIAGNOSIS — E1140 Type 2 diabetes mellitus with diabetic neuropathy, unspecified: Secondary | ICD-10-CM | POA: Diagnosis not present

## 2022-02-13 DIAGNOSIS — R69 Illness, unspecified: Secondary | ICD-10-CM | POA: Diagnosis not present

## 2022-02-13 DIAGNOSIS — E559 Vitamin D deficiency, unspecified: Secondary | ICD-10-CM | POA: Diagnosis not present

## 2022-02-13 DIAGNOSIS — R350 Frequency of micturition: Secondary | ICD-10-CM | POA: Diagnosis not present

## 2022-02-13 DIAGNOSIS — I2510 Atherosclerotic heart disease of native coronary artery without angina pectoris: Secondary | ICD-10-CM | POA: Diagnosis not present

## 2022-02-13 DIAGNOSIS — I252 Old myocardial infarction: Secondary | ICD-10-CM | POA: Diagnosis not present

## 2022-02-13 DIAGNOSIS — I495 Sick sinus syndrome: Secondary | ICD-10-CM | POA: Diagnosis not present

## 2022-02-13 DIAGNOSIS — I080 Rheumatic disorders of both mitral and aortic valves: Secondary | ICD-10-CM | POA: Diagnosis not present

## 2022-02-13 DIAGNOSIS — N401 Enlarged prostate with lower urinary tract symptoms: Secondary | ICD-10-CM | POA: Diagnosis not present

## 2022-02-13 DIAGNOSIS — D6869 Other thrombophilia: Secondary | ICD-10-CM | POA: Diagnosis not present

## 2022-02-15 ENCOUNTER — Encounter: Payer: Self-pay | Admitting: Nurse Practitioner

## 2022-02-17 NOTE — Telephone Encounter (Signed)
See MyChart messages.

## 2022-02-20 ENCOUNTER — Encounter: Payer: Self-pay | Admitting: Family

## 2022-02-20 ENCOUNTER — Ambulatory Visit (INDEPENDENT_AMBULATORY_CARE_PROVIDER_SITE_OTHER): Payer: Medicare HMO | Admitting: Family

## 2022-02-20 VITALS — BP 140/80 | HR 82 | Temp 97.4°F | Resp 16 | Ht 69.0 in | Wt 162.4 lb

## 2022-02-20 DIAGNOSIS — R8290 Unspecified abnormal findings in urine: Secondary | ICD-10-CM

## 2022-02-20 DIAGNOSIS — I480 Paroxysmal atrial fibrillation: Secondary | ICD-10-CM

## 2022-02-20 DIAGNOSIS — I10 Essential (primary) hypertension: Secondary | ICD-10-CM

## 2022-02-20 DIAGNOSIS — I5030 Unspecified diastolic (congestive) heart failure: Secondary | ICD-10-CM

## 2022-02-20 DIAGNOSIS — R350 Frequency of micturition: Secondary | ICD-10-CM

## 2022-02-20 NOTE — Progress Notes (Signed)
Provider: Uziel Covault FNP-C  [name], NP  Patient Care Team: [name], NP as PCP - General (Geriatric Medicine) [name], MD as PCP - Cardiology (Cardiology) Center, [facility] (Gastroenterology) [name], MD as Consulting Physician  Extended Emergency Contact Information Primary Emergency Contact: Henderson Cloud Address: [address]          [place], [place] [address] Johnnette Litter of [place] Phone: [phone] Mobile Phone: [phone] Relation: Daughter Secondary Emergency Contact: Konnor, Vondrasek II Mobile Phone: [phone] Relation: Son  Code Status: Full Code  Goals of care: Advanced Directive information    02/20/2022   10:44 AM  Advanced Directives  Does Patient Have a Medical Advance Directive? Yes  Type of Paramedic of Story;Living will  Does patient want to make changes to medical advance directive? No - Patient declined  Copy of [place] in Chart? Yes - validated most recent copy scanned in chart (See row information)     Chief Complaint  Patient presents with   Acute Visit    Patient complains of possible UTI.    HPI:  Pt is a 88 y.o. male seen today for an acute visit for evaluation of urine frequency x several days.He is here with Granddaughter who provides additional HPI information.States her mother would like to know if furosemide can be changed to whole pill and given every other day instead of daily due to frequent urination. States has shortness of breath with exertion.   Also would like Home health Aide to assist with bathing.States Home health Physical therapy will be coming in this week.PT from [place].   Would also like to know if Flomax is making him use the bathroom frequently would like it discontinued.Taking Flomax for BPH    Past Medical History:  Diagnosis Date   A-fib ([place])    Anticoagulated on Coumadin    Per records from [facility]  Medicine    Aortic regurgitation    Aortic stenosis    CHF (congestive heart failure) ([place])    Coronary artery disease    COVID    CVA (cerebral vascular accident) ([place])    hx of CVA noted on CT from 02/19/18   Diabetes mellitus, type 2 ([place])    Gastro-esophageal reflux disease without esophagitis    Per records from previous provider, [facility]   Heart attack [facility])    History of CT scan of head 02/19/2018   Per records from previous provider, [facility]. Chronic changes small vessel disease   History of ECG    03/26/14- Sinus Tachycardia @ 107 bmp, QRS 78 msec, QT 298 msec, QTc 361 msec. 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 [facility])    Per records from previous provider, [facility]    MI (myocardial infarction) [facility])    Per records from [place]    Mitral regurgitation    Mitral stenosis    Other intervertebral disc degeneration, lumbar region    Per records from previous provider, [facility]   Radiculopathy, lumbar region    Per records from previous provider, [facility]   Sinus bradycardia    Per records from [place]  Sleep apnea    Transient ischemic attack    10 to 12 years ago   Typical atrial flutter ([place])    Per records from [place]    Past Surgical History:  Procedure Laterality Date   [place] N/A 12/26/2018   Procedure: CARDIOVERSION;  Surgeon: Pixie Casino, MD;  Location: [place] [facility];  Service: Cardiovascular;  Laterality: N/A;   CARDIOVERSION N/A 05/14/2019   Procedure: CARDIOVERSION;  Surgeon: [name], MD;  Location: [facility] ENDOSCOPY;  Service:  Cardiovascular;  Laterality: N/A;   CATARACT EXTRACTION     Debridement to right arm with MRSA infection     4 years ago   GALLBLADDER SURGERY     Per records from [place] 07/07/2019   Procedure: PACEMAKER IMPLANT;  Surgeon: [name], MD;  Location: [place] CV LAB;  Service: Cardiovascular;  Laterality: N/A;   TEE WITHOUT CARDIOVERSION N/A 12/26/2018   Procedure: TRANSESOPHAGEAL ECHOCARDIOGRAM (TEE);  Surgeon: Pixie Casino, MD;  Location: [facility] ENDOSCOPY;  Service: Cardiovascular;  Laterality: N/A;    Allergies  Allergen Reactions   Aricept [Donepezil] Diarrhea, Nausea And Vomiting and Other (See Comments)    Cannot tolerate- bradycardia, also   Tropicamide Nausea And Vomiting    Used to dilate eyes (might be this- was switched to something this was tolerable, after this)   Adhesive [Tape] Rash    Outpatient Encounter Medications as of 02/20/2022  Medication Sig   amiodarone (PACERONE) 200 MG tablet TAKE ONE TABLET BY MOUTH ONE TIME DAILY   amoxicillin (AMOXIL) 500 MG capsule Take 2,000 mg by mouth See admin instructions. Take 2,000 mg by mouth one hour prior to dental appointment   Cholecalciferol (VITAMIN D3) 50 MCG (2000 UT) TABS Take 2,000 Units by mouth daily with breakfast.   losartan (COZAAR) 100 MG tablet TAKE ONE TABLET BY MOUTH ONE TIME DAILY   Melatonin 5 MG TABS Take 5 mg by mouth at bedtime as needed (for sleep).    memantine (NAMENDA) 10 MG tablet TAKE ONE TABLET BY MOUTH TWICE DAILY   metoprolol succinate (TOPROL-XL) 50 MG 24 hr tablet TAKE ONE TABLET BY MOUTH ONE TIME DAILY   potassium chloride (KLOR-CON M) 10 MEQ tablet Take 0.5 tablets (5 mEq total) by mouth daily.   rivaroxaban (XARELTO) 20 MG TABS tablet TAKE 1 TABLET BY MOUTH DAILY WITH SUPPER   tamsulosin (FLOMAX) 0.4 MG CAPS capsule TAKE ONE CAPSULE BY MOUTH ONE TIME DAILY   torsemide (DEMADEX) 20 MG tablet Take 0.5 tablets (10 mg total) by mouth daily.   traZODone  (DESYREL) 50 MG tablet Take 0.5-1 tablets (25-50 mg total) by mouth at bedtime as needed for sleep.   [DISCONTINUED] hydrALAZINE (APRESOLINE) 25 MG tablet Take 1 tablet (25 mg total) by mouth 2 (two) times daily.   Facility-Administered Encounter Medications as of 02/20/2022  Medication   sodium chloride flush (NS) 0.9 % injection 3 mL    Review of Systems  Constitutional:  Negative for appetite change, chills, fatigue and fever.  Respiratory:  Negative for cough, chest tightness and wheezing.        Chronic shortness of breath with exertion   Cardiovascular:  Negative for chest pain, palpitations and leg swelling.  Gastrointestinal:  Negative for abdominal distention, abdominal pain, constipation, diarrhea, nausea and vomiting.  Genitourinary:  Positive for frequency. Negative for difficulty urinating, dysuria, flank pain, hematuria and urgency.  Musculoskeletal:  Positive for  gait problem. Negative for arthralgias and back pain.  Neurological:  Negative for dizziness, light-headedness and headaches.  Psychiatric/Behavioral:  Negative for agitation, confusion and sleep disturbance. The patient is not nervous/anxious.     Immunization History  Administered Date(s) Administered   Fluad Quad(high Dose 65+) 04/18/2019, 05/13/2021   Influenza, High Dose Seasonal PF 05/08/2018, 07/19/2020   Influenza-Unspecified 05/15/2017   PFIZER(Purple Top)SARS-COV-2 Vaccination 08/14/2019, 09/04/2019, 07/19/2020, 03/04/2021   PNEUMOCOCCAL CONJUGATE-20 08/29/2021   Pneumococcal Polysaccharide-23 07/21/2019   Tdap 07/31/2013   Zoster Recombinat (Shingrix) 08/12/2018   Pertinent  Health Maintenance Due  Topic Date Due   OPHTHALMOLOGY EXAM  10/29/2021   INFLUENZA VACCINE  02/28/2022   FOOT EXAM  05/13/2022   HEMOGLOBIN A1C  08/02/2022      05/13/2021    3:22 PM 06/10/2021    2:11 PM 08/25/2021   11:05 AM 01/30/2022    2:07 PM 02/20/2022   10:44 AM  Fall Risk  Falls in the past year? 1 0 0 1 1   Was there an injury with Fall? 1 0 0 0 1  Fall Risk Category Calculator 3 0 0 2 3  Fall Risk Category High Low Low Moderate High  Patient Fall Risk Level High fall risk Low fall risk Low fall risk Moderate fall risk High fall risk  Patient at Risk for Falls Due to  No Fall Risks No Fall Risks History of fall(s);Impaired balance/gait;Impaired mobility History of fall(s);Impaired balance/gait  Fall risk Follow up Falls evaluation completed Falls evaluation completed Falls evaluation completed Falls evaluation completed Falls evaluation completed;Education provided;Falls prevention discussed   Functional Status Survey:    Vitals:   02/20/22 1037  BP: 140/80  Pulse: 82  Resp: 16  Temp: (!) 97.4 F (36.3 C)  SpO2: 94%  Weight: 162 lb 6.4 oz (73.7 kg)  Height: '5\' 9"'$  (1.753 m)   Body mass index is 23.98 kg/m. Physical Exam Vitals reviewed.  Constitutional:      General: He is not in acute distress.    Appearance: Normal appearance. He is normal weight. He is not ill-appearing or diaphoretic.  HENT:     Head: Normocephalic.     Nose: Nose normal. No congestion or rhinorrhea.     Mouth/Throat:     Mouth: Mucous membranes are moist.     Pharynx: Oropharynx is clear. No oropharyngeal exudate or posterior oropharyngeal erythema.  Neck:     Vascular: No carotid bruit.  Cardiovascular:     Rate and Rhythm: Normal rate and regular rhythm.     Pulses: Normal pulses.     Heart sounds: Normal heart sounds. No murmur heard.    No friction rub. No gallop.  Pulmonary:     Effort: Pulmonary effort is normal. No respiratory distress.     Breath sounds: Normal breath sounds. No wheezing, rhonchi or rales.  Chest:     Chest wall: No tenderness.  Abdominal:     General: Bowel sounds are normal. There is no distension.     Palpations: Abdomen is soft. There is no mass.     Tenderness: There is no abdominal tenderness. There is no right CVA tenderness, left CVA tenderness, guarding or rebound.   Musculoskeletal:        General: No swelling or tenderness. Normal range of motion.     Cervical back: Normal range of motion. No rigidity or tenderness.     Right lower leg: No edema.     Left lower leg: No edema.  Comments: Unsteady gait   Lymphadenopathy:     Cervical: No cervical adenopathy.  Skin:    General: Skin is warm and dry.     Coloration: Skin is not pale.     Findings: No erythema or rash.  Neurological:     Mental Status: He is alert and oriented to person, place, and time.     Motor: No weakness.     Gait: Gait abnormal.  Psychiatric:        Mood and Affect: Mood normal.        Speech: Speech normal.        Behavior: Behavior normal.     Labs reviewed: Recent Labs    08/29/21 1453 10/06/21 1046 01/30/22 1433  NA 142 145 141  K 4.5 3.9 4.2  CL 102 106 105  CO2 35* 30 27  GLUCOSE 104* 111 146*  BUN 33* 20 25  CREATININE 1.82* 1.18 1.65*  CALCIUM 9.2 8.9 8.5*   Recent Labs    03/26/21 0403 03/27/21 0436 04/06/21 1558 08/29/21 1453 01/30/22 1433  AST 154* 231* 57* 43* 34  ALT 186* 344* 125* 36 26  ALKPHOS 88 80  --   --   --   BILITOT 1.0 0.8 0.8 0.6 0.7  PROT 5.9* 5.7* 6.4 7.0 6.5  ALBUMIN 2.8* 2.7*  --   --   --    Recent Labs    04/06/21 1558 08/29/21 1453 01/30/22 1433  WBC 7.0 11.0* 11.4*  NEUTROABS 4,844 7,403 9,052*  HGB 14.8 14.2 13.9  HCT 47.0 43.3 42.9  MCV 97.1 97.1 95.8  PLT 227 306 266   Lab Results  Component Value Date   TSH 1.65 01/30/2022   Lab Results  Component Value Date   HGBA1C 5.9 (H) 01/30/2022   Lab Results  Component Value Date   CHOL 179 11/01/2020   HDL 33 (L) 11/01/2020   LDLCALC 124 (H) 11/01/2020   TRIG 122 11/01/2020   CHOLHDL 5.4 (H) 11/01/2020    Significant Diagnostic Results in last 30 days:  No results found.  Assessment/Plan 1. Abnormal urine odor Unable to void during visit  Specimen cup send home with patient then to return specimen to the office for U/A and C/S to rule out  UTI  - Urine Culture; Future  2. Urinary frequency Suspect could be mul factorial since he is on diuretic.and Hx of BPH will rule out UTI  - Urine Culture; Future  3. Paroxysmal atrial fibrillation (HCC) Continue on Rivaroxaban for anticoagulation And Metoprolol for HR controlled.  - Ambulatory referral to [facility] Health: [facility] aide to assist with ADL's.   4. Essential hypertension B/p stable  - continue on Losartan ,metoprolol and amiodarone - Ambulatory referral to [place]  5. Diastolic congestive heart failure, unspecified HF chronicity (HCC) Reports shortness of breath with exertion. - continue on torsemide  - Keep legs elevated when seated to keep swelling down  - check weight at least three times per week and notify provider for any abrupt weight gain > 3 lbs  - Reduce salt intake in diet  - Ambulatory referral to Home Health: [place] to assist with ADL's.    Family/ staff Communication: Reviewed plan of care with patient and granddaughter verbalized understanding.   Labs/tests ordered:  - Urine Culture; Future  Next Appointment: Return if symptoms worsen or fail to improve.   Sandrea Hughs, NP

## 2022-02-20 NOTE — Patient Instructions (Addendum)
-   Keep legs elevated when seated to keep swelling down   - check weight at least three times per week and notify provider for any abrupt weight gain > 3 lbs   - Reduce salt intake in diet   - Please collect urine specimen as directed and bring urine specimen to the office

## 2022-02-22 DIAGNOSIS — E1140 Type 2 diabetes mellitus with diabetic neuropathy, unspecified: Secondary | ICD-10-CM | POA: Diagnosis not present

## 2022-02-22 DIAGNOSIS — I484 Atypical atrial flutter: Secondary | ICD-10-CM | POA: Diagnosis not present

## 2022-02-22 DIAGNOSIS — E7800 Pure hypercholesterolemia, unspecified: Secondary | ICD-10-CM | POA: Diagnosis not present

## 2022-02-22 DIAGNOSIS — E559 Vitamin D deficiency, unspecified: Secondary | ICD-10-CM | POA: Diagnosis not present

## 2022-02-22 DIAGNOSIS — N39498 Other specified urinary incontinence: Secondary | ICD-10-CM | POA: Diagnosis not present

## 2022-02-22 DIAGNOSIS — I4819 Other persistent atrial fibrillation: Secondary | ICD-10-CM | POA: Diagnosis not present

## 2022-02-22 DIAGNOSIS — I77810 Thoracic aortic ectasia: Secondary | ICD-10-CM | POA: Diagnosis not present

## 2022-02-22 DIAGNOSIS — I252 Old myocardial infarction: Secondary | ICD-10-CM | POA: Diagnosis not present

## 2022-02-22 DIAGNOSIS — G4733 Obstructive sleep apnea (adult) (pediatric): Secondary | ICD-10-CM | POA: Diagnosis not present

## 2022-02-22 DIAGNOSIS — M5116 Intervertebral disc disorders with radiculopathy, lumbar region: Secondary | ICD-10-CM | POA: Diagnosis not present

## 2022-02-22 DIAGNOSIS — I495 Sick sinus syndrome: Secondary | ICD-10-CM | POA: Diagnosis not present

## 2022-02-22 DIAGNOSIS — R69 Illness, unspecified: Secondary | ICD-10-CM | POA: Diagnosis not present

## 2022-02-22 DIAGNOSIS — R634 Abnormal weight loss: Secondary | ICD-10-CM | POA: Diagnosis not present

## 2022-02-22 DIAGNOSIS — N401 Enlarged prostate with lower urinary tract symptoms: Secondary | ICD-10-CM | POA: Diagnosis not present

## 2022-02-22 DIAGNOSIS — I5043 Acute on chronic combined systolic (congestive) and diastolic (congestive) heart failure: Secondary | ICD-10-CM | POA: Diagnosis not present

## 2022-02-22 DIAGNOSIS — I080 Rheumatic disorders of both mitral and aortic valves: Secondary | ICD-10-CM | POA: Diagnosis not present

## 2022-02-22 DIAGNOSIS — D6869 Other thrombophilia: Secondary | ICD-10-CM | POA: Diagnosis not present

## 2022-02-22 DIAGNOSIS — K219 Gastro-esophageal reflux disease without esophagitis: Secondary | ICD-10-CM | POA: Diagnosis not present

## 2022-02-22 DIAGNOSIS — I110 Hypertensive heart disease with heart failure: Secondary | ICD-10-CM | POA: Diagnosis not present

## 2022-02-22 DIAGNOSIS — Z792 Long term (current) use of antibiotics: Secondary | ICD-10-CM | POA: Diagnosis not present

## 2022-02-22 DIAGNOSIS — R1310 Dysphagia, unspecified: Secondary | ICD-10-CM | POA: Diagnosis not present

## 2022-02-22 DIAGNOSIS — I2510 Atherosclerotic heart disease of native coronary artery without angina pectoris: Secondary | ICD-10-CM | POA: Diagnosis not present

## 2022-02-22 DIAGNOSIS — R350 Frequency of micturition: Secondary | ICD-10-CM | POA: Diagnosis not present

## 2022-02-24 ENCOUNTER — Other Ambulatory Visit: Payer: Self-pay | Admitting: Family

## 2022-02-24 DIAGNOSIS — N39498 Other specified urinary incontinence: Secondary | ICD-10-CM | POA: Diagnosis not present

## 2022-02-24 DIAGNOSIS — K219 Gastro-esophageal reflux disease without esophagitis: Secondary | ICD-10-CM | POA: Diagnosis not present

## 2022-02-24 DIAGNOSIS — I2510 Atherosclerotic heart disease of native coronary artery without angina pectoris: Secondary | ICD-10-CM | POA: Diagnosis not present

## 2022-02-24 DIAGNOSIS — E7800 Pure hypercholesterolemia, unspecified: Secondary | ICD-10-CM | POA: Diagnosis not present

## 2022-02-24 DIAGNOSIS — I495 Sick sinus syndrome: Secondary | ICD-10-CM | POA: Diagnosis not present

## 2022-02-24 DIAGNOSIS — R634 Abnormal weight loss: Secondary | ICD-10-CM | POA: Diagnosis not present

## 2022-02-24 DIAGNOSIS — M5116 Intervertebral disc disorders with radiculopathy, lumbar region: Secondary | ICD-10-CM | POA: Diagnosis not present

## 2022-02-24 DIAGNOSIS — E1140 Type 2 diabetes mellitus with diabetic neuropathy, unspecified: Secondary | ICD-10-CM | POA: Diagnosis not present

## 2022-02-24 DIAGNOSIS — R8290 Unspecified abnormal findings in urine: Secondary | ICD-10-CM | POA: Diagnosis not present

## 2022-02-24 DIAGNOSIS — D6869 Other thrombophilia: Secondary | ICD-10-CM | POA: Diagnosis not present

## 2022-02-24 DIAGNOSIS — R1310 Dysphagia, unspecified: Secondary | ICD-10-CM | POA: Diagnosis not present

## 2022-02-24 DIAGNOSIS — R69 Illness, unspecified: Secondary | ICD-10-CM | POA: Diagnosis not present

## 2022-02-24 DIAGNOSIS — E559 Vitamin D deficiency, unspecified: Secondary | ICD-10-CM | POA: Diagnosis not present

## 2022-02-24 DIAGNOSIS — I4819 Other persistent atrial fibrillation: Secondary | ICD-10-CM | POA: Diagnosis not present

## 2022-02-24 DIAGNOSIS — I252 Old myocardial infarction: Secondary | ICD-10-CM | POA: Diagnosis not present

## 2022-02-24 DIAGNOSIS — N401 Enlarged prostate with lower urinary tract symptoms: Secondary | ICD-10-CM | POA: Diagnosis not present

## 2022-02-24 DIAGNOSIS — G4733 Obstructive sleep apnea (adult) (pediatric): Secondary | ICD-10-CM | POA: Diagnosis not present

## 2022-02-24 DIAGNOSIS — R350 Frequency of micturition: Secondary | ICD-10-CM

## 2022-02-24 DIAGNOSIS — I484 Atypical atrial flutter: Secondary | ICD-10-CM | POA: Diagnosis not present

## 2022-02-24 DIAGNOSIS — Z792 Long term (current) use of antibiotics: Secondary | ICD-10-CM | POA: Diagnosis not present

## 2022-02-24 DIAGNOSIS — I110 Hypertensive heart disease with heart failure: Secondary | ICD-10-CM | POA: Diagnosis not present

## 2022-02-24 DIAGNOSIS — I5043 Acute on chronic combined systolic (congestive) and diastolic (congestive) heart failure: Secondary | ICD-10-CM | POA: Diagnosis not present

## 2022-02-24 DIAGNOSIS — I77810 Thoracic aortic ectasia: Secondary | ICD-10-CM | POA: Diagnosis not present

## 2022-02-24 DIAGNOSIS — I080 Rheumatic disorders of both mitral and aortic valves: Secondary | ICD-10-CM | POA: Diagnosis not present

## 2022-02-26 LAB — URINE CULTURE
MICRO NUMBER:: 13712440
SPECIMEN QUALITY:: ADEQUATE

## 2022-03-01 DIAGNOSIS — R69 Illness, unspecified: Secondary | ICD-10-CM | POA: Diagnosis not present

## 2022-03-01 DIAGNOSIS — K219 Gastro-esophageal reflux disease without esophagitis: Secondary | ICD-10-CM | POA: Diagnosis not present

## 2022-03-01 DIAGNOSIS — I110 Hypertensive heart disease with heart failure: Secondary | ICD-10-CM | POA: Diagnosis not present

## 2022-03-01 DIAGNOSIS — R1310 Dysphagia, unspecified: Secondary | ICD-10-CM | POA: Diagnosis not present

## 2022-03-01 DIAGNOSIS — I5043 Acute on chronic combined systolic (congestive) and diastolic (congestive) heart failure: Secondary | ICD-10-CM | POA: Diagnosis not present

## 2022-03-01 DIAGNOSIS — I080 Rheumatic disorders of both mitral and aortic valves: Secondary | ICD-10-CM | POA: Diagnosis not present

## 2022-03-01 DIAGNOSIS — E1140 Type 2 diabetes mellitus with diabetic neuropathy, unspecified: Secondary | ICD-10-CM | POA: Diagnosis not present

## 2022-03-01 DIAGNOSIS — E7800 Pure hypercholesterolemia, unspecified: Secondary | ICD-10-CM | POA: Diagnosis not present

## 2022-03-01 DIAGNOSIS — I484 Atypical atrial flutter: Secondary | ICD-10-CM | POA: Diagnosis not present

## 2022-03-01 DIAGNOSIS — R350 Frequency of micturition: Secondary | ICD-10-CM | POA: Diagnosis not present

## 2022-03-01 DIAGNOSIS — Z792 Long term (current) use of antibiotics: Secondary | ICD-10-CM | POA: Diagnosis not present

## 2022-03-01 DIAGNOSIS — I77810 Thoracic aortic ectasia: Secondary | ICD-10-CM | POA: Diagnosis not present

## 2022-03-01 DIAGNOSIS — N39498 Other specified urinary incontinence: Secondary | ICD-10-CM | POA: Diagnosis not present

## 2022-03-01 DIAGNOSIS — R634 Abnormal weight loss: Secondary | ICD-10-CM | POA: Diagnosis not present

## 2022-03-01 DIAGNOSIS — M5116 Intervertebral disc disorders with radiculopathy, lumbar region: Secondary | ICD-10-CM | POA: Diagnosis not present

## 2022-03-01 DIAGNOSIS — I4819 Other persistent atrial fibrillation: Secondary | ICD-10-CM | POA: Diagnosis not present

## 2022-03-01 DIAGNOSIS — N401 Enlarged prostate with lower urinary tract symptoms: Secondary | ICD-10-CM | POA: Diagnosis not present

## 2022-03-01 DIAGNOSIS — I2510 Atherosclerotic heart disease of native coronary artery without angina pectoris: Secondary | ICD-10-CM | POA: Diagnosis not present

## 2022-03-01 DIAGNOSIS — G4733 Obstructive sleep apnea (adult) (pediatric): Secondary | ICD-10-CM | POA: Diagnosis not present

## 2022-03-01 DIAGNOSIS — I252 Old myocardial infarction: Secondary | ICD-10-CM | POA: Diagnosis not present

## 2022-03-01 DIAGNOSIS — E559 Vitamin D deficiency, unspecified: Secondary | ICD-10-CM | POA: Diagnosis not present

## 2022-03-01 DIAGNOSIS — D6869 Other thrombophilia: Secondary | ICD-10-CM | POA: Diagnosis not present

## 2022-03-01 DIAGNOSIS — I495 Sick sinus syndrome: Secondary | ICD-10-CM | POA: Diagnosis not present

## 2022-03-02 DIAGNOSIS — I5033 Acute on chronic diastolic (congestive) heart failure: Secondary | ICD-10-CM | POA: Diagnosis not present

## 2022-03-02 DIAGNOSIS — I69354 Hemiplegia and hemiparesis following cerebral infarction affecting left non-dominant side: Secondary | ICD-10-CM | POA: Diagnosis not present

## 2022-03-02 DIAGNOSIS — M6281 Muscle weakness (generalized): Secondary | ICD-10-CM | POA: Diagnosis not present

## 2022-03-03 ENCOUNTER — Ambulatory Visit: Payer: Medicare HMO | Admitting: Nurse Practitioner

## 2022-03-03 DIAGNOSIS — I2510 Atherosclerotic heart disease of native coronary artery without angina pectoris: Secondary | ICD-10-CM | POA: Diagnosis not present

## 2022-03-03 DIAGNOSIS — R634 Abnormal weight loss: Secondary | ICD-10-CM | POA: Diagnosis not present

## 2022-03-03 DIAGNOSIS — I252 Old myocardial infarction: Secondary | ICD-10-CM | POA: Diagnosis not present

## 2022-03-03 DIAGNOSIS — E559 Vitamin D deficiency, unspecified: Secondary | ICD-10-CM | POA: Diagnosis not present

## 2022-03-03 DIAGNOSIS — I080 Rheumatic disorders of both mitral and aortic valves: Secondary | ICD-10-CM | POA: Diagnosis not present

## 2022-03-03 DIAGNOSIS — Z792 Long term (current) use of antibiotics: Secondary | ICD-10-CM | POA: Diagnosis not present

## 2022-03-03 DIAGNOSIS — M5116 Intervertebral disc disorders with radiculopathy, lumbar region: Secondary | ICD-10-CM | POA: Diagnosis not present

## 2022-03-03 DIAGNOSIS — E1140 Type 2 diabetes mellitus with diabetic neuropathy, unspecified: Secondary | ICD-10-CM | POA: Diagnosis not present

## 2022-03-03 DIAGNOSIS — I484 Atypical atrial flutter: Secondary | ICD-10-CM | POA: Diagnosis not present

## 2022-03-03 DIAGNOSIS — N401 Enlarged prostate with lower urinary tract symptoms: Secondary | ICD-10-CM | POA: Diagnosis not present

## 2022-03-03 DIAGNOSIS — E7800 Pure hypercholesterolemia, unspecified: Secondary | ICD-10-CM | POA: Diagnosis not present

## 2022-03-03 DIAGNOSIS — R350 Frequency of micturition: Secondary | ICD-10-CM | POA: Diagnosis not present

## 2022-03-03 DIAGNOSIS — I495 Sick sinus syndrome: Secondary | ICD-10-CM | POA: Diagnosis not present

## 2022-03-03 DIAGNOSIS — I4819 Other persistent atrial fibrillation: Secondary | ICD-10-CM | POA: Diagnosis not present

## 2022-03-03 DIAGNOSIS — G4733 Obstructive sleep apnea (adult) (pediatric): Secondary | ICD-10-CM | POA: Diagnosis not present

## 2022-03-03 DIAGNOSIS — D6869 Other thrombophilia: Secondary | ICD-10-CM | POA: Diagnosis not present

## 2022-03-03 DIAGNOSIS — R69 Illness, unspecified: Secondary | ICD-10-CM | POA: Diagnosis not present

## 2022-03-03 DIAGNOSIS — I5043 Acute on chronic combined systolic (congestive) and diastolic (congestive) heart failure: Secondary | ICD-10-CM | POA: Diagnosis not present

## 2022-03-03 DIAGNOSIS — R1310 Dysphagia, unspecified: Secondary | ICD-10-CM | POA: Diagnosis not present

## 2022-03-03 DIAGNOSIS — I110 Hypertensive heart disease with heart failure: Secondary | ICD-10-CM | POA: Diagnosis not present

## 2022-03-03 DIAGNOSIS — N39498 Other specified urinary incontinence: Secondary | ICD-10-CM | POA: Diagnosis not present

## 2022-03-03 DIAGNOSIS — K219 Gastro-esophageal reflux disease without esophagitis: Secondary | ICD-10-CM | POA: Diagnosis not present

## 2022-03-03 DIAGNOSIS — I77810 Thoracic aortic ectasia: Secondary | ICD-10-CM | POA: Diagnosis not present

## 2022-03-06 ENCOUNTER — Encounter: Payer: Self-pay | Admitting: Cardiovascular Disease

## 2022-03-06 ENCOUNTER — Ambulatory Visit (INDEPENDENT_AMBULATORY_CARE_PROVIDER_SITE_OTHER): Payer: Medicare HMO | Admitting: Cardiovascular Disease

## 2022-03-06 VITALS — BP 102/56 | HR 66 | Ht 69.0 in | Wt 155.6 lb

## 2022-03-06 DIAGNOSIS — E119 Type 2 diabetes mellitus without complications: Secondary | ICD-10-CM

## 2022-03-06 DIAGNOSIS — I351 Nonrheumatic aortic (valve) insufficiency: Secondary | ICD-10-CM | POA: Diagnosis not present

## 2022-03-06 DIAGNOSIS — I480 Paroxysmal atrial fibrillation: Secondary | ICD-10-CM

## 2022-03-06 DIAGNOSIS — Z5181 Encounter for therapeutic drug level monitoring: Secondary | ICD-10-CM | POA: Diagnosis not present

## 2022-03-06 DIAGNOSIS — E7800 Pure hypercholesterolemia, unspecified: Secondary | ICD-10-CM

## 2022-03-06 DIAGNOSIS — I77810 Thoracic aortic ectasia: Secondary | ICD-10-CM

## 2022-03-06 DIAGNOSIS — Z79899 Other long term (current) drug therapy: Secondary | ICD-10-CM

## 2022-03-06 DIAGNOSIS — D6869 Other thrombophilia: Secondary | ICD-10-CM

## 2022-03-06 DIAGNOSIS — I10 Essential (primary) hypertension: Secondary | ICD-10-CM

## 2022-03-06 DIAGNOSIS — I5042 Chronic combined systolic (congestive) and diastolic (congestive) heart failure: Secondary | ICD-10-CM | POA: Diagnosis not present

## 2022-03-06 DIAGNOSIS — I495 Sick sinus syndrome: Secondary | ICD-10-CM

## 2022-03-06 DIAGNOSIS — I471 Supraventricular tachycardia: Secondary | ICD-10-CM

## 2022-03-06 DIAGNOSIS — Z950 Presence of cardiac pacemaker: Secondary | ICD-10-CM | POA: Diagnosis not present

## 2022-03-06 DIAGNOSIS — G4733 Obstructive sleep apnea (adult) (pediatric): Secondary | ICD-10-CM

## 2022-03-06 NOTE — Patient Instructions (Signed)
Medication Instructions:  No changes *If you need a refill on your cardiac medications before your next appointment, please call your pharmacy*   Lab Work: None ordered If you have labs (blood work) drawn today and your tests are completely normal, you will receive your results only by: MyChart Message (if you have MyChart) OR A paper copy in the mail If you have any lab test that is abnormal or we need to change your treatment, we will call you to review the results.   Testing/Procedures: None ordered   Follow-Up: At [facility], you and your health needs are our priority.  As part of our continuing mission to provide you with exceptional heart care, we have created designated Provider Care Teams.  These Care Teams include your primary Cardiologist (physician) and Advanced Practice Providers (APPs -  Physician Assistants and Nurse Practitioners) who all work together to provide you with the care you need, when you need it.  We recommend signing up for the patient portal called "MyChart".  Sign up information is provided on this After Visit Summary.  MyChart is used to connect with patients for Virtual Visits (Telemedicine).  Patients are able to view lab/test results, encounter notes, upcoming appointments, etc.  Non-urgent messages can be sent to your provider as well.   To learn more about what you can do with MyChart, go to [email or web address].    Your next appointment:   6 month(s)  The format for your next appointment:   In Person  Provider:   Mihai Croitoru, MD {    Important Information About Sugar       
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
## 2022-03-07 ENCOUNTER — Encounter: Payer: Self-pay | Admitting: Cardiovascular Disease

## 2022-03-08 DIAGNOSIS — I2510 Atherosclerotic heart disease of native coronary artery without angina pectoris: Secondary | ICD-10-CM | POA: Diagnosis not present

## 2022-03-08 DIAGNOSIS — I4819 Other persistent atrial fibrillation: Secondary | ICD-10-CM | POA: Diagnosis not present

## 2022-03-08 DIAGNOSIS — R1310 Dysphagia, unspecified: Secondary | ICD-10-CM | POA: Diagnosis not present

## 2022-03-08 DIAGNOSIS — I5043 Acute on chronic combined systolic (congestive) and diastolic (congestive) heart failure: Secondary | ICD-10-CM | POA: Diagnosis not present

## 2022-03-08 DIAGNOSIS — I77810 Thoracic aortic ectasia: Secondary | ICD-10-CM | POA: Diagnosis not present

## 2022-03-08 DIAGNOSIS — I080 Rheumatic disorders of both mitral and aortic valves: Secondary | ICD-10-CM | POA: Diagnosis not present

## 2022-03-08 DIAGNOSIS — D6869 Other thrombophilia: Secondary | ICD-10-CM | POA: Diagnosis not present

## 2022-03-08 DIAGNOSIS — R634 Abnormal weight loss: Secondary | ICD-10-CM | POA: Diagnosis not present

## 2022-03-08 DIAGNOSIS — R350 Frequency of micturition: Secondary | ICD-10-CM | POA: Diagnosis not present

## 2022-03-08 DIAGNOSIS — I110 Hypertensive heart disease with heart failure: Secondary | ICD-10-CM | POA: Diagnosis not present

## 2022-03-08 DIAGNOSIS — I484 Atypical atrial flutter: Secondary | ICD-10-CM | POA: Diagnosis not present

## 2022-03-08 DIAGNOSIS — I252 Old myocardial infarction: Secondary | ICD-10-CM | POA: Diagnosis not present

## 2022-03-08 DIAGNOSIS — R69 Illness, unspecified: Secondary | ICD-10-CM | POA: Diagnosis not present

## 2022-03-08 DIAGNOSIS — Z792 Long term (current) use of antibiotics: Secondary | ICD-10-CM | POA: Diagnosis not present

## 2022-03-08 DIAGNOSIS — E1140 Type 2 diabetes mellitus with diabetic neuropathy, unspecified: Secondary | ICD-10-CM | POA: Diagnosis not present

## 2022-03-08 DIAGNOSIS — N401 Enlarged prostate with lower urinary tract symptoms: Secondary | ICD-10-CM | POA: Diagnosis not present

## 2022-03-08 DIAGNOSIS — M5116 Intervertebral disc disorders with radiculopathy, lumbar region: Secondary | ICD-10-CM | POA: Diagnosis not present

## 2022-03-08 DIAGNOSIS — E7800 Pure hypercholesterolemia, unspecified: Secondary | ICD-10-CM | POA: Diagnosis not present

## 2022-03-08 DIAGNOSIS — N39498 Other specified urinary incontinence: Secondary | ICD-10-CM | POA: Diagnosis not present

## 2022-03-08 DIAGNOSIS — I495 Sick sinus syndrome: Secondary | ICD-10-CM | POA: Diagnosis not present

## 2022-03-08 DIAGNOSIS — G4733 Obstructive sleep apnea (adult) (pediatric): Secondary | ICD-10-CM | POA: Diagnosis not present

## 2022-03-08 DIAGNOSIS — E559 Vitamin D deficiency, unspecified: Secondary | ICD-10-CM | POA: Diagnosis not present

## 2022-03-08 DIAGNOSIS — K219 Gastro-esophageal reflux disease without esophagitis: Secondary | ICD-10-CM | POA: Diagnosis not present

## 2022-03-10 ENCOUNTER — Ambulatory Visit: Payer: Medicare HMO | Admitting: Nurse Practitioner

## 2022-03-10 DIAGNOSIS — R69 Illness, unspecified: Secondary | ICD-10-CM | POA: Diagnosis not present

## 2022-03-10 DIAGNOSIS — E7800 Pure hypercholesterolemia, unspecified: Secondary | ICD-10-CM | POA: Diagnosis not present

## 2022-03-10 DIAGNOSIS — M5116 Intervertebral disc disorders with radiculopathy, lumbar region: Secondary | ICD-10-CM | POA: Diagnosis not present

## 2022-03-10 DIAGNOSIS — I484 Atypical atrial flutter: Secondary | ICD-10-CM | POA: Diagnosis not present

## 2022-03-10 DIAGNOSIS — N401 Enlarged prostate with lower urinary tract symptoms: Secondary | ICD-10-CM | POA: Diagnosis not present

## 2022-03-10 DIAGNOSIS — D6869 Other thrombophilia: Secondary | ICD-10-CM | POA: Diagnosis not present

## 2022-03-10 DIAGNOSIS — I080 Rheumatic disorders of both mitral and aortic valves: Secondary | ICD-10-CM | POA: Diagnosis not present

## 2022-03-10 DIAGNOSIS — E1140 Type 2 diabetes mellitus with diabetic neuropathy, unspecified: Secondary | ICD-10-CM | POA: Diagnosis not present

## 2022-03-10 DIAGNOSIS — R1310 Dysphagia, unspecified: Secondary | ICD-10-CM | POA: Diagnosis not present

## 2022-03-10 DIAGNOSIS — I2510 Atherosclerotic heart disease of native coronary artery without angina pectoris: Secondary | ICD-10-CM | POA: Diagnosis not present

## 2022-03-10 DIAGNOSIS — I77810 Thoracic aortic ectasia: Secondary | ICD-10-CM | POA: Diagnosis not present

## 2022-03-10 DIAGNOSIS — N39498 Other specified urinary incontinence: Secondary | ICD-10-CM | POA: Diagnosis not present

## 2022-03-10 DIAGNOSIS — R350 Frequency of micturition: Secondary | ICD-10-CM | POA: Diagnosis not present

## 2022-03-10 DIAGNOSIS — I495 Sick sinus syndrome: Secondary | ICD-10-CM | POA: Diagnosis not present

## 2022-03-10 DIAGNOSIS — E559 Vitamin D deficiency, unspecified: Secondary | ICD-10-CM | POA: Diagnosis not present

## 2022-03-10 DIAGNOSIS — K219 Gastro-esophageal reflux disease without esophagitis: Secondary | ICD-10-CM | POA: Diagnosis not present

## 2022-03-10 DIAGNOSIS — I252 Old myocardial infarction: Secondary | ICD-10-CM | POA: Diagnosis not present

## 2022-03-10 DIAGNOSIS — R634 Abnormal weight loss: Secondary | ICD-10-CM | POA: Diagnosis not present

## 2022-03-10 DIAGNOSIS — Z792 Long term (current) use of antibiotics: Secondary | ICD-10-CM | POA: Diagnosis not present

## 2022-03-10 DIAGNOSIS — I5043 Acute on chronic combined systolic (congestive) and diastolic (congestive) heart failure: Secondary | ICD-10-CM | POA: Diagnosis not present

## 2022-03-10 DIAGNOSIS — I4819 Other persistent atrial fibrillation: Secondary | ICD-10-CM | POA: Diagnosis not present

## 2022-03-10 DIAGNOSIS — I110 Hypertensive heart disease with heart failure: Secondary | ICD-10-CM | POA: Diagnosis not present

## 2022-03-10 DIAGNOSIS — G4733 Obstructive sleep apnea (adult) (pediatric): Secondary | ICD-10-CM | POA: Diagnosis not present

## 2022-03-14 DIAGNOSIS — E7800 Pure hypercholesterolemia, unspecified: Secondary | ICD-10-CM | POA: Diagnosis not present

## 2022-03-14 DIAGNOSIS — K219 Gastro-esophageal reflux disease without esophagitis: Secondary | ICD-10-CM | POA: Diagnosis not present

## 2022-03-14 DIAGNOSIS — N401 Enlarged prostate with lower urinary tract symptoms: Secondary | ICD-10-CM | POA: Diagnosis not present

## 2022-03-14 DIAGNOSIS — I495 Sick sinus syndrome: Secondary | ICD-10-CM | POA: Diagnosis not present

## 2022-03-14 DIAGNOSIS — G4733 Obstructive sleep apnea (adult) (pediatric): Secondary | ICD-10-CM | POA: Diagnosis not present

## 2022-03-14 DIAGNOSIS — I77810 Thoracic aortic ectasia: Secondary | ICD-10-CM | POA: Diagnosis not present

## 2022-03-14 DIAGNOSIS — M5116 Intervertebral disc disorders with radiculopathy, lumbar region: Secondary | ICD-10-CM | POA: Diagnosis not present

## 2022-03-14 DIAGNOSIS — E1140 Type 2 diabetes mellitus with diabetic neuropathy, unspecified: Secondary | ICD-10-CM | POA: Diagnosis not present

## 2022-03-14 DIAGNOSIS — I110 Hypertensive heart disease with heart failure: Secondary | ICD-10-CM | POA: Diagnosis not present

## 2022-03-14 DIAGNOSIS — Z792 Long term (current) use of antibiotics: Secondary | ICD-10-CM | POA: Diagnosis not present

## 2022-03-14 DIAGNOSIS — R69 Illness, unspecified: Secondary | ICD-10-CM | POA: Diagnosis not present

## 2022-03-14 DIAGNOSIS — I080 Rheumatic disorders of both mitral and aortic valves: Secondary | ICD-10-CM | POA: Diagnosis not present

## 2022-03-14 DIAGNOSIS — R350 Frequency of micturition: Secondary | ICD-10-CM | POA: Diagnosis not present

## 2022-03-14 DIAGNOSIS — I484 Atypical atrial flutter: Secondary | ICD-10-CM | POA: Diagnosis not present

## 2022-03-14 DIAGNOSIS — R1310 Dysphagia, unspecified: Secondary | ICD-10-CM | POA: Diagnosis not present

## 2022-03-14 DIAGNOSIS — I5043 Acute on chronic combined systolic (congestive) and diastolic (congestive) heart failure: Secondary | ICD-10-CM | POA: Diagnosis not present

## 2022-03-14 DIAGNOSIS — R634 Abnormal weight loss: Secondary | ICD-10-CM | POA: Diagnosis not present

## 2022-03-14 DIAGNOSIS — I252 Old myocardial infarction: Secondary | ICD-10-CM | POA: Diagnosis not present

## 2022-03-14 DIAGNOSIS — I4819 Other persistent atrial fibrillation: Secondary | ICD-10-CM | POA: Diagnosis not present

## 2022-03-14 DIAGNOSIS — I2510 Atherosclerotic heart disease of native coronary artery without angina pectoris: Secondary | ICD-10-CM | POA: Diagnosis not present

## 2022-03-14 DIAGNOSIS — N39498 Other specified urinary incontinence: Secondary | ICD-10-CM | POA: Diagnosis not present

## 2022-03-14 DIAGNOSIS — E559 Vitamin D deficiency, unspecified: Secondary | ICD-10-CM | POA: Diagnosis not present

## 2022-03-14 DIAGNOSIS — D6869 Other thrombophilia: Secondary | ICD-10-CM | POA: Diagnosis not present

## 2022-03-15 DIAGNOSIS — I252 Old myocardial infarction: Secondary | ICD-10-CM | POA: Diagnosis not present

## 2022-03-15 DIAGNOSIS — R69 Illness, unspecified: Secondary | ICD-10-CM | POA: Diagnosis not present

## 2022-03-15 DIAGNOSIS — I110 Hypertensive heart disease with heart failure: Secondary | ICD-10-CM | POA: Diagnosis not present

## 2022-03-15 DIAGNOSIS — I495 Sick sinus syndrome: Secondary | ICD-10-CM | POA: Diagnosis not present

## 2022-03-15 DIAGNOSIS — D6869 Other thrombophilia: Secondary | ICD-10-CM | POA: Diagnosis not present

## 2022-03-15 DIAGNOSIS — Z792 Long term (current) use of antibiotics: Secondary | ICD-10-CM | POA: Diagnosis not present

## 2022-03-15 DIAGNOSIS — G4733 Obstructive sleep apnea (adult) (pediatric): Secondary | ICD-10-CM | POA: Diagnosis not present

## 2022-03-15 DIAGNOSIS — R350 Frequency of micturition: Secondary | ICD-10-CM | POA: Diagnosis not present

## 2022-03-15 DIAGNOSIS — I2510 Atherosclerotic heart disease of native coronary artery without angina pectoris: Secondary | ICD-10-CM | POA: Diagnosis not present

## 2022-03-15 DIAGNOSIS — I484 Atypical atrial flutter: Secondary | ICD-10-CM | POA: Diagnosis not present

## 2022-03-15 DIAGNOSIS — E559 Vitamin D deficiency, unspecified: Secondary | ICD-10-CM | POA: Diagnosis not present

## 2022-03-15 DIAGNOSIS — E7800 Pure hypercholesterolemia, unspecified: Secondary | ICD-10-CM | POA: Diagnosis not present

## 2022-03-15 DIAGNOSIS — N39498 Other specified urinary incontinence: Secondary | ICD-10-CM | POA: Diagnosis not present

## 2022-03-15 DIAGNOSIS — N401 Enlarged prostate with lower urinary tract symptoms: Secondary | ICD-10-CM | POA: Diagnosis not present

## 2022-03-15 DIAGNOSIS — R1310 Dysphagia, unspecified: Secondary | ICD-10-CM | POA: Diagnosis not present

## 2022-03-15 DIAGNOSIS — I080 Rheumatic disorders of both mitral and aortic valves: Secondary | ICD-10-CM | POA: Diagnosis not present

## 2022-03-15 DIAGNOSIS — I5043 Acute on chronic combined systolic (congestive) and diastolic (congestive) heart failure: Secondary | ICD-10-CM | POA: Diagnosis not present

## 2022-03-15 DIAGNOSIS — I4819 Other persistent atrial fibrillation: Secondary | ICD-10-CM | POA: Diagnosis not present

## 2022-03-15 DIAGNOSIS — R634 Abnormal weight loss: Secondary | ICD-10-CM | POA: Diagnosis not present

## 2022-03-15 DIAGNOSIS — M5116 Intervertebral disc disorders with radiculopathy, lumbar region: Secondary | ICD-10-CM | POA: Diagnosis not present

## 2022-03-15 DIAGNOSIS — K219 Gastro-esophageal reflux disease without esophagitis: Secondary | ICD-10-CM | POA: Diagnosis not present

## 2022-03-15 DIAGNOSIS — E1140 Type 2 diabetes mellitus with diabetic neuropathy, unspecified: Secondary | ICD-10-CM | POA: Diagnosis not present

## 2022-03-15 DIAGNOSIS — I77810 Thoracic aortic ectasia: Secondary | ICD-10-CM | POA: Diagnosis not present

## 2022-03-22 DIAGNOSIS — R350 Frequency of micturition: Secondary | ICD-10-CM | POA: Diagnosis not present

## 2022-03-22 DIAGNOSIS — E7800 Pure hypercholesterolemia, unspecified: Secondary | ICD-10-CM | POA: Diagnosis not present

## 2022-03-22 DIAGNOSIS — R1310 Dysphagia, unspecified: Secondary | ICD-10-CM | POA: Diagnosis not present

## 2022-03-22 DIAGNOSIS — I080 Rheumatic disorders of both mitral and aortic valves: Secondary | ICD-10-CM | POA: Diagnosis not present

## 2022-03-22 DIAGNOSIS — I4819 Other persistent atrial fibrillation: Secondary | ICD-10-CM | POA: Diagnosis not present

## 2022-03-22 DIAGNOSIS — N401 Enlarged prostate with lower urinary tract symptoms: Secondary | ICD-10-CM | POA: Diagnosis not present

## 2022-03-22 DIAGNOSIS — E559 Vitamin D deficiency, unspecified: Secondary | ICD-10-CM | POA: Diagnosis not present

## 2022-03-22 DIAGNOSIS — Z792 Long term (current) use of antibiotics: Secondary | ICD-10-CM | POA: Diagnosis not present

## 2022-03-22 DIAGNOSIS — I5043 Acute on chronic combined systolic (congestive) and diastolic (congestive) heart failure: Secondary | ICD-10-CM | POA: Diagnosis not present

## 2022-03-22 DIAGNOSIS — K219 Gastro-esophageal reflux disease without esophagitis: Secondary | ICD-10-CM | POA: Diagnosis not present

## 2022-03-22 DIAGNOSIS — I77810 Thoracic aortic ectasia: Secondary | ICD-10-CM | POA: Diagnosis not present

## 2022-03-22 DIAGNOSIS — I2510 Atherosclerotic heart disease of native coronary artery without angina pectoris: Secondary | ICD-10-CM | POA: Diagnosis not present

## 2022-03-22 DIAGNOSIS — N39498 Other specified urinary incontinence: Secondary | ICD-10-CM | POA: Diagnosis not present

## 2022-03-22 DIAGNOSIS — R634 Abnormal weight loss: Secondary | ICD-10-CM | POA: Diagnosis not present

## 2022-03-22 DIAGNOSIS — I252 Old myocardial infarction: Secondary | ICD-10-CM | POA: Diagnosis not present

## 2022-03-22 DIAGNOSIS — E1140 Type 2 diabetes mellitus with diabetic neuropathy, unspecified: Secondary | ICD-10-CM | POA: Diagnosis not present

## 2022-03-22 DIAGNOSIS — I495 Sick sinus syndrome: Secondary | ICD-10-CM | POA: Diagnosis not present

## 2022-03-22 DIAGNOSIS — G4733 Obstructive sleep apnea (adult) (pediatric): Secondary | ICD-10-CM | POA: Diagnosis not present

## 2022-03-22 DIAGNOSIS — I484 Atypical atrial flutter: Secondary | ICD-10-CM | POA: Diagnosis not present

## 2022-03-22 DIAGNOSIS — M5116 Intervertebral disc disorders with radiculopathy, lumbar region: Secondary | ICD-10-CM | POA: Diagnosis not present

## 2022-03-22 DIAGNOSIS — R69 Illness, unspecified: Secondary | ICD-10-CM | POA: Diagnosis not present

## 2022-03-22 DIAGNOSIS — I110 Hypertensive heart disease with heart failure: Secondary | ICD-10-CM | POA: Diagnosis not present

## 2022-03-22 DIAGNOSIS — D6869 Other thrombophilia: Secondary | ICD-10-CM | POA: Diagnosis not present

## 2022-03-24 DIAGNOSIS — R350 Frequency of micturition: Secondary | ICD-10-CM | POA: Diagnosis not present

## 2022-03-24 DIAGNOSIS — R634 Abnormal weight loss: Secondary | ICD-10-CM | POA: Diagnosis not present

## 2022-03-24 DIAGNOSIS — I5043 Acute on chronic combined systolic (congestive) and diastolic (congestive) heart failure: Secondary | ICD-10-CM | POA: Diagnosis not present

## 2022-03-24 DIAGNOSIS — Z792 Long term (current) use of antibiotics: Secondary | ICD-10-CM | POA: Diagnosis not present

## 2022-03-24 DIAGNOSIS — M5116 Intervertebral disc disorders with radiculopathy, lumbar region: Secondary | ICD-10-CM | POA: Diagnosis not present

## 2022-03-24 DIAGNOSIS — D6869 Other thrombophilia: Secondary | ICD-10-CM | POA: Diagnosis not present

## 2022-03-24 DIAGNOSIS — K219 Gastro-esophageal reflux disease without esophagitis: Secondary | ICD-10-CM | POA: Diagnosis not present

## 2022-03-24 DIAGNOSIS — I2510 Atherosclerotic heart disease of native coronary artery without angina pectoris: Secondary | ICD-10-CM | POA: Diagnosis not present

## 2022-03-24 DIAGNOSIS — G4733 Obstructive sleep apnea (adult) (pediatric): Secondary | ICD-10-CM | POA: Diagnosis not present

## 2022-03-24 DIAGNOSIS — R69 Illness, unspecified: Secondary | ICD-10-CM | POA: Diagnosis not present

## 2022-03-24 DIAGNOSIS — N401 Enlarged prostate with lower urinary tract symptoms: Secondary | ICD-10-CM | POA: Diagnosis not present

## 2022-03-24 DIAGNOSIS — I77810 Thoracic aortic ectasia: Secondary | ICD-10-CM | POA: Diagnosis not present

## 2022-03-24 DIAGNOSIS — N39498 Other specified urinary incontinence: Secondary | ICD-10-CM | POA: Diagnosis not present

## 2022-03-24 DIAGNOSIS — I110 Hypertensive heart disease with heart failure: Secondary | ICD-10-CM | POA: Diagnosis not present

## 2022-03-24 DIAGNOSIS — E559 Vitamin D deficiency, unspecified: Secondary | ICD-10-CM | POA: Diagnosis not present

## 2022-03-24 DIAGNOSIS — I495 Sick sinus syndrome: Secondary | ICD-10-CM | POA: Diagnosis not present

## 2022-03-24 DIAGNOSIS — I484 Atypical atrial flutter: Secondary | ICD-10-CM | POA: Diagnosis not present

## 2022-03-24 DIAGNOSIS — R1310 Dysphagia, unspecified: Secondary | ICD-10-CM | POA: Diagnosis not present

## 2022-03-24 DIAGNOSIS — E7800 Pure hypercholesterolemia, unspecified: Secondary | ICD-10-CM | POA: Diagnosis not present

## 2022-03-24 DIAGNOSIS — I252 Old myocardial infarction: Secondary | ICD-10-CM | POA: Diagnosis not present

## 2022-03-24 DIAGNOSIS — I080 Rheumatic disorders of both mitral and aortic valves: Secondary | ICD-10-CM | POA: Diagnosis not present

## 2022-03-24 DIAGNOSIS — I4819 Other persistent atrial fibrillation: Secondary | ICD-10-CM | POA: Diagnosis not present

## 2022-03-24 DIAGNOSIS — E1140 Type 2 diabetes mellitus with diabetic neuropathy, unspecified: Secondary | ICD-10-CM | POA: Diagnosis not present

## 2022-03-29 DIAGNOSIS — I495 Sick sinus syndrome: Secondary | ICD-10-CM | POA: Diagnosis not present

## 2022-03-29 DIAGNOSIS — R1310 Dysphagia, unspecified: Secondary | ICD-10-CM | POA: Diagnosis not present

## 2022-03-29 DIAGNOSIS — I5043 Acute on chronic combined systolic (congestive) and diastolic (congestive) heart failure: Secondary | ICD-10-CM | POA: Diagnosis not present

## 2022-03-29 DIAGNOSIS — D6869 Other thrombophilia: Secondary | ICD-10-CM | POA: Diagnosis not present

## 2022-03-29 DIAGNOSIS — R69 Illness, unspecified: Secondary | ICD-10-CM | POA: Diagnosis not present

## 2022-03-29 DIAGNOSIS — I2510 Atherosclerotic heart disease of native coronary artery without angina pectoris: Secondary | ICD-10-CM | POA: Diagnosis not present

## 2022-03-29 DIAGNOSIS — N401 Enlarged prostate with lower urinary tract symptoms: Secondary | ICD-10-CM | POA: Diagnosis not present

## 2022-03-29 DIAGNOSIS — I484 Atypical atrial flutter: Secondary | ICD-10-CM | POA: Diagnosis not present

## 2022-03-29 DIAGNOSIS — Z792 Long term (current) use of antibiotics: Secondary | ICD-10-CM | POA: Diagnosis not present

## 2022-03-29 DIAGNOSIS — R634 Abnormal weight loss: Secondary | ICD-10-CM | POA: Diagnosis not present

## 2022-03-29 DIAGNOSIS — M5116 Intervertebral disc disorders with radiculopathy, lumbar region: Secondary | ICD-10-CM | POA: Diagnosis not present

## 2022-03-29 DIAGNOSIS — I080 Rheumatic disorders of both mitral and aortic valves: Secondary | ICD-10-CM | POA: Diagnosis not present

## 2022-03-29 DIAGNOSIS — R350 Frequency of micturition: Secondary | ICD-10-CM | POA: Diagnosis not present

## 2022-03-29 DIAGNOSIS — E7800 Pure hypercholesterolemia, unspecified: Secondary | ICD-10-CM | POA: Diagnosis not present

## 2022-03-29 DIAGNOSIS — K219 Gastro-esophageal reflux disease without esophagitis: Secondary | ICD-10-CM | POA: Diagnosis not present

## 2022-03-29 DIAGNOSIS — E559 Vitamin D deficiency, unspecified: Secondary | ICD-10-CM | POA: Diagnosis not present

## 2022-03-29 DIAGNOSIS — E1140 Type 2 diabetes mellitus with diabetic neuropathy, unspecified: Secondary | ICD-10-CM | POA: Diagnosis not present

## 2022-03-29 DIAGNOSIS — I252 Old myocardial infarction: Secondary | ICD-10-CM | POA: Diagnosis not present

## 2022-03-29 DIAGNOSIS — I77810 Thoracic aortic ectasia: Secondary | ICD-10-CM | POA: Diagnosis not present

## 2022-03-29 DIAGNOSIS — I4819 Other persistent atrial fibrillation: Secondary | ICD-10-CM | POA: Diagnosis not present

## 2022-03-29 DIAGNOSIS — N39498 Other specified urinary incontinence: Secondary | ICD-10-CM | POA: Diagnosis not present

## 2022-03-29 DIAGNOSIS — G4733 Obstructive sleep apnea (adult) (pediatric): Secondary | ICD-10-CM | POA: Diagnosis not present

## 2022-03-29 DIAGNOSIS — I110 Hypertensive heart disease with heart failure: Secondary | ICD-10-CM | POA: Diagnosis not present

## 2022-03-31 DIAGNOSIS — I110 Hypertensive heart disease with heart failure: Secondary | ICD-10-CM | POA: Diagnosis not present

## 2022-03-31 DIAGNOSIS — M5116 Intervertebral disc disorders with radiculopathy, lumbar region: Secondary | ICD-10-CM | POA: Diagnosis not present

## 2022-03-31 DIAGNOSIS — R69 Illness, unspecified: Secondary | ICD-10-CM | POA: Diagnosis not present

## 2022-03-31 DIAGNOSIS — I4819 Other persistent atrial fibrillation: Secondary | ICD-10-CM | POA: Diagnosis not present

## 2022-03-31 DIAGNOSIS — D6869 Other thrombophilia: Secondary | ICD-10-CM | POA: Diagnosis not present

## 2022-03-31 DIAGNOSIS — I484 Atypical atrial flutter: Secondary | ICD-10-CM | POA: Diagnosis not present

## 2022-03-31 DIAGNOSIS — N39498 Other specified urinary incontinence: Secondary | ICD-10-CM | POA: Diagnosis not present

## 2022-03-31 DIAGNOSIS — I080 Rheumatic disorders of both mitral and aortic valves: Secondary | ICD-10-CM | POA: Diagnosis not present

## 2022-03-31 DIAGNOSIS — E1140 Type 2 diabetes mellitus with diabetic neuropathy, unspecified: Secondary | ICD-10-CM | POA: Diagnosis not present

## 2022-03-31 DIAGNOSIS — K219 Gastro-esophageal reflux disease without esophagitis: Secondary | ICD-10-CM | POA: Diagnosis not present

## 2022-03-31 DIAGNOSIS — N401 Enlarged prostate with lower urinary tract symptoms: Secondary | ICD-10-CM | POA: Diagnosis not present

## 2022-03-31 DIAGNOSIS — E559 Vitamin D deficiency, unspecified: Secondary | ICD-10-CM | POA: Diagnosis not present

## 2022-03-31 DIAGNOSIS — I252 Old myocardial infarction: Secondary | ICD-10-CM | POA: Diagnosis not present

## 2022-03-31 DIAGNOSIS — I5043 Acute on chronic combined systolic (congestive) and diastolic (congestive) heart failure: Secondary | ICD-10-CM | POA: Diagnosis not present

## 2022-03-31 DIAGNOSIS — Z792 Long term (current) use of antibiotics: Secondary | ICD-10-CM | POA: Diagnosis not present

## 2022-03-31 DIAGNOSIS — E7800 Pure hypercholesterolemia, unspecified: Secondary | ICD-10-CM | POA: Diagnosis not present

## 2022-03-31 DIAGNOSIS — R634 Abnormal weight loss: Secondary | ICD-10-CM | POA: Diagnosis not present

## 2022-03-31 DIAGNOSIS — R350 Frequency of micturition: Secondary | ICD-10-CM | POA: Diagnosis not present

## 2022-03-31 DIAGNOSIS — I2510 Atherosclerotic heart disease of native coronary artery without angina pectoris: Secondary | ICD-10-CM | POA: Diagnosis not present

## 2022-03-31 DIAGNOSIS — I77810 Thoracic aortic ectasia: Secondary | ICD-10-CM | POA: Diagnosis not present

## 2022-03-31 DIAGNOSIS — R1310 Dysphagia, unspecified: Secondary | ICD-10-CM | POA: Diagnosis not present

## 2022-03-31 DIAGNOSIS — G4733 Obstructive sleep apnea (adult) (pediatric): Secondary | ICD-10-CM | POA: Diagnosis not present

## 2022-03-31 DIAGNOSIS — I495 Sick sinus syndrome: Secondary | ICD-10-CM | POA: Diagnosis not present

## 2022-04-02 DIAGNOSIS — M6281 Muscle weakness (generalized): Secondary | ICD-10-CM | POA: Diagnosis not present

## 2022-04-02 DIAGNOSIS — I5033 Acute on chronic diastolic (congestive) heart failure: Secondary | ICD-10-CM | POA: Diagnosis not present

## 2022-04-02 DIAGNOSIS — I69354 Hemiplegia and hemiparesis following cerebral infarction affecting left non-dominant side: Secondary | ICD-10-CM | POA: Diagnosis not present

## 2022-04-03 ENCOUNTER — Other Ambulatory Visit: Payer: Self-pay | Admitting: Nurse Practitioner

## 2022-04-03 ENCOUNTER — Other Ambulatory Visit: Payer: Self-pay | Admitting: Cardiovascular Disease

## 2022-04-03 DIAGNOSIS — I5030 Unspecified diastolic (congestive) heart failure: Secondary | ICD-10-CM

## 2022-04-03 DIAGNOSIS — F0390 Unspecified dementia without behavioral disturbance: Secondary | ICD-10-CM

## 2022-04-06 ENCOUNTER — Telehealth: Payer: Self-pay

## 2022-04-06 DIAGNOSIS — R350 Frequency of micturition: Secondary | ICD-10-CM | POA: Diagnosis not present

## 2022-04-06 DIAGNOSIS — K219 Gastro-esophageal reflux disease without esophagitis: Secondary | ICD-10-CM | POA: Diagnosis not present

## 2022-04-06 DIAGNOSIS — E1140 Type 2 diabetes mellitus with diabetic neuropathy, unspecified: Secondary | ICD-10-CM | POA: Diagnosis not present

## 2022-04-06 DIAGNOSIS — D6869 Other thrombophilia: Secondary | ICD-10-CM | POA: Diagnosis not present

## 2022-04-06 DIAGNOSIS — E7800 Pure hypercholesterolemia, unspecified: Secondary | ICD-10-CM | POA: Diagnosis not present

## 2022-04-06 DIAGNOSIS — I5043 Acute on chronic combined systolic (congestive) and diastolic (congestive) heart failure: Secondary | ICD-10-CM | POA: Diagnosis not present

## 2022-04-06 DIAGNOSIS — Z792 Long term (current) use of antibiotics: Secondary | ICD-10-CM | POA: Diagnosis not present

## 2022-04-06 DIAGNOSIS — N39498 Other specified urinary incontinence: Secondary | ICD-10-CM | POA: Diagnosis not present

## 2022-04-06 DIAGNOSIS — N401 Enlarged prostate with lower urinary tract symptoms: Secondary | ICD-10-CM | POA: Diagnosis not present

## 2022-04-06 DIAGNOSIS — I2510 Atherosclerotic heart disease of native coronary artery without angina pectoris: Secondary | ICD-10-CM | POA: Diagnosis not present

## 2022-04-06 DIAGNOSIS — G4733 Obstructive sleep apnea (adult) (pediatric): Secondary | ICD-10-CM | POA: Diagnosis not present

## 2022-04-06 DIAGNOSIS — R634 Abnormal weight loss: Secondary | ICD-10-CM | POA: Diagnosis not present

## 2022-04-06 DIAGNOSIS — I495 Sick sinus syndrome: Secondary | ICD-10-CM | POA: Diagnosis not present

## 2022-04-06 DIAGNOSIS — M5116 Intervertebral disc disorders with radiculopathy, lumbar region: Secondary | ICD-10-CM | POA: Diagnosis not present

## 2022-04-06 DIAGNOSIS — I484 Atypical atrial flutter: Secondary | ICD-10-CM | POA: Diagnosis not present

## 2022-04-06 DIAGNOSIS — I080 Rheumatic disorders of both mitral and aortic valves: Secondary | ICD-10-CM | POA: Diagnosis not present

## 2022-04-06 DIAGNOSIS — I110 Hypertensive heart disease with heart failure: Secondary | ICD-10-CM | POA: Diagnosis not present

## 2022-04-06 DIAGNOSIS — I252 Old myocardial infarction: Secondary | ICD-10-CM | POA: Diagnosis not present

## 2022-04-06 DIAGNOSIS — I77810 Thoracic aortic ectasia: Secondary | ICD-10-CM | POA: Diagnosis not present

## 2022-04-06 DIAGNOSIS — R1310 Dysphagia, unspecified: Secondary | ICD-10-CM | POA: Diagnosis not present

## 2022-04-06 DIAGNOSIS — R69 Illness, unspecified: Secondary | ICD-10-CM | POA: Diagnosis not present

## 2022-04-06 DIAGNOSIS — E559 Vitamin D deficiency, unspecified: Secondary | ICD-10-CM | POA: Diagnosis not present

## 2022-04-06 DIAGNOSIS — I4819 Other persistent atrial fibrillation: Secondary | ICD-10-CM | POA: Diagnosis not present

## 2022-04-06 NOTE — Telephone Encounter (Signed)
Anderson Malta, physical therapist with [facility] called requesting verbal orders to extend PT  for 2xs a week for  weeks and 1x a week for 4 weeks.  Verbal orders were given to [facility].

## 2022-04-07 DIAGNOSIS — K219 Gastro-esophageal reflux disease without esophagitis: Secondary | ICD-10-CM | POA: Diagnosis not present

## 2022-04-07 DIAGNOSIS — I2510 Atherosclerotic heart disease of native coronary artery without angina pectoris: Secondary | ICD-10-CM | POA: Diagnosis not present

## 2022-04-07 DIAGNOSIS — M5116 Intervertebral disc disorders with radiculopathy, lumbar region: Secondary | ICD-10-CM | POA: Diagnosis not present

## 2022-04-07 DIAGNOSIS — I080 Rheumatic disorders of both mitral and aortic valves: Secondary | ICD-10-CM | POA: Diagnosis not present

## 2022-04-07 DIAGNOSIS — E7800 Pure hypercholesterolemia, unspecified: Secondary | ICD-10-CM | POA: Diagnosis not present

## 2022-04-07 DIAGNOSIS — R634 Abnormal weight loss: Secondary | ICD-10-CM | POA: Diagnosis not present

## 2022-04-07 DIAGNOSIS — I110 Hypertensive heart disease with heart failure: Secondary | ICD-10-CM | POA: Diagnosis not present

## 2022-04-07 DIAGNOSIS — E1140 Type 2 diabetes mellitus with diabetic neuropathy, unspecified: Secondary | ICD-10-CM | POA: Diagnosis not present

## 2022-04-07 DIAGNOSIS — R69 Illness, unspecified: Secondary | ICD-10-CM | POA: Diagnosis not present

## 2022-04-07 DIAGNOSIS — N39498 Other specified urinary incontinence: Secondary | ICD-10-CM | POA: Diagnosis not present

## 2022-04-07 DIAGNOSIS — I252 Old myocardial infarction: Secondary | ICD-10-CM | POA: Diagnosis not present

## 2022-04-07 DIAGNOSIS — I4819 Other persistent atrial fibrillation: Secondary | ICD-10-CM | POA: Diagnosis not present

## 2022-04-07 DIAGNOSIS — Z792 Long term (current) use of antibiotics: Secondary | ICD-10-CM | POA: Diagnosis not present

## 2022-04-07 DIAGNOSIS — R1310 Dysphagia, unspecified: Secondary | ICD-10-CM | POA: Diagnosis not present

## 2022-04-07 DIAGNOSIS — I5043 Acute on chronic combined systolic (congestive) and diastolic (congestive) heart failure: Secondary | ICD-10-CM | POA: Diagnosis not present

## 2022-04-07 DIAGNOSIS — I495 Sick sinus syndrome: Secondary | ICD-10-CM | POA: Diagnosis not present

## 2022-04-07 DIAGNOSIS — G4733 Obstructive sleep apnea (adult) (pediatric): Secondary | ICD-10-CM | POA: Diagnosis not present

## 2022-04-07 DIAGNOSIS — D6869 Other thrombophilia: Secondary | ICD-10-CM | POA: Diagnosis not present

## 2022-04-07 DIAGNOSIS — R350 Frequency of micturition: Secondary | ICD-10-CM | POA: Diagnosis not present

## 2022-04-07 DIAGNOSIS — E559 Vitamin D deficiency, unspecified: Secondary | ICD-10-CM | POA: Diagnosis not present

## 2022-04-07 DIAGNOSIS — I484 Atypical atrial flutter: Secondary | ICD-10-CM | POA: Diagnosis not present

## 2022-04-07 DIAGNOSIS — N401 Enlarged prostate with lower urinary tract symptoms: Secondary | ICD-10-CM | POA: Diagnosis not present

## 2022-04-07 DIAGNOSIS — I77810 Thoracic aortic ectasia: Secondary | ICD-10-CM | POA: Diagnosis not present

## 2022-04-12 DIAGNOSIS — E559 Vitamin D deficiency, unspecified: Secondary | ICD-10-CM | POA: Diagnosis not present

## 2022-04-12 DIAGNOSIS — E7800 Pure hypercholesterolemia, unspecified: Secondary | ICD-10-CM | POA: Diagnosis not present

## 2022-04-12 DIAGNOSIS — I4819 Other persistent atrial fibrillation: Secondary | ICD-10-CM | POA: Diagnosis not present

## 2022-04-12 DIAGNOSIS — I252 Old myocardial infarction: Secondary | ICD-10-CM | POA: Diagnosis not present

## 2022-04-12 DIAGNOSIS — R69 Illness, unspecified: Secondary | ICD-10-CM | POA: Diagnosis not present

## 2022-04-12 DIAGNOSIS — I495 Sick sinus syndrome: Secondary | ICD-10-CM | POA: Diagnosis not present

## 2022-04-12 DIAGNOSIS — R634 Abnormal weight loss: Secondary | ICD-10-CM | POA: Diagnosis not present

## 2022-04-12 DIAGNOSIS — I080 Rheumatic disorders of both mitral and aortic valves: Secondary | ICD-10-CM | POA: Diagnosis not present

## 2022-04-12 DIAGNOSIS — N39498 Other specified urinary incontinence: Secondary | ICD-10-CM | POA: Diagnosis not present

## 2022-04-12 DIAGNOSIS — D6869 Other thrombophilia: Secondary | ICD-10-CM | POA: Diagnosis not present

## 2022-04-12 DIAGNOSIS — N401 Enlarged prostate with lower urinary tract symptoms: Secondary | ICD-10-CM | POA: Diagnosis not present

## 2022-04-12 DIAGNOSIS — I2510 Atherosclerotic heart disease of native coronary artery without angina pectoris: Secondary | ICD-10-CM | POA: Diagnosis not present

## 2022-04-12 DIAGNOSIS — I77810 Thoracic aortic ectasia: Secondary | ICD-10-CM | POA: Diagnosis not present

## 2022-04-12 DIAGNOSIS — M5116 Intervertebral disc disorders with radiculopathy, lumbar region: Secondary | ICD-10-CM | POA: Diagnosis not present

## 2022-04-12 DIAGNOSIS — I5043 Acute on chronic combined systolic (congestive) and diastolic (congestive) heart failure: Secondary | ICD-10-CM | POA: Diagnosis not present

## 2022-04-12 DIAGNOSIS — I110 Hypertensive heart disease with heart failure: Secondary | ICD-10-CM | POA: Diagnosis not present

## 2022-04-12 DIAGNOSIS — R350 Frequency of micturition: Secondary | ICD-10-CM | POA: Diagnosis not present

## 2022-04-12 DIAGNOSIS — I484 Atypical atrial flutter: Secondary | ICD-10-CM | POA: Diagnosis not present

## 2022-04-12 DIAGNOSIS — G4733 Obstructive sleep apnea (adult) (pediatric): Secondary | ICD-10-CM | POA: Diagnosis not present

## 2022-04-12 DIAGNOSIS — Z792 Long term (current) use of antibiotics: Secondary | ICD-10-CM | POA: Diagnosis not present

## 2022-04-12 DIAGNOSIS — R1310 Dysphagia, unspecified: Secondary | ICD-10-CM | POA: Diagnosis not present

## 2022-04-12 DIAGNOSIS — E1140 Type 2 diabetes mellitus with diabetic neuropathy, unspecified: Secondary | ICD-10-CM | POA: Diagnosis not present

## 2022-04-12 DIAGNOSIS — K219 Gastro-esophageal reflux disease without esophagitis: Secondary | ICD-10-CM | POA: Diagnosis not present

## 2022-04-18 ENCOUNTER — Ambulatory Visit (INDEPENDENT_AMBULATORY_CARE_PROVIDER_SITE_OTHER): Payer: Medicare HMO

## 2022-04-18 DIAGNOSIS — I495 Sick sinus syndrome: Secondary | ICD-10-CM

## 2022-04-18 LAB — CUP PACEART REMOTE DEVICE CHECK
Battery Remaining Longevity: 129 mo
Battery Voltage: 3.01 V
Brady Statistic AP VP Percent: 0.08 %
Brady Statistic AP VS Percent: 99.85 %
Brady Statistic AS VP Percent: 0 %
Brady Statistic AS VS Percent: 0.07 %
Brady Statistic RA Percent Paced: 99.91 %
Brady Statistic RV Percent Paced: 0.08 %
Date Time Interrogation Session: 20230918211741
Implantable Lead Implant Date: 20201207
Implantable Lead Implant Date: 20201207
Implantable Lead Location: 753859
Implantable Lead Location: 753860
Implantable Lead Model: 5076
Implantable Lead Model: 5076
Implantable Pulse Generator Implant Date: 20201207
Lead Channel Impedance Value: 342 Ohm
Lead Channel Impedance Value: 380 Ohm
Lead Channel Impedance Value: 437 Ohm
Lead Channel Impedance Value: 475 Ohm
Lead Channel Pacing Threshold Amplitude: 0.625 V
Lead Channel Pacing Threshold Amplitude: 1.125 V
Lead Channel Pacing Threshold Pulse Width: 0.4 ms
Lead Channel Pacing Threshold Pulse Width: 0.4 ms
Lead Channel Sensing Intrinsic Amplitude: 2.25 mV
Lead Channel Sensing Intrinsic Amplitude: 2.25 mV
Lead Channel Sensing Intrinsic Amplitude: 28.75 mV
Lead Channel Sensing Intrinsic Amplitude: 28.75 mV
Lead Channel Setting Pacing Amplitude: 1.5 V
Lead Channel Setting Pacing Amplitude: 2.5 V
Lead Channel Setting Pacing Pulse Width: 0.4 ms
Lead Channel Setting Sensing Sensitivity: 1.2 mV

## 2022-04-19 DIAGNOSIS — I5043 Acute on chronic combined systolic (congestive) and diastolic (congestive) heart failure: Secondary | ICD-10-CM | POA: Diagnosis not present

## 2022-04-19 DIAGNOSIS — R69 Illness, unspecified: Secondary | ICD-10-CM | POA: Diagnosis not present

## 2022-04-19 DIAGNOSIS — I495 Sick sinus syndrome: Secondary | ICD-10-CM | POA: Diagnosis not present

## 2022-04-19 DIAGNOSIS — N401 Enlarged prostate with lower urinary tract symptoms: Secondary | ICD-10-CM | POA: Diagnosis not present

## 2022-04-19 DIAGNOSIS — K219 Gastro-esophageal reflux disease without esophagitis: Secondary | ICD-10-CM | POA: Diagnosis not present

## 2022-04-19 DIAGNOSIS — I4819 Other persistent atrial fibrillation: Secondary | ICD-10-CM | POA: Diagnosis not present

## 2022-04-19 DIAGNOSIS — Z792 Long term (current) use of antibiotics: Secondary | ICD-10-CM | POA: Diagnosis not present

## 2022-04-19 DIAGNOSIS — I080 Rheumatic disorders of both mitral and aortic valves: Secondary | ICD-10-CM | POA: Diagnosis not present

## 2022-04-19 DIAGNOSIS — D6869 Other thrombophilia: Secondary | ICD-10-CM | POA: Diagnosis not present

## 2022-04-19 DIAGNOSIS — I2510 Atherosclerotic heart disease of native coronary artery without angina pectoris: Secondary | ICD-10-CM | POA: Diagnosis not present

## 2022-04-19 DIAGNOSIS — I252 Old myocardial infarction: Secondary | ICD-10-CM | POA: Diagnosis not present

## 2022-04-19 DIAGNOSIS — R634 Abnormal weight loss: Secondary | ICD-10-CM | POA: Diagnosis not present

## 2022-04-19 DIAGNOSIS — G4733 Obstructive sleep apnea (adult) (pediatric): Secondary | ICD-10-CM | POA: Diagnosis not present

## 2022-04-19 DIAGNOSIS — R350 Frequency of micturition: Secondary | ICD-10-CM | POA: Diagnosis not present

## 2022-04-19 DIAGNOSIS — I77810 Thoracic aortic ectasia: Secondary | ICD-10-CM | POA: Diagnosis not present

## 2022-04-19 DIAGNOSIS — E7800 Pure hypercholesterolemia, unspecified: Secondary | ICD-10-CM | POA: Diagnosis not present

## 2022-04-19 DIAGNOSIS — I110 Hypertensive heart disease with heart failure: Secondary | ICD-10-CM | POA: Diagnosis not present

## 2022-04-19 DIAGNOSIS — I484 Atypical atrial flutter: Secondary | ICD-10-CM | POA: Diagnosis not present

## 2022-04-19 DIAGNOSIS — N39498 Other specified urinary incontinence: Secondary | ICD-10-CM | POA: Diagnosis not present

## 2022-04-19 DIAGNOSIS — E559 Vitamin D deficiency, unspecified: Secondary | ICD-10-CM | POA: Diagnosis not present

## 2022-04-19 DIAGNOSIS — M5116 Intervertebral disc disorders with radiculopathy, lumbar region: Secondary | ICD-10-CM | POA: Diagnosis not present

## 2022-04-19 DIAGNOSIS — E1140 Type 2 diabetes mellitus with diabetic neuropathy, unspecified: Secondary | ICD-10-CM | POA: Diagnosis not present

## 2022-04-19 DIAGNOSIS — R1310 Dysphagia, unspecified: Secondary | ICD-10-CM | POA: Diagnosis not present

## 2022-04-21 DIAGNOSIS — K219 Gastro-esophageal reflux disease without esophagitis: Secondary | ICD-10-CM | POA: Diagnosis not present

## 2022-04-21 DIAGNOSIS — R634 Abnormal weight loss: Secondary | ICD-10-CM | POA: Diagnosis not present

## 2022-04-21 DIAGNOSIS — N401 Enlarged prostate with lower urinary tract symptoms: Secondary | ICD-10-CM | POA: Diagnosis not present

## 2022-04-21 DIAGNOSIS — D6869 Other thrombophilia: Secondary | ICD-10-CM | POA: Diagnosis not present

## 2022-04-21 DIAGNOSIS — E1140 Type 2 diabetes mellitus with diabetic neuropathy, unspecified: Secondary | ICD-10-CM | POA: Diagnosis not present

## 2022-04-21 DIAGNOSIS — E7800 Pure hypercholesterolemia, unspecified: Secondary | ICD-10-CM | POA: Diagnosis not present

## 2022-04-21 DIAGNOSIS — R350 Frequency of micturition: Secondary | ICD-10-CM | POA: Diagnosis not present

## 2022-04-21 DIAGNOSIS — I2510 Atherosclerotic heart disease of native coronary artery without angina pectoris: Secondary | ICD-10-CM | POA: Diagnosis not present

## 2022-04-21 DIAGNOSIS — I77810 Thoracic aortic ectasia: Secondary | ICD-10-CM | POA: Diagnosis not present

## 2022-04-21 DIAGNOSIS — Z792 Long term (current) use of antibiotics: Secondary | ICD-10-CM | POA: Diagnosis not present

## 2022-04-21 DIAGNOSIS — I252 Old myocardial infarction: Secondary | ICD-10-CM | POA: Diagnosis not present

## 2022-04-21 DIAGNOSIS — G4733 Obstructive sleep apnea (adult) (pediatric): Secondary | ICD-10-CM | POA: Diagnosis not present

## 2022-04-21 DIAGNOSIS — E559 Vitamin D deficiency, unspecified: Secondary | ICD-10-CM | POA: Diagnosis not present

## 2022-04-21 DIAGNOSIS — I495 Sick sinus syndrome: Secondary | ICD-10-CM | POA: Diagnosis not present

## 2022-04-21 DIAGNOSIS — I4819 Other persistent atrial fibrillation: Secondary | ICD-10-CM | POA: Diagnosis not present

## 2022-04-21 DIAGNOSIS — R69 Illness, unspecified: Secondary | ICD-10-CM | POA: Diagnosis not present

## 2022-04-21 DIAGNOSIS — R1310 Dysphagia, unspecified: Secondary | ICD-10-CM | POA: Diagnosis not present

## 2022-04-21 DIAGNOSIS — I5043 Acute on chronic combined systolic (congestive) and diastolic (congestive) heart failure: Secondary | ICD-10-CM | POA: Diagnosis not present

## 2022-04-21 DIAGNOSIS — I110 Hypertensive heart disease with heart failure: Secondary | ICD-10-CM | POA: Diagnosis not present

## 2022-04-21 DIAGNOSIS — N39498 Other specified urinary incontinence: Secondary | ICD-10-CM | POA: Diagnosis not present

## 2022-04-21 DIAGNOSIS — I080 Rheumatic disorders of both mitral and aortic valves: Secondary | ICD-10-CM | POA: Diagnosis not present

## 2022-04-21 DIAGNOSIS — M5116 Intervertebral disc disorders with radiculopathy, lumbar region: Secondary | ICD-10-CM | POA: Diagnosis not present

## 2022-04-21 DIAGNOSIS — I484 Atypical atrial flutter: Secondary | ICD-10-CM | POA: Diagnosis not present

## 2022-05-02 DIAGNOSIS — R634 Abnormal weight loss: Secondary | ICD-10-CM | POA: Diagnosis not present

## 2022-05-02 DIAGNOSIS — R1310 Dysphagia, unspecified: Secondary | ICD-10-CM | POA: Diagnosis not present

## 2022-05-02 DIAGNOSIS — D6869 Other thrombophilia: Secondary | ICD-10-CM | POA: Diagnosis not present

## 2022-05-02 DIAGNOSIS — I252 Old myocardial infarction: Secondary | ICD-10-CM | POA: Diagnosis not present

## 2022-05-02 DIAGNOSIS — I5033 Acute on chronic diastolic (congestive) heart failure: Secondary | ICD-10-CM | POA: Diagnosis not present

## 2022-05-02 DIAGNOSIS — G4733 Obstructive sleep apnea (adult) (pediatric): Secondary | ICD-10-CM | POA: Diagnosis not present

## 2022-05-02 DIAGNOSIS — I2510 Atherosclerotic heart disease of native coronary artery without angina pectoris: Secondary | ICD-10-CM | POA: Diagnosis not present

## 2022-05-02 DIAGNOSIS — I080 Rheumatic disorders of both mitral and aortic valves: Secondary | ICD-10-CM | POA: Diagnosis not present

## 2022-05-02 DIAGNOSIS — I495 Sick sinus syndrome: Secondary | ICD-10-CM | POA: Diagnosis not present

## 2022-05-02 DIAGNOSIS — R69 Illness, unspecified: Secondary | ICD-10-CM | POA: Diagnosis not present

## 2022-05-02 DIAGNOSIS — N39498 Other specified urinary incontinence: Secondary | ICD-10-CM | POA: Diagnosis not present

## 2022-05-02 DIAGNOSIS — I484 Atypical atrial flutter: Secondary | ICD-10-CM | POA: Diagnosis not present

## 2022-05-02 DIAGNOSIS — E7800 Pure hypercholesterolemia, unspecified: Secondary | ICD-10-CM | POA: Diagnosis not present

## 2022-05-02 DIAGNOSIS — I4819 Other persistent atrial fibrillation: Secondary | ICD-10-CM | POA: Diagnosis not present

## 2022-05-02 DIAGNOSIS — I5043 Acute on chronic combined systolic (congestive) and diastolic (congestive) heart failure: Secondary | ICD-10-CM | POA: Diagnosis not present

## 2022-05-02 DIAGNOSIS — Z792 Long term (current) use of antibiotics: Secondary | ICD-10-CM | POA: Diagnosis not present

## 2022-05-02 DIAGNOSIS — E559 Vitamin D deficiency, unspecified: Secondary | ICD-10-CM | POA: Diagnosis not present

## 2022-05-02 DIAGNOSIS — N401 Enlarged prostate with lower urinary tract symptoms: Secondary | ICD-10-CM | POA: Diagnosis not present

## 2022-05-02 DIAGNOSIS — E1140 Type 2 diabetes mellitus with diabetic neuropathy, unspecified: Secondary | ICD-10-CM | POA: Diagnosis not present

## 2022-05-02 DIAGNOSIS — I77810 Thoracic aortic ectasia: Secondary | ICD-10-CM | POA: Diagnosis not present

## 2022-05-02 DIAGNOSIS — I69354 Hemiplegia and hemiparesis following cerebral infarction affecting left non-dominant side: Secondary | ICD-10-CM | POA: Diagnosis not present

## 2022-05-02 DIAGNOSIS — K219 Gastro-esophageal reflux disease without esophagitis: Secondary | ICD-10-CM | POA: Diagnosis not present

## 2022-05-02 DIAGNOSIS — M5116 Intervertebral disc disorders with radiculopathy, lumbar region: Secondary | ICD-10-CM | POA: Diagnosis not present

## 2022-05-02 DIAGNOSIS — M6281 Muscle weakness (generalized): Secondary | ICD-10-CM | POA: Diagnosis not present

## 2022-05-02 DIAGNOSIS — I110 Hypertensive heart disease with heart failure: Secondary | ICD-10-CM | POA: Diagnosis not present

## 2022-05-02 DIAGNOSIS — R350 Frequency of micturition: Secondary | ICD-10-CM | POA: Diagnosis not present

## 2022-05-02 NOTE — Progress Notes (Signed)
Remote pacemaker transmission.   

## 2022-05-08 ENCOUNTER — Telehealth: Payer: Self-pay

## 2022-05-08 DIAGNOSIS — I484 Atypical atrial flutter: Secondary | ICD-10-CM | POA: Diagnosis not present

## 2022-05-08 DIAGNOSIS — G4733 Obstructive sleep apnea (adult) (pediatric): Secondary | ICD-10-CM | POA: Diagnosis not present

## 2022-05-08 DIAGNOSIS — I495 Sick sinus syndrome: Secondary | ICD-10-CM | POA: Diagnosis not present

## 2022-05-08 DIAGNOSIS — K219 Gastro-esophageal reflux disease without esophagitis: Secondary | ICD-10-CM | POA: Diagnosis not present

## 2022-05-08 DIAGNOSIS — I252 Old myocardial infarction: Secondary | ICD-10-CM | POA: Diagnosis not present

## 2022-05-08 DIAGNOSIS — N401 Enlarged prostate with lower urinary tract symptoms: Secondary | ICD-10-CM | POA: Diagnosis not present

## 2022-05-08 DIAGNOSIS — I2510 Atherosclerotic heart disease of native coronary artery without angina pectoris: Secondary | ICD-10-CM | POA: Diagnosis not present

## 2022-05-08 DIAGNOSIS — R69 Illness, unspecified: Secondary | ICD-10-CM | POA: Diagnosis not present

## 2022-05-08 DIAGNOSIS — I110 Hypertensive heart disease with heart failure: Secondary | ICD-10-CM | POA: Diagnosis not present

## 2022-05-08 DIAGNOSIS — D6869 Other thrombophilia: Secondary | ICD-10-CM | POA: Diagnosis not present

## 2022-05-08 DIAGNOSIS — R350 Frequency of micturition: Secondary | ICD-10-CM | POA: Diagnosis not present

## 2022-05-08 DIAGNOSIS — R1310 Dysphagia, unspecified: Secondary | ICD-10-CM | POA: Diagnosis not present

## 2022-05-08 DIAGNOSIS — E7800 Pure hypercholesterolemia, unspecified: Secondary | ICD-10-CM | POA: Diagnosis not present

## 2022-05-08 DIAGNOSIS — I5043 Acute on chronic combined systolic (congestive) and diastolic (congestive) heart failure: Secondary | ICD-10-CM | POA: Diagnosis not present

## 2022-05-08 DIAGNOSIS — Z792 Long term (current) use of antibiotics: Secondary | ICD-10-CM | POA: Diagnosis not present

## 2022-05-08 DIAGNOSIS — I4819 Other persistent atrial fibrillation: Secondary | ICD-10-CM | POA: Diagnosis not present

## 2022-05-08 DIAGNOSIS — I080 Rheumatic disorders of both mitral and aortic valves: Secondary | ICD-10-CM | POA: Diagnosis not present

## 2022-05-08 DIAGNOSIS — I77810 Thoracic aortic ectasia: Secondary | ICD-10-CM | POA: Diagnosis not present

## 2022-05-08 DIAGNOSIS — N39498 Other specified urinary incontinence: Secondary | ICD-10-CM | POA: Diagnosis not present

## 2022-05-08 DIAGNOSIS — E1140 Type 2 diabetes mellitus with diabetic neuropathy, unspecified: Secondary | ICD-10-CM | POA: Diagnosis not present

## 2022-05-08 DIAGNOSIS — M5116 Intervertebral disc disorders with radiculopathy, lumbar region: Secondary | ICD-10-CM | POA: Diagnosis not present

## 2022-05-08 DIAGNOSIS — E559 Vitamin D deficiency, unspecified: Secondary | ICD-10-CM | POA: Diagnosis not present

## 2022-05-08 DIAGNOSIS — R634 Abnormal weight loss: Secondary | ICD-10-CM | POA: Diagnosis not present

## 2022-05-08 NOTE — Telephone Encounter (Signed)
Noted to continue to monitor and notify if having symptoms or staying elevated

## 2022-05-08 NOTE — Telephone Encounter (Signed)
Jose Patel PT with [facility] called and left vm on clinical intake phone. She stated that patient's BP today was 747/18 systolic is outside of parameter for patient. Patient had just taken his bp medication 30 minutes before she came. It is just protocol for them to call the PCP and inform them.  Message routed to Sherrie Mustache, NP

## 2022-05-12 DIAGNOSIS — I252 Old myocardial infarction: Secondary | ICD-10-CM | POA: Diagnosis not present

## 2022-05-12 DIAGNOSIS — N401 Enlarged prostate with lower urinary tract symptoms: Secondary | ICD-10-CM | POA: Diagnosis not present

## 2022-05-12 DIAGNOSIS — R634 Abnormal weight loss: Secondary | ICD-10-CM | POA: Diagnosis not present

## 2022-05-12 DIAGNOSIS — D6869 Other thrombophilia: Secondary | ICD-10-CM | POA: Diagnosis not present

## 2022-05-12 DIAGNOSIS — G4733 Obstructive sleep apnea (adult) (pediatric): Secondary | ICD-10-CM | POA: Diagnosis not present

## 2022-05-12 DIAGNOSIS — I5043 Acute on chronic combined systolic (congestive) and diastolic (congestive) heart failure: Secondary | ICD-10-CM | POA: Diagnosis not present

## 2022-05-12 DIAGNOSIS — R69 Illness, unspecified: Secondary | ICD-10-CM | POA: Diagnosis not present

## 2022-05-12 DIAGNOSIS — E7800 Pure hypercholesterolemia, unspecified: Secondary | ICD-10-CM | POA: Diagnosis not present

## 2022-05-12 DIAGNOSIS — E559 Vitamin D deficiency, unspecified: Secondary | ICD-10-CM | POA: Diagnosis not present

## 2022-05-12 DIAGNOSIS — I110 Hypertensive heart disease with heart failure: Secondary | ICD-10-CM | POA: Diagnosis not present

## 2022-05-12 DIAGNOSIS — I77810 Thoracic aortic ectasia: Secondary | ICD-10-CM | POA: Diagnosis not present

## 2022-05-12 DIAGNOSIS — I495 Sick sinus syndrome: Secondary | ICD-10-CM | POA: Diagnosis not present

## 2022-05-12 DIAGNOSIS — I080 Rheumatic disorders of both mitral and aortic valves: Secondary | ICD-10-CM | POA: Diagnosis not present

## 2022-05-12 DIAGNOSIS — E1140 Type 2 diabetes mellitus with diabetic neuropathy, unspecified: Secondary | ICD-10-CM | POA: Diagnosis not present

## 2022-05-12 DIAGNOSIS — I484 Atypical atrial flutter: Secondary | ICD-10-CM | POA: Diagnosis not present

## 2022-05-12 DIAGNOSIS — M5116 Intervertebral disc disorders with radiculopathy, lumbar region: Secondary | ICD-10-CM | POA: Diagnosis not present

## 2022-05-12 DIAGNOSIS — I2510 Atherosclerotic heart disease of native coronary artery without angina pectoris: Secondary | ICD-10-CM | POA: Diagnosis not present

## 2022-05-12 DIAGNOSIS — N39498 Other specified urinary incontinence: Secondary | ICD-10-CM | POA: Diagnosis not present

## 2022-05-12 DIAGNOSIS — I4819 Other persistent atrial fibrillation: Secondary | ICD-10-CM | POA: Diagnosis not present

## 2022-05-12 DIAGNOSIS — Z792 Long term (current) use of antibiotics: Secondary | ICD-10-CM | POA: Diagnosis not present

## 2022-05-12 DIAGNOSIS — R350 Frequency of micturition: Secondary | ICD-10-CM | POA: Diagnosis not present

## 2022-05-12 DIAGNOSIS — R1310 Dysphagia, unspecified: Secondary | ICD-10-CM | POA: Diagnosis not present

## 2022-05-12 DIAGNOSIS — K219 Gastro-esophageal reflux disease without esophagitis: Secondary | ICD-10-CM | POA: Diagnosis not present

## 2022-05-19 DIAGNOSIS — K219 Gastro-esophageal reflux disease without esophagitis: Secondary | ICD-10-CM | POA: Diagnosis not present

## 2022-05-19 DIAGNOSIS — I252 Old myocardial infarction: Secondary | ICD-10-CM | POA: Diagnosis not present

## 2022-05-19 DIAGNOSIS — I5043 Acute on chronic combined systolic (congestive) and diastolic (congestive) heart failure: Secondary | ICD-10-CM | POA: Diagnosis not present

## 2022-05-19 DIAGNOSIS — I495 Sick sinus syndrome: Secondary | ICD-10-CM | POA: Diagnosis not present

## 2022-05-19 DIAGNOSIS — E559 Vitamin D deficiency, unspecified: Secondary | ICD-10-CM | POA: Diagnosis not present

## 2022-05-19 DIAGNOSIS — I484 Atypical atrial flutter: Secondary | ICD-10-CM | POA: Diagnosis not present

## 2022-05-19 DIAGNOSIS — N39498 Other specified urinary incontinence: Secondary | ICD-10-CM | POA: Diagnosis not present

## 2022-05-19 DIAGNOSIS — E1140 Type 2 diabetes mellitus with diabetic neuropathy, unspecified: Secondary | ICD-10-CM | POA: Diagnosis not present

## 2022-05-19 DIAGNOSIS — D6869 Other thrombophilia: Secondary | ICD-10-CM | POA: Diagnosis not present

## 2022-05-19 DIAGNOSIS — I110 Hypertensive heart disease with heart failure: Secondary | ICD-10-CM | POA: Diagnosis not present

## 2022-05-19 DIAGNOSIS — R69 Illness, unspecified: Secondary | ICD-10-CM | POA: Diagnosis not present

## 2022-05-19 DIAGNOSIS — I77810 Thoracic aortic ectasia: Secondary | ICD-10-CM | POA: Diagnosis not present

## 2022-05-19 DIAGNOSIS — R350 Frequency of micturition: Secondary | ICD-10-CM | POA: Diagnosis not present

## 2022-05-19 DIAGNOSIS — N401 Enlarged prostate with lower urinary tract symptoms: Secondary | ICD-10-CM | POA: Diagnosis not present

## 2022-05-19 DIAGNOSIS — E7800 Pure hypercholesterolemia, unspecified: Secondary | ICD-10-CM | POA: Diagnosis not present

## 2022-05-19 DIAGNOSIS — R634 Abnormal weight loss: Secondary | ICD-10-CM | POA: Diagnosis not present

## 2022-05-19 DIAGNOSIS — M5116 Intervertebral disc disorders with radiculopathy, lumbar region: Secondary | ICD-10-CM | POA: Diagnosis not present

## 2022-05-19 DIAGNOSIS — G4733 Obstructive sleep apnea (adult) (pediatric): Secondary | ICD-10-CM | POA: Diagnosis not present

## 2022-05-19 DIAGNOSIS — Z792 Long term (current) use of antibiotics: Secondary | ICD-10-CM | POA: Diagnosis not present

## 2022-05-19 DIAGNOSIS — I2510 Atherosclerotic heart disease of native coronary artery without angina pectoris: Secondary | ICD-10-CM | POA: Diagnosis not present

## 2022-05-19 DIAGNOSIS — I080 Rheumatic disorders of both mitral and aortic valves: Secondary | ICD-10-CM | POA: Diagnosis not present

## 2022-05-19 DIAGNOSIS — I4819 Other persistent atrial fibrillation: Secondary | ICD-10-CM | POA: Diagnosis not present

## 2022-05-19 DIAGNOSIS — R1310 Dysphagia, unspecified: Secondary | ICD-10-CM | POA: Diagnosis not present

## 2022-05-25 DIAGNOSIS — G4733 Obstructive sleep apnea (adult) (pediatric): Secondary | ICD-10-CM | POA: Diagnosis not present

## 2022-05-25 DIAGNOSIS — R69 Illness, unspecified: Secondary | ICD-10-CM | POA: Diagnosis not present

## 2022-05-25 DIAGNOSIS — I110 Hypertensive heart disease with heart failure: Secondary | ICD-10-CM | POA: Diagnosis not present

## 2022-05-25 DIAGNOSIS — E559 Vitamin D deficiency, unspecified: Secondary | ICD-10-CM | POA: Diagnosis not present

## 2022-05-25 DIAGNOSIS — I5043 Acute on chronic combined systolic (congestive) and diastolic (congestive) heart failure: Secondary | ICD-10-CM | POA: Diagnosis not present

## 2022-05-25 DIAGNOSIS — I080 Rheumatic disorders of both mitral and aortic valves: Secondary | ICD-10-CM | POA: Diagnosis not present

## 2022-05-25 DIAGNOSIS — N39498 Other specified urinary incontinence: Secondary | ICD-10-CM | POA: Diagnosis not present

## 2022-05-25 DIAGNOSIS — M5116 Intervertebral disc disorders with radiculopathy, lumbar region: Secondary | ICD-10-CM | POA: Diagnosis not present

## 2022-05-25 DIAGNOSIS — I252 Old myocardial infarction: Secondary | ICD-10-CM | POA: Diagnosis not present

## 2022-05-25 DIAGNOSIS — N401 Enlarged prostate with lower urinary tract symptoms: Secondary | ICD-10-CM | POA: Diagnosis not present

## 2022-05-25 DIAGNOSIS — I77810 Thoracic aortic ectasia: Secondary | ICD-10-CM | POA: Diagnosis not present

## 2022-05-25 DIAGNOSIS — I2510 Atherosclerotic heart disease of native coronary artery without angina pectoris: Secondary | ICD-10-CM | POA: Diagnosis not present

## 2022-05-25 DIAGNOSIS — R350 Frequency of micturition: Secondary | ICD-10-CM | POA: Diagnosis not present

## 2022-05-25 DIAGNOSIS — E1140 Type 2 diabetes mellitus with diabetic neuropathy, unspecified: Secondary | ICD-10-CM | POA: Diagnosis not present

## 2022-05-25 DIAGNOSIS — K219 Gastro-esophageal reflux disease without esophagitis: Secondary | ICD-10-CM | POA: Diagnosis not present

## 2022-05-25 DIAGNOSIS — R634 Abnormal weight loss: Secondary | ICD-10-CM | POA: Diagnosis not present

## 2022-05-25 DIAGNOSIS — I4819 Other persistent atrial fibrillation: Secondary | ICD-10-CM | POA: Diagnosis not present

## 2022-05-25 DIAGNOSIS — D6869 Other thrombophilia: Secondary | ICD-10-CM | POA: Diagnosis not present

## 2022-05-25 DIAGNOSIS — R1310 Dysphagia, unspecified: Secondary | ICD-10-CM | POA: Diagnosis not present

## 2022-05-25 DIAGNOSIS — E7800 Pure hypercholesterolemia, unspecified: Secondary | ICD-10-CM | POA: Diagnosis not present

## 2022-05-25 DIAGNOSIS — I484 Atypical atrial flutter: Secondary | ICD-10-CM | POA: Diagnosis not present

## 2022-05-25 DIAGNOSIS — Z792 Long term (current) use of antibiotics: Secondary | ICD-10-CM | POA: Diagnosis not present

## 2022-05-25 DIAGNOSIS — I495 Sick sinus syndrome: Secondary | ICD-10-CM | POA: Diagnosis not present

## 2022-06-02 ENCOUNTER — Ambulatory Visit (INDEPENDENT_AMBULATORY_CARE_PROVIDER_SITE_OTHER): Payer: Medicare HMO | Admitting: Nurse Practitioner

## 2022-06-02 ENCOUNTER — Encounter: Payer: Self-pay | Admitting: Nurse Practitioner

## 2022-06-02 VITALS — BP 122/64 | HR 92 | Temp 97.6°F | Resp 17 | Ht 69.0 in | Wt 162.4 lb

## 2022-06-02 DIAGNOSIS — I480 Paroxysmal atrial fibrillation: Secondary | ICD-10-CM

## 2022-06-02 DIAGNOSIS — N401 Enlarged prostate with lower urinary tract symptoms: Secondary | ICD-10-CM | POA: Diagnosis not present

## 2022-06-02 DIAGNOSIS — D6869 Other thrombophilia: Secondary | ICD-10-CM

## 2022-06-02 DIAGNOSIS — R69 Illness, unspecified: Secondary | ICD-10-CM | POA: Diagnosis not present

## 2022-06-02 DIAGNOSIS — F320 Major depressive disorder, single episode, mild: Secondary | ICD-10-CM

## 2022-06-02 DIAGNOSIS — I10 Essential (primary) hypertension: Secondary | ICD-10-CM

## 2022-06-02 DIAGNOSIS — R350 Frequency of micturition: Secondary | ICD-10-CM | POA: Diagnosis not present

## 2022-06-02 DIAGNOSIS — Z23 Encounter for immunization: Secondary | ICD-10-CM | POA: Diagnosis not present

## 2022-06-02 DIAGNOSIS — I5030 Unspecified diastolic (congestive) heart failure: Secondary | ICD-10-CM | POA: Diagnosis not present

## 2022-06-02 DIAGNOSIS — E1140 Type 2 diabetes mellitus with diabetic neuropathy, unspecified: Secondary | ICD-10-CM

## 2022-06-02 NOTE — Progress Notes (Signed)
Careteam: Patient Care Team: [name], NP as PCP - General (Geriatric Medicine) [name], MD as PCP - Cardiology (Cardiology) Center, [facility] (Gastroenterology) [name], MD as Consulting Physician  PLACE OF SERVICE:  [place] Directive information Does Patient Have a Medical Advance Directive?: Yes, Type of Advance Directive: [place];Living will, Does patient want to make changes to medical advance directive?: No - Patient declined  Allergies  Allergen Reactions   Aricept [Donepezil] Diarrhea, Nausea And Vomiting and Other (See Comments)    Cannot tolerate- bradycardia, also   Tropicamide Nausea And Vomiting    Used to dilate eyes (might be this- was switched to something this was tolerable, after this)   Adhesive [Tape] Rash    Chief Complaint  Patient presents with   Medical Management of Chronic Issues    4 month follow up.   Immunizations    Discussed the need for Shingles, covid, and flu vaccine   Quality Metric Gaps    Discussed the important need for foot exam and eye exam     HPI: Patient is a 88 y.o. male for routine follow up.   Continues to follow with cardiology- and pacer checks. - no events.   He is doing PT and doing well with this.   Bph- using depends due to lack of control. On flomax.  Daughter has tried to do scheduled bathroom trips.   No longer requiring CPAP  Daughter has someone to come help him but he is not consistent, she is working more.  He has missed taking medication 2 days out of 5.  His daughter tries to remind him to take medication.  He does not like taking diuretic.    Review of Systems:  Review of Systems  Constitutional:  Negative for chills, fever and weight loss.  HENT:  Negative for tinnitus.   Respiratory:  Negative for cough, sputum production and shortness of breath.   Cardiovascular:  Negative for chest pain, palpitations and leg swelling.   Gastrointestinal:  Negative for abdominal pain, constipation, diarrhea and heartburn.  Genitourinary:  Positive for frequency. Negative for dysuria and urgency.  Musculoskeletal:  Negative for back pain, falls, joint pain and myalgias.  Skin: Negative.   Neurological:  Negative for dizziness and headaches.  Psychiatric/Behavioral:  Positive for memory loss. Negative for depression. The patient does not have insomnia.     Past Medical History:  Diagnosis Date   A-fib ([place])    Anticoagulated on Coumadin    Per records from [facility]    Aortic regurgitation    Aortic stenosis    CHF (congestive heart failure) ([place])    Coronary artery disease    COVID    CVA (cerebral vascular accident) ([place])    hx of CVA noted on CT from 02/19/18   Diabetes mellitus, type 2 ([place])    Gastro-esophageal reflux disease without esophagitis    Per records from previous provider, [facility]   Heart attack [facility])    History of CT scan of head 02/19/2018   Per records from previous provider, [facility]. Chronic changes small vessel disease   History of ECG    03/26/14- Sinus Tachycardia @ 107 bmp, QRS 78 msec, QT 298 msec, QTc 361 msec. 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 [facility])    Per records from previous provider, [facility]    MI (myocardial infarction) [facility])    Per records from [place]    Mitral regurgitation    Mitral stenosis    Other intervertebral disc degeneration, lumbar region    Per records from previous provider, [facility]   Radiculopathy, lumbar region    Per records from previous provider, [facility]   Sinus bradycardia    Per records from  [place]    Sleep apnea    Transient ischemic attack    10 to 12 years ago   Typical atrial flutter [facility])    Per records from [place]    Past Surgical History:  Procedure Laterality Date   [place] N/A 12/26/2018   Procedure: CARDIOVERSION;  Surgeon: Pixie Casino, MD;  Location: [facility];  Service: Cardiovascular;  Laterality: N/A;   CARDIOVERSION N/A 05/14/2019   Procedure: CARDIOVERSION;  Surgeon: [name], MD;  Location: [facility] ENDOSCOPY;  Service: Cardiovascular;  Laterality: N/A;   CATARACT EXTRACTION     Debridement to right arm with MRSA infection     4 years ago   GALLBLADDER SURGERY     Per records from [place] 07/07/2019   Procedure: PACEMAKER IMPLANT;  Surgeon: [name], MD;  Location: [place] CV LAB;  Service: Cardiovascular;  Laterality: N/A;   TEE WITHOUT CARDIOVERSION N/A 12/26/2018   Procedure: TRANSESOPHAGEAL ECHOCARDIOGRAM (TEE);  Surgeon: Pixie Casino, MD;  Location: [facility] ENDOSCOPY;  Service: Cardiovascular;  Laterality: N/A;   Social History:   reports that he quit smoking about 68 years ago. His smoking use included cigarettes and cigars. He has never used smokeless tobacco. He reports that he does not drink alcohol and does not use drugs.  Family History  Problem Relation Age of Onset   Heart failure Mother    Hyperlipidemia Mother    Hypertension Son    Hyperlipidemia Son    Arthritis Son    Arthritis Daughter    Hypertension Son     Medications: Patient's Medications  New Prescriptions   No medications on file  Previous Medications   AMIODARONE (PACERONE) 200 MG TABLET    TAKE ONE TABLET BY MOUTH ONE TIME DAILY   CHOLECALCIFEROL (VITAMIN D3) 50 MCG (2000 UT) TABS    Take 2,000 Units by mouth daily with breakfast.   LOSARTAN (COZAAR) 100 MG TABLET    TAKE ONE TABLET BY MOUTH ONE TIME DAILY   MELATONIN 5 MG TABS    Take 5 mg by mouth at  bedtime as needed (for sleep).    MEMANTINE (NAMENDA) 10 MG TABLET    TAKE ONE TABLET BY MOUTH TWICE DAILY   METOPROLOL SUCCINATE (TOPROL-XL) 50 MG 24 HR TABLET    TAKE ONE TABLET BY MOUTH ONE TIME DAILY   POTASSIUM CHLORIDE (KLOR-CON M) 10 MEQ TABLET    TAKE ONE TABLET BY MOUTH ONE TIME DAILY   RIVAROXABAN (XARELTO) 20 MG TABS TABLET    TAKE 1 TABLET BY MOUTH DAILY WITH SUPPER   TAMSULOSIN (FLOMAX) 0.4 MG CAPS CAPSULE    TAKE ONE CAPSULE BY MOUTH ONE TIME DAILY   TORSEMIDE (DEMADEX) 20 MG TABLET    Take 0.5 tablets (10 mg total) by mouth daily.   TRAZODONE (DESYREL) 50 MG  TABLET    Take 0.5-1 tablets (25-50 mg total) by mouth at bedtime as needed for sleep.  Modified Medications   No medications on file  Discontinued Medications   AMOXICILLIN (AMOXIL) 500 MG CAPSULE    Take 2,000 mg by mouth See admin instructions. Take 2,000 mg by mouth one hour prior to dental appointment    Physical Exam:  Vitals:   06/02/22 1352  BP: 122/64  Pulse: 92  Resp: 17  Temp: 97.6 F (36.4 C)  TempSrc: Temporal  SpO2: 98%  Weight: 162 lb 6.4 oz (73.7 kg)  Height: '5\' 9"'$  (1.753 m)   Body mass index is 23.98 kg/m. Wt Readings from Last 3 Encounters:  06/02/22 162 lb 6.4 oz (73.7 kg)  03/06/22 155 lb 9.6 oz (70.6 kg)  02/20/22 162 lb 6.4 oz (73.7 kg)    Physical Exam Constitutional:      General: He is not in acute distress.    Appearance: He is well-developed. He is not diaphoretic.  HENT:     Head: Normocephalic and atraumatic.     Right Ear: External ear normal.     Left Ear: External ear normal.     Mouth/Throat:     Pharynx: No oropharyngeal exudate.  Eyes:     Conjunctiva/sclera: Conjunctivae normal.     Pupils: Pupils are equal, round, and reactive to light.  Cardiovascular:     Rate and Rhythm: Normal rate and regular rhythm.     Heart sounds: Normal heart sounds.  Pulmonary:     Effort: Pulmonary effort is normal.     Breath sounds: Normal breath sounds.  Abdominal:      General: Bowel sounds are normal.     Palpations: Abdomen is soft.  Musculoskeletal:        General: No tenderness.     Cervical back: Normal range of motion and neck supple.     Right lower leg: No edema.     Left lower leg: No edema.  Skin:    General: Skin is warm and dry.  Neurological:     Mental Status: He is alert. Mental status is at baseline.  Psychiatric:        Mood and Affect: Mood normal.     Labs reviewed: Basic Metabolic Panel: Recent Labs    08/29/21 1453 10/06/21 1046 01/30/22 1433  NA 142 145 141  K 4.5 3.9 4.2  CL 102 106 105  CO2 35* 30 27  GLUCOSE 104* 111 146*  BUN 33* 20 25  CREATININE 1.82* 1.18 1.65*  CALCIUM 9.2 8.9 8.5*  TSH  --   --  1.65   Liver Function Tests: Recent Labs    08/29/21 1453 01/30/22 1433  AST 43* 34  ALT 36 26  BILITOT 0.6 0.7  PROT 7.0 6.5   No results for input(s): "LIPASE", "AMYLASE" in the last 8760 hours. No results for input(s): "AMMONIA" in the last 8760 hours. CBC: Recent Labs    08/29/21 1453 01/30/22 1433  WBC 11.0* 11.4*  NEUTROABS 7,403 9,052*  HGB 14.2 13.9  HCT 43.3 42.9  MCV 97.1 95.8  PLT 306 266   Lipid Panel: No results for input(s): "CHOL", "HDL", "LDLCALC", "TRIG", "CHOLHDL", "LDLDIRECT" in the last 8760 hours. TSH: Recent Labs    01/30/22 1433  TSH 1.65   A1C: Lab Results  Component Value Date   HGBA1C 5.9 (H) 01/30/2022     Assessment/Plan 1. Paroxysmal atrial fibrillation (HCC) -rate controlled on metoprolol, continues on xarelto  for anticoagulation.   2. Essential hypertension -Blood pressure well controlled, goal bp <140/90 Continue current medications and dietary modifications follow metabolic panel  3. Diastolic congestive heart failure, unspecified HF chronicity (HCC) -stable on torsemide with potassium supplement.   4. Type 2 diabetes mellitus with diabetic neuropathy, without long-term current use of insulin (HCC) -A1c at goal with dietary modifications.    5. Depression, major, single episode, mild ([place]) -controlled at this time, not taking any medication.   6. Benign prostatic hyperplasia with urinary frequency Continues to have frequency. On flomax daily.  7. Acquired thrombophilia ([place]) -due to a fib, on xarelto  8.insomnia Stable, no longer taking trazodone, will remove from medication list  9. Flu vaccine need - Flu Vaccine QUAD High Dose(Fluad)   Return in about 6 months (around 12/01/2022) for routine follow up. Carlos American. [place], [facility] [phone]

## 2022-06-03 LAB — CBC WITH DIFFERENTIAL/PLATELET
Absolute Monocytes: 603 cells/uL (ref 200–950)
Basophils Absolute: 23 cells/uL (ref 0–200)
Basophils Relative: 0.2 %
Eosinophils Absolute: 267 cells/uL (ref 15–500)
Eosinophils Relative: 2.3 %
HCT: 45.5 % (ref 38.5–50.0)
Hemoglobin: 15.4 g/dL (ref 13.2–17.1)
Lymphs Abs: 2134 cells/uL (ref 850–3900)
MCH: 32.2 pg (ref 27.0–33.0)
MCHC: 33.8 g/dL (ref 32.0–36.0)
MCV: 95 fL (ref 80.0–100.0)
MPV: 11.1 fL (ref 7.5–12.5)
Monocytes Relative: 5.2 %
Neutro Abs: 8572 cells/uL — ABNORMAL HIGH (ref 1500–7800)
Neutrophils Relative %: 73.9 %
Platelets: 235 10*3/uL (ref 140–400)
RBC: 4.79 10*6/uL (ref 4.20–5.80)
RDW: 13.1 % (ref 11.0–15.0)
Total Lymphocyte: 18.4 %
WBC: 11.6 10*3/uL — ABNORMAL HIGH (ref 3.8–10.8)

## 2022-06-03 LAB — COMPLETE METABOLIC PANEL WITH GFR
AG Ratio: 1.1 (calc) (ref 1.0–2.5)
ALT: 34 U/L (ref 9–46)
AST: 35 U/L (ref 10–35)
Albumin: 3.6 g/dL (ref 3.6–5.1)
Alkaline phosphatase (APISO): 280 U/L — ABNORMAL HIGH (ref 35–144)
BUN: 19 mg/dL (ref 7–25)
CO2: 29 mmol/L (ref 20–32)
Calcium: 8.9 mg/dL (ref 8.6–10.3)
Chloride: 105 mmol/L (ref 98–110)
Creat: 1.08 mg/dL (ref 0.70–1.22)
Globulin: 3.3 g/dL (calc) (ref 1.9–3.7)
Glucose, Bld: 122 mg/dL — ABNORMAL HIGH (ref 65–99)
Potassium: 3.8 mmol/L (ref 3.5–5.3)
Sodium: 143 mmol/L (ref 135–146)
Total Bilirubin: 0.9 mg/dL (ref 0.2–1.2)
Total Protein: 6.9 g/dL (ref 6.1–8.1)
eGFR: 67 mL/min/{1.73_m2} (ref >=60)

## 2022-06-06 ENCOUNTER — Other Ambulatory Visit: Payer: Self-pay | Admitting: Nurse Practitioner

## 2022-06-27 ENCOUNTER — Encounter: Payer: Self-pay | Admitting: Nurse Practitioner

## 2022-06-27 DIAGNOSIS — I4891 Unspecified atrial fibrillation: Secondary | ICD-10-CM | POA: Diagnosis not present

## 2022-06-27 DIAGNOSIS — I509 Heart failure, unspecified: Secondary | ICD-10-CM | POA: Diagnosis not present

## 2022-06-27 DIAGNOSIS — I69344 Monoplegia of lower limb following cerebral infarction affecting left non-dominant side: Secondary | ICD-10-CM | POA: Diagnosis not present

## 2022-06-27 DIAGNOSIS — F3341 Major depressive disorder, recurrent, in partial remission: Secondary | ICD-10-CM | POA: Diagnosis not present

## 2022-06-27 DIAGNOSIS — I252 Old myocardial infarction: Secondary | ICD-10-CM | POA: Diagnosis not present

## 2022-06-27 DIAGNOSIS — Z7901 Long term (current) use of anticoagulants: Secondary | ICD-10-CM | POA: Diagnosis not present

## 2022-06-27 DIAGNOSIS — I495 Sick sinus syndrome: Secondary | ICD-10-CM | POA: Diagnosis not present

## 2022-06-27 DIAGNOSIS — N529 Male erectile dysfunction, unspecified: Secondary | ICD-10-CM | POA: Diagnosis not present

## 2022-06-27 DIAGNOSIS — R32 Unspecified urinary incontinence: Secondary | ICD-10-CM | POA: Diagnosis not present

## 2022-06-27 DIAGNOSIS — R69 Illness, unspecified: Secondary | ICD-10-CM | POA: Diagnosis not present

## 2022-06-27 DIAGNOSIS — I110 Hypertensive heart disease with heart failure: Secondary | ICD-10-CM | POA: Diagnosis not present

## 2022-06-27 DIAGNOSIS — D6869 Other thrombophilia: Secondary | ICD-10-CM | POA: Diagnosis not present

## 2022-06-27 DIAGNOSIS — E119 Type 2 diabetes mellitus without complications: Secondary | ICD-10-CM | POA: Diagnosis not present

## 2022-06-27 NOTE — Telephone Encounter (Signed)
Please be aware daughter aware of labs

## 2022-06-28 NOTE — Telephone Encounter (Signed)
Noted  

## 2022-07-18 ENCOUNTER — Ambulatory Visit (INDEPENDENT_AMBULATORY_CARE_PROVIDER_SITE_OTHER): Payer: Medicare HMO

## 2022-07-18 DIAGNOSIS — I495 Sick sinus syndrome: Secondary | ICD-10-CM

## 2022-07-20 LAB — CUP PACEART REMOTE DEVICE CHECK
Battery Remaining Longevity: 128 mo
Battery Voltage: 3 V
Brady Statistic AP VP Percent: 0.22 %
Brady Statistic AP VS Percent: 99.6 %
Brady Statistic AS VP Percent: 0.01 %
Brady Statistic AS VS Percent: 0.18 %
Brady Statistic RA Percent Paced: 99.62 %
Brady Statistic RV Percent Paced: 0.27 %
Date Time Interrogation Session: 20231220174536
Implantable Lead Connection Status: 753985
Implantable Lead Connection Status: 753985
Implantable Lead Implant Date: 20201207
Implantable Lead Implant Date: 20201207
Implantable Lead Location: 753859
Implantable Lead Location: 753860
Implantable Lead Model: 5076
Implantable Lead Model: 5076
Implantable Pulse Generator Implant Date: 20201207
Lead Channel Impedance Value: 342 Ohm
Lead Channel Impedance Value: 380 Ohm
Lead Channel Impedance Value: 475 Ohm
Lead Channel Impedance Value: 475 Ohm
Lead Channel Pacing Threshold Amplitude: 0.625 V
Lead Channel Pacing Threshold Amplitude: 1.125 V
Lead Channel Pacing Threshold Pulse Width: 0.4 ms
Lead Channel Pacing Threshold Pulse Width: 0.4 ms
Lead Channel Sensing Intrinsic Amplitude: 2.125 mV
Lead Channel Sensing Intrinsic Amplitude: 2.125 mV
Lead Channel Sensing Intrinsic Amplitude: 31.625 mV
Lead Channel Sensing Intrinsic Amplitude: 31.625 mV
Lead Channel Setting Pacing Amplitude: 1.5 V
Lead Channel Setting Pacing Amplitude: 2.5 V
Lead Channel Setting Pacing Pulse Width: 0.4 ms
Lead Channel Setting Sensing Sensitivity: 1.2 mV
Zone Setting Status: 755011
Zone Setting Status: 755011

## 2022-08-14 ENCOUNTER — Other Ambulatory Visit: Payer: Self-pay | Admitting: Nurse Practitioner

## 2022-08-14 DIAGNOSIS — N401 Enlarged prostate with lower urinary tract symptoms: Secondary | ICD-10-CM

## 2022-08-14 DIAGNOSIS — I10 Essential (primary) hypertension: Secondary | ICD-10-CM

## 2022-08-14 DIAGNOSIS — I480 Paroxysmal atrial fibrillation: Secondary | ICD-10-CM

## 2022-08-14 NOTE — Progress Notes (Signed)
Remote pacemaker transmission.   

## 2022-08-31 ENCOUNTER — Encounter: Payer: Self-pay | Admitting: Nurse Practitioner

## 2022-08-31 ENCOUNTER — Ambulatory Visit: Payer: Medicare HMO | Admitting: Nurse Practitioner

## 2022-08-31 ENCOUNTER — Telehealth: Payer: Self-pay

## 2022-08-31 ENCOUNTER — Encounter: Payer: Medicare HMO | Admitting: Nurse Practitioner

## 2022-08-31 DIAGNOSIS — Z0000 Encounter for general adult medical examination without abnormal findings: Secondary | ICD-10-CM

## 2022-08-31 NOTE — Telephone Encounter (Signed)
[name], Jose Patel are scheduled for a virtual visit with your provider today.    Just as we do with appointments in the office, we must obtain your consent to participate.  Your consent will be active for this visit and any virtual visit you may have with one of our providers in the next 365 days.    If you have a MyChart account, I can also send a copy of this consent to you electronically.  All virtual visits are billed to your insurance company just like a traditional visit in the office.  As this is a virtual visit, video technology does not allow for your provider to perform a traditional examination.  This may limit your provider's ability to fully assess your condition.  If your provider identifies any concerns that need to be evaluated in person or the need to arrange testing such as labs, EKG, etc, we will make arrangements to do so.    Although advances in technology are sophisticated, we cannot ensure that it will always work on either your end or our end.  If the connection with a video visit is poor, we may have to switch to a telephone visit.  With either a video or telephone visit, we are not always able to ensure that we have a secure connection.   I need to obtain your verbal consent now.   Are you willing to proceed with your visit today?   Bill Yohn has provided verbal consent on 08/31/2022 for a virtual visit (video or telephone).   Carroll Kinds, CMA 08/31/2022  11:01 AM

## 2022-08-31 NOTE — Patient Instructions (Signed)
[name] , Thank you for taking time to come for your Medicare Wellness Visit. I appreciate your ongoing commitment to your health goals. Please review the following plan we discussed and let me know if I can assist you in the future.   Screening recommendations/referrals: Colonoscopy aged out Recommended yearly ophthalmology/optometry visit for glaucoma screening and checkup Recommended yearly dental visit for hygiene and checkup  Vaccinations: Influenza vaccine due annually in September/October Pneumococcal vaccine up to date Tdap vaccine up to date Shingles vaccine -declines 2nd vaccines     Advanced directives: on file.   Conditions/risks identified: advanced age, sedentary lifestyle.   Next appointment: yearly   Preventive Care 87 Years and Older, Male Preventive care refers to lifestyle choices and visits with your health care provider that can promote health and wellness. What does preventive care include? A yearly physical exam. This is also called an annual well check. Dental exams once or twice a year. Routine eye exams. Ask your health care provider how often you should have your eyes checked. Personal lifestyle choices, including: Daily care of your teeth and gums. Regular physical activity. Eating a healthy diet. Avoiding tobacco and drug use. Limiting alcohol use. Practicing safe sex. Taking low doses of aspirin every day. Taking vitamin and mineral supplements as recommended by your health care provider. What happens during an annual well check? The services and screenings done by your health care provider during your annual well check will depend on your age, overall health, lifestyle risk factors, and family history of disease. Counseling  Your health care provider may ask you questions about your: Alcohol use. Tobacco use. Drug use. Emotional well-being. Home and relationship well-being. Sexual activity. Eating habits. History of falls. Memory and  ability to understand (cognition). Work and work Statistician. Screening  You may have the following tests or measurements: Height, weight, and BMI. Blood pressure. Lipid and cholesterol levels. These may be checked every 5 years, or more frequently if you are over 58 years old. Skin check. Lung cancer screening. You may have this screening every year starting at age 35 if you have a 30-pack-year history of smoking and currently smoke or have quit within the past 15 years. Fecal occult blood test (FOBT) of the stool. You may have this test every year starting at age 58. Flexible sigmoidoscopy or colonoscopy. You may have a sigmoidoscopy every 5 years or a colonoscopy every 10 years starting at age 13. Prostate cancer screening. Recommendations will vary depending on your family history and other risks. Hepatitis C blood test. Hepatitis B blood test. Sexually transmitted disease (STD) testing. Diabetes screening. This is done by checking your blood sugar (glucose) after you have not eaten for a while (fasting). You may have this done every 1-3 years. Abdominal aortic aneurysm (AAA) screening. You may need this if you are a current or former smoker. Osteoporosis. You may be screened starting at age 87 if you are at high risk. Talk with your health care provider about your test results, treatment options, and if necessary, the need for more tests. Vaccines  Your health care provider may recommend certain vaccines, such as: Influenza vaccine. This is recommended every year. Tetanus, diphtheria, and acellular pertussis (Tdap, Td) vaccine. You may need a Td booster every 10 years. Zoster vaccine. You may need this after age 16. Pneumococcal 13-valent conjugate (PCV13) vaccine. One dose is recommended after age 79. Pneumococcal polysaccharide (PPSV23) vaccine. One dose is recommended after age 50. Talk to your health care provider  about which screenings and vaccines you need and how often you need  them. This information is not intended to replace advice given to you by your health care provider. Make sure you discuss any questions you have with your health care provider. Document Released: 08/13/2015 Document Revised: 04/05/2016 Document Reviewed: 05/18/2015 Elsevier Interactive Patient Education  2017 [place] Prevention in the Home Falls can cause injuries. They can happen to people of all ages. There are many things you can do to make your home safe and to help prevent falls. What can I do on the outside of my home? Regularly fix the edges of walkways and driveways and fix any cracks. Remove anything that might make you trip as you walk through a door, such as a raised step or threshold. Trim any bushes or trees on the path to your home. Use bright outdoor lighting. Clear any walking paths of anything that might make someone trip, such as rocks or tools. Regularly check to see if handrails are loose or broken. Make sure that both sides of any steps have handrails. Any raised decks and porches should have guardrails on the edges. Have any leaves, snow, or ice cleared regularly. Use sand or salt on walking paths during winter. Clean up any spills in your garage right away. This includes oil or grease spills. What can I do in the bathroom? Use night lights. Install grab bars by the toilet and in the tub and shower. Do not use towel bars as grab bars. Use non-skid mats or decals in the tub or shower. If you need to sit down in the shower, use a plastic, non-slip stool. Keep the floor dry. Clean up any water that spills on the floor as soon as it happens. Remove soap buildup in the tub or shower regularly. Attach bath mats securely with double-sided non-slip rug tape. Do not have throw rugs and other things on the floor that can make you trip. What can I do in the bedroom? Use night lights. Make sure that you have a light by your bed that is easy to reach. Do not use  any sheets or blankets that are too big for your bed. They should not hang down onto the floor. Have a firm chair that has side arms. You can use this for support while you get dressed. Do not have throw rugs and other things on the floor that can make you trip. What can I do in the kitchen? Clean up any spills right away. Avoid walking on wet floors. Keep items that you use a lot in easy-to-reach places. If you need to reach something above you, use a strong step stool that has a grab bar. Keep electrical cords out of the way. Do not use floor polish or wax that makes floors slippery. If you must use wax, use non-skid floor wax. Do not have throw rugs and other things on the floor that can make you trip. What can I do with my stairs? Do not leave any items on the stairs. Make sure that there are handrails on both sides of the stairs and use them. Fix handrails that are broken or loose. Make sure that handrails are as long as the stairways. Check any carpeting to make sure that it is firmly attached to the stairs. Fix any carpet that is loose or worn. Avoid having throw rugs at the top or bottom of the stairs. If you do have throw rugs, attach them to the floor  with carpet tape. Make sure that you have a light switch at the top of the stairs and the bottom of the stairs. If you do not have them, ask someone to add them for you. What else can I do to help prevent falls? Wear shoes that: Do not have high heels. Have rubber bottoms. Are comfortable and fit you well. Are closed at the toe. Do not wear sandals. If you use a stepladder: Make sure that it is fully opened. Do not climb a closed stepladder. Make sure that both sides of the stepladder are locked into place. Ask someone to hold it for you, if possible. Clearly mark and make sure that you can see: Any grab bars or handrails. First and last steps. Where the edge of each step is. Use tools that help you move around (mobility aids)  if they are needed. These include: Canes. Walkers. Scooters. Crutches. Turn on the lights when you go into a dark area. Replace any light bulbs as soon as they burn out. Set up your furniture so you have a clear path. Avoid moving your furniture around. If any of your floors are uneven, fix them. If there are any pets around you, be aware of where they are. Review your medicines with your doctor. Some medicines can make you feel dizzy. This can increase your chance of falling. Ask your doctor what other things that you can do to help prevent falls. This information is not intended to replace advice given to you by your health care provider. Make sure you discuss any questions you have with your health care provider. Document Released: 05/13/2009 Document Revised: 12/23/2015 Document Reviewed: 08/21/2014 Elsevier Interactive Patient Education  2017 [employer].

## 2022-08-31 NOTE — Progress Notes (Signed)
Subjective:   Jose Patel is a 89 y.o. male who presents for Medicare Annual/Subsequent preventive examination.  Review of Systems     Cardiac Risk Factors include: advanced age (>81mn, >>19women);sedentary lifestyle;dyslipidemia;hypertension     Objective:    There were no vitals filed for this visit. There is no height or weight on file to calculate BMI.     08/31/2022   10:54 AM 06/02/2022    1:49 PM 02/20/2022   10:44 AM 01/30/2022    2:08 PM 08/29/2021   11:18 AM 08/25/2021   11:06 AM 06/10/2021    2:11 PM  Advanced Directives  Does Patient Have a Medical Advance Directive? Yes Yes Yes Yes Yes Yes Yes  Type of AParamedicof ADeer CreekLiving will HLebanonLiving will HLake Clarke ShoresLiving will HRock HallLiving will HFerrisLiving will HCent[place]Living will HMadisonLiving will  Does patient want to make changes to medical advance directive? No - Patient declined No - Patient declined No - Patient declined No - Patient declined No - Patient declined No - Patient declined No - Patient declined  Copy of HT[place] Chart? Yes - validated most recent copy scanned in chart (See row information) Yes - validated most recent copy scanned in chart (See row information) Yes - validated most recent copy scanned in chart (See row information) Yes - validated most recent copy scanned in chart (See row information) Yes - validated most recent copy scanned in chart (See row information) Yes - validated most recent copy scanned in chart (See row information) Yes - validated most recent copy scanned in chart (See row information)    Current Medications (verified) Outpatient Encounter Medications as of 08/31/2022  Medication Sig   amiodarone (PACERONE) 200 MG tablet TAKE ONE TABLET BY MOUTH ONE TIME DAILY   losartan (COZAAR) 100 MG tablet TAKE ONE  TABLET BY MOUTH ONE TIME DAILY   memantine (NAMENDA) 10 MG tablet TAKE ONE TABLET BY MOUTH TWICE DAILY   metoprolol succinate (TOPROL-XL) 50 MG 24 hr tablet TAKE ONE TABLET BY MOUTH ONE TIME DAILY   potassium chloride (KLOR-CON M) 10 MEQ tablet TAKE ONE TABLET BY MOUTH ONE TIME DAILY   tamsulosin (FLOMAX) 0.4 MG CAPS capsule TAKE ONE CAPSULE BY MOUTH ONE TIME DAILY   torsemide (DEMADEX) 20 MG tablet Take 0.5 tablets (10 mg total) by mouth daily.   XARELTO 20 MG TABS tablet TAKE ONE TABLET BY MOUTH DAILY WITH SUPPER   [DISCONTINUED] Cholecalciferol (VITAMIN D3) 50 MCG (2000 UT) TABS Take 2,000 Units by mouth daily with breakfast.   [DISCONTINUED] Melatonin 5 MG TABS Take 5 mg by mouth at bedtime as needed (for sleep).    Facility-Administered Encounter Medications as of 08/31/2022  Medication   sodium chloride flush (NS) 0.9 % injection 3 mL    Allergies (verified) Aricept [donepezil], Tropicamide, and Adhesive [tape]   History: Past Medical History:  Diagnosis Date   A-fib ([place]    Anticoagulated on Coumadin    [facility]    Aortic regurgitation    Aortic stenosis    CHF (congestive heart failure) (HCC)    Coronary artery disease    COVID    CVA (cerebral vascular accident) ([place]    hx of CVA noted on CT from 02/19/18   Diabetes mellitus, type 2 (HCC)    Gastro-esophageal reflux disease without esophagitis    Per  records from previous provider, [facility]   Heart attack [facility])    History of CT scan of head 02/19/2018   Per records from previous provider, [facility]. Chronic changes small vessel disease   History of ECG    03/26/14- Sinus Tachycardia @ 107 bmp, QRS 78 msec, QT 298 msec, QTc 361 msec. 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 [facility])    Per records from previous provider, [facility]    MI (myocardial infarction) [facility])    Per records from [place]    Mitral regurgitation    Mitral stenosis    Other intervertebral disc degeneration, lumbar region    Per records from previous provider, [facility]mur[facility]   Radiculopathy, lumbar region    Per records from previous provider, [facility]   Sinus bradycardia    Per records from [place]    Sleep apnea    Transient ischemic attack    10 to 12 years ago   Typical atrial flutter [facility])    Per records from [place]    Past Surgical History:  Procedure Laterality Date   [place] N/A 12/26/2018   Procedure: CARDIOVERSION;  Surgeon: Pixie Casino, MD;  Location: [place];  Service: Cardiovascular;  Laterality: N/A;   CARDIOVERSION N/A 05/14/2019   Procedure: CARDIOVERSION;  Surgeon: [name], MD;  Location: [facility] ENDOSCOPY;  Service: Cardiovascular;  Laterality: N/A;   CATARACT EXTRACTION     Debridement to right arm with MRSA infection     4 years ago   GALLBLADDER SURGERY     Per records from [place] 07/07/2019   Procedure: PACEMAKER IMPLANT;  Surgeon: [name], MD;  Location: [place] CV LAB;  Service: Cardiovascular;  Laterality: N/A;   TEE WITHOUT CARDIOVERSION N/A 12/26/2018   Procedure: TRANSESOPHAGEAL ECHOCARDIOGRAM (TEE);  Surgeon: Pixie Casino, MD;  Location: [facility] ENDOSCOPY;  Service: Cardiovascular;  Laterality: N/A;   Family History  Problem Relation Age of Onset   Heart failure Mother    Hyperlipidemia Mother    Hypertension Son    Hyperlipidemia Son    Arthritis Son    Arthritis Daughter    Hypertension Son    Social History   Socioeconomic History   Marital status: Widowed    Spouse name: Not on file   Number of children:  Not on file   Years of education: Not on file   Highest education level: Not on file  Occupational History   Not on file  Tobacco Use   Smoking status: Former    Years: 1.00    Types: Cigarettes, Cigars    Quit date: 07/31/1953    Years since quitting: 53.1   Smokeless tobacco: Never   Tobacco comments:    Quit at age 25  Vaping Use   Vaping Use: Never used  Substance and Sexual Activity   Alcohol use: Never   Drug use: Never   Sexual activity: Not Currently  Other Topics Concern   Not on file  Social History Narrative   Social History      Diet?  Do you drink/eat things with caffeine? yes      Marital status?          married                          What year were you married? 1957      Do you live in a house, apartment, assisted living, condo, trailer, etc.? home      Is it one or more stories? 1      How many persons live in your home? 4      Do you have any pets in your home? (please list) yes- 3 dogs, 1 cat      Highest level of education completed? 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           no                           Type & how often?      Advanced Directives      Do you have a living will? yes      Do you have a DNR form?                                  If not, do you want to discuss one? no      Do you have signed POA/HPOA for forms? yes      Functional Status      Do you have difficulty bathing or dressing yourself? no      Do you have difficulty preparing food or eating? no      Do you have difficulty managing your medications? no      Do you have difficulty managing your finances? no      Do you have difficulty affording your medications? Yes xarelto   Social Determinants of Health   Financial Resource Strain: Not on file  Food Insecurity: Not on file  Transportation Needs: Not on file  Physical Activity: Not on file  Stress: Not on file  Social Connections:  Not on file    Tobacco Counseling Counseling given: Not Answered Tobacco comments: Quit at age 18   Clinical Intake:  Pre-visit preparation completed: Yes  Pain : No/denies pain     BMI - recorded: 23 Nutritional Status: BMI of 19-24  Normal Diabetes: No  How often do you need to have someone help you when you read instructions, pamphlets, or other written materials from your doctor or pharmacy?: 5 - Always (has daughter always help him)  Diabetic?no         Activities of Daily Living    08/31/2022   11:06 AM  In your present state of health, do you have any difficulty performing the following activities:  Hearing? 0  Vision? 0  Difficulty concentrating or making decisions? 1  Comment trouble remembering to take his medications.  Walking or climbing stairs? 1  Comment trouble with steps  Dressing or bathing? 0  Doing errands, shopping? 1  Comment daughter helps  Preparing Food and eating ? N  Using the Toilet? N  In the past six months, have you accidently leaked urine? Y  Do you have problems with loss of bowel control? N  Managing your  Medications? Y  Managing your Finances? Y  Comment daughter helps  Housekeeping or managing your Housekeeping? Y  Comment has someone who helps    Patient Care Team: [name], NP as PCP - General (Geriatric Medicine) [name], MD as PCP - Cardiology (Cardiology) Center, [facility] Endoscopy (Gastroenterology) [name], MD as Consulting Physician  Indicate any recent [place] you may have received from other than [facility] providers in the past year (date may be approximate).     Assessment:   This is a routine wellness examination for [facility]  Hearing/Vision screen Hearing Screening - Comments:: Patient has no problems with hearing. Vision Screening - Comments:: Patient has no vision problems. Patient due for yearly exam. Patient sees Dr. Willow Ora  Dietary issues and exercise activities  discussed: Current Exercise Habits: The patient does not participate in regular exercise at present   Goals Addressed   None   Depression Screen    08/31/2022   10:51 AM 06/02/2022    1:50 PM 08/25/2021   11:03 AM 02/25/2021    2:48 PM 08/23/2020    3:44 PM 04/08/2020    2:21 PM 08/20/2019    2:23 PM  PHQ 2/9 Scores  PHQ - 2 Score 0 2 0 0 0 0 0    Fall Risk    08/31/2022   10:53 AM 06/02/2022    1:50 PM 06/02/2022    1:48 PM 02/20/2022   10:44 AM 01/30/2022    2:07 PM  Fall Risk   Falls in the past year? '1 1 1 1 1  '$ Number falls in past yr: 0 1 0 1 1  Injury with Fall? 0 0 0 1 0  Risk for fall due to : No Fall Risks History of fall(s) History of fall(s);Impaired balance/gait;Impaired mobility History of fall(s);Impaired balance/gait History of fall(s);Impaired balance/gait;Impaired mobility  Follow up Falls evaluation completed Falls evaluation completed Falls evaluation completed Falls evaluation completed;Education provided;Falls prevention discussed Falls evaluation completed    FALL RISK PREVENTION PERTAINING TO THE HOME:  Any stairs in or around the home? No  If so, are there any without handrails?  na Home free of loose throw rugs in walkways, pet beds, electrical cords, etc? Yes  Adequate lighting in your home to reduce risk of falls? Yes   ASSISTIVE DEVICES UTILIZED TO PREVENT FALLS:  Life alert? Yes  Use of a cane, walker or w/c? Yes  Grab bars in the bathroom? Yes  Shower chair or bench in shower? Yes  Elevated toilet seat or a handicapped toilet? Yes   TIMED UP AND GO:  Was the test performed? No .    Cognitive Function:    08/12/2018   10:48 AM  MMSE - Mini Mental State Exam  Orientation to time 4  Orientation to Place 3  Registration 3  Attention/ Calculation 1  Recall 1  Language- name 2 objects 2  Language- repeat 1  Language- follow 3 step command 3  Language- read & follow direction 0  Write a sentence 0  Copy design 0  Total score 18         08/31/2022   10:54 AM 08/25/2021   11:07 AM 08/23/2020    3:45 PM 08/20/2019    2:23 PM  6CIT Screen  What Year? 0 points 0 points 0 points 0 points  What month? 0 points 0 points 0 points 0 points  What time? 0 points 0 points 0 points 0 points  Count back from 20  0 points 0 points 0 points 0 points  Months in reverse 4 points 4 points 0 points 4 points  Repeat phrase 4 points 4 points 2 points 8 points  Total Score 8 points 8 points 2 points 12 points    Immunizations Immunization History  Administered Date(s) Administered   Fluad Quad(high Dose 65+) 04/18/2019, 05/13/2021, 06/02/2022   Influenza, High Dose Seasonal PF 05/08/2018, 07/19/2020   Influenza-Unspecified 05/15/2017   PFIZER(Purple Top)SARS-COV-2 Vaccination 08/14/2019, 09/04/2019, 07/19/2020, 03/04/2021   PNEUMOCOCCAL CONJUGATE-20 08/29/2021   Pneumococcal Polysaccharide-23 07/21/2019   Tdap 07/31/2013   Zoster Recombinat (Shingrix) 08/12/2018    TDAP status: Up to date  Flu Vaccine status: Up to date  Pneumococcal vaccine status: Up to date  Covid-19 vaccine status: Information provided on how to obtain vaccines.   Qualifies for Shingles Vaccine? Yes   Zostavax completed No   Shingrix Completed?: No.    Education has been provided regarding the importance of this vaccine. Patient has been advised to call insurance company to determine out of pocket expense if they have not yet received this vaccine. Advised may also receive vaccine at local pharmacy or Health Dept. Verbalized acceptance and understanding.  Screening Tests Health Maintenance  Topic Date Due   OPHTHALMOLOGY EXAM  10/29/2021   FOOT EXAM  05/13/2022   HEMOGLOBIN A1C  08/02/2022   COVID-19 Vaccine (5 - 2023-24 season) 09/16/2022 (Originally 03/31/2022)   DTaP/Tdap/Td (2 - Td or Tdap) 08/01/2023   Medicare Annual Wellness (AWV)  09/01/2023   Pneumonia Vaccine 41+ Years old  Completed   INFLUENZA VACCINE  Completed   HPV VACCINES  Aged Out   Zoster  Vaccines- Shingrix  Discontinued    Health Maintenance  Health Maintenance Due  Topic Date Due   OPHTHALMOLOGY EXAM  10/29/2021   FOOT EXAM  05/13/2022   HEMOGLOBIN A1C  08/02/2022    Colorectal cancer screening: No longer required.   Lung Cancer Screening: (Low Dose CT Chest recommended if Age 5-80 years, 30 pack-year currently smoking OR have quit w/in 15years.) does not qualify.   Lung Cancer Screening Referral: na  Additional Screening:  Hepatitis C Screening: does not qualify; Completed  Vision Screening: Recommended annual ophthalmology exams for early detection of glaucoma and other disorders of the eye. Is the patient up to date with their annual eye exam?  Yes  Who is the provider or what is the name of the office in which the patient attends annual eye exams? [place] If pt is not established with a provider, would they like to be referred to a provider to establish care? No .   Dental Screening: Recommended annual dental exams for proper oral hygiene  Community Resource Referral / Chronic Care Management: CRR required this visit?  No   CCM required this visit?  No      Plan:     I have personally reviewed and noted the following in the patient's chart:   Medical and social history Use of alcohol, tobacco or illicit drugs  Current medications and supplements including opioid prescriptions. Patient is not currently taking opioid prescriptions. Functional ability and status Nutritional status Physical activity Advanced directives List of other physicians Hospitalizations, surgeries, and ER visits in previous 12 months Vitals Screenings to include cognitive, depression, and falls Referrals and appointments  In addition, I have reviewed and discussed with patient certain preventive protocols, quality metrics, and best practice recommendations. A written personalized care plan for preventive services as well as general preventive health recommendations were  provided to patient.     [name], NP   08/31/2022   Virtual Visit via video Note  I connected with patient 08/31/22 at 11:00 AM EST by video and verified that I am speaking with the correct person using two identifiers.  Location: Patient: home Provider: [place]   I discussed the limitations, risks, security and privacy concerns of performing an evaluation and management service by telephone and the availability of in person appointments. I also discussed with the patient that there may be a patient responsible charge related to this service. The patient expressed understanding and agreed to proceed.   I discussed the assessment and treatment plan with the patient. The patient was provided an opportunity to ask questions and all were answered. The patient agreed with the plan and demonstrated an understanding of the instructions.   The patient was advised to call back or seek an in-person evaluation if the symptoms worsen or if the condition fails to improve as anticipated.  I provided 15 minutes of non-face-to-face time during this encounter.  Carlos American. Harle Battiest Avs printed and mailed

## 2022-08-31 NOTE — Progress Notes (Signed)
This service is provided via telemedicine  No vital signs collected/recorded due to the encounter was a telemedicine visit.   Location of patient (ex: home, work):  Home  Patient consents to a telephone visit:  Yes, see encounter dated 08/31/2022  Location of the provider (ex: office, home):  [place]  Name of any referring provider:  N/A  Names of all persons participating in the telemedicine service and their role in the encounter:  Sherrie Mustache, Nurse Practitioner, Carroll Kinds, CMA, patient and daughter, [name].  Time spent on call:  11 minutes with medical assistant

## 2022-09-15 ENCOUNTER — Ambulatory Visit (HOSPITAL_COMMUNITY): Payer: Medicare HMO | Attending: Cardiovascular Disease

## 2022-09-15 DIAGNOSIS — I5042 Chronic combined systolic (congestive) and diastolic (congestive) heart failure: Secondary | ICD-10-CM

## 2022-09-15 LAB — ECHOCARDIOGRAM COMPLETE
Area-P 1/2: 2.45 cm2
MV M vel: 5.98 m/s
MV Peak grad: 143 mmHg
P 1/2 time: 536 msec
S' Lateral: 4.7 cm

## 2022-09-18 ENCOUNTER — Telehealth: Payer: Self-pay | Admitting: Cardiovascular Disease

## 2022-09-18 NOTE — Telephone Encounter (Signed)
Daughter states she was returning call. Please advise

## 2022-09-18 NOTE — Telephone Encounter (Signed)
pt daughter aware of results  Follow up scheduled

## 2022-09-25 ENCOUNTER — Ambulatory Visit: Payer: Medicare HMO | Attending: Cardiovascular Disease | Admitting: Cardiovascular Disease

## 2022-09-25 ENCOUNTER — Encounter: Payer: Self-pay | Admitting: Cardiovascular Disease

## 2022-09-25 ENCOUNTER — Telehealth: Payer: Self-pay | Admitting: *Deleted

## 2022-09-25 VITALS — BP 142/60 | HR 63 | Ht 70.0 in | Wt 140.4 lb

## 2022-09-25 DIAGNOSIS — G4733 Obstructive sleep apnea (adult) (pediatric): Secondary | ICD-10-CM | POA: Diagnosis not present

## 2022-09-25 DIAGNOSIS — E7800 Pure hypercholesterolemia, unspecified: Secondary | ICD-10-CM | POA: Diagnosis not present

## 2022-09-25 DIAGNOSIS — I351 Nonrheumatic aortic (valve) insufficiency: Secondary | ICD-10-CM | POA: Diagnosis not present

## 2022-09-25 DIAGNOSIS — I495 Sick sinus syndrome: Secondary | ICD-10-CM | POA: Diagnosis not present

## 2022-09-25 DIAGNOSIS — I4719 Other supraventricular tachycardia: Secondary | ICD-10-CM | POA: Diagnosis not present

## 2022-09-25 DIAGNOSIS — Z950 Presence of cardiac pacemaker: Secondary | ICD-10-CM

## 2022-09-25 DIAGNOSIS — I480 Paroxysmal atrial fibrillation: Secondary | ICD-10-CM | POA: Diagnosis not present

## 2022-09-25 DIAGNOSIS — Z5181 Encounter for therapeutic drug level monitoring: Secondary | ICD-10-CM

## 2022-09-25 DIAGNOSIS — I77810 Thoracic aortic ectasia: Secondary | ICD-10-CM | POA: Diagnosis not present

## 2022-09-25 DIAGNOSIS — I5042 Chronic combined systolic (congestive) and diastolic (congestive) heart failure: Secondary | ICD-10-CM

## 2022-09-25 DIAGNOSIS — I10 Essential (primary) hypertension: Secondary | ICD-10-CM

## 2022-09-25 DIAGNOSIS — E119 Type 2 diabetes mellitus without complications: Secondary | ICD-10-CM

## 2022-09-25 DIAGNOSIS — Z8679 Personal history of other diseases of the circulatory system: Secondary | ICD-10-CM

## 2022-09-25 DIAGNOSIS — D6869 Other thrombophilia: Secondary | ICD-10-CM | POA: Diagnosis not present

## 2022-09-25 DIAGNOSIS — Z79899 Other long term (current) drug therapy: Secondary | ICD-10-CM

## 2022-09-25 MED ORDER — EMPAGLIFLOZIN 10 MG PO TABS: 10.0000 mg | ORAL_TABLET | Freq: Every day | ORAL | 3 refills | Status: DC

## 2022-09-25 MED ORDER — ENTRESTO 49-51 MG PO TABS: 1.0000 | ORAL_TABLET | Freq: Two times a day (BID) | ORAL | 3 refills | Status: DC

## 2022-09-25 NOTE — Telephone Encounter (Signed)
REFILL 

## 2022-09-25 NOTE — Patient Instructions (Signed)
Medication Instructions:  Stop Losartan Stop Torsemide Start Entresto 49/51= 1 tablet twice a day  Start Jardiance '10mg'$ = 1tablet daily *If you need a refill on your cardiac medications before your next appointment, please call your pharmacy*   Lab Work:  CMET, TSH, Pro-BNP- in one month If you have labs (blood work) drawn today and your tests are completely normal, you will receive your results only by: [place] (if you have MyChart) OR A paper copy in the mail If you have any lab test that is abnormal or we need to change your treatment, we will call you to review the results.  Follow-Up: At [facility], you and your health needs are our priority.  As part of our continuing mission to provide you with exceptional heart care, we have created designated Provider Care Teams.  These Care Teams include your primary Cardiologist (physician) and Advanced Practice Providers (APPs -  Physician Assistants and Nurse Practitioners) who all work together to provide you with the care you need, when you need it.  We recommend signing up for the patient portal called "MyChart".  Sign up information is provided on this After Visit Summary.  MyChart is used to connect with patients for Virtual Visits (Telemedicine).  Patients are able to view lab/test results, encounter notes, upcoming appointments, etc.  Non-urgent messages can be sent to your provider as well.   To learn more about what you can do with MyChart, go to [email or web address].    Your next appointment:    2 months with Pharmacy  4-5 months with Dr Sallyanne Kuster- office visit
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
## 2022-09-26 ENCOUNTER — Encounter: Payer: Self-pay | Admitting: Cardiovascular Disease

## 2022-09-26 MED ORDER — ENTRESTO 49-51 MG PO TABS: 1.0000 | ORAL_TABLET | Freq: Two times a day (BID) | ORAL | 3 refills | Status: DC

## 2022-10-13 DIAGNOSIS — I252 Old myocardial infarction: Secondary | ICD-10-CM | POA: Diagnosis not present

## 2022-10-13 DIAGNOSIS — K5900 Constipation, unspecified: Secondary | ICD-10-CM | POA: Diagnosis not present

## 2022-10-13 DIAGNOSIS — I4891 Unspecified atrial fibrillation: Secondary | ICD-10-CM | POA: Diagnosis not present

## 2022-10-13 DIAGNOSIS — N529 Male erectile dysfunction, unspecified: Secondary | ICD-10-CM | POA: Diagnosis not present

## 2022-10-13 DIAGNOSIS — E1142 Type 2 diabetes mellitus with diabetic polyneuropathy: Secondary | ICD-10-CM | POA: Diagnosis not present

## 2022-10-13 DIAGNOSIS — F325 Major depressive disorder, single episode, in full remission: Secondary | ICD-10-CM | POA: Diagnosis not present

## 2022-10-13 DIAGNOSIS — M1990 Unspecified osteoarthritis, unspecified site: Secondary | ICD-10-CM | POA: Diagnosis not present

## 2022-10-13 DIAGNOSIS — R32 Unspecified urinary incontinence: Secondary | ICD-10-CM | POA: Diagnosis not present

## 2022-10-13 DIAGNOSIS — Z008 Encounter for other general examination: Secondary | ICD-10-CM | POA: Diagnosis not present

## 2022-10-13 DIAGNOSIS — D6869 Other thrombophilia: Secondary | ICD-10-CM | POA: Diagnosis not present

## 2022-10-13 DIAGNOSIS — G4733 Obstructive sleep apnea (adult) (pediatric): Secondary | ICD-10-CM | POA: Diagnosis not present

## 2022-10-13 DIAGNOSIS — I509 Heart failure, unspecified: Secondary | ICD-10-CM | POA: Diagnosis not present

## 2022-10-13 DIAGNOSIS — I2510 Atherosclerotic heart disease of native coronary artery without angina pectoris: Secondary | ICD-10-CM | POA: Diagnosis not present

## 2022-10-17 ENCOUNTER — Ambulatory Visit (INDEPENDENT_AMBULATORY_CARE_PROVIDER_SITE_OTHER): Payer: Medicare HMO

## 2022-10-17 DIAGNOSIS — I495 Sick sinus syndrome: Secondary | ICD-10-CM | POA: Diagnosis not present

## 2022-10-18 LAB — CUP PACEART REMOTE DEVICE CHECK
Battery Remaining Longevity: 123 mo
Battery Voltage: 3 V
Brady Statistic AP VP Percent: 0.43 %
Brady Statistic AP VS Percent: 99.52 %
Brady Statistic AS VP Percent: 0.01 %
Brady Statistic AS VS Percent: 0.04 %
Brady Statistic RA Percent Paced: 99.93 %
Brady Statistic RV Percent Paced: 0.44 %
Date Time Interrogation Session: 20240318233250
Implantable Lead Connection Status: 753985
Implantable Lead Connection Status: 753985
Implantable Lead Implant Date: 20201207
Implantable Lead Implant Date: 20201207
Implantable Lead Location: 753859
Implantable Lead Location: 753860
Implantable Lead Model: 5076
Implantable Lead Model: 5076
Implantable Pulse Generator Implant Date: 20201207
Lead Channel Impedance Value: 285 Ohm
Lead Channel Impedance Value: 342 Ohm
Lead Channel Impedance Value: 418 Ohm
Lead Channel Impedance Value: 456 Ohm
Lead Channel Pacing Threshold Amplitude: 0.625 V
Lead Channel Pacing Threshold Amplitude: 0.875 V
Lead Channel Pacing Threshold Pulse Width: 0.4 ms
Lead Channel Pacing Threshold Pulse Width: 0.4 ms
Lead Channel Sensing Intrinsic Amplitude: 1.625 mV
Lead Channel Sensing Intrinsic Amplitude: 1.625 mV
Lead Channel Sensing Intrinsic Amplitude: 27.5 mV
Lead Channel Sensing Intrinsic Amplitude: 27.5 mV
Lead Channel Setting Pacing Amplitude: 1.5 V
Lead Channel Setting Pacing Amplitude: 2.5 V
Lead Channel Setting Pacing Pulse Width: 0.4 ms
Lead Channel Setting Sensing Sensitivity: 1.2 mV
Zone Setting Status: 755011
Zone Setting Status: 755011

## 2022-11-01 ENCOUNTER — Telehealth: Payer: Self-pay | Admitting: Cardiovascular Disease

## 2022-11-01 DIAGNOSIS — I5042 Chronic combined systolic (congestive) and diastolic (congestive) heart failure: Secondary | ICD-10-CM | POA: Diagnosis not present

## 2022-11-01 NOTE — Telephone Encounter (Signed)
Spoke with daughter and gave information :  OK to take torsemide 10 mg scheduled - let's start off with every other day and we can tweak dose depending on the response.  Dr Sallyanne Kuster  Daughter states understanding and will start in the morning.  She will call on Monday to let us know how he is doing.

## 2022-11-01 NOTE — Telephone Encounter (Signed)
OK to take torsemide 10 mg scheduled - let's start off with every other day and we can tweak dose depending on the response.

## 2022-11-01 NOTE — Telephone Encounter (Signed)
Pt c/o swelling: STAT is pt has developed SOB within 24 hours  How much weight have you gained and in what time span?  Patient's daughter assumes patient has not gained weight, no readings available  If swelling, where is the swelling located?  Left arm and feet   Are you currently taking a fluid pill?  Yes   Are you currently SOB?  Not currently with the patient  Do you have a log of your daily weights (if so, list)?  No   Have you gained 3 pounds in a day or 5 pounds in a week?   Have you traveled recently?  No

## 2022-11-01 NOTE — Telephone Encounter (Signed)
Patient states father (patient) has increased swelling to left arm and bilateral feet.  She notes he usually starts with the swelling to left arm first. He then starts with being "weak in his voice and windy". No SOB or distress. He was given Torsemide 20mg  1/2 tablet last Wednesday and she is not sure if it helped as she has not checked on him as much since(due to family member I hospital).  She is now more attentive.  She states he "perked up on the Jardiance like the doctor said he would, however the change from Losartan to Delene Loll is not working as he will only take it once a day because he forgets the evening dose." She has no weights and states that the office had him at 140 his last visit but this was incorrect because weighing at home 2 days later he was 156.  Weighed while on phone and he is 158.2 Advised to continue to weigh daily as use today's weight as baseline. Ask to continue the Torsemide (which was Dc'd) or other recommendations.

## 2022-11-02 LAB — COMPREHENSIVE METABOLIC PANEL
ALT: 16 IU/L (ref 0–44)
AST: 19 IU/L (ref 0–40)
Albumin/Globulin Ratio: 1.2 (ref 1.2–2.2)
Albumin: 3.6 g/dL — ABNORMAL LOW (ref 3.7–4.7)
Alkaline Phosphatase: 211 IU/L — ABNORMAL HIGH (ref 44–121)
BUN/Creatinine Ratio: 13 (ref 10–24)
BUN: 15 mg/dL (ref 8–27)
Bilirubin Total: 1.1 mg/dL (ref 0.0–1.2)
CO2: 23 mmol/L (ref 20–29)
Calcium: 8.7 mg/dL (ref 8.6–10.2)
Chloride: 110 mmol/L — ABNORMAL HIGH (ref 96–106)
Creatinine, Ser: 1.19 mg/dL (ref 0.76–1.27)
Globulin, Total: 3 g/dL (ref 1.5–4.5)
Glucose: 125 mg/dL — ABNORMAL HIGH (ref 70–99)
Potassium: 3.6 mmol/L (ref 3.5–5.2)
Sodium: 147 mmol/L — ABNORMAL HIGH (ref 134–144)
Total Protein: 6.6 g/dL (ref 6.0–8.5)
eGFR: 59 mL/min/{1.73_m2} — ABNORMAL LOW (ref >59)

## 2022-11-02 LAB — TSH: TSH: 1.74 u[IU]/mL (ref 0.450–4.500)

## 2022-11-02 LAB — PRO B NATRIURETIC PEPTIDE: NT-Pro BNP: 27410 pg/mL — ABNORMAL HIGH (ref 0–486)

## 2022-11-08 ENCOUNTER — Encounter: Payer: Self-pay | Admitting: Cardiovascular Disease

## 2022-11-22 NOTE — Progress Notes (Signed)
Patient ID: Jose Patel                 DOB: 1933/08/08                      MRN: [id number]     HPI: Jose Patel is a 89 y.o. male referred by Dr. Royann Shivers to pharmacy clinic for HF medication management. 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 Most recent LVEF 25-30% on 09/15/2022.  Current HF medications Entresto 49/51 mg twice daily, Jardiance 10 mg daily, metoprolol XL 50 mg daily. Patient presented today with his daughter. Reports he forgets taking his medications some days specially the evening doses of twice daily medications. However it has improved with daily phone reminder. His wight has been stable in 153-155 lbs range. He does not have any fluid retention symptoms. He has been taking torsemide every other day. Denies  dizziness, lightheadedness, fatigue, chest pain or palpitations.  Able to complete all ADLs. Appetite has been normal. he generally adheres to a low-salt diet.    CrCl:44 ml/min   Adherence Assessment  Do you ever forget to take your medication? Once week  evening  [x] Yes [] No  Do you ever skip doses due to side effects? None  [] Yes [x] No  Do you have trouble affording your medicines? Yes will enroll him in [facility]  [x] Yes [] No  Are you ever unable to pick up your medication due to transportation difficulties? [] Yes [x] No  Do you ever stop taking your medications because you don't believe they are helping?  [x] Yes [] No  Do you check your weight daily? ~155lbs [x] Yes [] No   Adherence strategy: daughter sets pill box and call and reminds him to take his medications    Barriers to obtaining medications: cost- will enroll him in [facility]   BP goal: <130/80   Social History:  Alcohol: none Smoking: never  Diet: eats whatever he likes to eat but does not have large appetite as he use to. Watches salt intake  Exercise: none  Home BP readings: 140/60-70   Wt Readings from Last 3 Encounters:  09/25/22 140 lb 6.4 oz (63.7 kg)  06/02/22 162 lb 6.4 oz (73.7 kg)  03/06/22 155 lb 9.6 oz (70.6 kg)   BP Readings from Last 3 Encounters:  11/24/22 133/65  09/25/22 (!) 142/60  06/02/22 122/64   Pulse Readings from Last 3 Encounters:  11/24/22 61  09/25/22 63  06/02/22 92    Renal function: CrCl cannot be calculated (Patient's most recent lab result is older than the maximum 21 days allowed.).  Past Medical History:  Diagnosis Date   A-fib (HCC)    Anticoagulated on Coumadin    Per records from [facility]    Aortic regurgitation    Aortic stenosis    CHF (congestive heart failure) (HCC)    Coronary artery disease    COVID    CVA (cerebral vascular accident) (HCC)    hx of CVA noted on CT from 02/19/18   Diabetes mellitus, type 2 (HCC)    Gastro-esophageal reflux disease without esophagitis    Per records from previous provider, [facility]   Heart attack (HCC)  History of CT scan of head 02/19/2018   Per records from previous provider, [facility]. Chronic changes small vessel disease   History of ECG    03/26/14- Sinus Tachycardia @ 107 bmp, QRS 78 msec, QT 298 msec, QTc 361 msec. 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 [facility])    Per records from previous provider, [facility]    MI (myocardial infarction) [facility])    Per records from [facility]    Mitral  regurgitation    Mitral stenosis    Other intervertebral disc degeneration, lumbar region    Per records from previous provider, [facility]   Radiculopathy, lumbar region    Per records from previous provider, [facility]   Sinus bradycardia    Per records from [facility]    Sleep apnea    Transient ischemic attack    10 to 12 years ago   Typical atrial flutter (HCC)    Per records from [facility]     Current Outpatient Medications on File Prior to Visit  Medication Sig Dispense Refill   amiodarone (PACERONE) 200 MG tablet TAKE ONE TABLET BY MOUTH ONE TIME DAILY 90 tablet 0   empagliflozin (JARDIANCE) 10 MG TABS tablet Take 1 tablet (10 mg total) by mouth daily before breakfast. 90 tablet 3   memantine (NAMENDA) 10 MG tablet TAKE ONE TABLET BY MOUTH TWICE DAILY 180 tablet 3   metoprolol succinate (TOPROL-XL) 50 MG 24 hr tablet TAKE ONE TABLET BY MOUTH ONE TIME DAILY 90 tablet 3   sacubitril-valsartan (ENTRESTO) 49-51 MG Take 1 tablet by mouth 2 (two) times daily. 180 tablet 3   tamsulosin (FLOMAX) 0.4 MG CAPS capsule TAKE ONE CAPSULE BY MOUTH ONE TIME DAILY 90 capsule 0   XARELTO 20 MG TABS tablet TAKE ONE TABLET BY MOUTH DAILY WITH SUPPER 90 tablet 0   Current Facility-Administered Medications on File Prior to Visit  Medication Dose Route Frequency Provider Last Rate Last Admin   sodium chloride flush (NS) 0.9 % injection 3 mL  3 mL Intravenous Q12H Croitoru, Mihai, MD        Allergies  Allergen Reactions   Aricept [Donepezil] Diarrhea, Nausea And Vomiting and Other (See Comments)    Cannot tolerate- bradycardia, also   Tropicamide Nausea And Vomiting    Used to dilate eyes (might be this- was switched to something this was tolerable, after this)   Adhesive [Tape] Rash      Chronic combined systolic and diastolic heart failure (HCC) -     Basic metabolic panel  Acute on chronic combined systolic and  diastolic CHF (congestive heart failure) (HCC) Assessment & Plan: Assessment: BP is controlled in office BP 133/65 mmHg with heart rate 61 (goal <130/80) GDMT: on moderate dose ARNI, SGLT2i and betablocker, will add MRA low dose today  Tolerates them well without any side effects  Denies  dizziness, lightheadedness, fatigue, chest pain or palpitations.   Able to complete all ADLs  Swelling/edema has improved with short course every day torsemide - back to every other day regimen now  Will reduce potassium supplement dose from every day to every other day ( potential increment from [facility] addition and taking loop diuretics only every other day)  K level always on low side   Plan:  Start taking Spironolactone 25 mg daily  Continue taking Entresto 49/51 mg twice daily, Jardiance 10 mg daily, metoprolol XL 50 mg daily Cut down potassium 10 MEq from every day to every other day  Patient to keep record of BP readings with heart rate and report to [place] at the next visit Patient to see PharmD in 5 weeks for follow up  Follow up lab(s) BMP in 1 week of starting spironolactone      Other orders -     Spironolactone; Take 1 tablet (25 mg total) by mouth daily.  Dispense: 90 tablet; Refill: 3 -     Potassium Chloride ER; Take 1 tablet (10 mEq total) by mouth every other day.  Dispense: 90 tablet; Refill: 3       Thank you   Carmela Hurt, Pharm.D [facility] A Division of [facility] [address] [place], [place] [address]  Phone: [phone]; Fax: [phone]

## 2022-11-24 ENCOUNTER — Ambulatory Visit: Payer: Medicare HMO | Attending: Cardiovascular Disease | Admitting: Student

## 2022-11-24 VITALS — BP 133/65 | HR 61

## 2022-11-24 DIAGNOSIS — I5043 Acute on chronic combined systolic (congestive) and diastolic (congestive) heart failure: Secondary | ICD-10-CM | POA: Diagnosis not present

## 2022-11-24 DIAGNOSIS — I5042 Chronic combined systolic (congestive) and diastolic (congestive) heart failure: Secondary | ICD-10-CM

## 2022-11-24 MED ORDER — SPIRONOLACTONE 25 MG PO TABS: 25.0000 mg | ORAL_TABLET | Freq: Every day | ORAL | 3 refills | Status: DC

## 2022-11-24 MED ORDER — POTASSIUM CHLORIDE ER 10 MEQ PO TBCR: 10.0000 meq | EXTENDED_RELEASE_TABLET | ORAL | 3 refills | Status: DC

## 2022-11-24 NOTE — Assessment & Plan Note (Signed)
Assessment: BP is controlled in office BP 133/65 mmHg with heart rate 61 (goal <130/80) GDMT: on moderate dose ARNI, SGLT2i and betablocker, will add MRA low dose today  Tolerates them well without any side effects  Denies  dizziness, lightheadedness, fatigue, chest pain or palpitations.   Able to complete all ADLs  Swelling/edema has improved with short course every day torsemide - back to every other day regimen now  Will reduce potassium supplement dose from every day to every other day ( potential increment from [facility] addition and taking loop diuretics only every other day) K level always on low side   Plan:  Start taking Spironolactone 25 mg daily  Continue taking Entresto 49/51 mg twice daily, Jardiance 10 mg daily, metoprolol XL 50 mg daily Cut down potassium 10 MEq from every day to every other day  Patient to keep record of BP readings with heart rate and report to [place] at the next visit Patient to see PharmD in 5 weeks for follow up  Follow up lab(s) BMP in 1 week of starting spironolactone

## 2022-11-24 NOTE — Patient Instructions (Addendum)
Changes made by your pharmacist Carmela Hurt, PharmD at today's visit:    Instructions/Changes  (what do you need to do) Your Notes  (what you did and when you did it)  Continue taking Entresto 49/51 mg twice daily, Jardiance 10 mg daily, metoprolol XL 50 mg daily   2. Start taking spironolactone 25 mg daily    3. Start taking potassium 10 MEq every other day ( only the days when you takes torsemide)     Bring all of your meds, your BP cuff and your record of home blood pressures to your next appointment.    HOW TO TAKE YOUR BLOOD PRESSURE AT HOME  Rest 5 minutes before taking your blood pressure.  Don't smoke or drink caffeinated beverages for at least 30 minutes before. Take your blood pressure before (not after) you eat. Sit comfortably with your back supported and both feet on the floor (don't cross your legs). Elevate your arm to heart level on a table or a desk. Use the proper sized cuff. It should fit smoothly and snugly around your bare upper arm. There should be enough room to slip a fingertip under the cuff. The bottom edge of the cuff should be 1 inch above the crease of the elbow. Ideally, take 3 measurements at one sitting and record the average.  Important lifestyle changes to control high blood pressure  Intervention  Effect on the BP  Lose extra pounds and watch your waistline Weight loss is one of the most effective lifestyle changes for controlling blood pressure. If you're overweight or obese, losing even a small amount of weight can help reduce blood pressure. Blood pressure might go down by about 1 millimeter of mercury (mm Hg) with each kilogram (about 2.2 pounds) of weight lost.  Exercise regularly As a general goal, aim for at least 30 minutes of moderate physical activity every day. Regular physical activity can lower high blood pressure by about 5 to 8 mm Hg.  Eat a healthy diet Eating a diet rich in whole grains, fruits, vegetables, and low-fat dairy  products and low in saturated fat and cholesterol. A healthy diet can lower high blood pressure by up to 11 mm Hg.  Reduce salt (sodium) in your diet Even a small reduction of sodium in the diet can improve heart health and reduce high blood pressure by about 5 to 6 mm Hg.  Limit alcohol One drink equals 12 ounces of beer, 5 ounces of wine, or 1.5 ounces of 80-proof liquor.  Limiting alcohol to less than one drink a day for women or two drinks a day for men can help lower blood pressure by about 4 mm Hg.   If you have any questions or concerns please use My Chart to send questions or call the office at [phone]

## 2022-11-28 NOTE — Progress Notes (Signed)
Remote pacemaker transmission.   

## 2022-12-01 ENCOUNTER — Ambulatory Visit (INDEPENDENT_AMBULATORY_CARE_PROVIDER_SITE_OTHER): Payer: Medicare HMO | Admitting: Nurse Practitioner

## 2022-12-01 VITALS — BP 136/78 | HR 71 | Temp 97.6°F | Ht 70.0 in | Wt 154.0 lb

## 2022-12-01 DIAGNOSIS — R350 Frequency of micturition: Secondary | ICD-10-CM

## 2022-12-01 DIAGNOSIS — N401 Enlarged prostate with lower urinary tract symptoms: Secondary | ICD-10-CM

## 2022-12-01 DIAGNOSIS — F320 Major depressive disorder, single episode, mild: Secondary | ICD-10-CM

## 2022-12-01 DIAGNOSIS — E1140 Type 2 diabetes mellitus with diabetic neuropathy, unspecified: Secondary | ICD-10-CM | POA: Diagnosis not present

## 2022-12-01 DIAGNOSIS — I10 Essential (primary) hypertension: Secondary | ICD-10-CM | POA: Diagnosis not present

## 2022-12-01 DIAGNOSIS — I480 Paroxysmal atrial fibrillation: Secondary | ICD-10-CM | POA: Diagnosis not present

## 2022-12-01 DIAGNOSIS — D6869 Other thrombophilia: Secondary | ICD-10-CM

## 2022-12-01 DIAGNOSIS — I5030 Unspecified diastolic (congestive) heart failure: Secondary | ICD-10-CM | POA: Diagnosis not present

## 2022-12-01 NOTE — Progress Notes (Signed)
Careteam: Patient Care Team: [name], NP as PCP - General (Geriatric Medicine) Thurmon Fair, MD as PCP - Cardiology (Cardiology) Center, [facility] (Gastroenterology) Augustin Schooling, MD as Consulting Physician  PLACE OF SERVICE:  [facility]  Advanced Directive information    Allergies  Allergen Reactions   Aricept [Donepezil] Diarrhea, Nausea And Vomiting and Other (See Comments)    Cannot tolerate- bradycardia, also   Tropicamide Nausea And Vomiting    Used to dilate eyes (might be this- was switched to something this was tolerable, after this)   Adhesive [Tape] Rash    Chief Complaint  Patient presents with   Medical Management of Chronic Issues    Patient presents today for a 6 month follow-up   Quality Metric Gaps    Eye & foot exam, A1C, COVID#5     HPI: Patient is a 89 y.o. male for routine follow up.   Has not been to the eye doctor in 2 years. Reports he can see fine.   Placed on jardiance by cardiologist and noticed an increase in energy.level but then had increase in fluid build up. So now working with clinical pharmacist adjusting diuretic due to severity of his heart failure.  He is now on a 4 medication regimen.  Daughter is helping with medication management.   Would like labs drawn at [facility] next week  Reports he is "ready to go" Wife has already passed and he is not able to do the things he wants to do.   No recent steroid use, no wounds.  Review of Systems:  Review of Systems  Constitutional:  Negative for chills, fever and weight loss.  HENT:  Negative for tinnitus.   Respiratory:  Negative for cough, sputum production and shortness of breath.   Cardiovascular:  Negative for chest pain, palpitations and leg swelling.  Gastrointestinal:  Negative for abdominal pain, constipation, diarrhea and heartburn.  Genitourinary:  Negative for dysuria, frequency and urgency.  Musculoskeletal:  Negative for back pain, falls, joint pain  and myalgias.  Skin: Negative.   Neurological:  Positive for weakness. Negative for dizziness and headaches.  Psychiatric/Behavioral:  Positive for memory loss. Negative for depression. The patient does not have insomnia.     Past Medical History:  Diagnosis Date   A-fib (HCC)    Anticoagulated on Coumadin    Per records from [facility]    Aortic regurgitation    Aortic stenosis    CHF (congestive heart failure) (HCC)    Coronary artery disease    COVID    CVA (cerebral vascular accident) (HCC)    hx of CVA noted on CT from 02/19/18   Diabetes mellitus, type 2 (HCC)    Gastro-esophageal reflux disease without esophagitis    Per records from [facility], [facility]   Heart attack [facility])    History of CT scan of head 02/19/2018   Per records from previous provider, [facility]. Chronic changes small vessel disease   History of ECG    03/26/14- Sinus Tachycardia @ 107 bmp, QRS 78 msec, QT 298 msec, QTc 361 msec. 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 [facility])    Per records from previous provider, [facility]    MI (myocardial  infarction) [facility])    Per records from [facility]    Mitral regurgitation    Mitral stenosis    Other intervertebral disc degeneration, lumbar region    Per records from previous provider, [facility]   Radiculopathy, lumbar region    Per records from previous provider, [facility]   Sinus bradycardia    Per records from [facility]    Sleep apnea    Transient ischemic attack    10 to 12 years ago   Typical atrial flutter [facility])    Per records from [facility]    Past Surgical History:  Procedure Laterality Date    CARDIAC ELECTROPHYSIOLOGY STUDY AND ABLATION     CARDIOVERSION N/A 12/26/2018   Procedure: CARDIOVERSION;  Surgeon: Chrystie Nose, MD;  Location: [facility] ENDOSCOPY;  Service: Cardiovascular;  Laterality: N/A;   CARDIOVERSION N/A 05/14/2019   Procedure: CARDIOVERSION;  Surgeon: Thurmon Fair, MD;  Location: [facility] ENDOSCOPY;  Service: Cardiovascular;  Laterality: N/A;   CATARACT EXTRACTION     Debridement to right arm with MRSA infection     4 years ago   GALLBLADDER SURGERY     Per records from [facility]    PACEMAKER IMPLANT N/A 07/07/2019   Procedure: PACEMAKER IMPLANT;  Surgeon: Thurmon Fair, MD;  Location: [facility] INVASIVE CV LAB;  Service: Cardiovascular;  Laterality: N/A;   TEE WITHOUT CARDIOVERSION N/A 12/26/2018   Procedure: TRANSESOPHAGEAL ECHOCARDIOGRAM (TEE);  Surgeon: Chrystie Nose, MD;  Location: [facility] ENDOSCOPY;  Service: Cardiovascular;  Laterality: N/A;   Social History:   reports that he quit smoking about 69 years ago. His smoking use included cigarettes and cigars. He has never used smokeless tobacco. He reports that he does not drink alcohol and does not use drugs.  Family History  Problem Relation Age of Onset   Heart failure Mother    Hyperlipidemia Mother    Hypertension Son    Hyperlipidemia Son    Arthritis Son    Arthritis Daughter    Hypertension Son     Medications: Patient's Medications  New Prescriptions   No medications on file  Previous Medications   AMIODARONE (PACERONE) 200 MG TABLET    TAKE ONE TABLET BY MOUTH ONE TIME DAILY   EMPAGLIFLOZIN (JARDIANCE) 10 MG TABS TABLET    Take 1 tablet (10 mg total) by mouth daily before breakfast.   MEMANTINE (NAMENDA) 10 MG TABLET    TAKE ONE TABLET BY MOUTH TWICE DAILY   METOPROLOL SUCCINATE (TOPROL-XL) 50 MG 24 HR TABLET    TAKE ONE TABLET BY MOUTH ONE TIME DAILY   POTASSIUM CHLORIDE (KLOR-CON) 10 MEQ TABLET    Take 1 tablet (10 mEq total) by mouth every other day.   SACUBITRIL-VALSARTAN (ENTRESTO) 49-51 MG     Take 1 tablet by mouth 2 (two) times daily.   SPIRONOLACTONE (ALDACTONE) 25 MG TABLET    Take 1 tablet (25 mg total) by mouth daily.   TAMSULOSIN (FLOMAX) 0.4 MG CAPS CAPSULE    TAKE ONE CAPSULE BY MOUTH ONE TIME DAILY   XARELTO 20 MG TABS TABLET    TAKE ONE TABLET BY MOUTH DAILY WITH SUPPER  Modified Medications   No medications on file  Discontinued Medications   No medications on file    Physical Exam:  Vitals:   12/01/22 1252  BP: 136/78  Pulse: 71  Temp: 97.6 F (36.4 C)  SpO2: 99%  Weight: 154 lb (69.9 kg)  Height:  5\' 10"  (1.778 m)   Body mass index is 22.1 kg/m. Wt Readings from Last 3 Encounters:  12/01/22 154 lb (69.9 kg)  09/25/22 140 lb 6.4 oz (63.7 kg)  06/02/22 162 lb 6.4 oz (73.7 kg)    Physical Exam Constitutional:      General: He is not in acute distress.    Appearance: He is well-developed. He is not diaphoretic.  HENT:     Head: Normocephalic and atraumatic.     Right Ear: External ear normal.     Left Ear: External ear normal.     Mouth/Throat:     Pharynx: No oropharyngeal exudate.  Eyes:     Conjunctiva/sclera: Conjunctivae normal.     Pupils: Pupils are equal, round, and reactive to light.  Cardiovascular:     Rate and Rhythm: Normal rate and regular rhythm.     Heart sounds: Normal heart sounds.  Pulmonary:     Effort: Pulmonary effort is normal.     Breath sounds: Normal breath sounds.  Abdominal:     General: Bowel sounds are normal.     Palpations: Abdomen is soft.  Musculoskeletal:        General: No tenderness.     Cervical back: Normal range of motion and neck supple.     Right lower leg: No edema.     Left lower leg: No edema.  Skin:    General: Skin is warm and dry.  Neurological:     Mental Status: He is alert and oriented to person, place, and time. Mental status is at baseline.     Motor: Weakness present.  Psychiatric:        Mood and Affect: Mood normal.     Labs reviewed: Basic Metabolic Panel: Recent Labs     01/30/22 1433 06/02/22 1414 11/01/22 1521  NA 141 143 147*  K 4.2 3.8 3.6  CL 105 105 110*  CO2 27 29 23   GLUCOSE 146* 122* 125*  BUN 25 19 15   CREATININE 1.65* 1.08 1.19  CALCIUM 8.5* 8.9 8.7  TSH 1.65  --  1.740   Liver Function Tests: Recent Labs    01/30/22 1433 06/02/22 1414 11/01/22 1521  AST 34 35 19  ALT 26 34 16  ALKPHOS  --   --  211*  BILITOT 0.7 0.9 1.1  PROT 6.5 6.9 6.6  ALBUMIN  --   --  3.6*   No results for input(s): "LIPASE", "AMYLASE" in the last 8760 hours. No results for input(s): "AMMONIA" in the last 8760 hours. CBC: Recent Labs    01/30/22 1433 06/02/22 1414  WBC 11.4* 11.6*  NEUTROABS 9,052* 8,572*  HGB 13.9 15.4  HCT 42.9 45.5  MCV 95.8 95.0  PLT 266 235   Lipid Panel: No results for input(s): "CHOL", "HDL", "LDLCALC", "TRIG", "CHOLHDL", "LDLDIRECT" in the last 8760 hours. TSH: Recent Labs    01/30/22 1433 11/01/22 1521  TSH 1.65 1.740   A1C: Lab Results  Component Value Date   HGBA1C 5.9 (H) 01/30/2022     Assessment/Plan 1. Type 2 diabetes mellitus with diabetic neuropathy, without long-term current use of insulin (HCC) Diet controlled. Encouraged dietary compliance, routine foot care/monitoring and to keep up with diabetic eye exams through ophthalmology  - Hemoglobin A1c - CBC with Differential/Platelet - Complete Metabolic Panel with eGFR - Ambulatory referral to Home Health  2. Essential hypertension -Blood pressure well controlled, goal bp <140/90 Continue current medications and dietary modifications follow metabolic panel - CBC with  Differential/Platelet - Complete Metabolic Panel with eGFR - Ambulatory referral to Home Health  3. Paroxysmal atrial fibrillation (HCC) -rate controlled continues to follow up with cardiology - Ambulatory referral to Home Health  4. Diastolic congestive heart failure, unspecified HF chronicity (HCC) Stable at this time, continues to meet with pharmacist to maximize therapy to  help improve EF and improve QOL.  - Ambulatory referral to Home Health  5. Acquired thrombophilia (HCC) -due to a fib, continues on xarelto  6. Benign prostatic hyperplasia with urinary frequency Stable on flomax  7. Depression, major, single episode, mild (HCC) -not currently on medication, stable at this time.     Return in about 6 months (around 06/03/2023) for routine follow up.  Janene Harvey. Biagio Borg [facility] [phone]

## 2022-12-03 DIAGNOSIS — K219 Gastro-esophageal reflux disease without esophagitis: Secondary | ICD-10-CM | POA: Diagnosis not present

## 2022-12-03 DIAGNOSIS — G4733 Obstructive sleep apnea (adult) (pediatric): Secondary | ICD-10-CM | POA: Diagnosis not present

## 2022-12-03 DIAGNOSIS — I080 Rheumatic disorders of both mitral and aortic valves: Secondary | ICD-10-CM | POA: Diagnosis not present

## 2022-12-03 DIAGNOSIS — E1140 Type 2 diabetes mellitus with diabetic neuropathy, unspecified: Secondary | ICD-10-CM | POA: Diagnosis not present

## 2022-12-03 DIAGNOSIS — F320 Major depressive disorder, single episode, mild: Secondary | ICD-10-CM | POA: Diagnosis not present

## 2022-12-03 DIAGNOSIS — Z7901 Long term (current) use of anticoagulants: Secondary | ICD-10-CM | POA: Diagnosis not present

## 2022-12-03 DIAGNOSIS — I495 Sick sinus syndrome: Secondary | ICD-10-CM | POA: Diagnosis not present

## 2022-12-03 DIAGNOSIS — R350 Frequency of micturition: Secondary | ICD-10-CM | POA: Diagnosis not present

## 2022-12-03 DIAGNOSIS — Z9849 Cataract extraction status, unspecified eye: Secondary | ICD-10-CM | POA: Diagnosis not present

## 2022-12-03 DIAGNOSIS — M5416 Radiculopathy, lumbar region: Secondary | ICD-10-CM | POA: Diagnosis not present

## 2022-12-03 DIAGNOSIS — I484 Atypical atrial flutter: Secondary | ICD-10-CM | POA: Diagnosis not present

## 2022-12-03 DIAGNOSIS — E559 Vitamin D deficiency, unspecified: Secondary | ICD-10-CM | POA: Diagnosis not present

## 2022-12-03 DIAGNOSIS — E785 Hyperlipidemia, unspecified: Secondary | ICD-10-CM | POA: Diagnosis not present

## 2022-12-03 DIAGNOSIS — M5137 Other intervertebral disc degeneration, lumbosacral region: Secondary | ICD-10-CM | POA: Diagnosis not present

## 2022-12-03 DIAGNOSIS — I252 Old myocardial infarction: Secondary | ICD-10-CM | POA: Diagnosis not present

## 2022-12-03 DIAGNOSIS — Z8673 Personal history of transient ischemic attack (TIA), and cerebral infarction without residual deficits: Secondary | ICD-10-CM | POA: Diagnosis not present

## 2022-12-03 DIAGNOSIS — Z950 Presence of cardiac pacemaker: Secondary | ICD-10-CM | POA: Diagnosis not present

## 2022-12-03 DIAGNOSIS — I2510 Atherosclerotic heart disease of native coronary artery without angina pectoris: Secondary | ICD-10-CM | POA: Diagnosis not present

## 2022-12-03 DIAGNOSIS — N401 Enlarged prostate with lower urinary tract symptoms: Secondary | ICD-10-CM | POA: Diagnosis not present

## 2022-12-03 DIAGNOSIS — Z7984 Long term (current) use of oral hypoglycemic drugs: Secondary | ICD-10-CM | POA: Diagnosis not present

## 2022-12-03 DIAGNOSIS — D6859 Other primary thrombophilia: Secondary | ICD-10-CM | POA: Diagnosis not present

## 2022-12-03 DIAGNOSIS — I480 Paroxysmal atrial fibrillation: Secondary | ICD-10-CM | POA: Diagnosis not present

## 2022-12-03 DIAGNOSIS — I110 Hypertensive heart disease with heart failure: Secondary | ICD-10-CM | POA: Diagnosis not present

## 2022-12-03 DIAGNOSIS — Z8616 Personal history of COVID-19: Secondary | ICD-10-CM | POA: Diagnosis not present

## 2022-12-03 DIAGNOSIS — I5043 Acute on chronic combined systolic (congestive) and diastolic (congestive) heart failure: Secondary | ICD-10-CM | POA: Diagnosis not present

## 2022-12-03 DIAGNOSIS — I77810 Thoracic aortic ectasia: Secondary | ICD-10-CM | POA: Diagnosis not present

## 2022-12-04 ENCOUNTER — Other Ambulatory Visit: Payer: Self-pay | Admitting: Nurse Practitioner

## 2022-12-04 ENCOUNTER — Encounter: Payer: Self-pay | Admitting: Cardiovascular Disease

## 2022-12-04 DIAGNOSIS — I480 Paroxysmal atrial fibrillation: Secondary | ICD-10-CM

## 2022-12-04 DIAGNOSIS — N401 Enlarged prostate with lower urinary tract symptoms: Secondary | ICD-10-CM

## 2022-12-05 ENCOUNTER — Other Ambulatory Visit: Payer: Self-pay | Admitting: Nurse Practitioner

## 2022-12-05 DIAGNOSIS — M5137 Other intervertebral disc degeneration, lumbosacral region: Secondary | ICD-10-CM | POA: Diagnosis not present

## 2022-12-05 DIAGNOSIS — I484 Atypical atrial flutter: Secondary | ICD-10-CM | POA: Diagnosis not present

## 2022-12-05 DIAGNOSIS — I480 Paroxysmal atrial fibrillation: Secondary | ICD-10-CM | POA: Diagnosis not present

## 2022-12-05 DIAGNOSIS — Z950 Presence of cardiac pacemaker: Secondary | ICD-10-CM | POA: Diagnosis not present

## 2022-12-05 DIAGNOSIS — Z8616 Personal history of COVID-19: Secondary | ICD-10-CM | POA: Diagnosis not present

## 2022-12-05 DIAGNOSIS — E1140 Type 2 diabetes mellitus with diabetic neuropathy, unspecified: Secondary | ICD-10-CM | POA: Diagnosis not present

## 2022-12-05 DIAGNOSIS — Z7901 Long term (current) use of anticoagulants: Secondary | ICD-10-CM | POA: Diagnosis not present

## 2022-12-05 DIAGNOSIS — N401 Enlarged prostate with lower urinary tract symptoms: Secondary | ICD-10-CM | POA: Diagnosis not present

## 2022-12-05 DIAGNOSIS — E785 Hyperlipidemia, unspecified: Secondary | ICD-10-CM | POA: Diagnosis not present

## 2022-12-05 DIAGNOSIS — I110 Hypertensive heart disease with heart failure: Secondary | ICD-10-CM | POA: Diagnosis not present

## 2022-12-05 DIAGNOSIS — I495 Sick sinus syndrome: Secondary | ICD-10-CM | POA: Diagnosis not present

## 2022-12-05 DIAGNOSIS — I252 Old myocardial infarction: Secondary | ICD-10-CM | POA: Diagnosis not present

## 2022-12-05 DIAGNOSIS — F320 Major depressive disorder, single episode, mild: Secondary | ICD-10-CM | POA: Diagnosis not present

## 2022-12-05 DIAGNOSIS — R350 Frequency of micturition: Secondary | ICD-10-CM | POA: Diagnosis not present

## 2022-12-05 DIAGNOSIS — I080 Rheumatic disorders of both mitral and aortic valves: Secondary | ICD-10-CM | POA: Diagnosis not present

## 2022-12-05 DIAGNOSIS — I77810 Thoracic aortic ectasia: Secondary | ICD-10-CM | POA: Diagnosis not present

## 2022-12-05 DIAGNOSIS — Z8673 Personal history of transient ischemic attack (TIA), and cerebral infarction without residual deficits: Secondary | ICD-10-CM | POA: Diagnosis not present

## 2022-12-05 DIAGNOSIS — G4733 Obstructive sleep apnea (adult) (pediatric): Secondary | ICD-10-CM | POA: Diagnosis not present

## 2022-12-05 DIAGNOSIS — E559 Vitamin D deficiency, unspecified: Secondary | ICD-10-CM | POA: Diagnosis not present

## 2022-12-05 DIAGNOSIS — I5043 Acute on chronic combined systolic (congestive) and diastolic (congestive) heart failure: Secondary | ICD-10-CM | POA: Diagnosis not present

## 2022-12-05 DIAGNOSIS — I5042 Chronic combined systolic (congestive) and diastolic (congestive) heart failure: Secondary | ICD-10-CM | POA: Diagnosis not present

## 2022-12-05 DIAGNOSIS — I10 Essential (primary) hypertension: Secondary | ICD-10-CM | POA: Diagnosis not present

## 2022-12-05 DIAGNOSIS — M5416 Radiculopathy, lumbar region: Secondary | ICD-10-CM | POA: Diagnosis not present

## 2022-12-05 DIAGNOSIS — K219 Gastro-esophageal reflux disease without esophagitis: Secondary | ICD-10-CM | POA: Diagnosis not present

## 2022-12-05 DIAGNOSIS — Z9849 Cataract extraction status, unspecified eye: Secondary | ICD-10-CM | POA: Diagnosis not present

## 2022-12-05 DIAGNOSIS — D6859 Other primary thrombophilia: Secondary | ICD-10-CM | POA: Diagnosis not present

## 2022-12-05 DIAGNOSIS — I2510 Atherosclerotic heart disease of native coronary artery without angina pectoris: Secondary | ICD-10-CM | POA: Diagnosis not present

## 2022-12-06 LAB — CBC/DIFF AMBIGUOUS DEFAULT
Basophils Absolute: 0 10*3/uL (ref 0.0–0.2)
Basos: 0 %
EOS (ABSOLUTE): 0.2 10*3/uL (ref 0.0–0.4)
Eos: 2 %
Hematocrit: 46.1 % (ref 37.5–51.0)
Hemoglobin: 14.7 g/dL (ref 13.0–17.7)
Immature Grans (Abs): 0 10*3/uL (ref 0.0–0.1)
Immature Granulocytes: 0 %
Lymphocytes Absolute: 2 10*3/uL (ref 0.7–3.1)
Lymphs: 24 %
MCH: 30.9 pg (ref 26.6–33.0)
MCHC: 31.9 g/dL (ref 31.5–35.7)
MCV: 97 fL (ref 79–97)
Monocytes Absolute: 0.5 10*3/uL (ref 0.1–0.9)
Monocytes: 6 %
Neutrophils Absolute: 5.8 10*3/uL (ref 1.4–7.0)
Neutrophils: 68 %
Platelets: 263 10*3/uL (ref 150–450)
RBC: 4.75 x10E6/uL (ref 4.14–5.80)
RDW: 13.2 % (ref 11.6–15.4)
WBC: 8.6 10*3/uL (ref 3.4–10.8)

## 2022-12-06 LAB — BASIC METABOLIC PANEL
BUN/Creatinine Ratio: 20 (ref 10–24)
BUN: 26 mg/dL (ref 8–27)
CO2: 21 mmol/L (ref 20–29)
Calcium: 9 mg/dL (ref 8.6–10.2)
Chloride: 106 mmol/L (ref 96–106)
Creatinine, Ser: 1.33 mg/dL — ABNORMAL HIGH (ref 0.76–1.27)
Glucose: 158 mg/dL — ABNORMAL HIGH (ref 70–99)
Potassium: 4.5 mmol/L (ref 3.5–5.2)
Sodium: 147 mmol/L — ABNORMAL HIGH (ref 134–144)
eGFR: 52 mL/min/{1.73_m2} — ABNORMAL LOW (ref >59)

## 2022-12-06 LAB — COMPREHENSIVE METABOLIC PANEL
ALT: 20 IU/L (ref 0–44)
AST: 23 IU/L (ref 0–40)
Albumin/Globulin Ratio: 1.4 (ref 1.2–2.2)
Albumin: 3.9 g/dL (ref 3.7–4.7)
Alkaline Phosphatase: 211 IU/L — ABNORMAL HIGH (ref 44–121)
BUN/Creatinine Ratio: 20 (ref 10–24)
BUN: 26 mg/dL (ref 8–27)
Bilirubin Total: 0.8 mg/dL (ref 0.0–1.2)
CO2: 25 mmol/L (ref 20–29)
Calcium: 8.9 mg/dL (ref 8.6–10.2)
Chloride: 104 mmol/L (ref 96–106)
Creatinine, Ser: 1.31 mg/dL — ABNORMAL HIGH (ref 0.76–1.27)
Globulin, Total: 2.8 g/dL (ref 1.5–4.5)
Glucose: 153 mg/dL — ABNORMAL HIGH (ref 70–99)
Potassium: 4.3 mmol/L (ref 3.5–5.2)
Sodium: 143 mmol/L (ref 134–144)
Total Protein: 6.7 g/dL (ref 6.0–8.5)
eGFR: 53 mL/min/{1.73_m2} — ABNORMAL LOW (ref >59)

## 2022-12-06 LAB — HGB A1C W/O EAG: Hgb A1c MFr Bld: 5.9 % — ABNORMAL HIGH (ref 4.8–5.6)

## 2022-12-06 LAB — SPECIMEN STATUS REPORT

## 2022-12-07 ENCOUNTER — Other Ambulatory Visit: Payer: Self-pay | Admitting: Nurse Practitioner

## 2022-12-07 DIAGNOSIS — I480 Paroxysmal atrial fibrillation: Secondary | ICD-10-CM

## 2022-12-08 DIAGNOSIS — I480 Paroxysmal atrial fibrillation: Secondary | ICD-10-CM | POA: Diagnosis not present

## 2022-12-08 DIAGNOSIS — D6859 Other primary thrombophilia: Secondary | ICD-10-CM | POA: Diagnosis not present

## 2022-12-08 DIAGNOSIS — M5137 Other intervertebral disc degeneration, lumbosacral region: Secondary | ICD-10-CM | POA: Diagnosis not present

## 2022-12-08 DIAGNOSIS — E559 Vitamin D deficiency, unspecified: Secondary | ICD-10-CM | POA: Diagnosis not present

## 2022-12-08 DIAGNOSIS — I495 Sick sinus syndrome: Secondary | ICD-10-CM | POA: Diagnosis not present

## 2022-12-08 DIAGNOSIS — I252 Old myocardial infarction: Secondary | ICD-10-CM | POA: Diagnosis not present

## 2022-12-08 DIAGNOSIS — I5043 Acute on chronic combined systolic (congestive) and diastolic (congestive) heart failure: Secondary | ICD-10-CM | POA: Diagnosis not present

## 2022-12-08 DIAGNOSIS — Z7901 Long term (current) use of anticoagulants: Secondary | ICD-10-CM | POA: Diagnosis not present

## 2022-12-08 DIAGNOSIS — R350 Frequency of micturition: Secondary | ICD-10-CM | POA: Diagnosis not present

## 2022-12-08 DIAGNOSIS — M5416 Radiculopathy, lumbar region: Secondary | ICD-10-CM | POA: Diagnosis not present

## 2022-12-08 DIAGNOSIS — I484 Atypical atrial flutter: Secondary | ICD-10-CM | POA: Diagnosis not present

## 2022-12-08 DIAGNOSIS — I77810 Thoracic aortic ectasia: Secondary | ICD-10-CM | POA: Diagnosis not present

## 2022-12-08 DIAGNOSIS — Z950 Presence of cardiac pacemaker: Secondary | ICD-10-CM | POA: Diagnosis not present

## 2022-12-08 DIAGNOSIS — I110 Hypertensive heart disease with heart failure: Secondary | ICD-10-CM | POA: Diagnosis not present

## 2022-12-08 DIAGNOSIS — Z8673 Personal history of transient ischemic attack (TIA), and cerebral infarction without residual deficits: Secondary | ICD-10-CM | POA: Diagnosis not present

## 2022-12-08 DIAGNOSIS — F320 Major depressive disorder, single episode, mild: Secondary | ICD-10-CM | POA: Diagnosis not present

## 2022-12-08 DIAGNOSIS — E785 Hyperlipidemia, unspecified: Secondary | ICD-10-CM | POA: Diagnosis not present

## 2022-12-08 DIAGNOSIS — K219 Gastro-esophageal reflux disease without esophagitis: Secondary | ICD-10-CM | POA: Diagnosis not present

## 2022-12-08 DIAGNOSIS — G4733 Obstructive sleep apnea (adult) (pediatric): Secondary | ICD-10-CM | POA: Diagnosis not present

## 2022-12-08 DIAGNOSIS — N401 Enlarged prostate with lower urinary tract symptoms: Secondary | ICD-10-CM | POA: Diagnosis not present

## 2022-12-08 DIAGNOSIS — I2510 Atherosclerotic heart disease of native coronary artery without angina pectoris: Secondary | ICD-10-CM | POA: Diagnosis not present

## 2022-12-08 DIAGNOSIS — Z8616 Personal history of COVID-19: Secondary | ICD-10-CM | POA: Diagnosis not present

## 2022-12-08 DIAGNOSIS — Z9849 Cataract extraction status, unspecified eye: Secondary | ICD-10-CM | POA: Diagnosis not present

## 2022-12-08 DIAGNOSIS — I080 Rheumatic disorders of both mitral and aortic valves: Secondary | ICD-10-CM | POA: Diagnosis not present

## 2022-12-08 DIAGNOSIS — E1140 Type 2 diabetes mellitus with diabetic neuropathy, unspecified: Secondary | ICD-10-CM | POA: Diagnosis not present

## 2022-12-11 ENCOUNTER — Encounter: Payer: Self-pay | Admitting: Nurse Practitioner

## 2022-12-11 DIAGNOSIS — Z950 Presence of cardiac pacemaker: Secondary | ICD-10-CM | POA: Diagnosis not present

## 2022-12-11 DIAGNOSIS — I495 Sick sinus syndrome: Secondary | ICD-10-CM | POA: Diagnosis not present

## 2022-12-11 DIAGNOSIS — I2510 Atherosclerotic heart disease of native coronary artery without angina pectoris: Secondary | ICD-10-CM | POA: Diagnosis not present

## 2022-12-11 DIAGNOSIS — D6859 Other primary thrombophilia: Secondary | ICD-10-CM | POA: Diagnosis not present

## 2022-12-11 DIAGNOSIS — Z9849 Cataract extraction status, unspecified eye: Secondary | ICD-10-CM | POA: Diagnosis not present

## 2022-12-11 DIAGNOSIS — K219 Gastro-esophageal reflux disease without esophagitis: Secondary | ICD-10-CM | POA: Diagnosis not present

## 2022-12-11 DIAGNOSIS — I77810 Thoracic aortic ectasia: Secondary | ICD-10-CM | POA: Diagnosis not present

## 2022-12-11 DIAGNOSIS — E1140 Type 2 diabetes mellitus with diabetic neuropathy, unspecified: Secondary | ICD-10-CM | POA: Diagnosis not present

## 2022-12-11 DIAGNOSIS — I252 Old myocardial infarction: Secondary | ICD-10-CM | POA: Diagnosis not present

## 2022-12-11 DIAGNOSIS — E559 Vitamin D deficiency, unspecified: Secondary | ICD-10-CM | POA: Diagnosis not present

## 2022-12-11 DIAGNOSIS — N401 Enlarged prostate with lower urinary tract symptoms: Secondary | ICD-10-CM | POA: Diagnosis not present

## 2022-12-11 DIAGNOSIS — Z8673 Personal history of transient ischemic attack (TIA), and cerebral infarction without residual deficits: Secondary | ICD-10-CM | POA: Diagnosis not present

## 2022-12-11 DIAGNOSIS — G4733 Obstructive sleep apnea (adult) (pediatric): Secondary | ICD-10-CM | POA: Diagnosis not present

## 2022-12-11 DIAGNOSIS — Z7901 Long term (current) use of anticoagulants: Secondary | ICD-10-CM | POA: Diagnosis not present

## 2022-12-11 DIAGNOSIS — R350 Frequency of micturition: Secondary | ICD-10-CM | POA: Diagnosis not present

## 2022-12-11 DIAGNOSIS — I110 Hypertensive heart disease with heart failure: Secondary | ICD-10-CM | POA: Diagnosis not present

## 2022-12-11 DIAGNOSIS — I484 Atypical atrial flutter: Secondary | ICD-10-CM | POA: Diagnosis not present

## 2022-12-11 DIAGNOSIS — I5043 Acute on chronic combined systolic (congestive) and diastolic (congestive) heart failure: Secondary | ICD-10-CM | POA: Diagnosis not present

## 2022-12-11 DIAGNOSIS — F320 Major depressive disorder, single episode, mild: Secondary | ICD-10-CM | POA: Diagnosis not present

## 2022-12-11 DIAGNOSIS — M5137 Other intervertebral disc degeneration, lumbosacral region: Secondary | ICD-10-CM | POA: Diagnosis not present

## 2022-12-11 DIAGNOSIS — I480 Paroxysmal atrial fibrillation: Secondary | ICD-10-CM | POA: Diagnosis not present

## 2022-12-11 DIAGNOSIS — M5416 Radiculopathy, lumbar region: Secondary | ICD-10-CM | POA: Diagnosis not present

## 2022-12-11 DIAGNOSIS — I080 Rheumatic disorders of both mitral and aortic valves: Secondary | ICD-10-CM | POA: Diagnosis not present

## 2022-12-11 DIAGNOSIS — Z8616 Personal history of COVID-19: Secondary | ICD-10-CM | POA: Diagnosis not present

## 2022-12-11 DIAGNOSIS — E785 Hyperlipidemia, unspecified: Secondary | ICD-10-CM | POA: Diagnosis not present

## 2022-12-12 ENCOUNTER — Other Ambulatory Visit: Payer: Self-pay

## 2022-12-12 DIAGNOSIS — E559 Vitamin D deficiency, unspecified: Secondary | ICD-10-CM | POA: Diagnosis not present

## 2022-12-12 DIAGNOSIS — E1140 Type 2 diabetes mellitus with diabetic neuropathy, unspecified: Secondary | ICD-10-CM | POA: Diagnosis not present

## 2022-12-12 DIAGNOSIS — Z7901 Long term (current) use of anticoagulants: Secondary | ICD-10-CM | POA: Diagnosis not present

## 2022-12-12 DIAGNOSIS — Z950 Presence of cardiac pacemaker: Secondary | ICD-10-CM | POA: Diagnosis not present

## 2022-12-12 DIAGNOSIS — I495 Sick sinus syndrome: Secondary | ICD-10-CM | POA: Diagnosis not present

## 2022-12-12 DIAGNOSIS — Z8616 Personal history of COVID-19: Secondary | ICD-10-CM | POA: Diagnosis not present

## 2022-12-12 DIAGNOSIS — F320 Major depressive disorder, single episode, mild: Secondary | ICD-10-CM | POA: Diagnosis not present

## 2022-12-12 DIAGNOSIS — M5137 Other intervertebral disc degeneration, lumbosacral region: Secondary | ICD-10-CM | POA: Diagnosis not present

## 2022-12-12 DIAGNOSIS — G4733 Obstructive sleep apnea (adult) (pediatric): Secondary | ICD-10-CM | POA: Diagnosis not present

## 2022-12-12 DIAGNOSIS — E785 Hyperlipidemia, unspecified: Secondary | ICD-10-CM | POA: Diagnosis not present

## 2022-12-12 DIAGNOSIS — I080 Rheumatic disorders of both mitral and aortic valves: Secondary | ICD-10-CM | POA: Diagnosis not present

## 2022-12-12 DIAGNOSIS — Z9849 Cataract extraction status, unspecified eye: Secondary | ICD-10-CM | POA: Diagnosis not present

## 2022-12-12 DIAGNOSIS — K219 Gastro-esophageal reflux disease without esophagitis: Secondary | ICD-10-CM | POA: Diagnosis not present

## 2022-12-12 DIAGNOSIS — D6859 Other primary thrombophilia: Secondary | ICD-10-CM | POA: Diagnosis not present

## 2022-12-12 DIAGNOSIS — I480 Paroxysmal atrial fibrillation: Secondary | ICD-10-CM | POA: Diagnosis not present

## 2022-12-12 DIAGNOSIS — M5416 Radiculopathy, lumbar region: Secondary | ICD-10-CM | POA: Diagnosis not present

## 2022-12-12 DIAGNOSIS — R350 Frequency of micturition: Secondary | ICD-10-CM | POA: Diagnosis not present

## 2022-12-12 DIAGNOSIS — I252 Old myocardial infarction: Secondary | ICD-10-CM | POA: Diagnosis not present

## 2022-12-12 DIAGNOSIS — I2510 Atherosclerotic heart disease of native coronary artery without angina pectoris: Secondary | ICD-10-CM | POA: Diagnosis not present

## 2022-12-12 DIAGNOSIS — N401 Enlarged prostate with lower urinary tract symptoms: Secondary | ICD-10-CM | POA: Diagnosis not present

## 2022-12-12 DIAGNOSIS — I77810 Thoracic aortic ectasia: Secondary | ICD-10-CM | POA: Diagnosis not present

## 2022-12-12 DIAGNOSIS — I484 Atypical atrial flutter: Secondary | ICD-10-CM | POA: Diagnosis not present

## 2022-12-12 DIAGNOSIS — I110 Hypertensive heart disease with heart failure: Secondary | ICD-10-CM | POA: Diagnosis not present

## 2022-12-12 DIAGNOSIS — Z8673 Personal history of transient ischemic attack (TIA), and cerebral infarction without residual deficits: Secondary | ICD-10-CM | POA: Diagnosis not present

## 2022-12-12 DIAGNOSIS — I5043 Acute on chronic combined systolic (congestive) and diastolic (congestive) heart failure: Secondary | ICD-10-CM | POA: Diagnosis not present

## 2022-12-12 MED ORDER — RIVAROXABAN 20 MG PO TABS
ORAL_TABLET | ORAL | 0 refills | Status: DC
Start: 2022-12-12 — End: 2023-03-07

## 2022-12-13 DIAGNOSIS — I252 Old myocardial infarction: Secondary | ICD-10-CM | POA: Diagnosis not present

## 2022-12-13 DIAGNOSIS — N401 Enlarged prostate with lower urinary tract symptoms: Secondary | ICD-10-CM | POA: Diagnosis not present

## 2022-12-13 DIAGNOSIS — I495 Sick sinus syndrome: Secondary | ICD-10-CM | POA: Diagnosis not present

## 2022-12-13 DIAGNOSIS — Z7901 Long term (current) use of anticoagulants: Secondary | ICD-10-CM | POA: Diagnosis not present

## 2022-12-13 DIAGNOSIS — Z950 Presence of cardiac pacemaker: Secondary | ICD-10-CM | POA: Diagnosis not present

## 2022-12-13 DIAGNOSIS — F320 Major depressive disorder, single episode, mild: Secondary | ICD-10-CM | POA: Diagnosis not present

## 2022-12-13 DIAGNOSIS — D6859 Other primary thrombophilia: Secondary | ICD-10-CM | POA: Diagnosis not present

## 2022-12-13 DIAGNOSIS — I080 Rheumatic disorders of both mitral and aortic valves: Secondary | ICD-10-CM | POA: Diagnosis not present

## 2022-12-13 DIAGNOSIS — K219 Gastro-esophageal reflux disease without esophagitis: Secondary | ICD-10-CM | POA: Diagnosis not present

## 2022-12-13 DIAGNOSIS — Z8616 Personal history of COVID-19: Secondary | ICD-10-CM | POA: Diagnosis not present

## 2022-12-13 DIAGNOSIS — G4733 Obstructive sleep apnea (adult) (pediatric): Secondary | ICD-10-CM | POA: Diagnosis not present

## 2022-12-13 DIAGNOSIS — Z9849 Cataract extraction status, unspecified eye: Secondary | ICD-10-CM | POA: Diagnosis not present

## 2022-12-13 DIAGNOSIS — E559 Vitamin D deficiency, unspecified: Secondary | ICD-10-CM | POA: Diagnosis not present

## 2022-12-13 DIAGNOSIS — M5137 Other intervertebral disc degeneration, lumbosacral region: Secondary | ICD-10-CM | POA: Diagnosis not present

## 2022-12-13 DIAGNOSIS — M5416 Radiculopathy, lumbar region: Secondary | ICD-10-CM | POA: Diagnosis not present

## 2022-12-13 DIAGNOSIS — Z8673 Personal history of transient ischemic attack (TIA), and cerebral infarction without residual deficits: Secondary | ICD-10-CM | POA: Diagnosis not present

## 2022-12-13 DIAGNOSIS — E1140 Type 2 diabetes mellitus with diabetic neuropathy, unspecified: Secondary | ICD-10-CM | POA: Diagnosis not present

## 2022-12-13 DIAGNOSIS — I5043 Acute on chronic combined systolic (congestive) and diastolic (congestive) heart failure: Secondary | ICD-10-CM | POA: Diagnosis not present

## 2022-12-13 DIAGNOSIS — I480 Paroxysmal atrial fibrillation: Secondary | ICD-10-CM | POA: Diagnosis not present

## 2022-12-13 DIAGNOSIS — E785 Hyperlipidemia, unspecified: Secondary | ICD-10-CM | POA: Diagnosis not present

## 2022-12-13 DIAGNOSIS — I110 Hypertensive heart disease with heart failure: Secondary | ICD-10-CM | POA: Diagnosis not present

## 2022-12-13 DIAGNOSIS — I2510 Atherosclerotic heart disease of native coronary artery without angina pectoris: Secondary | ICD-10-CM | POA: Diagnosis not present

## 2022-12-13 DIAGNOSIS — R350 Frequency of micturition: Secondary | ICD-10-CM | POA: Diagnosis not present

## 2022-12-13 DIAGNOSIS — I484 Atypical atrial flutter: Secondary | ICD-10-CM | POA: Diagnosis not present

## 2022-12-13 DIAGNOSIS — I77810 Thoracic aortic ectasia: Secondary | ICD-10-CM | POA: Diagnosis not present

## 2022-12-18 DIAGNOSIS — Z8616 Personal history of COVID-19: Secondary | ICD-10-CM | POA: Diagnosis not present

## 2022-12-18 DIAGNOSIS — Z950 Presence of cardiac pacemaker: Secondary | ICD-10-CM | POA: Diagnosis not present

## 2022-12-18 DIAGNOSIS — I252 Old myocardial infarction: Secondary | ICD-10-CM | POA: Diagnosis not present

## 2022-12-18 DIAGNOSIS — R350 Frequency of micturition: Secondary | ICD-10-CM | POA: Diagnosis not present

## 2022-12-18 DIAGNOSIS — I110 Hypertensive heart disease with heart failure: Secondary | ICD-10-CM | POA: Diagnosis not present

## 2022-12-18 DIAGNOSIS — E1140 Type 2 diabetes mellitus with diabetic neuropathy, unspecified: Secondary | ICD-10-CM | POA: Diagnosis not present

## 2022-12-18 DIAGNOSIS — Z7901 Long term (current) use of anticoagulants: Secondary | ICD-10-CM | POA: Diagnosis not present

## 2022-12-18 DIAGNOSIS — D6859 Other primary thrombophilia: Secondary | ICD-10-CM | POA: Diagnosis not present

## 2022-12-18 DIAGNOSIS — I2510 Atherosclerotic heart disease of native coronary artery without angina pectoris: Secondary | ICD-10-CM | POA: Diagnosis not present

## 2022-12-18 DIAGNOSIS — I484 Atypical atrial flutter: Secondary | ICD-10-CM | POA: Diagnosis not present

## 2022-12-18 DIAGNOSIS — I5043 Acute on chronic combined systolic (congestive) and diastolic (congestive) heart failure: Secondary | ICD-10-CM | POA: Diagnosis not present

## 2022-12-18 DIAGNOSIS — I495 Sick sinus syndrome: Secondary | ICD-10-CM | POA: Diagnosis not present

## 2022-12-18 DIAGNOSIS — I480 Paroxysmal atrial fibrillation: Secondary | ICD-10-CM | POA: Diagnosis not present

## 2022-12-18 DIAGNOSIS — Z8673 Personal history of transient ischemic attack (TIA), and cerebral infarction without residual deficits: Secondary | ICD-10-CM | POA: Diagnosis not present

## 2022-12-18 DIAGNOSIS — E559 Vitamin D deficiency, unspecified: Secondary | ICD-10-CM | POA: Diagnosis not present

## 2022-12-18 DIAGNOSIS — K219 Gastro-esophageal reflux disease without esophagitis: Secondary | ICD-10-CM | POA: Diagnosis not present

## 2022-12-18 DIAGNOSIS — G4733 Obstructive sleep apnea (adult) (pediatric): Secondary | ICD-10-CM | POA: Diagnosis not present

## 2022-12-18 DIAGNOSIS — Z9849 Cataract extraction status, unspecified eye: Secondary | ICD-10-CM | POA: Diagnosis not present

## 2022-12-18 DIAGNOSIS — I77810 Thoracic aortic ectasia: Secondary | ICD-10-CM | POA: Diagnosis not present

## 2022-12-18 DIAGNOSIS — F320 Major depressive disorder, single episode, mild: Secondary | ICD-10-CM | POA: Diagnosis not present

## 2022-12-18 DIAGNOSIS — M5137 Other intervertebral disc degeneration, lumbosacral region: Secondary | ICD-10-CM | POA: Diagnosis not present

## 2022-12-18 DIAGNOSIS — M5416 Radiculopathy, lumbar region: Secondary | ICD-10-CM | POA: Diagnosis not present

## 2022-12-18 DIAGNOSIS — E785 Hyperlipidemia, unspecified: Secondary | ICD-10-CM | POA: Diagnosis not present

## 2022-12-18 DIAGNOSIS — I080 Rheumatic disorders of both mitral and aortic valves: Secondary | ICD-10-CM | POA: Diagnosis not present

## 2022-12-18 DIAGNOSIS — N401 Enlarged prostate with lower urinary tract symptoms: Secondary | ICD-10-CM | POA: Diagnosis not present

## 2022-12-20 DIAGNOSIS — G4733 Obstructive sleep apnea (adult) (pediatric): Secondary | ICD-10-CM | POA: Diagnosis not present

## 2022-12-20 DIAGNOSIS — I110 Hypertensive heart disease with heart failure: Secondary | ICD-10-CM | POA: Diagnosis not present

## 2022-12-20 DIAGNOSIS — Z8673 Personal history of transient ischemic attack (TIA), and cerebral infarction without residual deficits: Secondary | ICD-10-CM | POA: Diagnosis not present

## 2022-12-20 DIAGNOSIS — K219 Gastro-esophageal reflux disease without esophagitis: Secondary | ICD-10-CM | POA: Diagnosis not present

## 2022-12-20 DIAGNOSIS — I080 Rheumatic disorders of both mitral and aortic valves: Secondary | ICD-10-CM | POA: Diagnosis not present

## 2022-12-20 DIAGNOSIS — E559 Vitamin D deficiency, unspecified: Secondary | ICD-10-CM | POA: Diagnosis not present

## 2022-12-20 DIAGNOSIS — I2510 Atherosclerotic heart disease of native coronary artery without angina pectoris: Secondary | ICD-10-CM | POA: Diagnosis not present

## 2022-12-20 DIAGNOSIS — I252 Old myocardial infarction: Secondary | ICD-10-CM | POA: Diagnosis not present

## 2022-12-20 DIAGNOSIS — D6859 Other primary thrombophilia: Secondary | ICD-10-CM | POA: Diagnosis not present

## 2022-12-20 DIAGNOSIS — I77810 Thoracic aortic ectasia: Secondary | ICD-10-CM | POA: Diagnosis not present

## 2022-12-20 DIAGNOSIS — M5416 Radiculopathy, lumbar region: Secondary | ICD-10-CM | POA: Diagnosis not present

## 2022-12-20 DIAGNOSIS — F320 Major depressive disorder, single episode, mild: Secondary | ICD-10-CM | POA: Diagnosis not present

## 2022-12-20 DIAGNOSIS — I5043 Acute on chronic combined systolic (congestive) and diastolic (congestive) heart failure: Secondary | ICD-10-CM | POA: Diagnosis not present

## 2022-12-20 DIAGNOSIS — I495 Sick sinus syndrome: Secondary | ICD-10-CM | POA: Diagnosis not present

## 2022-12-20 DIAGNOSIS — M5137 Other intervertebral disc degeneration, lumbosacral region: Secondary | ICD-10-CM | POA: Diagnosis not present

## 2022-12-20 DIAGNOSIS — N401 Enlarged prostate with lower urinary tract symptoms: Secondary | ICD-10-CM | POA: Diagnosis not present

## 2022-12-20 DIAGNOSIS — Z950 Presence of cardiac pacemaker: Secondary | ICD-10-CM | POA: Diagnosis not present

## 2022-12-20 DIAGNOSIS — I484 Atypical atrial flutter: Secondary | ICD-10-CM | POA: Diagnosis not present

## 2022-12-20 DIAGNOSIS — Z7901 Long term (current) use of anticoagulants: Secondary | ICD-10-CM | POA: Diagnosis not present

## 2022-12-20 DIAGNOSIS — E1140 Type 2 diabetes mellitus with diabetic neuropathy, unspecified: Secondary | ICD-10-CM | POA: Diagnosis not present

## 2022-12-20 DIAGNOSIS — E785 Hyperlipidemia, unspecified: Secondary | ICD-10-CM | POA: Diagnosis not present

## 2022-12-20 DIAGNOSIS — R350 Frequency of micturition: Secondary | ICD-10-CM | POA: Diagnosis not present

## 2022-12-20 DIAGNOSIS — Z9849 Cataract extraction status, unspecified eye: Secondary | ICD-10-CM | POA: Diagnosis not present

## 2022-12-20 DIAGNOSIS — Z8616 Personal history of COVID-19: Secondary | ICD-10-CM | POA: Diagnosis not present

## 2022-12-20 DIAGNOSIS — I480 Paroxysmal atrial fibrillation: Secondary | ICD-10-CM | POA: Diagnosis not present

## 2022-12-27 DIAGNOSIS — D6859 Other primary thrombophilia: Secondary | ICD-10-CM | POA: Diagnosis not present

## 2022-12-27 DIAGNOSIS — I252 Old myocardial infarction: Secondary | ICD-10-CM | POA: Diagnosis not present

## 2022-12-27 DIAGNOSIS — I480 Paroxysmal atrial fibrillation: Secondary | ICD-10-CM | POA: Diagnosis not present

## 2022-12-27 DIAGNOSIS — Z7901 Long term (current) use of anticoagulants: Secondary | ICD-10-CM | POA: Diagnosis not present

## 2022-12-27 DIAGNOSIS — I2510 Atherosclerotic heart disease of native coronary artery without angina pectoris: Secondary | ICD-10-CM | POA: Diagnosis not present

## 2022-12-27 DIAGNOSIS — E1140 Type 2 diabetes mellitus with diabetic neuropathy, unspecified: Secondary | ICD-10-CM | POA: Diagnosis not present

## 2022-12-27 DIAGNOSIS — E785 Hyperlipidemia, unspecified: Secondary | ICD-10-CM | POA: Diagnosis not present

## 2022-12-27 DIAGNOSIS — F320 Major depressive disorder, single episode, mild: Secondary | ICD-10-CM | POA: Diagnosis not present

## 2022-12-27 DIAGNOSIS — I110 Hypertensive heart disease with heart failure: Secondary | ICD-10-CM | POA: Diagnosis not present

## 2022-12-27 DIAGNOSIS — K219 Gastro-esophageal reflux disease without esophagitis: Secondary | ICD-10-CM | POA: Diagnosis not present

## 2022-12-27 DIAGNOSIS — I77810 Thoracic aortic ectasia: Secondary | ICD-10-CM | POA: Diagnosis not present

## 2022-12-27 DIAGNOSIS — I495 Sick sinus syndrome: Secondary | ICD-10-CM | POA: Diagnosis not present

## 2022-12-27 DIAGNOSIS — M5416 Radiculopathy, lumbar region: Secondary | ICD-10-CM | POA: Diagnosis not present

## 2022-12-27 DIAGNOSIS — E559 Vitamin D deficiency, unspecified: Secondary | ICD-10-CM | POA: Diagnosis not present

## 2022-12-27 DIAGNOSIS — Z950 Presence of cardiac pacemaker: Secondary | ICD-10-CM | POA: Diagnosis not present

## 2022-12-27 DIAGNOSIS — Z8616 Personal history of COVID-19: Secondary | ICD-10-CM | POA: Diagnosis not present

## 2022-12-27 DIAGNOSIS — N401 Enlarged prostate with lower urinary tract symptoms: Secondary | ICD-10-CM | POA: Diagnosis not present

## 2022-12-27 DIAGNOSIS — I484 Atypical atrial flutter: Secondary | ICD-10-CM | POA: Diagnosis not present

## 2022-12-27 DIAGNOSIS — Z9849 Cataract extraction status, unspecified eye: Secondary | ICD-10-CM | POA: Diagnosis not present

## 2022-12-27 DIAGNOSIS — R350 Frequency of micturition: Secondary | ICD-10-CM | POA: Diagnosis not present

## 2022-12-27 DIAGNOSIS — I080 Rheumatic disorders of both mitral and aortic valves: Secondary | ICD-10-CM | POA: Diagnosis not present

## 2022-12-27 DIAGNOSIS — M5137 Other intervertebral disc degeneration, lumbosacral region: Secondary | ICD-10-CM | POA: Diagnosis not present

## 2022-12-27 DIAGNOSIS — G4733 Obstructive sleep apnea (adult) (pediatric): Secondary | ICD-10-CM | POA: Diagnosis not present

## 2022-12-27 DIAGNOSIS — I5043 Acute on chronic combined systolic (congestive) and diastolic (congestive) heart failure: Secondary | ICD-10-CM | POA: Diagnosis not present

## 2022-12-27 DIAGNOSIS — Z8673 Personal history of transient ischemic attack (TIA), and cerebral infarction without residual deficits: Secondary | ICD-10-CM | POA: Diagnosis not present

## 2022-12-28 DIAGNOSIS — I480 Paroxysmal atrial fibrillation: Secondary | ICD-10-CM | POA: Diagnosis not present

## 2022-12-28 DIAGNOSIS — Z9849 Cataract extraction status, unspecified eye: Secondary | ICD-10-CM | POA: Diagnosis not present

## 2022-12-28 DIAGNOSIS — E1140 Type 2 diabetes mellitus with diabetic neuropathy, unspecified: Secondary | ICD-10-CM | POA: Diagnosis not present

## 2022-12-28 DIAGNOSIS — Z8673 Personal history of transient ischemic attack (TIA), and cerebral infarction without residual deficits: Secondary | ICD-10-CM | POA: Diagnosis not present

## 2022-12-28 DIAGNOSIS — D6859 Other primary thrombophilia: Secondary | ICD-10-CM | POA: Diagnosis not present

## 2022-12-28 DIAGNOSIS — N401 Enlarged prostate with lower urinary tract symptoms: Secondary | ICD-10-CM | POA: Diagnosis not present

## 2022-12-28 DIAGNOSIS — F320 Major depressive disorder, single episode, mild: Secondary | ICD-10-CM | POA: Diagnosis not present

## 2022-12-28 DIAGNOSIS — I2510 Atherosclerotic heart disease of native coronary artery without angina pectoris: Secondary | ICD-10-CM | POA: Diagnosis not present

## 2022-12-28 DIAGNOSIS — E785 Hyperlipidemia, unspecified: Secondary | ICD-10-CM | POA: Diagnosis not present

## 2022-12-28 DIAGNOSIS — I252 Old myocardial infarction: Secondary | ICD-10-CM | POA: Diagnosis not present

## 2022-12-28 DIAGNOSIS — I5043 Acute on chronic combined systolic (congestive) and diastolic (congestive) heart failure: Secondary | ICD-10-CM | POA: Diagnosis not present

## 2022-12-28 DIAGNOSIS — M5137 Other intervertebral disc degeneration, lumbosacral region: Secondary | ICD-10-CM | POA: Diagnosis not present

## 2022-12-28 DIAGNOSIS — Z950 Presence of cardiac pacemaker: Secondary | ICD-10-CM | POA: Diagnosis not present

## 2022-12-28 DIAGNOSIS — Z8616 Personal history of COVID-19: Secondary | ICD-10-CM | POA: Diagnosis not present

## 2022-12-28 DIAGNOSIS — K219 Gastro-esophageal reflux disease without esophagitis: Secondary | ICD-10-CM | POA: Diagnosis not present

## 2022-12-28 DIAGNOSIS — I495 Sick sinus syndrome: Secondary | ICD-10-CM | POA: Diagnosis not present

## 2022-12-28 DIAGNOSIS — E559 Vitamin D deficiency, unspecified: Secondary | ICD-10-CM | POA: Diagnosis not present

## 2022-12-28 DIAGNOSIS — I484 Atypical atrial flutter: Secondary | ICD-10-CM | POA: Diagnosis not present

## 2022-12-28 DIAGNOSIS — I110 Hypertensive heart disease with heart failure: Secondary | ICD-10-CM | POA: Diagnosis not present

## 2022-12-28 DIAGNOSIS — I77810 Thoracic aortic ectasia: Secondary | ICD-10-CM | POA: Diagnosis not present

## 2022-12-28 DIAGNOSIS — G4733 Obstructive sleep apnea (adult) (pediatric): Secondary | ICD-10-CM | POA: Diagnosis not present

## 2022-12-28 DIAGNOSIS — I080 Rheumatic disorders of both mitral and aortic valves: Secondary | ICD-10-CM | POA: Diagnosis not present

## 2022-12-28 DIAGNOSIS — M5416 Radiculopathy, lumbar region: Secondary | ICD-10-CM | POA: Diagnosis not present

## 2022-12-28 DIAGNOSIS — Z7901 Long term (current) use of anticoagulants: Secondary | ICD-10-CM | POA: Diagnosis not present

## 2022-12-28 DIAGNOSIS — R350 Frequency of micturition: Secondary | ICD-10-CM | POA: Diagnosis not present

## 2023-01-01 DIAGNOSIS — M5416 Radiculopathy, lumbar region: Secondary | ICD-10-CM | POA: Diagnosis not present

## 2023-01-01 DIAGNOSIS — E1140 Type 2 diabetes mellitus with diabetic neuropathy, unspecified: Secondary | ICD-10-CM | POA: Diagnosis not present

## 2023-01-01 DIAGNOSIS — Z8673 Personal history of transient ischemic attack (TIA), and cerebral infarction without residual deficits: Secondary | ICD-10-CM | POA: Diagnosis not present

## 2023-01-01 DIAGNOSIS — D6859 Other primary thrombophilia: Secondary | ICD-10-CM | POA: Diagnosis not present

## 2023-01-01 DIAGNOSIS — Z9849 Cataract extraction status, unspecified eye: Secondary | ICD-10-CM | POA: Diagnosis not present

## 2023-01-01 DIAGNOSIS — G4733 Obstructive sleep apnea (adult) (pediatric): Secondary | ICD-10-CM | POA: Diagnosis not present

## 2023-01-01 DIAGNOSIS — I480 Paroxysmal atrial fibrillation: Secondary | ICD-10-CM | POA: Diagnosis not present

## 2023-01-01 DIAGNOSIS — I5043 Acute on chronic combined systolic (congestive) and diastolic (congestive) heart failure: Secondary | ICD-10-CM | POA: Diagnosis not present

## 2023-01-01 DIAGNOSIS — I080 Rheumatic disorders of both mitral and aortic valves: Secondary | ICD-10-CM | POA: Diagnosis not present

## 2023-01-01 DIAGNOSIS — N401 Enlarged prostate with lower urinary tract symptoms: Secondary | ICD-10-CM | POA: Diagnosis not present

## 2023-01-01 DIAGNOSIS — Z7901 Long term (current) use of anticoagulants: Secondary | ICD-10-CM | POA: Diagnosis not present

## 2023-01-01 DIAGNOSIS — I2510 Atherosclerotic heart disease of native coronary artery without angina pectoris: Secondary | ICD-10-CM | POA: Diagnosis not present

## 2023-01-01 DIAGNOSIS — Z8616 Personal history of COVID-19: Secondary | ICD-10-CM | POA: Diagnosis not present

## 2023-01-01 DIAGNOSIS — I252 Old myocardial infarction: Secondary | ICD-10-CM | POA: Diagnosis not present

## 2023-01-01 DIAGNOSIS — M5137 Other intervertebral disc degeneration, lumbosacral region: Secondary | ICD-10-CM | POA: Diagnosis not present

## 2023-01-01 DIAGNOSIS — Z950 Presence of cardiac pacemaker: Secondary | ICD-10-CM | POA: Diagnosis not present

## 2023-01-01 DIAGNOSIS — F320 Major depressive disorder, single episode, mild: Secondary | ICD-10-CM | POA: Diagnosis not present

## 2023-01-01 DIAGNOSIS — I77810 Thoracic aortic ectasia: Secondary | ICD-10-CM | POA: Diagnosis not present

## 2023-01-01 DIAGNOSIS — E559 Vitamin D deficiency, unspecified: Secondary | ICD-10-CM | POA: Diagnosis not present

## 2023-01-01 DIAGNOSIS — I495 Sick sinus syndrome: Secondary | ICD-10-CM | POA: Diagnosis not present

## 2023-01-01 DIAGNOSIS — I484 Atypical atrial flutter: Secondary | ICD-10-CM | POA: Diagnosis not present

## 2023-01-01 DIAGNOSIS — E785 Hyperlipidemia, unspecified: Secondary | ICD-10-CM | POA: Diagnosis not present

## 2023-01-01 DIAGNOSIS — R350 Frequency of micturition: Secondary | ICD-10-CM | POA: Diagnosis not present

## 2023-01-01 DIAGNOSIS — I110 Hypertensive heart disease with heart failure: Secondary | ICD-10-CM | POA: Diagnosis not present

## 2023-01-01 DIAGNOSIS — K219 Gastro-esophageal reflux disease without esophagitis: Secondary | ICD-10-CM | POA: Diagnosis not present

## 2023-01-02 DIAGNOSIS — E1140 Type 2 diabetes mellitus with diabetic neuropathy, unspecified: Secondary | ICD-10-CM | POA: Diagnosis not present

## 2023-01-02 DIAGNOSIS — I480 Paroxysmal atrial fibrillation: Secondary | ICD-10-CM | POA: Diagnosis not present

## 2023-01-02 DIAGNOSIS — I5043 Acute on chronic combined systolic (congestive) and diastolic (congestive) heart failure: Secondary | ICD-10-CM | POA: Diagnosis not present

## 2023-01-02 DIAGNOSIS — I252 Old myocardial infarction: Secondary | ICD-10-CM | POA: Diagnosis not present

## 2023-01-02 DIAGNOSIS — I484 Atypical atrial flutter: Secondary | ICD-10-CM | POA: Diagnosis not present

## 2023-01-02 DIAGNOSIS — I080 Rheumatic disorders of both mitral and aortic valves: Secondary | ICD-10-CM | POA: Diagnosis not present

## 2023-01-02 DIAGNOSIS — M5137 Other intervertebral disc degeneration, lumbosacral region: Secondary | ICD-10-CM | POA: Diagnosis not present

## 2023-01-02 DIAGNOSIS — K219 Gastro-esophageal reflux disease without esophagitis: Secondary | ICD-10-CM | POA: Diagnosis not present

## 2023-01-02 DIAGNOSIS — I2510 Atherosclerotic heart disease of native coronary artery without angina pectoris: Secondary | ICD-10-CM | POA: Diagnosis not present

## 2023-01-02 DIAGNOSIS — I110 Hypertensive heart disease with heart failure: Secondary | ICD-10-CM | POA: Diagnosis not present

## 2023-01-03 DIAGNOSIS — E1140 Type 2 diabetes mellitus with diabetic neuropathy, unspecified: Secondary | ICD-10-CM | POA: Diagnosis not present

## 2023-01-03 DIAGNOSIS — I484 Atypical atrial flutter: Secondary | ICD-10-CM | POA: Diagnosis not present

## 2023-01-03 DIAGNOSIS — I080 Rheumatic disorders of both mitral and aortic valves: Secondary | ICD-10-CM | POA: Diagnosis not present

## 2023-01-03 DIAGNOSIS — I252 Old myocardial infarction: Secondary | ICD-10-CM | POA: Diagnosis not present

## 2023-01-03 DIAGNOSIS — I2510 Atherosclerotic heart disease of native coronary artery without angina pectoris: Secondary | ICD-10-CM | POA: Diagnosis not present

## 2023-01-03 DIAGNOSIS — M5137 Other intervertebral disc degeneration, lumbosacral region: Secondary | ICD-10-CM | POA: Diagnosis not present

## 2023-01-03 DIAGNOSIS — K219 Gastro-esophageal reflux disease without esophagitis: Secondary | ICD-10-CM | POA: Diagnosis not present

## 2023-01-03 DIAGNOSIS — I110 Hypertensive heart disease with heart failure: Secondary | ICD-10-CM | POA: Diagnosis not present

## 2023-01-03 DIAGNOSIS — I480 Paroxysmal atrial fibrillation: Secondary | ICD-10-CM | POA: Diagnosis not present

## 2023-01-03 DIAGNOSIS — I5043 Acute on chronic combined systolic (congestive) and diastolic (congestive) heart failure: Secondary | ICD-10-CM | POA: Diagnosis not present

## 2023-01-08 DIAGNOSIS — E1140 Type 2 diabetes mellitus with diabetic neuropathy, unspecified: Secondary | ICD-10-CM | POA: Diagnosis not present

## 2023-01-08 DIAGNOSIS — I480 Paroxysmal atrial fibrillation: Secondary | ICD-10-CM | POA: Diagnosis not present

## 2023-01-08 DIAGNOSIS — K219 Gastro-esophageal reflux disease without esophagitis: Secondary | ICD-10-CM | POA: Diagnosis not present

## 2023-01-08 DIAGNOSIS — I484 Atypical atrial flutter: Secondary | ICD-10-CM | POA: Diagnosis not present

## 2023-01-08 DIAGNOSIS — I252 Old myocardial infarction: Secondary | ICD-10-CM | POA: Diagnosis not present

## 2023-01-08 DIAGNOSIS — M5137 Other intervertebral disc degeneration, lumbosacral region: Secondary | ICD-10-CM | POA: Diagnosis not present

## 2023-01-08 DIAGNOSIS — I2510 Atherosclerotic heart disease of native coronary artery without angina pectoris: Secondary | ICD-10-CM | POA: Diagnosis not present

## 2023-01-08 DIAGNOSIS — I5043 Acute on chronic combined systolic (congestive) and diastolic (congestive) heart failure: Secondary | ICD-10-CM | POA: Diagnosis not present

## 2023-01-08 DIAGNOSIS — I110 Hypertensive heart disease with heart failure: Secondary | ICD-10-CM | POA: Diagnosis not present

## 2023-01-08 DIAGNOSIS — I080 Rheumatic disorders of both mitral and aortic valves: Secondary | ICD-10-CM | POA: Diagnosis not present

## 2023-01-10 DIAGNOSIS — M5137 Other intervertebral disc degeneration, lumbosacral region: Secondary | ICD-10-CM | POA: Diagnosis not present

## 2023-01-10 DIAGNOSIS — I252 Old myocardial infarction: Secondary | ICD-10-CM | POA: Diagnosis not present

## 2023-01-10 DIAGNOSIS — E1140 Type 2 diabetes mellitus with diabetic neuropathy, unspecified: Secondary | ICD-10-CM | POA: Diagnosis not present

## 2023-01-10 DIAGNOSIS — I5043 Acute on chronic combined systolic (congestive) and diastolic (congestive) heart failure: Secondary | ICD-10-CM | POA: Diagnosis not present

## 2023-01-10 DIAGNOSIS — I110 Hypertensive heart disease with heart failure: Secondary | ICD-10-CM | POA: Diagnosis not present

## 2023-01-10 DIAGNOSIS — K219 Gastro-esophageal reflux disease without esophagitis: Secondary | ICD-10-CM | POA: Diagnosis not present

## 2023-01-10 DIAGNOSIS — I480 Paroxysmal atrial fibrillation: Secondary | ICD-10-CM | POA: Diagnosis not present

## 2023-01-10 DIAGNOSIS — I080 Rheumatic disorders of both mitral and aortic valves: Secondary | ICD-10-CM | POA: Diagnosis not present

## 2023-01-10 DIAGNOSIS — I484 Atypical atrial flutter: Secondary | ICD-10-CM | POA: Diagnosis not present

## 2023-01-10 DIAGNOSIS — I2510 Atherosclerotic heart disease of native coronary artery without angina pectoris: Secondary | ICD-10-CM | POA: Diagnosis not present

## 2023-01-15 DIAGNOSIS — I484 Atypical atrial flutter: Secondary | ICD-10-CM | POA: Diagnosis not present

## 2023-01-15 DIAGNOSIS — I252 Old myocardial infarction: Secondary | ICD-10-CM | POA: Diagnosis not present

## 2023-01-15 DIAGNOSIS — I5043 Acute on chronic combined systolic (congestive) and diastolic (congestive) heart failure: Secondary | ICD-10-CM | POA: Diagnosis not present

## 2023-01-15 DIAGNOSIS — M5137 Other intervertebral disc degeneration, lumbosacral region: Secondary | ICD-10-CM | POA: Diagnosis not present

## 2023-01-15 DIAGNOSIS — I480 Paroxysmal atrial fibrillation: Secondary | ICD-10-CM | POA: Diagnosis not present

## 2023-01-15 DIAGNOSIS — K219 Gastro-esophageal reflux disease without esophagitis: Secondary | ICD-10-CM | POA: Diagnosis not present

## 2023-01-15 DIAGNOSIS — I2510 Atherosclerotic heart disease of native coronary artery without angina pectoris: Secondary | ICD-10-CM | POA: Diagnosis not present

## 2023-01-15 DIAGNOSIS — E1140 Type 2 diabetes mellitus with diabetic neuropathy, unspecified: Secondary | ICD-10-CM | POA: Diagnosis not present

## 2023-01-15 DIAGNOSIS — I080 Rheumatic disorders of both mitral and aortic valves: Secondary | ICD-10-CM | POA: Diagnosis not present

## 2023-01-15 DIAGNOSIS — I110 Hypertensive heart disease with heart failure: Secondary | ICD-10-CM | POA: Diagnosis not present

## 2023-01-16 ENCOUNTER — Ambulatory Visit (INDEPENDENT_AMBULATORY_CARE_PROVIDER_SITE_OTHER): Payer: Medicare HMO

## 2023-01-16 DIAGNOSIS — I495 Sick sinus syndrome: Secondary | ICD-10-CM

## 2023-01-17 LAB — CUP PACEART REMOTE DEVICE CHECK
Battery Remaining Longevity: 122 mo
Battery Voltage: 3 V
Brady Statistic AP VP Percent: 0.24 %
Brady Statistic AP VS Percent: 99.68 %
Brady Statistic AS VP Percent: 0.02 %
Brady Statistic AS VS Percent: 0.07 %
Brady Statistic RA Percent Paced: 99.77 %
Brady Statistic RV Percent Paced: 0.31 %
Date Time Interrogation Session: 20240617194933
Implantable Lead Connection Status: 753985
Implantable Lead Connection Status: 753985
Implantable Lead Implant Date: 20201207
Implantable Lead Implant Date: 20201207
Implantable Lead Location: 753859
Implantable Lead Location: 753860
Implantable Lead Model: 5076
Implantable Lead Model: 5076
Implantable Pulse Generator Implant Date: 20201207
Lead Channel Impedance Value: 323 Ohm
Lead Channel Impedance Value: 361 Ohm
Lead Channel Impedance Value: 456 Ohm
Lead Channel Impedance Value: 475 Ohm
Lead Channel Pacing Threshold Amplitude: 0.625 V
Lead Channel Pacing Threshold Amplitude: 1.25 V
Lead Channel Pacing Threshold Pulse Width: 0.4 ms
Lead Channel Pacing Threshold Pulse Width: 0.4 ms
Lead Channel Sensing Intrinsic Amplitude: 2.75 mV
Lead Channel Sensing Intrinsic Amplitude: 2.75 mV
Lead Channel Sensing Intrinsic Amplitude: 29.875 mV
Lead Channel Sensing Intrinsic Amplitude: 29.875 mV
Lead Channel Setting Pacing Amplitude: 1.5 V
Lead Channel Setting Pacing Amplitude: 2.5 V
Lead Channel Setting Pacing Pulse Width: 0.4 ms
Lead Channel Setting Sensing Sensitivity: 1.2 mV
Zone Setting Status: 755011
Zone Setting Status: 755011

## 2023-01-18 DIAGNOSIS — I080 Rheumatic disorders of both mitral and aortic valves: Secondary | ICD-10-CM | POA: Diagnosis not present

## 2023-01-18 DIAGNOSIS — E1140 Type 2 diabetes mellitus with diabetic neuropathy, unspecified: Secondary | ICD-10-CM | POA: Diagnosis not present

## 2023-01-18 DIAGNOSIS — K219 Gastro-esophageal reflux disease without esophagitis: Secondary | ICD-10-CM | POA: Diagnosis not present

## 2023-01-18 DIAGNOSIS — M5137 Other intervertebral disc degeneration, lumbosacral region: Secondary | ICD-10-CM | POA: Diagnosis not present

## 2023-01-18 DIAGNOSIS — I484 Atypical atrial flutter: Secondary | ICD-10-CM | POA: Diagnosis not present

## 2023-01-18 DIAGNOSIS — I110 Hypertensive heart disease with heart failure: Secondary | ICD-10-CM | POA: Diagnosis not present

## 2023-01-18 DIAGNOSIS — I252 Old myocardial infarction: Secondary | ICD-10-CM | POA: Diagnosis not present

## 2023-01-18 DIAGNOSIS — I480 Paroxysmal atrial fibrillation: Secondary | ICD-10-CM | POA: Diagnosis not present

## 2023-01-18 DIAGNOSIS — I5043 Acute on chronic combined systolic (congestive) and diastolic (congestive) heart failure: Secondary | ICD-10-CM | POA: Diagnosis not present

## 2023-01-18 DIAGNOSIS — I2510 Atherosclerotic heart disease of native coronary artery without angina pectoris: Secondary | ICD-10-CM | POA: Diagnosis not present

## 2023-01-22 DIAGNOSIS — M5137 Other intervertebral disc degeneration, lumbosacral region: Secondary | ICD-10-CM | POA: Diagnosis not present

## 2023-01-22 DIAGNOSIS — E1140 Type 2 diabetes mellitus with diabetic neuropathy, unspecified: Secondary | ICD-10-CM | POA: Diagnosis not present

## 2023-01-22 DIAGNOSIS — K219 Gastro-esophageal reflux disease without esophagitis: Secondary | ICD-10-CM | POA: Diagnosis not present

## 2023-01-22 DIAGNOSIS — I252 Old myocardial infarction: Secondary | ICD-10-CM | POA: Diagnosis not present

## 2023-01-22 DIAGNOSIS — I110 Hypertensive heart disease with heart failure: Secondary | ICD-10-CM | POA: Diagnosis not present

## 2023-01-22 DIAGNOSIS — I2510 Atherosclerotic heart disease of native coronary artery without angina pectoris: Secondary | ICD-10-CM | POA: Diagnosis not present

## 2023-01-22 DIAGNOSIS — I484 Atypical atrial flutter: Secondary | ICD-10-CM | POA: Diagnosis not present

## 2023-01-22 DIAGNOSIS — I480 Paroxysmal atrial fibrillation: Secondary | ICD-10-CM | POA: Diagnosis not present

## 2023-01-22 DIAGNOSIS — I080 Rheumatic disorders of both mitral and aortic valves: Secondary | ICD-10-CM | POA: Diagnosis not present

## 2023-01-22 DIAGNOSIS — I5043 Acute on chronic combined systolic (congestive) and diastolic (congestive) heart failure: Secondary | ICD-10-CM | POA: Diagnosis not present

## 2023-01-29 DIAGNOSIS — I080 Rheumatic disorders of both mitral and aortic valves: Secondary | ICD-10-CM | POA: Diagnosis not present

## 2023-01-29 DIAGNOSIS — I5043 Acute on chronic combined systolic (congestive) and diastolic (congestive) heart failure: Secondary | ICD-10-CM | POA: Diagnosis not present

## 2023-01-29 DIAGNOSIS — I484 Atypical atrial flutter: Secondary | ICD-10-CM | POA: Diagnosis not present

## 2023-01-29 DIAGNOSIS — I480 Paroxysmal atrial fibrillation: Secondary | ICD-10-CM | POA: Diagnosis not present

## 2023-01-29 DIAGNOSIS — K219 Gastro-esophageal reflux disease without esophagitis: Secondary | ICD-10-CM | POA: Diagnosis not present

## 2023-01-29 DIAGNOSIS — M5137 Other intervertebral disc degeneration, lumbosacral region: Secondary | ICD-10-CM | POA: Diagnosis not present

## 2023-01-29 DIAGNOSIS — I110 Hypertensive heart disease with heart failure: Secondary | ICD-10-CM | POA: Diagnosis not present

## 2023-01-29 DIAGNOSIS — E1140 Type 2 diabetes mellitus with diabetic neuropathy, unspecified: Secondary | ICD-10-CM | POA: Diagnosis not present

## 2023-01-29 DIAGNOSIS — I2510 Atherosclerotic heart disease of native coronary artery without angina pectoris: Secondary | ICD-10-CM | POA: Diagnosis not present

## 2023-01-29 DIAGNOSIS — I252 Old myocardial infarction: Secondary | ICD-10-CM | POA: Diagnosis not present

## 2023-02-02 ENCOUNTER — Encounter: Payer: Self-pay | Admitting: Pharmacist

## 2023-02-02 ENCOUNTER — Ambulatory Visit: Payer: Medicare HMO | Attending: Cardiovascular Disease | Admitting: Pharmacist

## 2023-02-02 VITALS — BP 94/58 | HR 71 | Wt 146.8 lb

## 2023-02-02 DIAGNOSIS — I5043 Acute on chronic combined systolic (congestive) and diastolic (congestive) heart failure: Secondary | ICD-10-CM

## 2023-02-02 NOTE — Progress Notes (Signed)
Patient ID: Jose Patel                 DOB: 05-31-1934                      MRN: [id number]     HPI: Jose Patel is a 89 y.o. male referred by Dr. Royann Shivers to pharmacy clinic for HF medication management. PMH is significant for CHF, CKD, T2DM. A fib, HTN, OSA, and HLD. Most recent LVEF 25-30% on 09/15/22.  Patient presents today with daughter who manages his medications and sets his pill box. She has to frequently call and remind him to take his medications. She reports he has not missed any doses this week but last week missed 2 evening doses.  Started on spirolactone at last visit. No patient reported adverse effects. Follow up lab work stable. Currently on BB< MRA, SGLT2 and ARNi. Daughter reports he was dizzy yesterday but had been outside in the heat. Home health comes over frequently.  Has been checking weights which are typically around 146. Has SOB with exertion although he is fairly sedentary. Denies chest pain.  Current CHF meds:  Jardiance 10mg  daily Toprol XL 50mg  daily Entresto 49-51mg  BID Spironolactone 25mg  daily Torsemide MWF Klor-Con MWF  Previously tried:  BP goal: <130/80  Wt Readings from Last 3 Encounters:  12/01/22 154 lb (69.9 kg)  09/25/22 140 lb 6.4 oz (63.7 kg)  06/02/22 162 lb 6.4 oz (73.7 kg)   BP Readings from Last 3 Encounters:  12/01/22 136/78  11/24/22 133/65  09/25/22 (!) 142/60   Pulse Readings from Last 3 Encounters:  12/01/22 71  11/24/22 61  09/25/22 63    Renal function: CrCl cannot be calculated (Patient's most recent lab result is older than the maximum 21 days allowed.).  Past Medical History:  Diagnosis Date   A-fib (HCC)    Anticoagulated on Coumadin    Per records from [facility]    Aortic regurgitation    Aortic stenosis    CHF (congestive heart failure) (HCC)    Coronary artery disease    COVID    CVA (cerebral vascular accident) (HCC)    hx of CVA noted on CT from 02/19/18   Diabetes mellitus, type 2 (HCC)     Gastro-esophageal reflux disease without esophagitis    Per records from [facility] provider[facility]   Heart attack [facility])    History of CT scan of head 02/19/2018   Per records from previous provider, [facility]. Chronic changes small vessel disease   History of ECG    03/26/14- Sinus Tachycardia @ 107 bmp, QRS 78 msec, QT 298 msec, QTc 361 msec. 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 [facility])    Per records from previous provider, [facility]    MI (myocardial infarction) [facility])    Per records from [facility]    Mitral regurgitation    Mitral stenosis    Other intervertebral disc degeneration, lumbar region    Per records from previous provider, [facility]   Radiculopathy, lumbar region    Per records from previous provider, [facility]   Sinus bradycardia    Per records from [facility]  Sleep apnea    Transient ischemic attack    10 to 12 years ago   Typical atrial flutter (HCC)    Per records from [facility]     Current Outpatient Medications on File Prior to Visit  Medication Sig Dispense Refill   Cholecalciferol (VITAMIN D) 125 MCG (5000 UT) CAPS      amiodarone (PACERONE) 200 MG tablet TAKE ONE TABLET BY MOUTH ONE TIME DAILY 90 tablet 0   empagliflozin (JARDIANCE) 10 MG TABS tablet Take 1 tablet (10 mg total) by mouth daily before breakfast. 90 tablet 3   memantine (NAMENDA) 10 MG tablet TAKE ONE TABLET BY MOUTH TWICE DAILY 180 tablet 3   metoprolol succinate (TOPROL-XL) 50 MG 24 hr tablet TAKE ONE TABLET BY MOUTH ONE TIME DAILY 90 tablet 3   potassium chloride (KLOR-CON) 10 MEQ tablet Take 1 tablet (10 mEq total) by mouth every other  day. 90 tablet 3   rivaroxaban (XARELTO) 20 MG TABS tablet TAKE ONE TABLET BY MOUTH ONE TIME DAILY WITH SUPPER 90 tablet 0   sacubitril-valsartan (ENTRESTO) 49-51 MG Take 1 tablet by mouth 2 (two) times daily. 180 tablet 3   spironolactone (ALDACTONE) 25 MG tablet Take 1 tablet (25 mg total) by mouth daily. 90 tablet 3   tamsulosin (FLOMAX) 0.4 MG CAPS capsule TAKE ONE CAPSULE BY MOUTH ONE TIME DAILY 90 capsule 0   Current Facility-Administered Medications on File Prior to Visit  Medication Dose Route Frequency Provider Last Rate Last Admin   sodium chloride flush (NS) 0.9 % injection 3 mL  3 mL Intravenous Q12H Croitoru, Mihai, MD        Allergies  Allergen Reactions   Aricept [Donepezil] Diarrhea, Nausea And Vomiting and Other (See Comments)    Cannot tolerate- bradycardia, also   Tropicamide Nausea And Vomiting    Used to dilate eyes (might be this- was switched to something this was tolerable, after this)   Adhesive [Tape] Rash     Assessment/Plan:  1. CHF -  Patient BP in room 120/60. Rechecked at end of appt and had dropped to 94/58. Patient not having any symptoms of hypotension. Will not titrate any medications at this visit due to soft BP. Advised to continue to check BP and weights at home (or have [facility] nurse continue to monitor) and follow up with Dr Royann Shivers in 3 weeks.  Continue: Jardiance 10mg  daily Toprol XL 50mg  daily Entresto 49-51mg  BID Spironolactone 25mg  daily Torsemide MWF Klor-Con MWF  Laural Golden, PharmD, BCACP, CDCES, CPP [address] [place], [place], [address] Phone: [phone], Fax: [phone]

## 2023-02-02 NOTE — Patient Instructions (Addendum)
It was nice meeting you 2 today  Your blood pressure today looks controlled  Please continue your:  Jardiance 10mg  daily Toprol XL 50mg  daily Entresto 49-51mg  twice a day Spironolactone 25mg  daily Torsemide 20mg  Monday, Wednesday, Friday Potassium Monday, Wednesday, Friday  Continue to monitor your weight at home  Let us know if there are any questions  Laural Golden, PharmD, BCACP, CDCES, CPP [address] [place], [place], [address] Phone: [phone], Fax: [phone]

## 2023-02-07 DIAGNOSIS — I480 Paroxysmal atrial fibrillation: Secondary | ICD-10-CM | POA: Diagnosis not present

## 2023-02-07 DIAGNOSIS — I484 Atypical atrial flutter: Secondary | ICD-10-CM | POA: Diagnosis not present

## 2023-02-07 DIAGNOSIS — E785 Hyperlipidemia, unspecified: Secondary | ICD-10-CM | POA: Diagnosis not present

## 2023-02-07 DIAGNOSIS — I77810 Thoracic aortic ectasia: Secondary | ICD-10-CM | POA: Diagnosis not present

## 2023-02-07 DIAGNOSIS — I5043 Acute on chronic combined systolic (congestive) and diastolic (congestive) heart failure: Secondary | ICD-10-CM | POA: Diagnosis not present

## 2023-02-07 DIAGNOSIS — G4733 Obstructive sleep apnea (adult) (pediatric): Secondary | ICD-10-CM | POA: Diagnosis not present

## 2023-02-07 DIAGNOSIS — Z950 Presence of cardiac pacemaker: Secondary | ICD-10-CM | POA: Diagnosis not present

## 2023-02-07 DIAGNOSIS — E1140 Type 2 diabetes mellitus with diabetic neuropathy, unspecified: Secondary | ICD-10-CM | POA: Diagnosis not present

## 2023-02-07 DIAGNOSIS — I495 Sick sinus syndrome: Secondary | ICD-10-CM | POA: Diagnosis not present

## 2023-02-07 DIAGNOSIS — M5137 Other intervertebral disc degeneration, lumbosacral region: Secondary | ICD-10-CM | POA: Diagnosis not present

## 2023-02-07 DIAGNOSIS — F320 Major depressive disorder, single episode, mild: Secondary | ICD-10-CM | POA: Diagnosis not present

## 2023-02-07 DIAGNOSIS — I2510 Atherosclerotic heart disease of native coronary artery without angina pectoris: Secondary | ICD-10-CM | POA: Diagnosis not present

## 2023-02-07 DIAGNOSIS — Z7984 Long term (current) use of oral hypoglycemic drugs: Secondary | ICD-10-CM | POA: Diagnosis not present

## 2023-02-07 DIAGNOSIS — I252 Old myocardial infarction: Secondary | ICD-10-CM | POA: Diagnosis not present

## 2023-02-07 DIAGNOSIS — I080 Rheumatic disorders of both mitral and aortic valves: Secondary | ICD-10-CM | POA: Diagnosis not present

## 2023-02-07 DIAGNOSIS — K219 Gastro-esophageal reflux disease without esophagitis: Secondary | ICD-10-CM | POA: Diagnosis not present

## 2023-02-07 DIAGNOSIS — N401 Enlarged prostate with lower urinary tract symptoms: Secondary | ICD-10-CM | POA: Diagnosis not present

## 2023-02-07 DIAGNOSIS — E559 Vitamin D deficiency, unspecified: Secondary | ICD-10-CM | POA: Diagnosis not present

## 2023-02-07 DIAGNOSIS — R350 Frequency of micturition: Secondary | ICD-10-CM | POA: Diagnosis not present

## 2023-02-07 DIAGNOSIS — Z9849 Cataract extraction status, unspecified eye: Secondary | ICD-10-CM | POA: Diagnosis not present

## 2023-02-07 DIAGNOSIS — Z7901 Long term (current) use of anticoagulants: Secondary | ICD-10-CM | POA: Diagnosis not present

## 2023-02-07 DIAGNOSIS — Z8616 Personal history of COVID-19: Secondary | ICD-10-CM | POA: Diagnosis not present

## 2023-02-07 DIAGNOSIS — I110 Hypertensive heart disease with heart failure: Secondary | ICD-10-CM | POA: Diagnosis not present

## 2023-02-07 DIAGNOSIS — D6859 Other primary thrombophilia: Secondary | ICD-10-CM | POA: Diagnosis not present

## 2023-02-07 DIAGNOSIS — M5416 Radiculopathy, lumbar region: Secondary | ICD-10-CM | POA: Diagnosis not present

## 2023-02-07 NOTE — Progress Notes (Signed)
Remote pacemaker transmission.   

## 2023-02-12 DIAGNOSIS — I080 Rheumatic disorders of both mitral and aortic valves: Secondary | ICD-10-CM | POA: Diagnosis not present

## 2023-02-12 DIAGNOSIS — M5137 Other intervertebral disc degeneration, lumbosacral region: Secondary | ICD-10-CM | POA: Diagnosis not present

## 2023-02-12 DIAGNOSIS — F320 Major depressive disorder, single episode, mild: Secondary | ICD-10-CM | POA: Diagnosis not present

## 2023-02-12 DIAGNOSIS — K219 Gastro-esophageal reflux disease without esophagitis: Secondary | ICD-10-CM | POA: Diagnosis not present

## 2023-02-12 DIAGNOSIS — R350 Frequency of micturition: Secondary | ICD-10-CM | POA: Diagnosis not present

## 2023-02-12 DIAGNOSIS — Z9849 Cataract extraction status, unspecified eye: Secondary | ICD-10-CM | POA: Diagnosis not present

## 2023-02-12 DIAGNOSIS — Z7901 Long term (current) use of anticoagulants: Secondary | ICD-10-CM | POA: Diagnosis not present

## 2023-02-12 DIAGNOSIS — Z950 Presence of cardiac pacemaker: Secondary | ICD-10-CM | POA: Diagnosis not present

## 2023-02-12 DIAGNOSIS — E785 Hyperlipidemia, unspecified: Secondary | ICD-10-CM | POA: Diagnosis not present

## 2023-02-12 DIAGNOSIS — I484 Atypical atrial flutter: Secondary | ICD-10-CM | POA: Diagnosis not present

## 2023-02-12 DIAGNOSIS — E559 Vitamin D deficiency, unspecified: Secondary | ICD-10-CM | POA: Diagnosis not present

## 2023-02-12 DIAGNOSIS — I495 Sick sinus syndrome: Secondary | ICD-10-CM | POA: Diagnosis not present

## 2023-02-12 DIAGNOSIS — E1140 Type 2 diabetes mellitus with diabetic neuropathy, unspecified: Secondary | ICD-10-CM | POA: Diagnosis not present

## 2023-02-12 DIAGNOSIS — G4733 Obstructive sleep apnea (adult) (pediatric): Secondary | ICD-10-CM | POA: Diagnosis not present

## 2023-02-12 DIAGNOSIS — Z7984 Long term (current) use of oral hypoglycemic drugs: Secondary | ICD-10-CM | POA: Diagnosis not present

## 2023-02-12 DIAGNOSIS — M5416 Radiculopathy, lumbar region: Secondary | ICD-10-CM | POA: Diagnosis not present

## 2023-02-12 DIAGNOSIS — I480 Paroxysmal atrial fibrillation: Secondary | ICD-10-CM | POA: Diagnosis not present

## 2023-02-12 DIAGNOSIS — I2510 Atherosclerotic heart disease of native coronary artery without angina pectoris: Secondary | ICD-10-CM | POA: Diagnosis not present

## 2023-02-12 DIAGNOSIS — N401 Enlarged prostate with lower urinary tract symptoms: Secondary | ICD-10-CM | POA: Diagnosis not present

## 2023-02-12 DIAGNOSIS — I110 Hypertensive heart disease with heart failure: Secondary | ICD-10-CM | POA: Diagnosis not present

## 2023-02-12 DIAGNOSIS — I252 Old myocardial infarction: Secondary | ICD-10-CM | POA: Diagnosis not present

## 2023-02-12 DIAGNOSIS — Z8616 Personal history of COVID-19: Secondary | ICD-10-CM | POA: Diagnosis not present

## 2023-02-12 DIAGNOSIS — D6859 Other primary thrombophilia: Secondary | ICD-10-CM | POA: Diagnosis not present

## 2023-02-12 DIAGNOSIS — I5043 Acute on chronic combined systolic (congestive) and diastolic (congestive) heart failure: Secondary | ICD-10-CM | POA: Diagnosis not present

## 2023-02-12 DIAGNOSIS — I77810 Thoracic aortic ectasia: Secondary | ICD-10-CM | POA: Diagnosis not present

## 2023-02-14 DIAGNOSIS — I480 Paroxysmal atrial fibrillation: Secondary | ICD-10-CM | POA: Diagnosis not present

## 2023-02-14 DIAGNOSIS — N401 Enlarged prostate with lower urinary tract symptoms: Secondary | ICD-10-CM | POA: Diagnosis not present

## 2023-02-14 DIAGNOSIS — I77810 Thoracic aortic ectasia: Secondary | ICD-10-CM | POA: Diagnosis not present

## 2023-02-14 DIAGNOSIS — Z9849 Cataract extraction status, unspecified eye: Secondary | ICD-10-CM | POA: Diagnosis not present

## 2023-02-14 DIAGNOSIS — Z7984 Long term (current) use of oral hypoglycemic drugs: Secondary | ICD-10-CM | POA: Diagnosis not present

## 2023-02-14 DIAGNOSIS — I484 Atypical atrial flutter: Secondary | ICD-10-CM | POA: Diagnosis not present

## 2023-02-14 DIAGNOSIS — Z950 Presence of cardiac pacemaker: Secondary | ICD-10-CM | POA: Diagnosis not present

## 2023-02-14 DIAGNOSIS — M5137 Other intervertebral disc degeneration, lumbosacral region: Secondary | ICD-10-CM | POA: Diagnosis not present

## 2023-02-14 DIAGNOSIS — F320 Major depressive disorder, single episode, mild: Secondary | ICD-10-CM | POA: Diagnosis not present

## 2023-02-14 DIAGNOSIS — I252 Old myocardial infarction: Secondary | ICD-10-CM | POA: Diagnosis not present

## 2023-02-14 DIAGNOSIS — I495 Sick sinus syndrome: Secondary | ICD-10-CM | POA: Diagnosis not present

## 2023-02-14 DIAGNOSIS — G4733 Obstructive sleep apnea (adult) (pediatric): Secondary | ICD-10-CM | POA: Diagnosis not present

## 2023-02-14 DIAGNOSIS — D6859 Other primary thrombophilia: Secondary | ICD-10-CM | POA: Diagnosis not present

## 2023-02-14 DIAGNOSIS — I2510 Atherosclerotic heart disease of native coronary artery without angina pectoris: Secondary | ICD-10-CM | POA: Diagnosis not present

## 2023-02-14 DIAGNOSIS — I5043 Acute on chronic combined systolic (congestive) and diastolic (congestive) heart failure: Secondary | ICD-10-CM | POA: Diagnosis not present

## 2023-02-14 DIAGNOSIS — M5416 Radiculopathy, lumbar region: Secondary | ICD-10-CM | POA: Diagnosis not present

## 2023-02-14 DIAGNOSIS — Z7901 Long term (current) use of anticoagulants: Secondary | ICD-10-CM | POA: Diagnosis not present

## 2023-02-14 DIAGNOSIS — K219 Gastro-esophageal reflux disease without esophagitis: Secondary | ICD-10-CM | POA: Diagnosis not present

## 2023-02-14 DIAGNOSIS — E1140 Type 2 diabetes mellitus with diabetic neuropathy, unspecified: Secondary | ICD-10-CM | POA: Diagnosis not present

## 2023-02-14 DIAGNOSIS — I110 Hypertensive heart disease with heart failure: Secondary | ICD-10-CM | POA: Diagnosis not present

## 2023-02-14 DIAGNOSIS — I080 Rheumatic disorders of both mitral and aortic valves: Secondary | ICD-10-CM | POA: Diagnosis not present

## 2023-02-14 DIAGNOSIS — E559 Vitamin D deficiency, unspecified: Secondary | ICD-10-CM | POA: Diagnosis not present

## 2023-02-14 DIAGNOSIS — Z8616 Personal history of COVID-19: Secondary | ICD-10-CM | POA: Diagnosis not present

## 2023-02-14 DIAGNOSIS — R350 Frequency of micturition: Secondary | ICD-10-CM | POA: Diagnosis not present

## 2023-02-14 DIAGNOSIS — E785 Hyperlipidemia, unspecified: Secondary | ICD-10-CM | POA: Diagnosis not present

## 2023-02-16 DIAGNOSIS — E559 Vitamin D deficiency, unspecified: Secondary | ICD-10-CM | POA: Diagnosis not present

## 2023-02-16 DIAGNOSIS — G4733 Obstructive sleep apnea (adult) (pediatric): Secondary | ICD-10-CM | POA: Diagnosis not present

## 2023-02-16 DIAGNOSIS — N401 Enlarged prostate with lower urinary tract symptoms: Secondary | ICD-10-CM | POA: Diagnosis not present

## 2023-02-16 DIAGNOSIS — I484 Atypical atrial flutter: Secondary | ICD-10-CM | POA: Diagnosis not present

## 2023-02-16 DIAGNOSIS — M5137 Other intervertebral disc degeneration, lumbosacral region: Secondary | ICD-10-CM | POA: Diagnosis not present

## 2023-02-16 DIAGNOSIS — M5416 Radiculopathy, lumbar region: Secondary | ICD-10-CM | POA: Diagnosis not present

## 2023-02-16 DIAGNOSIS — K219 Gastro-esophageal reflux disease without esophagitis: Secondary | ICD-10-CM | POA: Diagnosis not present

## 2023-02-16 DIAGNOSIS — I252 Old myocardial infarction: Secondary | ICD-10-CM | POA: Diagnosis not present

## 2023-02-16 DIAGNOSIS — I495 Sick sinus syndrome: Secondary | ICD-10-CM | POA: Diagnosis not present

## 2023-02-16 DIAGNOSIS — Z7901 Long term (current) use of anticoagulants: Secondary | ICD-10-CM | POA: Diagnosis not present

## 2023-02-16 DIAGNOSIS — Z9849 Cataract extraction status, unspecified eye: Secondary | ICD-10-CM | POA: Diagnosis not present

## 2023-02-16 DIAGNOSIS — I480 Paroxysmal atrial fibrillation: Secondary | ICD-10-CM | POA: Diagnosis not present

## 2023-02-16 DIAGNOSIS — D6859 Other primary thrombophilia: Secondary | ICD-10-CM | POA: Diagnosis not present

## 2023-02-16 DIAGNOSIS — Z7984 Long term (current) use of oral hypoglycemic drugs: Secondary | ICD-10-CM | POA: Diagnosis not present

## 2023-02-16 DIAGNOSIS — R350 Frequency of micturition: Secondary | ICD-10-CM | POA: Diagnosis not present

## 2023-02-16 DIAGNOSIS — I77810 Thoracic aortic ectasia: Secondary | ICD-10-CM | POA: Diagnosis not present

## 2023-02-16 DIAGNOSIS — E785 Hyperlipidemia, unspecified: Secondary | ICD-10-CM | POA: Diagnosis not present

## 2023-02-16 DIAGNOSIS — Z950 Presence of cardiac pacemaker: Secondary | ICD-10-CM | POA: Diagnosis not present

## 2023-02-16 DIAGNOSIS — I2510 Atherosclerotic heart disease of native coronary artery without angina pectoris: Secondary | ICD-10-CM | POA: Diagnosis not present

## 2023-02-16 DIAGNOSIS — F320 Major depressive disorder, single episode, mild: Secondary | ICD-10-CM | POA: Diagnosis not present

## 2023-02-16 DIAGNOSIS — I080 Rheumatic disorders of both mitral and aortic valves: Secondary | ICD-10-CM | POA: Diagnosis not present

## 2023-02-16 DIAGNOSIS — I110 Hypertensive heart disease with heart failure: Secondary | ICD-10-CM | POA: Diagnosis not present

## 2023-02-16 DIAGNOSIS — Z8616 Personal history of COVID-19: Secondary | ICD-10-CM | POA: Diagnosis not present

## 2023-02-16 DIAGNOSIS — E1140 Type 2 diabetes mellitus with diabetic neuropathy, unspecified: Secondary | ICD-10-CM | POA: Diagnosis not present

## 2023-02-16 DIAGNOSIS — I5043 Acute on chronic combined systolic (congestive) and diastolic (congestive) heart failure: Secondary | ICD-10-CM | POA: Diagnosis not present

## 2023-02-19 DIAGNOSIS — N401 Enlarged prostate with lower urinary tract symptoms: Secondary | ICD-10-CM | POA: Diagnosis not present

## 2023-02-19 DIAGNOSIS — Z9849 Cataract extraction status, unspecified eye: Secondary | ICD-10-CM | POA: Diagnosis not present

## 2023-02-19 DIAGNOSIS — G4733 Obstructive sleep apnea (adult) (pediatric): Secondary | ICD-10-CM | POA: Diagnosis not present

## 2023-02-19 DIAGNOSIS — I252 Old myocardial infarction: Secondary | ICD-10-CM | POA: Diagnosis not present

## 2023-02-19 DIAGNOSIS — Z8616 Personal history of COVID-19: Secondary | ICD-10-CM | POA: Diagnosis not present

## 2023-02-19 DIAGNOSIS — D6859 Other primary thrombophilia: Secondary | ICD-10-CM | POA: Diagnosis not present

## 2023-02-19 DIAGNOSIS — I495 Sick sinus syndrome: Secondary | ICD-10-CM | POA: Diagnosis not present

## 2023-02-19 DIAGNOSIS — Z7901 Long term (current) use of anticoagulants: Secondary | ICD-10-CM | POA: Diagnosis not present

## 2023-02-19 DIAGNOSIS — E785 Hyperlipidemia, unspecified: Secondary | ICD-10-CM | POA: Diagnosis not present

## 2023-02-19 DIAGNOSIS — M5137 Other intervertebral disc degeneration, lumbosacral region: Secondary | ICD-10-CM | POA: Diagnosis not present

## 2023-02-19 DIAGNOSIS — I080 Rheumatic disorders of both mitral and aortic valves: Secondary | ICD-10-CM | POA: Diagnosis not present

## 2023-02-19 DIAGNOSIS — M5416 Radiculopathy, lumbar region: Secondary | ICD-10-CM | POA: Diagnosis not present

## 2023-02-19 DIAGNOSIS — R350 Frequency of micturition: Secondary | ICD-10-CM | POA: Diagnosis not present

## 2023-02-19 DIAGNOSIS — I2510 Atherosclerotic heart disease of native coronary artery without angina pectoris: Secondary | ICD-10-CM | POA: Diagnosis not present

## 2023-02-19 DIAGNOSIS — F320 Major depressive disorder, single episode, mild: Secondary | ICD-10-CM | POA: Diagnosis not present

## 2023-02-19 DIAGNOSIS — E559 Vitamin D deficiency, unspecified: Secondary | ICD-10-CM | POA: Diagnosis not present

## 2023-02-19 DIAGNOSIS — I110 Hypertensive heart disease with heart failure: Secondary | ICD-10-CM | POA: Diagnosis not present

## 2023-02-19 DIAGNOSIS — I480 Paroxysmal atrial fibrillation: Secondary | ICD-10-CM | POA: Diagnosis not present

## 2023-02-19 DIAGNOSIS — K219 Gastro-esophageal reflux disease without esophagitis: Secondary | ICD-10-CM | POA: Diagnosis not present

## 2023-02-19 DIAGNOSIS — Z7984 Long term (current) use of oral hypoglycemic drugs: Secondary | ICD-10-CM | POA: Diagnosis not present

## 2023-02-19 DIAGNOSIS — E1140 Type 2 diabetes mellitus with diabetic neuropathy, unspecified: Secondary | ICD-10-CM | POA: Diagnosis not present

## 2023-02-19 DIAGNOSIS — I484 Atypical atrial flutter: Secondary | ICD-10-CM | POA: Diagnosis not present

## 2023-02-19 DIAGNOSIS — I5043 Acute on chronic combined systolic (congestive) and diastolic (congestive) heart failure: Secondary | ICD-10-CM | POA: Diagnosis not present

## 2023-02-19 DIAGNOSIS — Z950 Presence of cardiac pacemaker: Secondary | ICD-10-CM | POA: Diagnosis not present

## 2023-02-19 DIAGNOSIS — I77810 Thoracic aortic ectasia: Secondary | ICD-10-CM | POA: Diagnosis not present

## 2023-02-20 ENCOUNTER — Other Ambulatory Visit: Payer: Self-pay | Admitting: Nurse Practitioner

## 2023-02-20 DIAGNOSIS — N401 Enlarged prostate with lower urinary tract symptoms: Secondary | ICD-10-CM

## 2023-02-20 DIAGNOSIS — I480 Paroxysmal atrial fibrillation: Secondary | ICD-10-CM

## 2023-02-21 ENCOUNTER — Encounter: Payer: Self-pay | Admitting: Cardiovascular Disease

## 2023-02-21 ENCOUNTER — Ambulatory Visit: Payer: Medicare HMO | Attending: Cardiovascular Disease | Admitting: Cardiovascular Disease

## 2023-02-21 VITALS — BP 90/52 | HR 72 | Ht 70.0 in | Wt 145.4 lb

## 2023-02-21 DIAGNOSIS — G4733 Obstructive sleep apnea (adult) (pediatric): Secondary | ICD-10-CM | POA: Diagnosis not present

## 2023-02-21 DIAGNOSIS — Z8616 Personal history of COVID-19: Secondary | ICD-10-CM | POA: Diagnosis not present

## 2023-02-21 DIAGNOSIS — I2510 Atherosclerotic heart disease of native coronary artery without angina pectoris: Secondary | ICD-10-CM

## 2023-02-21 DIAGNOSIS — Z9849 Cataract extraction status, unspecified eye: Secondary | ICD-10-CM | POA: Diagnosis not present

## 2023-02-21 DIAGNOSIS — I4719 Other supraventricular tachycardia: Secondary | ICD-10-CM

## 2023-02-21 DIAGNOSIS — I5042 Chronic combined systolic (congestive) and diastolic (congestive) heart failure: Secondary | ICD-10-CM

## 2023-02-21 DIAGNOSIS — I351 Nonrheumatic aortic (valve) insufficiency: Secondary | ICD-10-CM | POA: Diagnosis not present

## 2023-02-21 DIAGNOSIS — D6859 Other primary thrombophilia: Secondary | ICD-10-CM | POA: Diagnosis not present

## 2023-02-21 DIAGNOSIS — N401 Enlarged prostate with lower urinary tract symptoms: Secondary | ICD-10-CM | POA: Diagnosis not present

## 2023-02-21 DIAGNOSIS — I495 Sick sinus syndrome: Secondary | ICD-10-CM | POA: Diagnosis not present

## 2023-02-21 DIAGNOSIS — I480 Paroxysmal atrial fibrillation: Secondary | ICD-10-CM | POA: Diagnosis not present

## 2023-02-21 DIAGNOSIS — R350 Frequency of micturition: Secondary | ICD-10-CM | POA: Diagnosis not present

## 2023-02-21 DIAGNOSIS — I110 Hypertensive heart disease with heart failure: Secondary | ICD-10-CM | POA: Diagnosis not present

## 2023-02-21 DIAGNOSIS — E7800 Pure hypercholesterolemia, unspecified: Secondary | ICD-10-CM | POA: Diagnosis not present

## 2023-02-21 DIAGNOSIS — Z79899 Other long term (current) drug therapy: Secondary | ICD-10-CM | POA: Diagnosis not present

## 2023-02-21 DIAGNOSIS — M5416 Radiculopathy, lumbar region: Secondary | ICD-10-CM | POA: Diagnosis not present

## 2023-02-21 DIAGNOSIS — M5137 Other intervertebral disc degeneration, lumbosacral region: Secondary | ICD-10-CM | POA: Diagnosis not present

## 2023-02-21 DIAGNOSIS — E785 Hyperlipidemia, unspecified: Secondary | ICD-10-CM | POA: Diagnosis not present

## 2023-02-21 DIAGNOSIS — I484 Atypical atrial flutter: Secondary | ICD-10-CM | POA: Diagnosis not present

## 2023-02-21 DIAGNOSIS — D6869 Other thrombophilia: Secondary | ICD-10-CM | POA: Diagnosis not present

## 2023-02-21 DIAGNOSIS — Z950 Presence of cardiac pacemaker: Secondary | ICD-10-CM | POA: Diagnosis not present

## 2023-02-21 DIAGNOSIS — I5030 Unspecified diastolic (congestive) heart failure: Secondary | ICD-10-CM

## 2023-02-21 DIAGNOSIS — I77810 Thoracic aortic ectasia: Secondary | ICD-10-CM | POA: Diagnosis not present

## 2023-02-21 DIAGNOSIS — E559 Vitamin D deficiency, unspecified: Secondary | ICD-10-CM | POA: Diagnosis not present

## 2023-02-21 DIAGNOSIS — I5043 Acute on chronic combined systolic (congestive) and diastolic (congestive) heart failure: Secondary | ICD-10-CM | POA: Diagnosis not present

## 2023-02-21 DIAGNOSIS — Z7984 Long term (current) use of oral hypoglycemic drugs: Secondary | ICD-10-CM | POA: Diagnosis not present

## 2023-02-21 DIAGNOSIS — I080 Rheumatic disorders of both mitral and aortic valves: Secondary | ICD-10-CM | POA: Diagnosis not present

## 2023-02-21 DIAGNOSIS — K219 Gastro-esophageal reflux disease without esophagitis: Secondary | ICD-10-CM | POA: Diagnosis not present

## 2023-02-21 DIAGNOSIS — I10 Essential (primary) hypertension: Secondary | ICD-10-CM

## 2023-02-21 DIAGNOSIS — E119 Type 2 diabetes mellitus without complications: Secondary | ICD-10-CM

## 2023-02-21 DIAGNOSIS — I252 Old myocardial infarction: Secondary | ICD-10-CM | POA: Diagnosis not present

## 2023-02-21 DIAGNOSIS — E1140 Type 2 diabetes mellitus with diabetic neuropathy, unspecified: Secondary | ICD-10-CM | POA: Diagnosis not present

## 2023-02-21 DIAGNOSIS — Z7901 Long term (current) use of anticoagulants: Secondary | ICD-10-CM | POA: Diagnosis not present

## 2023-02-21 DIAGNOSIS — F320 Major depressive disorder, single episode, mild: Secondary | ICD-10-CM | POA: Diagnosis not present

## 2023-02-21 MED ORDER — AMIODARONE HCL 200 MG PO TABS
200.0000 mg | ORAL_TABLET | Freq: Every day | ORAL | 1 refills | Status: DC
Start: 2023-02-21 — End: 2023-02-21

## 2023-02-21 MED ORDER — TORSEMIDE 20 MG PO TABS: 20.0000 mg | ORAL_TABLET | ORAL | 1 refills | Status: DC

## 2023-02-21 MED ORDER — TAMSULOSIN HCL 0.4 MG PO CAPS
0.4000 mg | ORAL_CAPSULE | Freq: Every day | ORAL | 1 refills | Status: DC
Start: 2023-02-21 — End: 2023-05-26

## 2023-02-21 MED ORDER — SPIRONOLACTONE 25 MG PO TABS: 12.5000 mg | ORAL_TABLET | Freq: Every day | ORAL | 3 refills | Status: DC

## 2023-02-21 MED ORDER — AMIODARONE HCL 100 MG PO TABS
100.0000 mg | ORAL_TABLET | Freq: Every day | ORAL | 3 refills | Status: DC
Start: 2023-02-21 — End: 2023-06-08

## 2023-02-21 MED ORDER — TORSEMIDE 10 MG PO TABS: ORAL_TABLET | ORAL | 3 refills | Status: DC

## 2023-02-21 NOTE — Patient Instructions (Signed)
Medication Instructions:  Decrease Amiodarone to 100 mg a day Decrease Spironolactone to 12.5 mg every morning Decrease Torsemide to 10 mg every Monday and Thursday morning *If you need a refill on your cardiac medications before your next appointment, please call your pharmacy*   Lab Work: CMP, TSH, Lipid panel- Please return for Blood Work in 3 months. No appointment needed, lab here at the office is open Monday-Friday from 8AM to 4PM and closed daily for lunch from 12:45-1:45.   If you have labs (blood work) drawn today and your tests are completely normal, you will receive your results only by: MyChart Message (if you have MyChart) OR A paper copy in the mail If you have any lab test that is abnormal or we need to change your treatment, we will call you to review the results.  Follow-Up: At [facility], you and your health needs are our priority.  As part of our continuing mission to provide you with exceptional heart care, we have created designated Provider Care Teams.  These Care Teams include your primary Cardiologist (physician) and Advanced Practice Providers (APPs -  Physician Assistants and Nurse Practitioners) who all work together to provide you with the care you need, when you need it.  We recommend signing up for the patient portal called "MyChart".  Sign up information is provided on this After Visit Summary.  MyChart is used to connect with patients for Virtual Visits (Telemedicine).  Patients are able to view lab/test results, encounter notes, upcoming appointments, etc.  Non-urgent messages can be sent to your provider as well.   To learn more about what you can do with MyChart, go to [email or web address].    Your next appointment:   1 year(s)  Provider:   Thurmon Fair, MD
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
## 2023-02-25 ENCOUNTER — Encounter: Payer: Self-pay | Admitting: Cardiovascular Disease

## 2023-02-26 DIAGNOSIS — M5416 Radiculopathy, lumbar region: Secondary | ICD-10-CM | POA: Diagnosis not present

## 2023-02-26 DIAGNOSIS — G4733 Obstructive sleep apnea (adult) (pediatric): Secondary | ICD-10-CM | POA: Diagnosis not present

## 2023-02-26 DIAGNOSIS — I480 Paroxysmal atrial fibrillation: Secondary | ICD-10-CM | POA: Diagnosis not present

## 2023-02-26 DIAGNOSIS — I77810 Thoracic aortic ectasia: Secondary | ICD-10-CM | POA: Diagnosis not present

## 2023-02-26 DIAGNOSIS — Z7984 Long term (current) use of oral hypoglycemic drugs: Secondary | ICD-10-CM | POA: Diagnosis not present

## 2023-02-26 DIAGNOSIS — I110 Hypertensive heart disease with heart failure: Secondary | ICD-10-CM | POA: Diagnosis not present

## 2023-02-26 DIAGNOSIS — E559 Vitamin D deficiency, unspecified: Secondary | ICD-10-CM | POA: Diagnosis not present

## 2023-02-26 DIAGNOSIS — R350 Frequency of micturition: Secondary | ICD-10-CM | POA: Diagnosis not present

## 2023-02-26 DIAGNOSIS — E785 Hyperlipidemia, unspecified: Secondary | ICD-10-CM | POA: Diagnosis not present

## 2023-02-26 DIAGNOSIS — N401 Enlarged prostate with lower urinary tract symptoms: Secondary | ICD-10-CM | POA: Diagnosis not present

## 2023-02-26 DIAGNOSIS — I080 Rheumatic disorders of both mitral and aortic valves: Secondary | ICD-10-CM | POA: Diagnosis not present

## 2023-02-26 DIAGNOSIS — I484 Atypical atrial flutter: Secondary | ICD-10-CM | POA: Diagnosis not present

## 2023-02-26 DIAGNOSIS — D6859 Other primary thrombophilia: Secondary | ICD-10-CM | POA: Diagnosis not present

## 2023-02-26 DIAGNOSIS — Z950 Presence of cardiac pacemaker: Secondary | ICD-10-CM | POA: Diagnosis not present

## 2023-02-26 DIAGNOSIS — I252 Old myocardial infarction: Secondary | ICD-10-CM | POA: Diagnosis not present

## 2023-02-26 DIAGNOSIS — Z8616 Personal history of COVID-19: Secondary | ICD-10-CM | POA: Diagnosis not present

## 2023-02-26 DIAGNOSIS — I2510 Atherosclerotic heart disease of native coronary artery without angina pectoris: Secondary | ICD-10-CM | POA: Diagnosis not present

## 2023-02-26 DIAGNOSIS — E1140 Type 2 diabetes mellitus with diabetic neuropathy, unspecified: Secondary | ICD-10-CM | POA: Diagnosis not present

## 2023-02-26 DIAGNOSIS — Z9849 Cataract extraction status, unspecified eye: Secondary | ICD-10-CM | POA: Diagnosis not present

## 2023-02-26 DIAGNOSIS — K219 Gastro-esophageal reflux disease without esophagitis: Secondary | ICD-10-CM | POA: Diagnosis not present

## 2023-02-26 DIAGNOSIS — I495 Sick sinus syndrome: Secondary | ICD-10-CM | POA: Diagnosis not present

## 2023-02-26 DIAGNOSIS — Z7901 Long term (current) use of anticoagulants: Secondary | ICD-10-CM | POA: Diagnosis not present

## 2023-02-26 DIAGNOSIS — M5137 Other intervertebral disc degeneration, lumbosacral region: Secondary | ICD-10-CM | POA: Diagnosis not present

## 2023-02-26 DIAGNOSIS — I5043 Acute on chronic combined systolic (congestive) and diastolic (congestive) heart failure: Secondary | ICD-10-CM | POA: Diagnosis not present

## 2023-02-26 DIAGNOSIS — F320 Major depressive disorder, single episode, mild: Secondary | ICD-10-CM | POA: Diagnosis not present

## 2023-02-28 DIAGNOSIS — G4733 Obstructive sleep apnea (adult) (pediatric): Secondary | ICD-10-CM | POA: Diagnosis not present

## 2023-02-28 DIAGNOSIS — Z950 Presence of cardiac pacemaker: Secondary | ICD-10-CM | POA: Diagnosis not present

## 2023-02-28 DIAGNOSIS — I252 Old myocardial infarction: Secondary | ICD-10-CM | POA: Diagnosis not present

## 2023-02-28 DIAGNOSIS — Z9849 Cataract extraction status, unspecified eye: Secondary | ICD-10-CM | POA: Diagnosis not present

## 2023-02-28 DIAGNOSIS — M5137 Other intervertebral disc degeneration, lumbosacral region: Secondary | ICD-10-CM | POA: Diagnosis not present

## 2023-02-28 DIAGNOSIS — I495 Sick sinus syndrome: Secondary | ICD-10-CM | POA: Diagnosis not present

## 2023-02-28 DIAGNOSIS — M5416 Radiculopathy, lumbar region: Secondary | ICD-10-CM | POA: Diagnosis not present

## 2023-02-28 DIAGNOSIS — Z7984 Long term (current) use of oral hypoglycemic drugs: Secondary | ICD-10-CM | POA: Diagnosis not present

## 2023-02-28 DIAGNOSIS — I5043 Acute on chronic combined systolic (congestive) and diastolic (congestive) heart failure: Secondary | ICD-10-CM | POA: Diagnosis not present

## 2023-02-28 DIAGNOSIS — Z8616 Personal history of COVID-19: Secondary | ICD-10-CM | POA: Diagnosis not present

## 2023-02-28 DIAGNOSIS — K219 Gastro-esophageal reflux disease without esophagitis: Secondary | ICD-10-CM | POA: Diagnosis not present

## 2023-02-28 DIAGNOSIS — I2510 Atherosclerotic heart disease of native coronary artery without angina pectoris: Secondary | ICD-10-CM | POA: Diagnosis not present

## 2023-02-28 DIAGNOSIS — Z7901 Long term (current) use of anticoagulants: Secondary | ICD-10-CM | POA: Diagnosis not present

## 2023-02-28 DIAGNOSIS — E785 Hyperlipidemia, unspecified: Secondary | ICD-10-CM | POA: Diagnosis not present

## 2023-02-28 DIAGNOSIS — E559 Vitamin D deficiency, unspecified: Secondary | ICD-10-CM | POA: Diagnosis not present

## 2023-02-28 DIAGNOSIS — D6859 Other primary thrombophilia: Secondary | ICD-10-CM | POA: Diagnosis not present

## 2023-02-28 DIAGNOSIS — N401 Enlarged prostate with lower urinary tract symptoms: Secondary | ICD-10-CM | POA: Diagnosis not present

## 2023-02-28 DIAGNOSIS — E1140 Type 2 diabetes mellitus with diabetic neuropathy, unspecified: Secondary | ICD-10-CM | POA: Diagnosis not present

## 2023-02-28 DIAGNOSIS — I77810 Thoracic aortic ectasia: Secondary | ICD-10-CM | POA: Diagnosis not present

## 2023-02-28 DIAGNOSIS — R350 Frequency of micturition: Secondary | ICD-10-CM | POA: Diagnosis not present

## 2023-02-28 DIAGNOSIS — I080 Rheumatic disorders of both mitral and aortic valves: Secondary | ICD-10-CM | POA: Diagnosis not present

## 2023-02-28 DIAGNOSIS — I110 Hypertensive heart disease with heart failure: Secondary | ICD-10-CM | POA: Diagnosis not present

## 2023-02-28 DIAGNOSIS — I484 Atypical atrial flutter: Secondary | ICD-10-CM | POA: Diagnosis not present

## 2023-02-28 DIAGNOSIS — F320 Major depressive disorder, single episode, mild: Secondary | ICD-10-CM | POA: Diagnosis not present

## 2023-02-28 DIAGNOSIS — I480 Paroxysmal atrial fibrillation: Secondary | ICD-10-CM | POA: Diagnosis not present

## 2023-03-02 ENCOUNTER — Telehealth: Payer: Self-pay

## 2023-03-02 NOTE — Telephone Encounter (Signed)
Patient's daughter Jose Patel called and left message on clinical intake voicemail stating that patient's home care had requested order for U/A,but when she got to [facility] there wasn't an order.  She called back and left another voicemail stating that she realized that the home care did not call our office to request order for U/A. She would like order faxed to [facility] [phone].   I called and spoke with Jose Patel. She stated that patient had a fall on Saturday and complained of nausea on Wednesday when his home care aide was there. She stated that the home care suggested that a urine sample be collected to rule out UTI. Patient has no confusion. When patient fell he scraped the top oh his head and has bruised on his arm and hip,but seems to be fine. Patient says he fell because he walker "just got away from him". Patient has no burning, or urgency. She states he always has urinary frequency.  Message sent to Abbey Chatters, NP

## 2023-03-02 NOTE — Telephone Encounter (Signed)
Does not sound like he needs a UA. Would not collect unless he was having burning with urination, abdominal pain, fever or something symptoms specific to a UTI

## 2023-03-03 DIAGNOSIS — M5137 Other intervertebral disc degeneration, lumbosacral region: Secondary | ICD-10-CM | POA: Diagnosis not present

## 2023-03-03 DIAGNOSIS — I110 Hypertensive heart disease with heart failure: Secondary | ICD-10-CM | POA: Diagnosis not present

## 2023-03-03 DIAGNOSIS — E1140 Type 2 diabetes mellitus with diabetic neuropathy, unspecified: Secondary | ICD-10-CM | POA: Diagnosis not present

## 2023-03-03 DIAGNOSIS — Z8616 Personal history of COVID-19: Secondary | ICD-10-CM | POA: Diagnosis not present

## 2023-03-03 DIAGNOSIS — F320 Major depressive disorder, single episode, mild: Secondary | ICD-10-CM | POA: Diagnosis not present

## 2023-03-03 DIAGNOSIS — N401 Enlarged prostate with lower urinary tract symptoms: Secondary | ICD-10-CM | POA: Diagnosis not present

## 2023-03-03 DIAGNOSIS — E559 Vitamin D deficiency, unspecified: Secondary | ICD-10-CM | POA: Diagnosis not present

## 2023-03-03 DIAGNOSIS — Z950 Presence of cardiac pacemaker: Secondary | ICD-10-CM | POA: Diagnosis not present

## 2023-03-03 DIAGNOSIS — Z7901 Long term (current) use of anticoagulants: Secondary | ICD-10-CM | POA: Diagnosis not present

## 2023-03-03 DIAGNOSIS — M5416 Radiculopathy, lumbar region: Secondary | ICD-10-CM | POA: Diagnosis not present

## 2023-03-03 DIAGNOSIS — E785 Hyperlipidemia, unspecified: Secondary | ICD-10-CM | POA: Diagnosis not present

## 2023-03-03 DIAGNOSIS — I252 Old myocardial infarction: Secondary | ICD-10-CM | POA: Diagnosis not present

## 2023-03-03 DIAGNOSIS — R350 Frequency of micturition: Secondary | ICD-10-CM | POA: Diagnosis not present

## 2023-03-03 DIAGNOSIS — I5043 Acute on chronic combined systolic (congestive) and diastolic (congestive) heart failure: Secondary | ICD-10-CM | POA: Diagnosis not present

## 2023-03-03 DIAGNOSIS — G4733 Obstructive sleep apnea (adult) (pediatric): Secondary | ICD-10-CM | POA: Diagnosis not present

## 2023-03-03 DIAGNOSIS — D6859 Other primary thrombophilia: Secondary | ICD-10-CM | POA: Diagnosis not present

## 2023-03-03 DIAGNOSIS — Z7984 Long term (current) use of oral hypoglycemic drugs: Secondary | ICD-10-CM | POA: Diagnosis not present

## 2023-03-03 DIAGNOSIS — I2510 Atherosclerotic heart disease of native coronary artery without angina pectoris: Secondary | ICD-10-CM | POA: Diagnosis not present

## 2023-03-03 DIAGNOSIS — I480 Paroxysmal atrial fibrillation: Secondary | ICD-10-CM | POA: Diagnosis not present

## 2023-03-03 DIAGNOSIS — Z9849 Cataract extraction status, unspecified eye: Secondary | ICD-10-CM | POA: Diagnosis not present

## 2023-03-03 DIAGNOSIS — I080 Rheumatic disorders of both mitral and aortic valves: Secondary | ICD-10-CM | POA: Diagnosis not present

## 2023-03-03 DIAGNOSIS — I495 Sick sinus syndrome: Secondary | ICD-10-CM | POA: Diagnosis not present

## 2023-03-03 DIAGNOSIS — K219 Gastro-esophageal reflux disease without esophagitis: Secondary | ICD-10-CM | POA: Diagnosis not present

## 2023-03-03 DIAGNOSIS — I484 Atypical atrial flutter: Secondary | ICD-10-CM | POA: Diagnosis not present

## 2023-03-03 DIAGNOSIS — I77810 Thoracic aortic ectasia: Secondary | ICD-10-CM | POA: Diagnosis not present

## 2023-03-05 DIAGNOSIS — M5416 Radiculopathy, lumbar region: Secondary | ICD-10-CM | POA: Diagnosis not present

## 2023-03-05 DIAGNOSIS — G4733 Obstructive sleep apnea (adult) (pediatric): Secondary | ICD-10-CM | POA: Diagnosis not present

## 2023-03-05 DIAGNOSIS — I080 Rheumatic disorders of both mitral and aortic valves: Secondary | ICD-10-CM | POA: Diagnosis not present

## 2023-03-05 DIAGNOSIS — N401 Enlarged prostate with lower urinary tract symptoms: Secondary | ICD-10-CM | POA: Diagnosis not present

## 2023-03-05 DIAGNOSIS — Z7901 Long term (current) use of anticoagulants: Secondary | ICD-10-CM | POA: Diagnosis not present

## 2023-03-05 DIAGNOSIS — E1140 Type 2 diabetes mellitus with diabetic neuropathy, unspecified: Secondary | ICD-10-CM | POA: Diagnosis not present

## 2023-03-05 DIAGNOSIS — Z8616 Personal history of COVID-19: Secondary | ICD-10-CM | POA: Diagnosis not present

## 2023-03-05 DIAGNOSIS — K219 Gastro-esophageal reflux disease without esophagitis: Secondary | ICD-10-CM | POA: Diagnosis not present

## 2023-03-05 DIAGNOSIS — I484 Atypical atrial flutter: Secondary | ICD-10-CM | POA: Diagnosis not present

## 2023-03-05 DIAGNOSIS — I77810 Thoracic aortic ectasia: Secondary | ICD-10-CM | POA: Diagnosis not present

## 2023-03-05 DIAGNOSIS — Z950 Presence of cardiac pacemaker: Secondary | ICD-10-CM | POA: Diagnosis not present

## 2023-03-05 DIAGNOSIS — F320 Major depressive disorder, single episode, mild: Secondary | ICD-10-CM | POA: Diagnosis not present

## 2023-03-05 DIAGNOSIS — I110 Hypertensive heart disease with heart failure: Secondary | ICD-10-CM | POA: Diagnosis not present

## 2023-03-05 DIAGNOSIS — I480 Paroxysmal atrial fibrillation: Secondary | ICD-10-CM | POA: Diagnosis not present

## 2023-03-05 DIAGNOSIS — I5043 Acute on chronic combined systolic (congestive) and diastolic (congestive) heart failure: Secondary | ICD-10-CM | POA: Diagnosis not present

## 2023-03-05 DIAGNOSIS — I495 Sick sinus syndrome: Secondary | ICD-10-CM | POA: Diagnosis not present

## 2023-03-05 DIAGNOSIS — Z7984 Long term (current) use of oral hypoglycemic drugs: Secondary | ICD-10-CM | POA: Diagnosis not present

## 2023-03-05 DIAGNOSIS — M5137 Other intervertebral disc degeneration, lumbosacral region: Secondary | ICD-10-CM | POA: Diagnosis not present

## 2023-03-05 DIAGNOSIS — E785 Hyperlipidemia, unspecified: Secondary | ICD-10-CM | POA: Diagnosis not present

## 2023-03-05 DIAGNOSIS — I252 Old myocardial infarction: Secondary | ICD-10-CM | POA: Diagnosis not present

## 2023-03-05 DIAGNOSIS — R350 Frequency of micturition: Secondary | ICD-10-CM | POA: Diagnosis not present

## 2023-03-05 DIAGNOSIS — I2510 Atherosclerotic heart disease of native coronary artery without angina pectoris: Secondary | ICD-10-CM | POA: Diagnosis not present

## 2023-03-05 DIAGNOSIS — E559 Vitamin D deficiency, unspecified: Secondary | ICD-10-CM | POA: Diagnosis not present

## 2023-03-05 DIAGNOSIS — Z9849 Cataract extraction status, unspecified eye: Secondary | ICD-10-CM | POA: Diagnosis not present

## 2023-03-05 DIAGNOSIS — D6859 Other primary thrombophilia: Secondary | ICD-10-CM | POA: Diagnosis not present

## 2023-03-05 NOTE — Telephone Encounter (Signed)
Jose Patel, will you follow up on this patient, Thank you.

## 2023-03-05 NOTE — Telephone Encounter (Signed)
Will forward to CMA in Clinical intake to follow up on.

## 2023-03-06 NOTE — Telephone Encounter (Signed)
I called and left message for Jose Patel to call the office when available.

## 2023-03-06 NOTE — Telephone Encounter (Signed)
Patient daughter Regino Schultze called back. I notified her of what provider [name], NP stated. She understood.

## 2023-03-07 ENCOUNTER — Other Ambulatory Visit: Payer: Self-pay | Admitting: Nurse Practitioner

## 2023-03-07 DIAGNOSIS — D6859 Other primary thrombophilia: Secondary | ICD-10-CM | POA: Diagnosis not present

## 2023-03-07 DIAGNOSIS — I480 Paroxysmal atrial fibrillation: Secondary | ICD-10-CM

## 2023-03-07 DIAGNOSIS — I77810 Thoracic aortic ectasia: Secondary | ICD-10-CM | POA: Diagnosis not present

## 2023-03-07 DIAGNOSIS — N401 Enlarged prostate with lower urinary tract symptoms: Secondary | ICD-10-CM | POA: Diagnosis not present

## 2023-03-07 DIAGNOSIS — R350 Frequency of micturition: Secondary | ICD-10-CM | POA: Diagnosis not present

## 2023-03-07 DIAGNOSIS — E1140 Type 2 diabetes mellitus with diabetic neuropathy, unspecified: Secondary | ICD-10-CM | POA: Diagnosis not present

## 2023-03-07 DIAGNOSIS — Z950 Presence of cardiac pacemaker: Secondary | ICD-10-CM | POA: Diagnosis not present

## 2023-03-07 DIAGNOSIS — I495 Sick sinus syndrome: Secondary | ICD-10-CM | POA: Diagnosis not present

## 2023-03-07 DIAGNOSIS — I252 Old myocardial infarction: Secondary | ICD-10-CM | POA: Diagnosis not present

## 2023-03-07 DIAGNOSIS — I110 Hypertensive heart disease with heart failure: Secondary | ICD-10-CM | POA: Diagnosis not present

## 2023-03-07 DIAGNOSIS — Z7901 Long term (current) use of anticoagulants: Secondary | ICD-10-CM | POA: Diagnosis not present

## 2023-03-07 DIAGNOSIS — M5137 Other intervertebral disc degeneration, lumbosacral region: Secondary | ICD-10-CM | POA: Diagnosis not present

## 2023-03-07 DIAGNOSIS — Z8616 Personal history of COVID-19: Secondary | ICD-10-CM | POA: Diagnosis not present

## 2023-03-07 DIAGNOSIS — F320 Major depressive disorder, single episode, mild: Secondary | ICD-10-CM | POA: Diagnosis not present

## 2023-03-07 DIAGNOSIS — I484 Atypical atrial flutter: Secondary | ICD-10-CM | POA: Diagnosis not present

## 2023-03-07 DIAGNOSIS — E785 Hyperlipidemia, unspecified: Secondary | ICD-10-CM | POA: Diagnosis not present

## 2023-03-07 DIAGNOSIS — I2510 Atherosclerotic heart disease of native coronary artery without angina pectoris: Secondary | ICD-10-CM | POA: Diagnosis not present

## 2023-03-07 DIAGNOSIS — I5043 Acute on chronic combined systolic (congestive) and diastolic (congestive) heart failure: Secondary | ICD-10-CM | POA: Diagnosis not present

## 2023-03-07 DIAGNOSIS — E559 Vitamin D deficiency, unspecified: Secondary | ICD-10-CM | POA: Diagnosis not present

## 2023-03-07 DIAGNOSIS — Z7984 Long term (current) use of oral hypoglycemic drugs: Secondary | ICD-10-CM | POA: Diagnosis not present

## 2023-03-07 DIAGNOSIS — K219 Gastro-esophageal reflux disease without esophagitis: Secondary | ICD-10-CM | POA: Diagnosis not present

## 2023-03-07 DIAGNOSIS — Z9849 Cataract extraction status, unspecified eye: Secondary | ICD-10-CM | POA: Diagnosis not present

## 2023-03-07 DIAGNOSIS — G4733 Obstructive sleep apnea (adult) (pediatric): Secondary | ICD-10-CM | POA: Diagnosis not present

## 2023-03-07 DIAGNOSIS — M5416 Radiculopathy, lumbar region: Secondary | ICD-10-CM | POA: Diagnosis not present

## 2023-03-07 DIAGNOSIS — I080 Rheumatic disorders of both mitral and aortic valves: Secondary | ICD-10-CM | POA: Diagnosis not present

## 2023-03-07 MED ORDER — RIVAROXABAN 20 MG PO TABS
ORAL_TABLET | ORAL | 1 refills | Status: DC
Start: 2023-03-07 — End: 2023-05-26

## 2023-03-12 DIAGNOSIS — Z950 Presence of cardiac pacemaker: Secondary | ICD-10-CM | POA: Diagnosis not present

## 2023-03-12 DIAGNOSIS — I484 Atypical atrial flutter: Secondary | ICD-10-CM | POA: Diagnosis not present

## 2023-03-12 DIAGNOSIS — Z7984 Long term (current) use of oral hypoglycemic drugs: Secondary | ICD-10-CM | POA: Diagnosis not present

## 2023-03-12 DIAGNOSIS — E785 Hyperlipidemia, unspecified: Secondary | ICD-10-CM | POA: Diagnosis not present

## 2023-03-12 DIAGNOSIS — I2510 Atherosclerotic heart disease of native coronary artery without angina pectoris: Secondary | ICD-10-CM | POA: Diagnosis not present

## 2023-03-12 DIAGNOSIS — I080 Rheumatic disorders of both mitral and aortic valves: Secondary | ICD-10-CM | POA: Diagnosis not present

## 2023-03-12 DIAGNOSIS — N401 Enlarged prostate with lower urinary tract symptoms: Secondary | ICD-10-CM | POA: Diagnosis not present

## 2023-03-12 DIAGNOSIS — D6859 Other primary thrombophilia: Secondary | ICD-10-CM | POA: Diagnosis not present

## 2023-03-12 DIAGNOSIS — Z8616 Personal history of COVID-19: Secondary | ICD-10-CM | POA: Diagnosis not present

## 2023-03-12 DIAGNOSIS — Z9849 Cataract extraction status, unspecified eye: Secondary | ICD-10-CM | POA: Diagnosis not present

## 2023-03-12 DIAGNOSIS — Z7901 Long term (current) use of anticoagulants: Secondary | ICD-10-CM | POA: Diagnosis not present

## 2023-03-12 DIAGNOSIS — I77810 Thoracic aortic ectasia: Secondary | ICD-10-CM | POA: Diagnosis not present

## 2023-03-12 DIAGNOSIS — I495 Sick sinus syndrome: Secondary | ICD-10-CM | POA: Diagnosis not present

## 2023-03-12 DIAGNOSIS — I110 Hypertensive heart disease with heart failure: Secondary | ICD-10-CM | POA: Diagnosis not present

## 2023-03-12 DIAGNOSIS — I480 Paroxysmal atrial fibrillation: Secondary | ICD-10-CM | POA: Diagnosis not present

## 2023-03-12 DIAGNOSIS — F320 Major depressive disorder, single episode, mild: Secondary | ICD-10-CM | POA: Diagnosis not present

## 2023-03-12 DIAGNOSIS — M5137 Other intervertebral disc degeneration, lumbosacral region: Secondary | ICD-10-CM | POA: Diagnosis not present

## 2023-03-12 DIAGNOSIS — I5043 Acute on chronic combined systolic (congestive) and diastolic (congestive) heart failure: Secondary | ICD-10-CM | POA: Diagnosis not present

## 2023-03-12 DIAGNOSIS — R350 Frequency of micturition: Secondary | ICD-10-CM | POA: Diagnosis not present

## 2023-03-12 DIAGNOSIS — E1140 Type 2 diabetes mellitus with diabetic neuropathy, unspecified: Secondary | ICD-10-CM | POA: Diagnosis not present

## 2023-03-12 DIAGNOSIS — E559 Vitamin D deficiency, unspecified: Secondary | ICD-10-CM | POA: Diagnosis not present

## 2023-03-12 DIAGNOSIS — M5416 Radiculopathy, lumbar region: Secondary | ICD-10-CM | POA: Diagnosis not present

## 2023-03-12 DIAGNOSIS — G4733 Obstructive sleep apnea (adult) (pediatric): Secondary | ICD-10-CM | POA: Diagnosis not present

## 2023-03-12 DIAGNOSIS — K219 Gastro-esophageal reflux disease without esophagitis: Secondary | ICD-10-CM | POA: Diagnosis not present

## 2023-03-12 DIAGNOSIS — I252 Old myocardial infarction: Secondary | ICD-10-CM | POA: Diagnosis not present

## 2023-03-14 DIAGNOSIS — R350 Frequency of micturition: Secondary | ICD-10-CM | POA: Diagnosis not present

## 2023-03-14 DIAGNOSIS — N401 Enlarged prostate with lower urinary tract symptoms: Secondary | ICD-10-CM | POA: Diagnosis not present

## 2023-03-14 DIAGNOSIS — I484 Atypical atrial flutter: Secondary | ICD-10-CM | POA: Diagnosis not present

## 2023-03-14 DIAGNOSIS — I495 Sick sinus syndrome: Secondary | ICD-10-CM | POA: Diagnosis not present

## 2023-03-14 DIAGNOSIS — Z950 Presence of cardiac pacemaker: Secondary | ICD-10-CM | POA: Diagnosis not present

## 2023-03-14 DIAGNOSIS — M5137 Other intervertebral disc degeneration, lumbosacral region: Secondary | ICD-10-CM | POA: Diagnosis not present

## 2023-03-14 DIAGNOSIS — G4733 Obstructive sleep apnea (adult) (pediatric): Secondary | ICD-10-CM | POA: Diagnosis not present

## 2023-03-14 DIAGNOSIS — I110 Hypertensive heart disease with heart failure: Secondary | ICD-10-CM | POA: Diagnosis not present

## 2023-03-14 DIAGNOSIS — I480 Paroxysmal atrial fibrillation: Secondary | ICD-10-CM | POA: Diagnosis not present

## 2023-03-14 DIAGNOSIS — E785 Hyperlipidemia, unspecified: Secondary | ICD-10-CM | POA: Diagnosis not present

## 2023-03-14 DIAGNOSIS — I080 Rheumatic disorders of both mitral and aortic valves: Secondary | ICD-10-CM | POA: Diagnosis not present

## 2023-03-14 DIAGNOSIS — Z8616 Personal history of COVID-19: Secondary | ICD-10-CM | POA: Diagnosis not present

## 2023-03-14 DIAGNOSIS — F320 Major depressive disorder, single episode, mild: Secondary | ICD-10-CM | POA: Diagnosis not present

## 2023-03-14 DIAGNOSIS — M5416 Radiculopathy, lumbar region: Secondary | ICD-10-CM | POA: Diagnosis not present

## 2023-03-14 DIAGNOSIS — E559 Vitamin D deficiency, unspecified: Secondary | ICD-10-CM | POA: Diagnosis not present

## 2023-03-14 DIAGNOSIS — Z9849 Cataract extraction status, unspecified eye: Secondary | ICD-10-CM | POA: Diagnosis not present

## 2023-03-14 DIAGNOSIS — K219 Gastro-esophageal reflux disease without esophagitis: Secondary | ICD-10-CM | POA: Diagnosis not present

## 2023-03-14 DIAGNOSIS — Z7901 Long term (current) use of anticoagulants: Secondary | ICD-10-CM | POA: Diagnosis not present

## 2023-03-14 DIAGNOSIS — D6859 Other primary thrombophilia: Secondary | ICD-10-CM | POA: Diagnosis not present

## 2023-03-14 DIAGNOSIS — E1140 Type 2 diabetes mellitus with diabetic neuropathy, unspecified: Secondary | ICD-10-CM | POA: Diagnosis not present

## 2023-03-14 DIAGNOSIS — I2510 Atherosclerotic heart disease of native coronary artery without angina pectoris: Secondary | ICD-10-CM | POA: Diagnosis not present

## 2023-03-14 DIAGNOSIS — I252 Old myocardial infarction: Secondary | ICD-10-CM | POA: Diagnosis not present

## 2023-03-14 DIAGNOSIS — I5043 Acute on chronic combined systolic (congestive) and diastolic (congestive) heart failure: Secondary | ICD-10-CM | POA: Diagnosis not present

## 2023-03-14 DIAGNOSIS — I77810 Thoracic aortic ectasia: Secondary | ICD-10-CM | POA: Diagnosis not present

## 2023-03-14 DIAGNOSIS — Z7984 Long term (current) use of oral hypoglycemic drugs: Secondary | ICD-10-CM | POA: Diagnosis not present

## 2023-03-19 ENCOUNTER — Other Ambulatory Visit: Payer: Self-pay

## 2023-03-19 ENCOUNTER — Encounter (HOSPITAL_BASED_OUTPATIENT_CLINIC_OR_DEPARTMENT_OTHER): Payer: Self-pay

## 2023-03-19 ENCOUNTER — Emergency Department (HOSPITAL_BASED_OUTPATIENT_CLINIC_OR_DEPARTMENT_OTHER)
Admission: EM | Admit: 2023-03-19 | Discharge: 2023-03-20 | Disposition: A | Discharge status: Home / Self Care | Payer: Medicare HMO | Attending: Emergency Medicine | Admitting: Emergency Medicine

## 2023-03-19 ENCOUNTER — Emergency Department (HOSPITAL_BASED_OUTPATIENT_CLINIC_OR_DEPARTMENT_OTHER): Payer: Medicare HMO

## 2023-03-19 DIAGNOSIS — G4733 Obstructive sleep apnea (adult) (pediatric): Secondary | ICD-10-CM | POA: Diagnosis not present

## 2023-03-19 DIAGNOSIS — Z79899 Other long term (current) drug therapy: Secondary | ICD-10-CM | POA: Insufficient documentation

## 2023-03-19 DIAGNOSIS — M5416 Radiculopathy, lumbar region: Secondary | ICD-10-CM | POA: Diagnosis not present

## 2023-03-19 DIAGNOSIS — I495 Sick sinus syndrome: Secondary | ICD-10-CM | POA: Diagnosis not present

## 2023-03-19 DIAGNOSIS — I110 Hypertensive heart disease with heart failure: Secondary | ICD-10-CM | POA: Diagnosis not present

## 2023-03-19 DIAGNOSIS — R072 Precordial pain: Secondary | ICD-10-CM | POA: Diagnosis not present

## 2023-03-19 DIAGNOSIS — I080 Rheumatic disorders of both mitral and aortic valves: Secondary | ICD-10-CM | POA: Diagnosis not present

## 2023-03-19 DIAGNOSIS — R142 Eructation: Secondary | ICD-10-CM | POA: Diagnosis not present

## 2023-03-19 DIAGNOSIS — I509 Heart failure, unspecified: Secondary | ICD-10-CM | POA: Insufficient documentation

## 2023-03-19 DIAGNOSIS — Z7984 Long term (current) use of oral hypoglycemic drugs: Secondary | ICD-10-CM | POA: Diagnosis not present

## 2023-03-19 DIAGNOSIS — R350 Frequency of micturition: Secondary | ICD-10-CM | POA: Diagnosis not present

## 2023-03-19 DIAGNOSIS — E119 Type 2 diabetes mellitus without complications: Secondary | ICD-10-CM | POA: Diagnosis not present

## 2023-03-19 DIAGNOSIS — Z8616 Personal history of COVID-19: Secondary | ICD-10-CM | POA: Insufficient documentation

## 2023-03-19 DIAGNOSIS — R079 Chest pain, unspecified: Secondary | ICD-10-CM | POA: Diagnosis not present

## 2023-03-19 DIAGNOSIS — I480 Paroxysmal atrial fibrillation: Secondary | ICD-10-CM | POA: Diagnosis not present

## 2023-03-19 DIAGNOSIS — N401 Enlarged prostate with lower urinary tract symptoms: Secondary | ICD-10-CM | POA: Diagnosis not present

## 2023-03-19 DIAGNOSIS — I2510 Atherosclerotic heart disease of native coronary artery without angina pectoris: Secondary | ICD-10-CM | POA: Diagnosis not present

## 2023-03-19 DIAGNOSIS — M79602 Pain in left arm: Secondary | ICD-10-CM | POA: Diagnosis not present

## 2023-03-19 DIAGNOSIS — Z7901 Long term (current) use of anticoagulants: Secondary | ICD-10-CM | POA: Insufficient documentation

## 2023-03-19 DIAGNOSIS — M5137 Other intervertebral disc degeneration, lumbosacral region: Secondary | ICD-10-CM | POA: Diagnosis not present

## 2023-03-19 DIAGNOSIS — Z9849 Cataract extraction status, unspecified eye: Secondary | ICD-10-CM | POA: Diagnosis not present

## 2023-03-19 DIAGNOSIS — Z950 Presence of cardiac pacemaker: Secondary | ICD-10-CM | POA: Diagnosis not present

## 2023-03-19 DIAGNOSIS — K219 Gastro-esophageal reflux disease without esophagitis: Secondary | ICD-10-CM | POA: Diagnosis not present

## 2023-03-19 DIAGNOSIS — I700 Atherosclerosis of aorta: Secondary | ICD-10-CM | POA: Diagnosis not present

## 2023-03-19 DIAGNOSIS — E785 Hyperlipidemia, unspecified: Secondary | ICD-10-CM | POA: Diagnosis not present

## 2023-03-19 DIAGNOSIS — I252 Old myocardial infarction: Secondary | ICD-10-CM | POA: Diagnosis not present

## 2023-03-19 DIAGNOSIS — I77810 Thoracic aortic ectasia: Secondary | ICD-10-CM | POA: Diagnosis not present

## 2023-03-19 DIAGNOSIS — E559 Vitamin D deficiency, unspecified: Secondary | ICD-10-CM | POA: Diagnosis not present

## 2023-03-19 DIAGNOSIS — R0789 Other chest pain: Secondary | ICD-10-CM | POA: Diagnosis not present

## 2023-03-19 DIAGNOSIS — E1140 Type 2 diabetes mellitus with diabetic neuropathy, unspecified: Secondary | ICD-10-CM | POA: Diagnosis not present

## 2023-03-19 DIAGNOSIS — I484 Atypical atrial flutter: Secondary | ICD-10-CM | POA: Diagnosis not present

## 2023-03-19 DIAGNOSIS — D6859 Other primary thrombophilia: Secondary | ICD-10-CM | POA: Diagnosis not present

## 2023-03-19 DIAGNOSIS — I5043 Acute on chronic combined systolic (congestive) and diastolic (congestive) heart failure: Secondary | ICD-10-CM | POA: Diagnosis not present

## 2023-03-19 DIAGNOSIS — F320 Major depressive disorder, single episode, mild: Secondary | ICD-10-CM | POA: Diagnosis not present

## 2023-03-19 NOTE — ED Triage Notes (Signed)
Pt reports an episode of chest pain around 35 minutes ago. Pt denies current CP. "A little bit" of SHOB.

## 2023-03-20 ENCOUNTER — Telehealth: Payer: Self-pay | Admitting: Cardiology

## 2023-03-20 LAB — CBC
HCT: 40.5 % (ref 39.0–52.0)
Hemoglobin: 13.3 g/dL (ref 13.0–17.0)
MCH: 31.8 pg (ref 26.0–34.0)
MCHC: 32.8 g/dL (ref 30.0–36.0)
MCV: 96.9 fL (ref 80.0–100.0)
Platelets: 217 10*3/uL (ref 150–400)
RBC: 4.18 MIL/uL — ABNORMAL LOW (ref 4.22–5.81)
RDW: 16.2 % — ABNORMAL HIGH (ref 11.5–15.5)
WBC: 10 10*3/uL (ref 4.0–10.5)
nRBC: 0 % (ref 0.0–0.2)

## 2023-03-20 LAB — BRAIN NATRIURETIC PEPTIDE: B Natriuretic Peptide: 1102.4 pg/mL — ABNORMAL HIGH (ref 0.0–100.0)

## 2023-03-20 LAB — BASIC METABOLIC PANEL
Anion gap: 9 (ref 5–15)
BUN: 33 mg/dL — ABNORMAL HIGH (ref 8–23)
CO2: 25 mmol/L (ref 22–32)
Calcium: 8.1 mg/dL — ABNORMAL LOW (ref 8.9–10.3)
Chloride: 105 mmol/L (ref 98–111)
Creatinine, Ser: 1.29 mg/dL — ABNORMAL HIGH (ref 0.61–1.24)
GFR, Estimated: 54 mL/min — ABNORMAL LOW (ref >60)
Glucose, Bld: 143 mg/dL — ABNORMAL HIGH (ref 70–99)
Potassium: 4 mmol/L (ref 3.5–5.1)
Sodium: 139 mmol/L (ref 135–145)

## 2023-03-20 LAB — TROPONIN I (HIGH SENSITIVITY)
Troponin I (High Sensitivity): 66 ng/L — ABNORMAL HIGH (ref <18)
Troponin I (High Sensitivity): 62 ng/L — ABNORMAL HIGH (ref <18)

## 2023-03-20 MED ORDER — OMEPRAZOLE 20 MG PO CPDR: 20.0000 mg | DELAYED_RELEASE_CAPSULE | Freq: Every day | ORAL | 0 refills | Status: DC

## 2023-03-20 MED ORDER — ALUM & MAG HYDROXIDE-SIMETH 200-200-20 MG/5ML PO SUSP
30.0000 mL | Freq: Once | ORAL | Status: AC
  Administered 2023-03-20: 30 mL via ORAL
  Filled 2023-03-20: qty 30

## 2023-03-20 MED ORDER — KETOROLAC TROMETHAMINE 30 MG/ML IJ SOLN
15.0000 mg | Freq: Once | INTRAMUSCULAR | Status: AC
  Administered 2023-03-20: 15 mg via INTRAVENOUS
  Filled 2023-03-20: qty 1

## 2023-03-20 MED ORDER — ONDANSETRON 4 MG PO TBDP
8.0000 mg | ORAL_TABLET | Freq: Once | ORAL | Status: AC
  Administered 2023-03-20: 8 mg via ORAL
  Filled 2023-03-20: qty 2

## 2023-03-20 NOTE — Telephone Encounter (Signed)
Patient seen in the ER last night for atypical chest pain thought more related to GI. Please reach to patient to schedule follow up appointment.

## 2023-03-20 NOTE — ED Provider Notes (Signed)
[place] [facility] AT [facility] Provider Note   CSN: [id number] Arrival date & time: 03/19/23  2320     History  Chief Complaint  Patient presents with   Chest Pain    Jose Patel is a 89 y.o. male.   Chest Pain Pain location:  Substernal area and epigastric Pain quality: dull   Pain radiates to:  Does not radiate Pain severity:  Moderate Onset quality:  Sudden Timing:  Constant Progression:  Resolved Context: at rest   Relieved by:  Nothing Worsened by:  Nothing Associated symptoms: nausea   Associated symptoms: no abdominal pain and no fever   Associated symptoms comment:  Belching and has had some nausea  Risk factors: male sex   Risk factors: not obese, no prior DVT/PE and no surgery   Patient is a pleasant 89 year old gentleman with a h/o CHF, AFIB and GERD who presents with recent episodes of nausea and belching and then this evening had an approximately 35 minute episode of pain at rest.      Past Medical History:  Diagnosis Date   A-fib (HCC)    Anticoagulated on Coumadin    Per records from [facility]    Aortic regurgitation    Aortic stenosis    CHF (congestive heart failure) (HCC)    Coronary artery disease    COVID    CVA (cerebral vascular accident) (HCC)    hx of CVA noted on CT from 02/19/18   Diabetes mellitus, type 2 (HCC)    Gastro-esophageal reflux disease without esophagitis    Per records from previous provider, [facility]   Heart attack [facility])    History of CT scan of head 02/19/2018   Per records from previous provider, [facility]. Chronic changes small vessel disease   History of ECG    03/26/14- Sinus Tachycardia @ 107 bmp, QRS 78 msec, QT 298 msec, QTc 361 msec. 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 [facility])    Per records from previous provider, [facility]    MI (myocardial infarction) [facility])    Per records from [facility]    Mitral regurgitation    Mitral stenosis    Other intervertebral disc degeneration, lumbar region    Per records from previous provider, [facility]   Radiculopathy, lumbar region    Per records from previous provider, [facility]   Sinus bradycardia    Per records from [facility]    Sleep apnea    Transient ischemic attack    10 to 12 years ago   Typical atrial flutter [facility])    Per records from [facility]      Home Medications Prior to Admission medications   Medication Sig Start Date End Date Taking? Authorizing Provider  omeprazole (PRILOSEC) 20 MG capsule Take 1 capsule (20 mg total) by mouth daily. 03/20/23  Yes Venicia Vandall, MD  amiodarone (PACERONE) 100 MG tablet Take 1 tablet (100 mg total) by mouth daily. 02/21/23   Croitoru, Mihai, MD  Cholecalciferol (VITAMIN D) 125 MCG (5000 UT) CAPS Take 5,000 Units by mouth daily at 6 (six) AM. 11/12/22   [provider]  empagliflozin (JARDIANCE) 10 MG  TABS tablet Take 1 tablet (10 mg total) by mouth daily before breakfast. 09/25/22   Croitoru, Mihai, MD  memantine (NAMENDA) 10 MG tablet TAKE ONE TABLET BY MOUTH TWICE DAILY 04/04/22   [name], NP  metoprolol succinate (TOPROL-XL) 50 MG 24 hr tablet TAKE ONE TABLET BY MOUTH ONE TIME DAILY 04/06/22   Croitoru, Rachelle Hora, MD  potassium chloride (KLOR-CON) 10 MEQ tablet Take 1 tablet (10 mEq total) by mouth every Monday, Wednesday, and Friday. 02/02/23   Croitoru, Mihai, MD  rivaroxaban (XARELTO) 20 MG TABS tablet TAKE ONE TABLET BY MOUTH ONE TIME DAILY WITH SUPPER 03/07/23   [name], NP  sacubitril-valsartan (ENTRESTO) 49-51 MG Take 1 tablet by mouth 2 (two) times daily. 09/26/22   Croitoru, Mihai, MD   spironolactone (ALDACTONE) 25 MG tablet Take 0.5 tablets (12.5 mg total) by mouth daily. 02/21/23   Croitoru, Mihai, MD  tamsulosin (FLOMAX) 0.4 MG CAPS capsule Take 1 capsule (0.4 mg total) by mouth daily. 02/21/23   [name], NP  torsemide (DEMADEX) 10 MG tablet Take 10 mg every Monday and Thursday in the morning 02/21/23   Croitoru, Mihai, MD      Allergies    Aricept [donepezil], Tropicamide, and Adhesive [tape]    Review of Systems   Review of Systems  Constitutional:  Negative for fever.  Respiratory:  Negative for wheezing and stridor.   Cardiovascular:  Positive for chest pain. Negative for leg swelling.  Gastrointestinal:  Positive for nausea. Negative for abdominal pain.       Belching   All other systems reviewed and are negative.   Physical Exam Updated Vital Signs BP (!) 130/49   Pulse 60   Temp 97.8 F (36.6 C)   Resp 17   Ht 5\' 10"  (1.778 m)   Wt 66.7 kg   SpO2 99%   BMI 21.09 kg/m  Physical Exam Vitals and nursing note reviewed.  Constitutional:      General: He is not in acute distress.    Appearance: Normal appearance. He is well-developed. He is not diaphoretic.  HENT:     Head: Normocephalic and atraumatic.     Nose: Nose normal.  Eyes:     Conjunctiva/sclera: Conjunctivae normal.     Pupils: Pupils are equal, round, and reactive to light.  Cardiovascular:     Rate and Rhythm: Normal rate and regular rhythm.     Pulses: Normal pulses.     Heart sounds: Normal heart sounds.  Pulmonary:     Effort: Pulmonary effort is normal. No respiratory distress.     Breath sounds: Normal breath sounds. No wheezing, rhonchi or rales.  Abdominal:     General: Abdomen is flat. Bowel sounds are normal.     Palpations: Abdomen is soft.     Tenderness: There is no abdominal tenderness. There is no guarding or rebound.  Musculoskeletal:        General: Normal range of motion.     Cervical back: Normal range of motion and neck supple.     Right lower leg:  No edema.     Left lower leg: No edema.  Skin:    General: Skin is warm and dry.     Capillary Refill: Capillary refill takes less than 2 seconds.  Neurological:     General: No focal deficit present.     Mental Status: He is alert and oriented to person, place, and time.     Deep Tendon Reflexes: Reflexes normal.  Psychiatric:        Mood and Affect: Mood normal.        Thought Content: Thought content normal.     ED Results / Procedures / Treatments   Labs (all labs ordered are listed, but only abnormal results are displayed) Results for orders placed or performed during the hospital encounter of 03/19/23  Basic metabolic panel  Result Value Ref Range   Sodium 139 135 - 145 mmol/L   Potassium 4.0 3.5 - 5.1 mmol/L   Chloride 105 98 - 111 mmol/L   CO2 25 22 - 32 mmol/L   Glucose, Bld 143 (H) 70 - 99 mg/dL   BUN 33 (H) 8 - 23 mg/dL   Creatinine, Ser 2.44 (H) 0.61 - 1.24 mg/dL   Calcium 8.1 (L) 8.9 - 10.3 mg/dL   GFR, Estimated 54 (L) >60 mL/min   Anion gap 9 5 - 15  CBC  Result Value Ref Range   WBC 10.0 4.0 - 10.5 K/uL   RBC 4.18 (L) 4.22 - 5.81 MIL/uL   Hemoglobin 13.3 13.0 - 17.0 g/dL   HCT 01.0 27.2 - 53.6 %   MCV 96.9 80.0 - 100.0 fL   MCH 31.8 26.0 - 34.0 pg   MCHC 32.8 30.0 - 36.0 g/dL   RDW 64.4 (H) 03.4 - 74.2 %   Platelets 217 150 - 400 K/uL   nRBC 0.0 0.0 - 0.2 %  Brain natriuretic peptide  Result Value Ref Range   B Natriuretic Peptide 1,102.4 (H) 0.0 - 100.0 pg/mL  Troponin I (High Sensitivity)  Result Value Ref Range   Troponin I (High Sensitivity) 66 (H) <18 ng/L  Troponin I (High Sensitivity)  Result Value Ref Range   Troponin I (High Sensitivity) 62 (H) <18 ng/L   DG Chest 2 View  Result Date: 03/20/2023 CLINICAL DATA:  Left arm pain and burping.  Chest pain. EXAM: CHEST - 2 VIEW COMPARISON:  Radiograph 03/24/2021 FINDINGS: Stable cardiomediastinal silhouette. Left chest wall pacemaker. Aortic atherosclerotic calcification. No focal  consolidation, pleural effusion, or pneumothorax. No displaced rib fractures. IMPRESSION: No active cardiopulmonary disease. Electronically Signed   By: Minerva Fester M.D.   On: 03/20/2023 00:18    EKG Atrial paced rhythm HR 64    Radiology DG Chest 2 View  Result Date: 03/20/2023 CLINICAL DATA:  Left arm pain and burping.  Chest pain. EXAM: CHEST - 2 VIEW COMPARISON:  Radiograph 03/24/2021 FINDINGS: Stable cardiomediastinal silhouette. Left chest wall pacemaker. Aortic atherosclerotic calcification. No focal consolidation, pleural effusion, or pneumothorax. No displaced rib fractures. IMPRESSION: No active cardiopulmonary disease. Electronically Signed   By: Minerva Fester M.D.   On: 03/20/2023 00:18    Procedures Procedures    Medications Ordered in ED Medications  ondansetron (ZOFRAN-ODT) disintegrating tablet 8 mg (8 mg Oral Given 03/20/23 0129)  alum & mag hydroxide-simeth (MAALOX/MYLANTA) 200-200-20 MG/5ML suspension 30 mL (30 mLs Oral Given 03/20/23 0128)  ketorolac (TORADOL) 30 MG/ML injection 15 mg (15 mg Intravenous Given 03/20/23 0136)    ED Course/ Medical Decision Making/ A&P                                 Medical Decision Making Patient with pain this evening but has been having episodes of nausea and belching   Amount and/or Complexity of Data Reviewed Independent Historian:     Details: Daughter see above  External Data Reviewed: labs and notes.  Details: Previous outpatient notes and labs reviewed  Labs: ordered.    Details: Normal sodium 139, normal potassium 4, normal creatinine.  First troponin 66, second troponin 62.  Normal white count 10, normal hemoglobin 13, normal platelet count 217 Radiology: ordered and independent interpretation performed.    Details: No CHF on CXR by me  Discussion of management or test interpretation with external provider(s): Case d/w Dr. Rosalio Macadamia of cardiology.  Add BNP.  We spoke following the BNP, no indication for medication  adjustment to diuretic at this time.  Will have office call for follow up.  Does not need admission at this time   Risk OTC drugs. Prescription drug management. Risk Details: Patient's troponins have been elevated at many previous visits.  They are better today than usual.  The delta troponin was less than the initial troponin.  No CHF on CXR and lungs are clear with 100% oxygen saturation on my exam.  I do not believe this is a PE and patient is anticoagulated on a DOAC.  I suspect the nausea and belching are GERD and patient has a history of this but is not on PPI therapy.  I will start patient on PPI and have patient follow up with cardiology as an outpatient.       Final Clinical Impression(s) / ED Diagnoses Final diagnoses:  Belching   Return for intractable cough, coughing up blood, fevers > 100.4 unrelieved by medication, shortness of breath, intractable vomiting, chest pain, shortness of breath, weakness, numbness, changes in speech, facial asymmetry, abdominal pain, passing out, Inability to tolerate liquids or food, cough, altered mental status or any concerns. No signs of systemic illness or infection. The patient is nontoxic-appearing on exam and vital signs are within normal limits.  I have reviewed the triage vital signs and the nursing notes. Pertinent labs & imaging results that were available during my care of the patient were reviewed by me and considered in my medical decision making (see chart for details). After history, exam, and medical workup I feel the patient has been appropriately medically screened and is safe for discharge home. Pertinent diagnoses were discussed with the patient. Patient was given return precautions.  Rx / DC Orders ED Discharge Orders          Ordered    omeprazole (PRILOSEC) 20 MG capsule  Daily        03/20/23 0411              Kassondra Geil, MD 03/20/23 6578

## 2023-03-21 DIAGNOSIS — I77810 Thoracic aortic ectasia: Secondary | ICD-10-CM | POA: Diagnosis not present

## 2023-03-21 DIAGNOSIS — I495 Sick sinus syndrome: Secondary | ICD-10-CM | POA: Diagnosis not present

## 2023-03-21 DIAGNOSIS — I110 Hypertensive heart disease with heart failure: Secondary | ICD-10-CM | POA: Diagnosis not present

## 2023-03-21 DIAGNOSIS — M5416 Radiculopathy, lumbar region: Secondary | ICD-10-CM | POA: Diagnosis not present

## 2023-03-21 DIAGNOSIS — E559 Vitamin D deficiency, unspecified: Secondary | ICD-10-CM | POA: Diagnosis not present

## 2023-03-21 DIAGNOSIS — Z7901 Long term (current) use of anticoagulants: Secondary | ICD-10-CM | POA: Diagnosis not present

## 2023-03-21 DIAGNOSIS — Z950 Presence of cardiac pacemaker: Secondary | ICD-10-CM | POA: Diagnosis not present

## 2023-03-21 DIAGNOSIS — K219 Gastro-esophageal reflux disease without esophagitis: Secondary | ICD-10-CM | POA: Diagnosis not present

## 2023-03-21 DIAGNOSIS — I5043 Acute on chronic combined systolic (congestive) and diastolic (congestive) heart failure: Secondary | ICD-10-CM | POA: Diagnosis not present

## 2023-03-21 DIAGNOSIS — M5137 Other intervertebral disc degeneration, lumbosacral region: Secondary | ICD-10-CM | POA: Diagnosis not present

## 2023-03-21 DIAGNOSIS — R350 Frequency of micturition: Secondary | ICD-10-CM | POA: Diagnosis not present

## 2023-03-21 DIAGNOSIS — D6859 Other primary thrombophilia: Secondary | ICD-10-CM | POA: Diagnosis not present

## 2023-03-21 DIAGNOSIS — G4733 Obstructive sleep apnea (adult) (pediatric): Secondary | ICD-10-CM | POA: Diagnosis not present

## 2023-03-21 DIAGNOSIS — E1140 Type 2 diabetes mellitus with diabetic neuropathy, unspecified: Secondary | ICD-10-CM | POA: Diagnosis not present

## 2023-03-21 DIAGNOSIS — I252 Old myocardial infarction: Secondary | ICD-10-CM | POA: Diagnosis not present

## 2023-03-21 DIAGNOSIS — I480 Paroxysmal atrial fibrillation: Secondary | ICD-10-CM | POA: Diagnosis not present

## 2023-03-21 DIAGNOSIS — Z9849 Cataract extraction status, unspecified eye: Secondary | ICD-10-CM | POA: Diagnosis not present

## 2023-03-21 DIAGNOSIS — I2510 Atherosclerotic heart disease of native coronary artery without angina pectoris: Secondary | ICD-10-CM | POA: Diagnosis not present

## 2023-03-21 DIAGNOSIS — E785 Hyperlipidemia, unspecified: Secondary | ICD-10-CM | POA: Diagnosis not present

## 2023-03-21 DIAGNOSIS — Z7984 Long term (current) use of oral hypoglycemic drugs: Secondary | ICD-10-CM | POA: Diagnosis not present

## 2023-03-21 DIAGNOSIS — F320 Major depressive disorder, single episode, mild: Secondary | ICD-10-CM | POA: Diagnosis not present

## 2023-03-21 DIAGNOSIS — N401 Enlarged prostate with lower urinary tract symptoms: Secondary | ICD-10-CM | POA: Diagnosis not present

## 2023-03-21 DIAGNOSIS — I080 Rheumatic disorders of both mitral and aortic valves: Secondary | ICD-10-CM | POA: Diagnosis not present

## 2023-03-21 DIAGNOSIS — Z8616 Personal history of COVID-19: Secondary | ICD-10-CM | POA: Diagnosis not present

## 2023-03-21 DIAGNOSIS — I484 Atypical atrial flutter: Secondary | ICD-10-CM | POA: Diagnosis not present

## 2023-03-23 DIAGNOSIS — D6859 Other primary thrombophilia: Secondary | ICD-10-CM | POA: Diagnosis not present

## 2023-03-23 DIAGNOSIS — F320 Major depressive disorder, single episode, mild: Secondary | ICD-10-CM | POA: Diagnosis not present

## 2023-03-23 DIAGNOSIS — E785 Hyperlipidemia, unspecified: Secondary | ICD-10-CM | POA: Diagnosis not present

## 2023-03-23 DIAGNOSIS — I252 Old myocardial infarction: Secondary | ICD-10-CM | POA: Diagnosis not present

## 2023-03-23 DIAGNOSIS — Z7901 Long term (current) use of anticoagulants: Secondary | ICD-10-CM | POA: Diagnosis not present

## 2023-03-23 DIAGNOSIS — Z950 Presence of cardiac pacemaker: Secondary | ICD-10-CM | POA: Diagnosis not present

## 2023-03-23 DIAGNOSIS — M5416 Radiculopathy, lumbar region: Secondary | ICD-10-CM | POA: Diagnosis not present

## 2023-03-23 DIAGNOSIS — Z7984 Long term (current) use of oral hypoglycemic drugs: Secondary | ICD-10-CM | POA: Diagnosis not present

## 2023-03-23 DIAGNOSIS — K219 Gastro-esophageal reflux disease without esophagitis: Secondary | ICD-10-CM | POA: Diagnosis not present

## 2023-03-23 DIAGNOSIS — G4733 Obstructive sleep apnea (adult) (pediatric): Secondary | ICD-10-CM | POA: Diagnosis not present

## 2023-03-23 DIAGNOSIS — Z8616 Personal history of COVID-19: Secondary | ICD-10-CM | POA: Diagnosis not present

## 2023-03-23 DIAGNOSIS — I5043 Acute on chronic combined systolic (congestive) and diastolic (congestive) heart failure: Secondary | ICD-10-CM | POA: Diagnosis not present

## 2023-03-23 DIAGNOSIS — I2510 Atherosclerotic heart disease of native coronary artery without angina pectoris: Secondary | ICD-10-CM | POA: Diagnosis not present

## 2023-03-23 DIAGNOSIS — E1140 Type 2 diabetes mellitus with diabetic neuropathy, unspecified: Secondary | ICD-10-CM | POA: Diagnosis not present

## 2023-03-23 DIAGNOSIS — I495 Sick sinus syndrome: Secondary | ICD-10-CM | POA: Diagnosis not present

## 2023-03-23 DIAGNOSIS — I110 Hypertensive heart disease with heart failure: Secondary | ICD-10-CM | POA: Diagnosis not present

## 2023-03-23 DIAGNOSIS — M5137 Other intervertebral disc degeneration, lumbosacral region: Secondary | ICD-10-CM | POA: Diagnosis not present

## 2023-03-23 DIAGNOSIS — R350 Frequency of micturition: Secondary | ICD-10-CM | POA: Diagnosis not present

## 2023-03-23 DIAGNOSIS — E559 Vitamin D deficiency, unspecified: Secondary | ICD-10-CM | POA: Diagnosis not present

## 2023-03-23 DIAGNOSIS — I77810 Thoracic aortic ectasia: Secondary | ICD-10-CM | POA: Diagnosis not present

## 2023-03-23 DIAGNOSIS — I480 Paroxysmal atrial fibrillation: Secondary | ICD-10-CM | POA: Diagnosis not present

## 2023-03-23 DIAGNOSIS — Z9849 Cataract extraction status, unspecified eye: Secondary | ICD-10-CM | POA: Diagnosis not present

## 2023-03-23 DIAGNOSIS — I484 Atypical atrial flutter: Secondary | ICD-10-CM | POA: Diagnosis not present

## 2023-03-23 DIAGNOSIS — I080 Rheumatic disorders of both mitral and aortic valves: Secondary | ICD-10-CM | POA: Diagnosis not present

## 2023-03-23 DIAGNOSIS — N401 Enlarged prostate with lower urinary tract symptoms: Secondary | ICD-10-CM | POA: Diagnosis not present

## 2023-03-23 NOTE — Telephone Encounter (Signed)
I recently saw him in clinic. APP is fine. Thank you

## 2023-03-26 DIAGNOSIS — M5137 Other intervertebral disc degeneration, lumbosacral region: Secondary | ICD-10-CM | POA: Diagnosis not present

## 2023-03-26 DIAGNOSIS — I110 Hypertensive heart disease with heart failure: Secondary | ICD-10-CM | POA: Diagnosis not present

## 2023-03-26 DIAGNOSIS — Z8616 Personal history of COVID-19: Secondary | ICD-10-CM | POA: Diagnosis not present

## 2023-03-26 DIAGNOSIS — N401 Enlarged prostate with lower urinary tract symptoms: Secondary | ICD-10-CM | POA: Diagnosis not present

## 2023-03-26 DIAGNOSIS — I080 Rheumatic disorders of both mitral and aortic valves: Secondary | ICD-10-CM | POA: Diagnosis not present

## 2023-03-26 DIAGNOSIS — I480 Paroxysmal atrial fibrillation: Secondary | ICD-10-CM | POA: Diagnosis not present

## 2023-03-26 DIAGNOSIS — Z950 Presence of cardiac pacemaker: Secondary | ICD-10-CM | POA: Diagnosis not present

## 2023-03-26 DIAGNOSIS — I2510 Atherosclerotic heart disease of native coronary artery without angina pectoris: Secondary | ICD-10-CM | POA: Diagnosis not present

## 2023-03-26 DIAGNOSIS — G4733 Obstructive sleep apnea (adult) (pediatric): Secondary | ICD-10-CM | POA: Diagnosis not present

## 2023-03-26 DIAGNOSIS — M5416 Radiculopathy, lumbar region: Secondary | ICD-10-CM | POA: Diagnosis not present

## 2023-03-26 DIAGNOSIS — K219 Gastro-esophageal reflux disease without esophagitis: Secondary | ICD-10-CM | POA: Diagnosis not present

## 2023-03-26 DIAGNOSIS — R350 Frequency of micturition: Secondary | ICD-10-CM | POA: Diagnosis not present

## 2023-03-26 DIAGNOSIS — E1140 Type 2 diabetes mellitus with diabetic neuropathy, unspecified: Secondary | ICD-10-CM | POA: Diagnosis not present

## 2023-03-26 DIAGNOSIS — D6859 Other primary thrombophilia: Secondary | ICD-10-CM | POA: Diagnosis not present

## 2023-03-26 DIAGNOSIS — I5043 Acute on chronic combined systolic (congestive) and diastolic (congestive) heart failure: Secondary | ICD-10-CM | POA: Diagnosis not present

## 2023-03-26 DIAGNOSIS — Z7984 Long term (current) use of oral hypoglycemic drugs: Secondary | ICD-10-CM | POA: Diagnosis not present

## 2023-03-26 DIAGNOSIS — Z9849 Cataract extraction status, unspecified eye: Secondary | ICD-10-CM | POA: Diagnosis not present

## 2023-03-26 DIAGNOSIS — I495 Sick sinus syndrome: Secondary | ICD-10-CM | POA: Diagnosis not present

## 2023-03-26 DIAGNOSIS — I77810 Thoracic aortic ectasia: Secondary | ICD-10-CM | POA: Diagnosis not present

## 2023-03-26 DIAGNOSIS — Z7901 Long term (current) use of anticoagulants: Secondary | ICD-10-CM | POA: Diagnosis not present

## 2023-03-26 DIAGNOSIS — I484 Atypical atrial flutter: Secondary | ICD-10-CM | POA: Diagnosis not present

## 2023-03-26 DIAGNOSIS — E559 Vitamin D deficiency, unspecified: Secondary | ICD-10-CM | POA: Diagnosis not present

## 2023-03-26 DIAGNOSIS — I252 Old myocardial infarction: Secondary | ICD-10-CM | POA: Diagnosis not present

## 2023-03-26 DIAGNOSIS — E785 Hyperlipidemia, unspecified: Secondary | ICD-10-CM | POA: Diagnosis not present

## 2023-03-26 DIAGNOSIS — F320 Major depressive disorder, single episode, mild: Secondary | ICD-10-CM | POA: Diagnosis not present

## 2023-03-27 DIAGNOSIS — I2510 Atherosclerotic heart disease of native coronary artery without angina pectoris: Secondary | ICD-10-CM | POA: Diagnosis not present

## 2023-03-27 DIAGNOSIS — I77810 Thoracic aortic ectasia: Secondary | ICD-10-CM | POA: Diagnosis not present

## 2023-03-27 DIAGNOSIS — Z9849 Cataract extraction status, unspecified eye: Secondary | ICD-10-CM | POA: Diagnosis not present

## 2023-03-27 DIAGNOSIS — I495 Sick sinus syndrome: Secondary | ICD-10-CM | POA: Diagnosis not present

## 2023-03-27 DIAGNOSIS — I484 Atypical atrial flutter: Secondary | ICD-10-CM | POA: Diagnosis not present

## 2023-03-27 DIAGNOSIS — Z7901 Long term (current) use of anticoagulants: Secondary | ICD-10-CM | POA: Diagnosis not present

## 2023-03-27 DIAGNOSIS — I110 Hypertensive heart disease with heart failure: Secondary | ICD-10-CM | POA: Diagnosis not present

## 2023-03-27 DIAGNOSIS — D6859 Other primary thrombophilia: Secondary | ICD-10-CM | POA: Diagnosis not present

## 2023-03-27 DIAGNOSIS — G4733 Obstructive sleep apnea (adult) (pediatric): Secondary | ICD-10-CM | POA: Diagnosis not present

## 2023-03-27 DIAGNOSIS — R350 Frequency of micturition: Secondary | ICD-10-CM | POA: Diagnosis not present

## 2023-03-27 DIAGNOSIS — Z8616 Personal history of COVID-19: Secondary | ICD-10-CM | POA: Diagnosis not present

## 2023-03-27 DIAGNOSIS — I252 Old myocardial infarction: Secondary | ICD-10-CM | POA: Diagnosis not present

## 2023-03-27 DIAGNOSIS — Z950 Presence of cardiac pacemaker: Secondary | ICD-10-CM | POA: Diagnosis not present

## 2023-03-27 DIAGNOSIS — E1140 Type 2 diabetes mellitus with diabetic neuropathy, unspecified: Secondary | ICD-10-CM | POA: Diagnosis not present

## 2023-03-27 DIAGNOSIS — N401 Enlarged prostate with lower urinary tract symptoms: Secondary | ICD-10-CM | POA: Diagnosis not present

## 2023-03-27 DIAGNOSIS — F320 Major depressive disorder, single episode, mild: Secondary | ICD-10-CM | POA: Diagnosis not present

## 2023-03-27 DIAGNOSIS — M5137 Other intervertebral disc degeneration, lumbosacral region: Secondary | ICD-10-CM | POA: Diagnosis not present

## 2023-03-27 DIAGNOSIS — I5043 Acute on chronic combined systolic (congestive) and diastolic (congestive) heart failure: Secondary | ICD-10-CM | POA: Diagnosis not present

## 2023-03-27 DIAGNOSIS — Z7984 Long term (current) use of oral hypoglycemic drugs: Secondary | ICD-10-CM | POA: Diagnosis not present

## 2023-03-27 DIAGNOSIS — I080 Rheumatic disorders of both mitral and aortic valves: Secondary | ICD-10-CM | POA: Diagnosis not present

## 2023-03-27 DIAGNOSIS — I480 Paroxysmal atrial fibrillation: Secondary | ICD-10-CM | POA: Diagnosis not present

## 2023-03-27 DIAGNOSIS — M5416 Radiculopathy, lumbar region: Secondary | ICD-10-CM | POA: Diagnosis not present

## 2023-03-27 DIAGNOSIS — E785 Hyperlipidemia, unspecified: Secondary | ICD-10-CM | POA: Diagnosis not present

## 2023-03-27 DIAGNOSIS — K219 Gastro-esophageal reflux disease without esophagitis: Secondary | ICD-10-CM | POA: Diagnosis not present

## 2023-03-27 DIAGNOSIS — E559 Vitamin D deficiency, unspecified: Secondary | ICD-10-CM | POA: Diagnosis not present

## 2023-04-10 ENCOUNTER — Ambulatory Visit: Payer: Medicare HMO | Attending: Physician Assistant | Admitting: Physician Assistant

## 2023-04-10 VITALS — BP 124/82 | HR 72 | Ht 69.0 in | Wt 141.8 lb

## 2023-04-10 DIAGNOSIS — I5042 Chronic combined systolic (congestive) and diastolic (congestive) heart failure: Secondary | ICD-10-CM

## 2023-04-10 DIAGNOSIS — R079 Chest pain, unspecified: Secondary | ICD-10-CM | POA: Diagnosis not present

## 2023-04-10 DIAGNOSIS — I10 Essential (primary) hypertension: Secondary | ICD-10-CM | POA: Diagnosis not present

## 2023-04-10 DIAGNOSIS — Z950 Presence of cardiac pacemaker: Secondary | ICD-10-CM | POA: Diagnosis not present

## 2023-04-10 DIAGNOSIS — I480 Paroxysmal atrial fibrillation: Secondary | ICD-10-CM

## 2023-04-10 MED ORDER — ISOSORBIDE MONONITRATE ER 30 MG PO TB24: 15.0000 mg | ORAL_TABLET | Freq: Every day | ORAL | 3 refills | Status: DC

## 2023-04-10 NOTE — Patient Instructions (Signed)
Medication Instructions:  START ISOSORBIDE MONONITRATE 15 MG DAILY.(CUT 30 MG TABLET IN HALF) *If you need a refill on your cardiac medications before your next appointment, please call your pharmacy*   Lab Work: NO LABS If you have labs (blood work) drawn today and your tests are completely normal, you will receive your results only by: MyChart Message (if you have MyChart) OR A paper copy in the mail If you have any lab test that is abnormal or we need to change your treatment, we will call you to review the results.   Testing/Procedures: NO TESTING   Follow-Up: At [facility], you and your health needs are our priority.  As part of our continuing mission to provide you with exceptional heart care, we have created designated Provider Care Teams.  These Care Teams include your primary Cardiologist (physician) and Advanced Practice Providers (APPs -  Physician Assistants and Nurse Practitioners) who all work together to provide you with the care you need, when you need it.    Your next appointment:   6 week(s)  Provider:   Thurmon Fair, MD
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
## 2023-04-12 ENCOUNTER — Encounter: Payer: Self-pay | Admitting: Nurse Practitioner

## 2023-04-12 MED ORDER — OMEPRAZOLE 20 MG PO CPDR: 20.0000 mg | DELAYED_RELEASE_CAPSULE | Freq: Every day | ORAL | 1 refills | Status: DC

## 2023-04-12 NOTE — Telephone Encounter (Signed)
RX sent to pharmacy as requested. Message forwarded to [name], NP for further review

## 2023-04-17 ENCOUNTER — Ambulatory Visit: Payer: Medicare HMO

## 2023-04-17 DIAGNOSIS — I495 Sick sinus syndrome: Secondary | ICD-10-CM

## 2023-04-17 LAB — CUP PACEART REMOTE DEVICE CHECK
Battery Remaining Longevity: 118 mo
Battery Voltage: 3 V
Brady Statistic AP VP Percent: 0.32 %
Brady Statistic AP VS Percent: 99.45 %
Brady Statistic AS VP Percent: 0.05 %
Brady Statistic AS VS Percent: 0.18 %
Brady Statistic RA Percent Paced: 99.7 %
Brady Statistic RV Percent Paced: 0.39 %
Date Time Interrogation Session: 20240916211946
Implantable Lead Connection Status: 753985
Implantable Lead Connection Status: 753985
Implantable Lead Implant Date: 20201207
Implantable Lead Implant Date: 20201207
Implantable Lead Location: 753859
Implantable Lead Location: 753860
Implantable Lead Model: 5076
Implantable Lead Model: 5076
Implantable Pulse Generator Implant Date: 20201207
Lead Channel Impedance Value: 304 Ohm
Lead Channel Impedance Value: 342 Ohm
Lead Channel Impedance Value: 437 Ohm
Lead Channel Impedance Value: 456 Ohm
Lead Channel Pacing Threshold Amplitude: 0.625 V
Lead Channel Pacing Threshold Amplitude: 1.125 V
Lead Channel Pacing Threshold Pulse Width: 0.4 ms
Lead Channel Pacing Threshold Pulse Width: 0.4 ms
Lead Channel Sensing Intrinsic Amplitude: 1.5 mV
Lead Channel Sensing Intrinsic Amplitude: 1.5 mV
Lead Channel Sensing Intrinsic Amplitude: 28.75 mV
Lead Channel Sensing Intrinsic Amplitude: 28.75 mV
Lead Channel Setting Pacing Amplitude: 1.5 V
Lead Channel Setting Pacing Amplitude: 2.5 V
Lead Channel Setting Pacing Pulse Width: 0.4 ms
Lead Channel Setting Sensing Sensitivity: 1.2 mV
Zone Setting Status: 755011
Zone Setting Status: 755011

## 2023-05-03 NOTE — Progress Notes (Signed)
Remote pacemaker transmission.   

## 2023-05-07 ENCOUNTER — Encounter: Payer: Self-pay | Admitting: Cardiovascular Disease

## 2023-05-14 ENCOUNTER — Encounter: Payer: Self-pay | Admitting: Cardiovascular Disease

## 2023-05-14 NOTE — Telephone Encounter (Signed)
Note increase in AF burden.   Following alert received from [employer] received for  214 treated and 83 monitored AT/AF, all events on episode list between 05/07/23 at 13:15 and 05/13/23 at 13:57, ongoing AFL on presenting ECG, longest 24 min 59 sec, atrial ATP effective 13.5%, Burden 5.4%, V rates are controlled, on Xarelto per Epic. Routed to clinic for increased burden.

## 2023-05-16 NOTE — Telephone Encounter (Signed)
Runell Gess, MD  Scheryl Marten, RN Can renew Imdur, go up on torsemide to 20 mg a day.  Needs return office visit with APP in the next 1 to 2 weeks.  Sent MyChart message to the patient with instructions and asked to verify current Imdur dosage- we have 15 mg on file.

## 2023-05-18 NOTE — Progress Notes (Unsigned)
Cardiology Office Note:  .   Date:  05/21/2023  ID:  Jose Patel, DOB [birth year], MRN [id number] PCP: [name], NP  [facility] Providers Cardiologist:  Dr.Croitoru  }   History of Present Illness: .   Jose Patel is a 89 y.o. male history of chronic combined systolic diastolic heart failure, aortic insufficiency, atrial flutter/fibrillation status post radiofrequency ablation, hypertension, hyperlipidemia, sick sinus syndrome status post Medtronic dual-chamber pacemaker, and OSA.  Last seen by Azalee Course, PA on 04/10/2019 for posthospitalization for complaints of chest discomfort belching and nausea.  He was ruled out for cardiac etiology.  He was started on Imdur 15 mg daily to see if this would be helpful.  Otherwise the patient was stable from a cardiac standpoint.  Repeat echocardiogram revealed significantly decreased ejection fraction of 25 to 30%.  He has had steady decline in his overall physical function.  He refuses invasive study or interventions.  He is to be treated medically only.  He comes today accompanied by his daughter who is a Hospital doctor.  She has been sending messages back-and-forth concerning volume overload with her father and torsemide has been increased to 20 mg daily.  He had gone to [facility], and came back having gained approximately 10 to 12 pounds.  Dry weight runs around 145, and his weight was 159 when he returned home.  His weight has come down about 9 pounds pounds with this increased dose.  \  She is also indicating that she is in contact with Hospice for care and comfort for her father.  She has not yet met with them.  He is very frail, and not active, does use a walker for ambulation but remains very sedentary.  Minimal activity causes dyspnea NYHA Class III symptoms.  His voice has become raspy, he is not eating very well, and has chronic fatigue.  His O2 sat at rest is 97%.  His daughter states that when he complains of shortness of breath she  does check his O2 sat and it is normal.  His daughter states that his fluid retention is normally in his left arm, and not seen on lower extremities or abdomen.  He denies bleeding, hemoptysis, or melena.  ROS: Chronic shortness of breath with minimal exertion, weight gain, frailty, otherwise negative.  Studies Reviewed: .      Echocardiogram 09/15/2022 1. Left ventricular ejection fraction, by estimation, is 25 to 30%. The  left ventricle has severely decreased function. The left ventricle  demonstrates regional wall motion abnormalities (see scoring  diagram/findings for description). The left  ventricular internal cavity size was moderately dilated. There is mild  asymmetric left ventricular hypertrophy of the basal-septal segment. Left  ventricular diastolic parameters are consistent with Grade II diastolic  dysfunction (pseudonormalization).  There is akinesis of the left ventricular, inferior wall, inferoseptal  wall and inferolateral wall. There is hypokinesis of the left ventricular,  anterior wall and anteroseptal wall.   2. Right ventricular systolic function is normal. The right ventricular  size is normal.   3. Left atrial size was mild to moderately dilated.   4. Right atrial size was mild to moderately dilated.   5. The mitral valve is degenerative. Severe mitral valve regurgitation.  No evidence of mitral stenosis.   6. The aortic valve is tricuspid. There is mild calcification of the  aortic valve. Aortic valve regurgitation is severe. No aortic stenosis is  present.   7. Aortic dilatation noted. There is mild dilatation  of the aortic root,  measuring 41 mm. There is mild dilatation of the ascending aorta,  measuring 43 mm.   8. The inferior vena cava is normal in size with greater than 50%  respiratory variability, suggesting right atrial pressure of 3 mmHg.     Physical Exam:   VS:  BP 90/60   Pulse 69   Ht 5\' 10"  (1.778 m)   Wt 150 lb 3.2 oz (68.1 kg)   SpO2  99%   BMI 21.55 kg/m    Wt Readings from Last 3 Encounters:  05/21/23 150 lb 3.2 oz (68.1 kg)  04/10/23 141 lb 12.8 oz (64.3 kg)  03/19/23 147 lb (66.7 kg)    GEN: Thin, frail, well developed in no acute distress sitting in wheelchair NECK: No JVD; No carotid bruits CARDIAC: IRRR, no murmurs, rubs, gallops RESPIRATORY:  Clear to auscultation without rales, wheezing or rhonchi  ABDOMEN: Soft, non-tender, non-distended EXTREMITIES:  No edema; No deformity dependent edema in the posterior (tricep area) left arm with mild edema in the forearm.  No edema in the lower extremities.  ASSESSMENT AND PLAN: .   End-stage HFrEF: Most recent echocardiogram EF of 25 to 30%.  He is managed medically and does not want any invasive procedures.  Torsemide has been increased to 20 mg daily from 10 mg every other day.  I will not titrate up Entresto due to hypotension.  May need to consider metolazone for significant weight gain that is not responding to torsemide.  However I do not wish to dehydrate him.         He has had some weight loss as a result.  He continues dyspneic with minimal exertion, voice is  raspy, he is not eating well, daughter has indicated that Hospice is going to be contacted for care and comfort.  I will check a BMET and a BNP today.  His daughter is requesting a chest x-ray due to the shortness of breath.  I believe this is more related to reduced EF as lungs are clear.  However we will be thorough to evaluate further.   2.  Hypertension: Blood pressure is low normal for reduced EF.  No changes in medication regimen.  Blood pressure is too low to increase Entresto at this time.  3.  Paroxysmal atrial fibrillation: Remains on Xarelto.  No complaints of bleeding or issues with excessive bruising.  Heart rate is well-controlled currently.  4. Failure to Thrive:  His daughter is going to meet with Hospice.   5. Pacemaker in situ: Due to SSS. Remote checks per protocol.   6.  Hyperlipidemia: Currently not on statin.          Signed, Bettey Mare. Liborio Nixon, ANP, AACC

## 2023-05-21 ENCOUNTER — Ambulatory Visit
Admission: RE | Admit: 2023-05-21 | Discharge: 2023-05-21 | Disposition: A | Discharge status: Home / Self Care | Payer: Medicare HMO | Source: Ambulatory Visit | Attending: Adult Health | Admitting: Adult Health

## 2023-05-21 ENCOUNTER — Ambulatory Visit: Payer: Medicare HMO | Attending: Adult Health | Admitting: Adult Health

## 2023-05-21 ENCOUNTER — Encounter: Payer: Self-pay | Admitting: Adult Health

## 2023-05-21 VITALS — BP 90/60 | HR 69 | Ht 70.0 in | Wt 150.2 lb

## 2023-05-21 DIAGNOSIS — I495 Sick sinus syndrome: Secondary | ICD-10-CM

## 2023-05-21 DIAGNOSIS — I509 Heart failure, unspecified: Secondary | ICD-10-CM | POA: Diagnosis not present

## 2023-05-21 DIAGNOSIS — E7800 Pure hypercholesterolemia, unspecified: Secondary | ICD-10-CM

## 2023-05-21 DIAGNOSIS — R627 Adult failure to thrive: Secondary | ICD-10-CM | POA: Diagnosis not present

## 2023-05-21 DIAGNOSIS — I480 Paroxysmal atrial fibrillation: Secondary | ICD-10-CM

## 2023-05-21 DIAGNOSIS — I5043 Acute on chronic combined systolic (congestive) and diastolic (congestive) heart failure: Secondary | ICD-10-CM | POA: Diagnosis not present

## 2023-05-21 DIAGNOSIS — R0602 Shortness of breath: Secondary | ICD-10-CM | POA: Diagnosis not present

## 2023-05-21 DIAGNOSIS — I5030 Unspecified diastolic (congestive) heart failure: Secondary | ICD-10-CM | POA: Diagnosis not present

## 2023-05-21 DIAGNOSIS — I10 Essential (primary) hypertension: Secondary | ICD-10-CM | POA: Diagnosis not present

## 2023-05-21 NOTE — Telephone Encounter (Signed)
Patient has an appointment 05/21/23

## 2023-05-21 NOTE — Patient Instructions (Signed)
Medication Instructions:  No Changes *If you need a refill on your cardiac medications before your next appointment, please call your pharmacy*   Lab Work: BMET, BNP, CBC If you have labs (blood work) drawn today and your tests are completely normal, you will receive your results only by: MyChart Message (if you have MyChart) OR A paper copy in the mail If you have any lab test that is abnormal or we need to change your treatment, we will call you to review the results.   Testing/Procedures: [facility] [address]. A chest x-ray takes a picture of the organs and structures inside the chest, including the heart, lungs, and blood vessels. This test can show several things, including, whether the heart is enlarges; whether fluid is building up in the lungs; and whether pacemaker / defibrillator leads are still in place.    Follow-Up: At [facility], you and your health needs are our priority.  As part of our continuing mission to provide you with exceptional heart care, we have created designated Provider Care Teams.  These Care Teams include your primary Cardiologist (physician) and Advanced Practice Providers (APPs -  Physician Assistants and Nurse Practitioners) who all work together to provide you with the care you need, when you need it.  We recommend signing up for the patient portal called "MyChart".  Sign up information is provided on this After Visit Summary.  MyChart is used to connect with patients for Virtual Visits (Telemedicine).  Patients are able to view lab/test results, encounter notes, upcoming appointments, etc.  Non-urgent messages can be sent to your provider as well.   To learn more about what you can do with MyChart, go to [email or web address].    Your next appointment:   First Available ( Doctor Only).  Provider:   Thurmon Fair, MD

## 2023-05-22 ENCOUNTER — Telehealth: Payer: Self-pay

## 2023-05-22 ENCOUNTER — Encounter (HOSPITAL_COMMUNITY): Payer: Self-pay | Admitting: Emergency Medicine

## 2023-05-22 ENCOUNTER — Other Ambulatory Visit: Payer: Self-pay

## 2023-05-22 ENCOUNTER — Other Ambulatory Visit: Payer: Self-pay | Admitting: Nurse Practitioner

## 2023-05-22 ENCOUNTER — Emergency Department (HOSPITAL_COMMUNITY): Payer: Medicare HMO

## 2023-05-22 ENCOUNTER — Other Ambulatory Visit: Payer: Self-pay | Admitting: Cardiovascular Disease

## 2023-05-22 ENCOUNTER — Inpatient Hospital Stay (HOSPITAL_COMMUNITY)
Admission: EM | Admit: 2023-05-22 | Discharge: 2023-05-26 | DRG: 291 | Disposition: A | Discharge status: Hospice | Payer: Medicare HMO | Attending: Family Medicine | Admitting: Family Medicine

## 2023-05-22 DIAGNOSIS — Z66 Do not resuscitate: Secondary | ICD-10-CM | POA: Diagnosis present

## 2023-05-22 DIAGNOSIS — E119 Type 2 diabetes mellitus without complications: Secondary | ICD-10-CM | POA: Diagnosis not present

## 2023-05-22 DIAGNOSIS — I2510 Atherosclerotic heart disease of native coronary artery without angina pectoris: Secondary | ICD-10-CM | POA: Diagnosis present

## 2023-05-22 DIAGNOSIS — I5041 Acute combined systolic (congestive) and diastolic (congestive) heart failure: Secondary | ICD-10-CM

## 2023-05-22 DIAGNOSIS — E782 Mixed hyperlipidemia: Secondary | ICD-10-CM | POA: Diagnosis not present

## 2023-05-22 DIAGNOSIS — R7989 Other specified abnormal findings of blood chemistry: Secondary | ICD-10-CM | POA: Diagnosis present

## 2023-05-22 DIAGNOSIS — I5084 End stage heart failure: Secondary | ICD-10-CM | POA: Diagnosis present

## 2023-05-22 DIAGNOSIS — E875 Hyperkalemia: Secondary | ICD-10-CM

## 2023-05-22 DIAGNOSIS — I4892 Unspecified atrial flutter: Secondary | ICD-10-CM | POA: Diagnosis not present

## 2023-05-22 DIAGNOSIS — F0390 Unspecified dementia without behavioral disturbance: Secondary | ICD-10-CM

## 2023-05-22 DIAGNOSIS — D649 Anemia, unspecified: Secondary | ICD-10-CM | POA: Diagnosis not present

## 2023-05-22 DIAGNOSIS — E7800 Pure hypercholesterolemia, unspecified: Secondary | ICD-10-CM | POA: Diagnosis not present

## 2023-05-22 DIAGNOSIS — Z7901 Long term (current) use of anticoagulants: Secondary | ICD-10-CM

## 2023-05-22 DIAGNOSIS — Z8673 Personal history of transient ischemic attack (TIA), and cerebral infarction without residual deficits: Secondary | ICD-10-CM

## 2023-05-22 DIAGNOSIS — E1122 Type 2 diabetes mellitus with diabetic chronic kidney disease: Secondary | ICD-10-CM | POA: Diagnosis not present

## 2023-05-22 DIAGNOSIS — I130 Hypertensive heart and chronic kidney disease with heart failure and stage 1 through stage 4 chronic kidney disease, or unspecified chronic kidney disease: Secondary | ICD-10-CM | POA: Diagnosis not present

## 2023-05-22 DIAGNOSIS — Z85818 Personal history of malignant neoplasm of other sites of lip, oral cavity, and pharynx: Secondary | ICD-10-CM

## 2023-05-22 DIAGNOSIS — Z8616 Personal history of COVID-19: Secondary | ICD-10-CM

## 2023-05-22 DIAGNOSIS — Z950 Presence of cardiac pacemaker: Secondary | ICD-10-CM

## 2023-05-22 DIAGNOSIS — I495 Sick sinus syndrome: Secondary | ICD-10-CM | POA: Diagnosis present

## 2023-05-22 DIAGNOSIS — Z743 Need for continuous supervision: Secondary | ICD-10-CM | POA: Diagnosis not present

## 2023-05-22 DIAGNOSIS — Z7984 Long term (current) use of oral hypoglycemic drugs: Secondary | ICD-10-CM

## 2023-05-22 DIAGNOSIS — K219 Gastro-esophageal reflux disease without esophagitis: Secondary | ICD-10-CM | POA: Diagnosis not present

## 2023-05-22 DIAGNOSIS — F03A0 Unspecified dementia, mild, without behavioral disturbance, psychotic disturbance, mood disturbance, and anxiety: Secondary | ICD-10-CM | POA: Diagnosis present

## 2023-05-22 DIAGNOSIS — R0609 Other forms of dyspnea: Secondary | ICD-10-CM | POA: Diagnosis not present

## 2023-05-22 DIAGNOSIS — N179 Acute kidney failure, unspecified: Secondary | ICD-10-CM | POA: Diagnosis not present

## 2023-05-22 DIAGNOSIS — N1831 Chronic kidney disease, stage 3a: Secondary | ICD-10-CM | POA: Diagnosis not present

## 2023-05-22 DIAGNOSIS — I959 Hypotension, unspecified: Secondary | ICD-10-CM | POA: Diagnosis not present

## 2023-05-22 DIAGNOSIS — Z79899 Other long term (current) drug therapy: Secondary | ICD-10-CM

## 2023-05-22 DIAGNOSIS — Z87891 Personal history of nicotine dependence: Secondary | ICD-10-CM | POA: Diagnosis not present

## 2023-05-22 DIAGNOSIS — I480 Paroxysmal atrial fibrillation: Secondary | ICD-10-CM | POA: Diagnosis present

## 2023-05-22 DIAGNOSIS — R0602 Shortness of breath: Secondary | ICD-10-CM | POA: Diagnosis not present

## 2023-05-22 DIAGNOSIS — I252 Old myocardial infarction: Secondary | ICD-10-CM

## 2023-05-22 DIAGNOSIS — D631 Anemia in chronic kidney disease: Secondary | ICD-10-CM | POA: Diagnosis present

## 2023-05-22 DIAGNOSIS — I509 Heart failure, unspecified: Principal | ICD-10-CM

## 2023-05-22 DIAGNOSIS — N183 Chronic kidney disease, stage 3 unspecified: Secondary | ICD-10-CM

## 2023-05-22 DIAGNOSIS — Z888 Allergy status to other drugs, medicaments and biological substances status: Secondary | ICD-10-CM

## 2023-05-22 DIAGNOSIS — Z515 Encounter for palliative care: Secondary | ICD-10-CM | POA: Diagnosis not present

## 2023-05-22 DIAGNOSIS — I10 Essential (primary) hypertension: Secondary | ICD-10-CM | POA: Diagnosis not present

## 2023-05-22 DIAGNOSIS — I080 Rheumatic disorders of both mitral and aortic valves: Secondary | ICD-10-CM | POA: Diagnosis not present

## 2023-05-22 DIAGNOSIS — N400 Enlarged prostate without lower urinary tract symptoms: Secondary | ICD-10-CM | POA: Diagnosis not present

## 2023-05-22 DIAGNOSIS — R531 Weakness: Secondary | ICD-10-CM | POA: Diagnosis not present

## 2023-05-22 DIAGNOSIS — Z8349 Family history of other endocrine, nutritional and metabolic diseases: Secondary | ICD-10-CM

## 2023-05-22 DIAGNOSIS — Z8249 Family history of ischemic heart disease and other diseases of the circulatory system: Secondary | ICD-10-CM

## 2023-05-22 DIAGNOSIS — I5043 Acute on chronic combined systolic (congestive) and diastolic (congestive) heart failure: Secondary | ICD-10-CM | POA: Diagnosis not present

## 2023-05-22 DIAGNOSIS — I5023 Acute on chronic systolic (congestive) heart failure: Secondary | ICD-10-CM | POA: Insufficient documentation

## 2023-05-22 DIAGNOSIS — R0989 Other specified symptoms and signs involving the circulatory and respiratory systems: Secondary | ICD-10-CM | POA: Diagnosis not present

## 2023-05-22 DIAGNOSIS — Z8589 Personal history of malignant neoplasm of other organs and systems: Secondary | ICD-10-CM

## 2023-05-22 DIAGNOSIS — T502X5A Adverse effect of carbonic-anhydrase inhibitors, benzothiadiazides and other diuretics, initial encounter: Secondary | ICD-10-CM | POA: Diagnosis present

## 2023-05-22 DIAGNOSIS — Z8261 Family history of arthritis: Secondary | ICD-10-CM

## 2023-05-22 DIAGNOSIS — R404 Transient alteration of awareness: Secondary | ICD-10-CM | POA: Diagnosis not present

## 2023-05-22 DIAGNOSIS — J90 Pleural effusion, not elsewhere classified: Secondary | ICD-10-CM | POA: Diagnosis not present

## 2023-05-22 DIAGNOSIS — I517 Cardiomegaly: Secondary | ICD-10-CM | POA: Diagnosis not present

## 2023-05-22 LAB — BASIC METABOLIC PANEL
Anion gap: 14 (ref 5–15)
BUN/Creatinine Ratio: 17 (ref 10–24)
BUN: 40 mg/dL — ABNORMAL HIGH (ref 8–27)
BUN: 39 mg/dL — ABNORMAL HIGH (ref 8–23)
CO2: 22 mmol/L (ref 20–29)
CO2: 19 mmol/L — ABNORMAL LOW (ref 22–32)
Calcium: 8.6 mg/dL (ref 8.6–10.2)
Calcium: 8.3 mg/dL — ABNORMAL LOW (ref 8.9–10.3)
Chloride: 106 mmol/L (ref 96–106)
Chloride: 107 mmol/L (ref 98–111)
Creatinine, Ser: 2.29 mg/dL — ABNORMAL HIGH (ref 0.76–1.27)
Creatinine, Ser: 1.99 mg/dL — ABNORMAL HIGH (ref 0.61–1.24)
GFR, Estimated: 32 mL/min — ABNORMAL LOW (ref >60)
Glucose, Bld: 100 mg/dL — ABNORMAL HIGH (ref 70–99)
Glucose: 163 mg/dL — ABNORMAL HIGH (ref 70–99)
Potassium: 4.3 mmol/L (ref 3.5–5.2)
Potassium: 5.7 mmol/L — ABNORMAL HIGH (ref 3.5–5.1)
Sodium: 143 mmol/L (ref 134–144)
Sodium: 140 mmol/L (ref 135–145)
eGFR: 27 mL/min/{1.73_m2} — ABNORMAL LOW (ref >59)

## 2023-05-22 LAB — URINALYSIS, ROUTINE W REFLEX MICROSCOPIC
Bilirubin Urine: NEGATIVE
Glucose, UA: 50 mg/dL — AB
Hgb urine dipstick: NEGATIVE
Ketones, ur: NEGATIVE mg/dL
Leukocytes,Ua: NEGATIVE
Nitrite: NEGATIVE
Protein, ur: NEGATIVE mg/dL
Specific Gravity, Urine: 1.01 (ref 1.005–1.030)
pH: 5 (ref 5.0–8.0)

## 2023-05-22 LAB — BRAIN NATRIURETIC PEPTIDE
B Natriuretic Peptide: >4500 pg/mL — ABNORMAL HIGH (ref 0.0–100.0)
BNP: >5000 pg/mL — AB (ref 0.0–100.0)

## 2023-05-22 LAB — TROPONIN I (HIGH SENSITIVITY)
Troponin I (High Sensitivity): 199 ng/L (ref <18)
Troponin I (High Sensitivity): 231 ng/L (ref <18)

## 2023-05-22 LAB — CBC WITH DIFFERENTIAL/PLATELET
Abs Immature Granulocytes: 0.03 10*3/uL (ref 0.00–0.07)
Basophils Absolute: 0 10*3/uL (ref 0.0–0.1)
Basophils Relative: 0 %
Eosinophils Absolute: 0.2 10*3/uL (ref 0.0–0.5)
Eosinophils Relative: 2 %
HCT: 37.8 % — ABNORMAL LOW (ref 39.0–52.0)
Hemoglobin: 11.5 g/dL — ABNORMAL LOW (ref 13.0–17.0)
Immature Granulocytes: 0 %
Lymphocytes Relative: 20 %
Lymphs Abs: 1.5 10*3/uL (ref 0.7–4.0)
MCH: 31.9 pg (ref 26.0–34.0)
MCHC: 30.4 g/dL (ref 30.0–36.0)
MCV: 105 fL — ABNORMAL HIGH (ref 80.0–100.0)
Monocytes Absolute: 0.4 10*3/uL (ref 0.1–1.0)
Monocytes Relative: 5 %
Neutro Abs: 5.3 10*3/uL (ref 1.7–7.7)
Neutrophils Relative %: 73 %
Platelets: 187 10*3/uL (ref 150–400)
RBC: 3.6 MIL/uL — ABNORMAL LOW (ref 4.22–5.81)
RDW: 15.9 % — ABNORMAL HIGH (ref 11.5–15.5)
WBC: 7.4 10*3/uL (ref 4.0–10.5)
nRBC: 0 % (ref 0.0–0.2)

## 2023-05-22 LAB — CBC
Hematocrit: 36.4 % — ABNORMAL LOW (ref 37.5–51.0)
Hemoglobin: 11.9 g/dL — ABNORMAL LOW (ref 13.0–17.7)
MCH: 32.9 pg (ref 26.6–33.0)
MCHC: 32.7 g/dL (ref 31.5–35.7)
MCV: 101 fL — ABNORMAL HIGH (ref 79–97)
Platelets: 253 10*3/uL (ref 150–450)
RBC: 3.62 x10E6/uL — ABNORMAL LOW (ref 4.14–5.80)
RDW: 13.1 % (ref 11.6–15.4)
WBC: 9.3 10*3/uL (ref 3.4–10.8)

## 2023-05-22 LAB — CREATININE, URINE, RANDOM: Creatinine, Urine: 73 mg/dL

## 2023-05-22 LAB — SODIUM, URINE, RANDOM: Sodium, Ur: 53 mmol/L

## 2023-05-22 MED ORDER — METOPROLOL SUCCINATE ER 50 MG PO TB24
50.0000 mg | ORAL_TABLET | Freq: Every day | ORAL | Status: DC
  Filled 2023-05-22: qty 1

## 2023-05-22 MED ORDER — FUROSEMIDE 10 MG/ML IJ SOLN
40.0000 mg | Freq: Two times a day (BID) | INTRAMUSCULAR | Status: DC
  Administered 2023-05-22 – 2023-05-24 (×5): 40 mg via INTRAVENOUS
  Filled 2023-05-22 (×6): qty 4

## 2023-05-22 MED ORDER — ONDANSETRON HCL 4 MG/2ML IJ SOLN: 4.0000 mg | Freq: Four times a day (QID) | INTRAMUSCULAR | Status: DC | PRN

## 2023-05-22 MED ORDER — RIVAROXABAN 15 MG PO TABS
15.0000 mg | ORAL_TABLET | Freq: Every day | ORAL | Status: DC
  Administered 2023-05-23 – 2023-05-25 (×3): 15 mg via ORAL
  Filled 2023-05-22 (×3): qty 1

## 2023-05-22 MED ORDER — CALCIUM GLUCONATE-NACL 1-0.675 GM/50ML-% IV SOLN
1.0000 g | Freq: Once | INTRAVENOUS | Status: AC
  Administered 2023-05-22: 1000 mg via INTRAVENOUS
  Filled 2023-05-22: qty 50

## 2023-05-22 MED ORDER — INSULIN ASPART 100 UNIT/ML IJ SOLN: 0.0000 [IU] | Freq: Three times a day (TID) | INTRAMUSCULAR | Status: DC

## 2023-05-22 MED ORDER — SODIUM ZIRCONIUM CYCLOSILICATE 5 G PO PACK
5.0000 g | PACK | Freq: Once | ORAL | Status: AC
  Administered 2023-05-22: 5 g via ORAL
  Filled 2023-05-22: qty 1

## 2023-05-22 MED ORDER — AMIODARONE HCL 100 MG PO TABS
100.0000 mg | ORAL_TABLET | Freq: Every day | ORAL | Status: DC
  Administered 2023-05-23 – 2023-05-26 (×4): 100 mg via ORAL
  Filled 2023-05-22 (×4): qty 1

## 2023-05-22 MED ORDER — ACETAMINOPHEN 650 MG RE SUPP: 650.0000 mg | Freq: Four times a day (QID) | RECTAL | Status: DC | PRN

## 2023-05-22 MED ORDER — RIVAROXABAN 10 MG PO TABS: 20.0000 mg | ORAL_TABLET | Freq: Every day | ORAL | Status: DC

## 2023-05-22 MED ORDER — ACETAMINOPHEN 325 MG PO TABS: 650.0000 mg | ORAL_TABLET | Freq: Four times a day (QID) | ORAL | Status: DC | PRN

## 2023-05-22 MED ORDER — MEMANTINE HCL 10 MG PO TABS
10.0000 mg | ORAL_TABLET | Freq: Two times a day (BID) | ORAL | Status: DC
  Administered 2023-05-22 – 2023-05-26 (×8): 10 mg via ORAL
  Filled 2023-05-22 (×8): qty 1

## 2023-05-22 MED ORDER — MELATONIN 3 MG PO TABS
3.0000 mg | ORAL_TABLET | Freq: Every evening | ORAL | Status: DC | PRN
  Administered 2023-05-25: 3 mg via ORAL
  Filled 2023-05-22: qty 1

## 2023-05-22 MED ORDER — PANTOPRAZOLE SODIUM 40 MG PO TBEC
40.0000 mg | DELAYED_RELEASE_TABLET | Freq: Every day | ORAL | Status: DC
  Administered 2023-05-23 – 2023-05-26 (×4): 40 mg via ORAL
  Filled 2023-05-22 (×4): qty 1

## 2023-05-22 NOTE — Consult Note (Signed)
Cardiology Consultation   Patient ID: Jose Patel MRN: [id number]; DOB: 1934/03/01  Admit date: 05/22/2023 Date of Consult: 05/22/2023  PCP:  [name], NP   [facility] Providers Cardiologist:  Thurmon Fair, MD       Requesting physician: Dr. Particia Nearing  Patient Profile:   Jose Patel is a 89 y.o. male with a hx of combined systolic and diastolic heart failure, aortic regurgitation, atrial flutter/fibrillation status post radiofrequency ablation, hypertension, hyperlipidemia, sick sinus syndrome status post Medtronic dual-chamber pacemaker, OSA, who is being seen 05/22/2023 for the evaluation of acute decompensated heart failure at the request of Dr. Particia Nearing.  History of Present Illness:   [name] presents to the hospital with a chief complaint of shortness of breath and volume overload.  As for his recent trip to [facility] he had noticed weight gain of approximately 10 to 12 pounds.  His dry weight usually is 145 pounds.  His home dose torsemide was increased which resulted in 9 pounds of weight loss and he was last seen by Joni Reining yesterday 05/21/2023 in the clinic.  Patient's daughter is a Nurse, learning disability and has been taking care of him closely and has been concerned with regards to his recent weight gain.  His daughter is also looking into hospice care with comfort measures given his frailty, immobility, sedentary lifestyle, minimal activity causes dyspnea, and chronic fatigue.  At the office visit yesterday 05/21/2023 labs and chest x-ray were ordered.  Independent review of labs from 05/21/2023 illustrates acute kidney injury likely secondary to diuresis, BNP >5000, and chest x-ray illustrated cardiomegaly with small pleural effusion and pulmonary edema.  Patient was referred to the hospital for acute decompensated heart failure management.  Cardiology was asked to evaluate the patient in consultation for acute decompensated heart failure by ED  provider.  In the emergency room initial documented vital signs are 99/57, pulse 64, 98% on RA, 16 breaths/min.  EKG illustrates atrial paced ventricular sensed rhythm.  Potassium is 5.7, serum creatinine 1.99, BNP >4500, initial high sensitive troponins 199.  Patient is seen in bed 33 in ED.  He is accompanied by his daughter Jose Patel at bedside also provides collateral history.  Patient has been compliant with medical therapy, no recent increase in salt intake, not sure exactly what resulted in the weight gain during his trip in [place].  Clinically denies anginal chest pain.  However, in August 2024 he was hospitalized for chest pain initially was diagnosed with reflux.  However the symptoms have improved with initiation of Imdur.  He has been experiencing shortness of breath with effort related activities as well as prolonged conversations.  Endorses orthopnea, PND, lower extremity swelling.  Past Medical History:  Diagnosis Date   A-fib (HCC)    Anticoagulated on Coumadin    Per records from [facility]    Aortic regurgitation    Aortic stenosis    CHF (congestive heart failure) (HCC)    Coronary artery disease    COVID    CVA (cerebral vascular accident) (HCC)    hx of CVA noted on CT from 02/19/18   Diabetes mellitus, type 2 (HCC)    Gastro-esophageal reflux disease without esophagitis    Per records from [facility] provider, [facility]   Heart attack [facility])    History of CT scan of head 02/19/2018   Per records from previous provider, [facility]. Chronic changes small vessel disease   History of  ECG    03/26/14- Sinus Tachycardia @ 107 bmp, QRS 78 msec, QT 298 msec, QTc 361 msec. 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 [facility])    Per  records from previous provider, [facility]    MI (myocardial infarction) [facility])    Per records from [facility]    Mitral regurgitation    Mitral stenosis    Other intervertebral disc degeneration, lumbar region    Per records from previous provider, [facility]   Radiculopathy, lumbar region    Per records from previous provider, [facility]   Sinus bradycardia    Per records from [facility]    Sleep apnea    Transient ischemic attack    10 to 12 years ago   Typical atrial flutter [facility])    Per records from [facility]     Past Surgical History:  Procedure Laterality Date   CARDIAC ELECTROPHYSIOLOGY STUDY AND ABLATION     CARDIOVERSION N/A 12/26/2018   Procedure: CARDIOVERSION;  Surgeon: Chrystie Nose, MD;  Location: [facility] ENDOSCOPY;  Service: Cardiovascular;  Laterality: N/A;   CARDIOVERSION N/A 05/14/2019   Procedure: CARDIOVERSION;  Surgeon: Thurmon Fair, MD;  Location: [facility] ENDOSCOPY;  Service: Cardiovascular;  Laterality: N/A;   CATARACT EXTRACTION     Debridement to right arm with MRSA infection     4 years ago   GALLBLADDER SURGERY     Per records from [facility]    PACEMAKER IMPLANT N/A 07/07/2019   Procedure: PACEMAKER IMPLANT;  Surgeon: Thurmon Fair, MD;  Location: [facility] INVASIVE CV LAB;  Service: Cardiovascular;  Laterality: N/A;   TEE WITHOUT CARDIOVERSION N/A 12/26/2018   Procedure: TRANSESOPHAGEAL ECHOCARDIOGRAM (TEE);  Surgeon: Chrystie Nose, MD;  Location: [facility] ENDOSCOPY;  Service: Cardiovascular;  Laterality: N/A;     Home Medications:  Prior to Admission medications   Medication Sig Start Date End Date Taking? Authorizing Provider  amiodarone (PACERONE) 100 MG tablet Take 1 tablet (100 mg total) by mouth daily. 02/21/23   Croitoru, Mihai, MD  Cholecalciferol (VITAMIN D) 125 MCG (5000 UT) CAPS Take 5,000 Units by mouth daily at 6 (six) AM. 11/12/22   [provider]  empagliflozin (JARDIANCE) 10 MG TABS tablet Take 1 tablet (10 mg total) by mouth daily before breakfast. 09/25/22   Croitoru, Rachelle Hora, MD  isosorbide mononitrate (IMDUR) 30 MG 24 hr tablet Take 0.5 tablets (15 mg total) by mouth daily. Patient taking differently: Take 30 mg by mouth daily. 04/10/23 07/09/23  Azalee Course, PA  memantine (NAMENDA) 10 MG tablet TAKE 1 TABLET BY MOUTH TWICE A DAY 05/22/23   [name], NP  metoprolol succinate (TOPROL-XL) 50 MG 24 hr tablet TAKE 1 TABLET BY MOUTH EVERY DAY 05/22/23   Croitoru, Mihai, MD  omeprazole (PRILOSEC) 20 MG capsule Take 1 capsule (20 mg total) by mouth daily. 04/12/23   [name], NP  potassium chloride (KLOR-CON) 10 MEQ tablet TAKE 1 TABLET BY MOUTH EVERY DAY 05/22/23   [name], NP  rivaroxaban (XARELTO) 20 MG TABS tablet TAKE ONE TABLET BY MOUTH ONE TIME DAILY WITH SUPPER 03/07/23   [name], NP  sacubitril-valsartan (ENTRESTO) 49-51 MG Take 1 tablet by mouth 2 (two) times daily. 09/26/22  Croitoru, Mihai, MD  spironolactone (ALDACTONE) 25 MG tablet Take 0.5 tablets (12.5 mg total) by mouth daily. 02/21/23   Croitoru, Mihai, MD  tamsulosin (FLOMAX) 0.4 MG CAPS capsule Take 1 capsule (0.4 mg total) by mouth daily. 02/21/23   [name], NP  torsemide (DEMADEX) 10 MG tablet Take 10 mg every Monday and Thursday in the morning Patient taking differently: Take 20 mg by mouth daily. Take 20 mg every Monday and Thursday in the morning 02/21/23   Croitoru, Mihai, MD    Inpatient Medications: Scheduled Meds:  sodium chloride flush  3 mL Intravenous Q12H   sodium zirconium cyclosilicate  5 g Oral Once    Allergies:    Allergies  Allergen Reactions   Aricept [Donepezil] Diarrhea, Nausea And Vomiting and Other (See Comments)    Cannot tolerate- bradycardia, also   Tropicamide Nausea And Vomiting    Used to dilate eyes (might be this- was switched to something this was tolerable, after this)    Adhesive [Tape] Rash    Social History:   Social History   Tobacco Use   Smoking status: Former    Current packs/day: 0.00    Types: Cigarettes, Cigars    Start date: 07/31/1952    Quit date: 07/31/1953    Years since quitting: 69.8   Smokeless tobacco: Never   Tobacco comments:    Quit at age 41  Vaping Use   Vaping status: Never Used  Substance Use Topics   Alcohol use: Never   Drug use: Never   Family History:   Family History  Problem Relation Age of Onset   Heart failure Mother    Hyperlipidemia Mother    Hypertension Son    Hyperlipidemia Son    Arthritis Son    Arthritis Daughter    Hypertension Son      ROS:  Review of Systems  Constitutional: Positive for decreased appetite and weight gain.  Cardiovascular:  Positive for dyspnea on exertion, leg swelling, orthopnea and paroxysmal nocturnal dyspnea. Negative for chest pain, claudication, irregular heartbeat, near-syncope, palpitations and syncope.  Respiratory:  Positive for shortness of breath.   Hematologic/Lymphatic: Negative for bleeding problem.  Musculoskeletal:  Negative for muscle cramps and myalgias.  Neurological:  Negative for dizziness and light-headedness.   Physical Exam/Data:   Vitals:   05/22/23 1620 05/22/23 1845 05/22/23 1900 05/22/23 1915  BP:  (!) 97/58 (!) 99/56 103/65  Pulse:  60 (!) 59 (!) 59  Resp:  17 10 20   Temp: 97.9 F (36.6 C)     TempSrc: Oral     SpO2:  95% 99% 100%  Weight:      Height:       No intake or output data in the 24 hours ending 05/22/23 1951    05/22/2023    2:59 PM 05/21/2023    1:30 PM 04/10/2023   10:03 AM  Last 3 Weights  Weight (lbs) 150 lb 3.2 oz 150 lb 3.2 oz 141 lb 12.8 oz  Weight (kg) 68.13 kg 68.13 kg 64.32 kg     Body mass index is 21.55 kg/m.   Physical Exam  Constitutional: No distress. He appears chronically ill.  hemodynamically stable  Neck: JVD present.  Cardiovascular: Normal rate, regular rhythm, S1 normal, S2 normal, intact  distal pulses and normal pulses. Exam reveals no gallop, no S3 and no S4.  No murmur heard. Pulmonary/Chest: Effort normal. No stridor. He has no wheezes. He has bibasilar rales.  Left sided pacemaker in  place  Abdominal: Soft. Bowel sounds are normal. He exhibits no distension. There is no abdominal tenderness.  Musculoskeletal:        General: Edema (Predominantly mid shin and distally) present.     Cervical back: Neck supple.  Neurological: He is alert and oriented to person, place, and time. He has intact cranial nerves (2-12).  Skin: Skin is moist.  Bilateral lower extremities are cool to touch.     EKG:  The EKG was personally reviewed and demonstrates:  05/22/2023: Atrial paced ventricular sensed rhythm, prolonged AV delay, no obvious myocardial injury pattern.  Telemetry:  Telemetry was personally reviewed and demonstrates: Atrial paced rhythm with rare ectopy  Relevant CV Studies: Echo 09/15/2022: 1. Left ventricular ejection fraction, by estimation, is 25 to 30%. The left ventricle has severely decreased function. The left ventricle demonstrates regional wall motion abnormalities (see scoring diagram/findings for description). The left ventricular internal cavity size was moderately dilated. There is mild asymmetric left ventricular hypertrophy of the basal-septal segment. Left ventricular diastolic parameters are consistent with Grade II diastolic dysfunction (pseudonormalization). There is akinesis of the left ventricular, inferior wall, inferoseptal wall and inferolateral wall. There is hypokinesis of the left ventricular, anterior wall and anteroseptal wall. 2. Right ventricular systolic function is normal. The right ventricular size is normal. 3. Left atrial size was mild to moderately dilated. 4. Right atrial size was mild to moderately dilated. 5. The mitral valve is degenerative. Severe mitral valve regurgitation. No evidence of mitral stenosis. 6. The aortic valve is  tricuspid. There is mild calcification of the aortic valve. Aortic valve regurgitation is severe. No aortic stenosis is present. 7. Aortic dilatation noted. There is mild dilatation of the aortic root, measuring 41 mm. There is mild dilatation of the ascending aorta, measuring 43 mm. 8. The inferior vena cava is normal in size with greater than 50% respiratory variability, suggesting right atrial pressure of 3 mmHg.  Laboratory Data:  High Sensitivity Troponin:   Recent Labs  Lab 05/22/23 1534 05/22/23 1733  TROPONINIHS 199* 231*     Chemistry Recent Labs  Lab 05/21/23 1416 05/22/23 1534  NA 143 140  K 4.3 5.7*  CL 106 107  CO2 22 19*  GLUCOSE 163* 100*  BUN 40* 39*  CREATININE 2.29* 1.99*  CALCIUM 8.6 8.3*  GFRNONAA  --  32*  ANIONGAP  --  14    No results for input(s): "PROT", "ALBUMIN", "AST", "ALT", "ALKPHOS", "BILITOT" in the last 168 hours. Lipids No results for input(s): "CHOL", "TRIG", "HDL", "LABVLDL", "LDLCALC", "CHOLHDL" in the last 168 hours.  Hematology Recent Labs  Lab 05/21/23 1416 05/22/23 1534  WBC 9.3 7.4  RBC 3.62* 3.60*  HGB 11.9* 11.5*  HCT 36.4* 37.8*  MCV 101* 105.0*  MCH 32.9 31.9  MCHC 32.7 30.4  RDW 13.1 15.9*  PLT 253 187   Thyroid No results for input(s): "TSH", "FREET4" in the last 168 hours.  BNP Recent Labs  Lab 05/21/23 1416 05/22/23 1534  BNP >5000.0* >4,500.0*    DDimer No results for input(s): "DDIMER" in the last 168 hours.   Radiology/Studies:  DG Chest Port 1 View  Result Date: 05/22/2023 CLINICAL DATA:  Shortness of breath EXAM: PORTABLE CHEST 1 VIEW COMPARISON:  Chest x-ray 05/21/2023 FINDINGS: Left-sided pacemaker again seen. The heart is enlarged, unchanged. There are small pleural effusions. There central pulmonary vascular congestion. There is atelectasis in the left lung base. There is no pneumothorax or acute fracture. IMPRESSION: Cardiomegaly with small pleural effusions  and central pulmonary vascular  congestion. Electronically Signed   By: Darliss Cheney M.D.   On: 05/22/2023 17:46   DG Chest 2 View  Result Date: 05/21/2023 CLINICAL DATA:  Shortness of breath. Diastolic congestive heart failure. EXAM: CHEST - 2 VIEW COMPARISON:  Radiograph 03/19/2023 FINDINGS: The heart is enlarged. Left-sided pacemaker in place. There are small bilateral pleural effusions. Vascular congestion with subtle septal thickening typical of edema. No confluent consolidation. No pneumothorax. IMPRESSION: Cardiomegaly with small pleural effusions and pulmonary edema consistent with CHF. Electronically Signed   By: Narda Rutherford M.D.   On: 05/21/2023 18:22     Assessment and Plan:  ASSESSMENT:  Acute decompensated systolic and diastolic heart failure, stage C, NYHA class III/IV Acute kidney injury secondary to diuresis/cardiorenal physiology. Elevated high sensitive troponin secondary to supply demand ischemia. Hyperkalemia. Paroxysmal atrial fibrillation/flutter status post radiofrequency ablation. Long-term oral anticoagulation. Long-term antiarrhythmic medications. Sick sinus syndrome status post pacemaker implant Hypertension w/ CKD and HF. Hyperlipidemia. Former smoker.   PLAN: Acute decompensated systolic and diastolic heart failure, stage C, NYHA class III/IV: Has been compliant with medical therapy and a low-salt diet at home. No obvious precipitating factor at this time. Physical examination consistent with volume overload. Will focus on diuresis at this time. Will hold off on Entresto, Jardiance, spironolactone given the acute kidney injury. Will check LFTs, lactic acid to evaluate for organ perfusion. Daughter is in the process of getting hospice involved with comfort measures at home.  They do not want to be aggressive with medical therapy.  Patient wishes to be DNR.  However may consider the use of inotrope/vasopressor support. Echo will be ordered to evaluate for structural heart disease and  left ventricular systolic function. Lasix 40 mg IV push twice daily Strict I's and O's. Daily weights.  Acute kidney injury: Likely secondary to diuresis and cardiorenal physiology. Will focus on diuresis for now. Will hold off on GDMT until renal function improves and he is adequately diuresed. Avoid nephrotoxic agents. Monitor BUN and creatinine.  Elevated troponins secondary to supply/demand ischemia: Denies anginal chest pain at this time. In the past he did have precordial pain and Imdur has been uptitrated. But he wishes to continue with conservative measures and has refused heart catheterization in the past. EKG is nonischemic. Monitor for now  Hyperkalemia: Management per primary team.  Paroxysmal atrial fibrillation/flutter: History of radiofrequency ablation. Low burden of A-fib as per his pacemaker interrogations in the past. Rate control: Metoprolol. Rhythm control: Amiodarone. Thromboembolic prophylaxis: Xarelto Does not endorse evidence of bleeding. Estimated Creatinine Clearance: 25.2 mL/min (A) (by C-G formula based on SCr of 1.99 mg/dL (H)). His home dose Xarelto will need to be changed given his creatinine clearance.  Recommend pharmacy to dose.  Hypertension: Hypotension on arrival. Medication changes as discussed above. For now we will hold off on vasopressor and inotropic support until family decides their goals of care and also in the meantime we will check LFTs and lactic acid.  Hyperlipidemia: Continue current medical therapy.  Will follow the patient with you during his hospitalization.   Risk Assessment/Risk Scores:        New York Heart Association (NYHA) Functional Class NYHA Class III  CHA2DS2-VASc Score = 5   This indicates a 7.2% annual risk of stroke. The patient's score is based upon: CHF History: 1 HTN History: 1 Diabetes History: 0 Stroke History: 0 Vascular Disease History: 1 Age Score: 2 Gender Score: 0   For questions  or updates, please  contact [facility] Please consult [email or web address] for contact info under   Signed, Tessa Lerner, DO, [facility]  [facility]  [address] [place], [place] [address] Pager: [phone] Office: [phone] 05/22/2023 7:51 PM

## 2023-05-22 NOTE — ED Notes (Signed)
IV attempt unsuccessful at this time, another RN will assist after finishing with their PT.

## 2023-05-22 NOTE — Telephone Encounter (Signed)
Called patients daughter Alinda Money. To Advised patient will need to go to Hospital due to Chest X-Ray showing fluid in lung. Patients daughter had understanding  of instructions regarding  chest X-Ray.

## 2023-05-22 NOTE — Telephone Encounter (Signed)
Returned call to patients daughter  to advise a direct admission to hospital can only be done if patient was in the office. Advised daughter to take patient to ED. Patients daughter had understanding of instructions.

## 2023-05-22 NOTE — H&P (Signed)
History and Physical      Jose Patel:[id number] DOB: [birth year] DOA: 05/22/2023; DOS: 05/22/2023  PCP: Jose Seller, NP  Patient coming from: home   I have personally briefly reviewed patient's old medical records in [facility]  Chief Complaint: Shortness of breath  HPI: Jose Patel is a 89 y.o. male with medical history significant for chronic systolic/diastolic heart failure, paroxysmal atrial fibrillation currently anticoagulated on Xarelto, colocated by sick sinus syndrome status post pacemaker placement in December 2020, type 2 diabetes mellitus, essential hypertension, BPH, who is admitted to [facility] on 05/22/2023 with acute on chronic systolic/diastolic heart failure after presenting from home to [facility] ED complaining of shortness of breath.   The patient reports 10 days of progressive shortness of breath associate with worsening orthopnea as well as worsening edema in the bilateral lower extremities.  He has been following with his outpatient cardiologist to address this, and, and spite of interval increase in dosing of outpatient torsemide, he has noted further progression in the above symptoms.  In his contacts, the patient had follow-up with his outpatient cardiologist yesterday, 05/21/2023, at which time chest x-ray showed worsening volume overload, prompting cardiology to recommend to the patient that he present to the emergency department for admission for further evaluation management of acute on chronic systolic/diastolic heart failure including initiation of IV diuresis efforts.  The patient's baseline diuretic regimen is noted to be torsemide 20 mg p.o. daily.  However, over the last several days, outpatient cardiology has added an extra 20 mg dose on Monday and Thursday.  This is in addition to daily spironolactone.  He is also on scheduled potassium chloride 10 mill colons per day.  The patient denies any recent chest pain, palpitations [place]  recent nausea, vomiting, dizziness, presyncope, syncope.  He also denies any recent subjective fever, chills, rigors, generalized myalgias.  No recent mopped assist.  Per chart review, most recent echocardiogram occurred in February 2024, which was notable for LVEF 25 to 30%, grade 2 diastolic dysfunction, normal right ventricular cell function and mild relative moderately dilated bilateral atria, severe mitral regurgitation.  Per chart review, patient's baseline creatinine is noted to be 1.1-1.3, while his baseline hemoglobin range is noted to be 13-15.     ED Course:  Vital signs in the ED were notable for the following: Afebrile; heart rates in the range of 59-67; stoppages in the high 90s to low 100s; respiratory rate 16-20, oxygen saturation 95 to 1% on room air.  Labs were notable for the following: BMP notable for the following: Sodium 140, potassium 5.7, creatinine 1.99 compared to 1.29 on 03/20/2023, glucose 100, calcium 8.3.  BNP greater than 4500 relative to most recent prior value of 1100 on August 2024.  High sensitive troponin I initially 199, therapy value trending up slightly to 231, relative to most recent prior high sensitive troponin I value of 62 on 03/20/2023.  CBC notable for evidence will count 7400, hemoglobin 11.5 associated with macrocytic finding and relative demonstrates a prior hemoglobin data point of 13.3 in August 2014 with a count 185.  Urinalysis ordered, results currently pending.  Per my interpretation, EKG in ED demonstrated the following: Per read, atrial paced with prolonged AV conduction, heart rate 67, and no overt evidence of T wave or ST changes, Cle[place] evidence of ST elevation.  Imaging in the ED, per corresponding formal radiology read, was notable for the following: 1 view chest x-ray showed cardiomegaly with small bilateral pleural effusions as  well as increased pulmonary vascular congestion, in the absence of evidence of infiltrate or pneumothorax.  EDP  has discussed patient's case with on-call cardiology, Dr. Tessa Lerner, who will formally consult, and will be managing diuresis efforts, which are complicated by the the patient's borderline hypotension.  While in the ED, the following were administered: Lokelma 5 g p.o. x 1 dose.  Subsequently, the patient was admitted for further evaluation management of acute on chronic systolic/diastolic heart failure complicated by acute kidney injury, hyperkalemia, mildly elevated troponin, and acute anemia.    Review of Systems: As per HPI otherwise 10 point review of systems negative.   Past Medical History:  Diagnosis Date   A-fib (HCC)    Anticoagulated on Coumadin    Per records from [facility]    Aortic regurgitation    Aortic stenosis    CHF (congestive heart failure) (HCC)    Coronary artery disease    COVID    CVA (cerebral vascular accident) (HCC)    hx of CVA noted on CT from 02/19/18   Diabetes mellitus, type 2 (HCC)    Gastro-esophageal reflux disease without esophagitis    Per records from previous provider, [facility]   Heart attack [facility])    History of CT scan of head 02/19/2018   Per records from previous provider, [facility]. Chronic changes small vessel disease   History of ECG    03/26/14- Sinus Tachycardia @ 107 bmp, QRS 78 msec, QT 298 msec, QTc 361 msec. 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 [facility])    Per records from previous provider, [facility]    MI (myocardial infarction) [facility])    Per records from [facility]    Mitral regurgitation    Mitral stenosis    Other intervertebral disc degeneration, lumbar region    Per records from previous provider, [facility]   Radiculopathy, lumbar region    Per records from previous provider, [facility]   Sinus bradycardia    Per records from [facility]    Sleep apnea    Transient ischemic attack    10 to 12 years ago   Typical atrial flutter [facility])    Per records from [facility]     Past Surgical History:  Procedure Laterality Date   CARDIAC ELECTROPHYSIOLOGY STUDY AND ABLATION     CARDIOVERSION N/A 12/26/2018   Procedure: CARDIOVERSION;  Surgeon: Chrystie Nose, MD;  Location: [facility] ENDOSCOPY;  Service: Cardiovascular;  Laterality: N/A;   CARDIOVERSION N/A 05/14/2019   Procedure: CARDIOVERSION;  Surgeon: Thurmon Fair, MD;  Location: [facility] ENDOSCOPY;  Service: Cardiovascular;  Laterality: N/A;   CATARACT EXTRACTION     Debridement to right arm with MRSA infection     4 years ago   GALLBLADDER SURGERY     Per records from [facility] Medicine    PACEMAKER IMPLANT N/A 07/07/2019   Procedure: PACEMAKER IMPLANT;  Surgeon: Thurmon Fair, MD;  Location: [facility] INVASIVE CV LAB;  Service: Cardiovascular;  Laterality: N/A;   TEE WITHOUT CARDIOVERSION N/A 12/26/2018   Procedure: TRANSESOPHAGEAL ECHOCARDIOGRAM (TEE);  Surgeon: Chrystie Nose, MD;  Location: [facility] ENDOSCOPY;  Service: Cardiovascular;  Laterality: N/A;    Social History:  reports that he quit smoking about 69 years ago. His smoking use included cigarettes and cigars. He started smoking about 70 years ago. He has never used smokeless tobacco. He reports that he does not drink alcohol and does not use drugs.   Allergies  Allergen Reactions   Aricept [Donepezil] Diarrhea, Nausea And Vomiting and Other (See Comments)    Cannot tolerate- bradycardia, also   Tropicamide Nausea And Vomiting    Used to dilate eyes (might be this- was switched to something this was tolerable, after this)   Adhesive [Tape] Rash    Family History  Problem Relation Age of Onset   Heart failure Mother    Hyperlipidemia Mother     Hypertension Son    Hyperlipidemia Son    Arthritis Son    Arthritis Daughter    Hypertension Son     Family history reviewed and not pertinent    Prior to Admission medications   Medication Sig Start Date End Date Taking? Authorizing Provider  amiodarone (PACERONE) 100 MG tablet Take 1 tablet (100 mg total) by mouth daily. 02/21/23  Yes Croitoru, Mihai, MD  Cholecalciferol (VITAMIN D) 125 MCG (5000 UT) CAPS Take 5,000 Units by mouth daily at 6 (six) AM. 11/12/22  Yes [provider]  empagliflozin (JARDIANCE) 10 MG TABS tablet Take 1 tablet (10 mg total) by mouth daily before breakfast. 09/25/22  Yes Croitoru, Mihai, MD  isosorbide mononitrate (IMDUR) 30 MG 24 hr tablet Take 0.5 tablets (15 mg total) by mouth daily. Patient taking differently: Take 30 mg by mouth daily. 04/10/23 07/09/23 Yes Meng, Wynema Birch, PA  memantine (NAMENDA) 10 MG tablet TAKE 1 TABLET BY MOUTH TWICE A DAY 05/22/23  Yes Jose Seller, NP  metoprolol succinate (TOPROL-XL) 50 MG 24 hr tablet TAKE 1 TABLET BY MOUTH EVERY DAY 05/22/23  Yes Croitoru, Mihai, MD  omeprazole (PRILOSEC) 20 MG capsule Take 1 capsule (20 mg total) by mouth daily. 04/12/23  Yes Jose Seller, NP  potassium chloride (KLOR-CON) 10 MEQ tablet TAKE 1 TABLET BY MOUTH EVERY DAY 05/22/23  Yes Jose Seller, NP  rivaroxaban (XARELTO) 20 MG TABS tablet TAKE ONE TABLET BY MOUTH ONE TIME DAILY WITH SUPPER 03/07/23  Yes Eubanks, Janene Harvey, NP  sacubitril-valsartan (ENTRESTO) 49-51 MG Take 1 tablet by mouth 2 (two) times daily. 09/26/22  Yes Croitoru, Mihai, MD  spironolactone (ALDACTONE) 25 MG tablet Take 0.5 tablets (12.5 mg total) by mouth daily. 02/21/23  Yes Croitoru, Mihai, MD  tamsulosin (FLOMAX) 0.4 MG CAPS capsule Take 1 capsule (0.4 mg total) by mouth daily. 02/21/23  Yes Jose Seller, NP  torsemide (DEMADEX) 10 MG tablet Take 10 mg every Monday and Thursday in the morning Patient taking differently: Take 20 mg by mouth daily. 02/21/23   Yes Croitoru, Rachelle Hora, MD     Objective    Physical Exam: Vitals:   05/22/23 1620 05/22/23 1845 05/22/23 1900 05/22/23 1915  BP:  (!) 97/58 (!) 99/56 103/65  Pulse:  60 (!) 59 (!) 59  Resp:  17 10 20   Temp: 97.9 F (36.6 C)     TempSrc: Oral     SpO2:  95% 99% 100%  Weight:      Height:        General: appears to be stated age; alert, oriented Skin: warm, dry, no rash Head:  AT/[place] Mouth:  Oral mucosa membranes appear moist, normal dentition Neck: supple; trachea midline Heart:  RRR; did not appreciate any M/R/G Lungs: CTAB, did  not appreciate any wheezes, rales, or rhonchi Abdomen: + BS; soft, ND, NT Vascular: 2+ pedal pulses b/l; 2+ radial pulses b/l Extremities: Trace edema in the bilateral lower extremities, no muscle wasting Neuro: strength and sensation intact in upper and lower extremities b/l     Labs on Admission: I have personally reviewed following labs and imaging studies  CBC: Recent Labs  Lab 05/21/23 1416 05/22/23 1534  WBC 9.3 7.4  NEUTROABS  --  5.3  HGB 11.9* 11.5*  HCT 36.4* 37.8*  MCV 101* 105.0*  PLT 253 187   Basic Metabolic Panel: Recent Labs  Lab 05/21/23 1416 05/22/23 1534  NA 143 140  K 4.3 5.7*  CL 106 107  CO2 22 19*  GLUCOSE 163* 100*  BUN 40* 39*  CREATININE 2.29* 1.99*  CALCIUM 8.6 8.3*   GFR: Estimated Creatinine Clearance: 25.2 mL/min (A) (by C-G formula based on SCr of 1.99 mg/dL (H)). Liver Function Tests: No results for input(s): "AST", "ALT", "ALKPHOS", "BILITOT", "PROT", "ALBUMIN" in the last 168 hours. No results for input(s): "LIPASE", "AMYLASE" in the last 168 hours. No results for input(s): "AMMONIA" in the last 168 hours. Coagulation Profile: No results for input(s): "INR", "PROTIME" in the last 168 hours. Cardiac Enzymes: No results for input(s): "CKTOTAL", "CKMB", "CKMBINDEX", "TROPONINI" in the last 168 hours. BNP (last 3 results) Recent Labs    11/01/22 1521  PROBNP 27,410*   HbA1C: No results  for input(s): "HGBA1C" in the last 72 hours. CBG: No results for input(s): "GLUCAP" in the last 168 hours. Lipid Profile: No results for input(s): "CHOL", "HDL", "LDLCALC", "TRIG", "CHOLHDL", "LDLDIRECT" in the last 72 hours. Thyroid Function Tests: No results for input(s): "TSH", "T4TOTAL", "FREET4", "T3FREE", "THYROIDAB" in the last 72 hours. Anemia Panel: No results for input(s): "VITAMINB12", "FOLATE", "FERRITIN", "TIBC", "IRON", "RETICCTPCT" in the last 72 hours. Urine analysis:    Component Value Date/Time   COLORURINE YELLOW 03/24/2021 1954   APPEARANCEUR CLEAR 03/24/2021 1954   LABSPEC 1.010 03/24/2021 1954   PHURINE 6.5 03/24/2021 1954   GLUCOSEU NEGATIVE 03/24/2021 1954   HGBUR NEGATIVE 03/24/2021 1954   BILIRUBINUR NEGATIVE 03/24/2021 1954   BILIRUBINUR small 08/12/2018 1250   KETONESUR NEGATIVE 03/24/2021 1954   PROTEINUR NEGATIVE 03/24/2021 1954   UROBILINOGEN negative (A) 08/12/2018 1250   NITRITE NEGATIVE 03/24/2021 1954   LEUKOCYTESUR NEGATIVE 03/24/2021 1954    Radiological Exams on Admission: DG Chest Port 1 View  Result Date: 05/22/2023 CLINICAL DATA:  Shortness of breath EXAM: PORTABLE CHEST 1 VIEW COMPARISON:  Chest x-ray 05/21/2023 FINDINGS: Left-sided pacemaker again seen. The heart is enlarged, unchanged. There are small pleural effusions. There central pulmonary vascular congestion. There is atelectasis in the left lung base. There is no pneumothorax or acute fracture. IMPRESSION: Cardiomegaly with small pleural effusions and central pulmonary vascular congestion. Electronically Signed   By: Darliss Cheney M.D.   On: 05/22/2023 17:46   DG Chest 2 View  Result Date: 05/21/2023 CLINICAL DATA:  Shortness of breath. Diastolic congestive heart failure. EXAM: CHEST - 2 VIEW COMPARISON:  Radiograph 03/19/2023 FINDINGS: The heart is enlarged. Left-sided pacemaker in place. There are small bilateral pleural effusions. Vascular congestion with subtle septal  thickening typical of edema. No confluent consolidation. No pneumothorax. IMPRESSION: Cardiomegaly with small pleural effusions and pulmonary edema consistent with CHF. Electronically Signed   By: Narda Rutherford M.D.   On: 05/21/2023 18:22      Assessment/Plan   Principal Problem:   Acute on chronic combined systolic and diastolic  CHF (congestive heart failure) (HCC) Active Problems:   Essential hypertension   DM2 (diabetes mellitus, type 2) (HCC)   Paroxysmal atrial fibrillation (HCC)   Cardiac pacemaker in situ   AKI (acute kidney injury) (HCC)   Elevated troponin level not due myocardial infarction   Hyperkalemia   Long term current use of antiarrhythmic drug   Acute anemia   GERD (gastroesophageal reflux disease)   BPH (benign prostatic hyperplasia)     #) Acute on chronic systolic/diastolic heart failure: dx of acute decompensation on the basis of presenting 10 days of progressive shortness of breath/orthopnea, worsening peripheral edema, fourfold increase in BNP, as well as chest x-ray showing evidence of interval increase in pulmonary vascular congestion as well as development of small bilateral pleural effusions. This is in the context of a known history of chronic systolic/diastolic heart failure, with most recent echocardiogram performed in February 2024, which was notable for LVEF 25 to 30% with grade 2 diastolic signs, normal right ventricular systolic function and additional results as conveyed above.   Etiology leading to presenting acutely decompensated heart failure is unclear, and it is noted that this progressive volume overload has occurred and spite of recent efforts to increase his outpatient torsemide dose, as quantified above. I suspect that mildly elevated initial troponin is a consequence of underlying acutely decompensated heart failure as opposed to representing ACS causing presenting acute heart failure exacerbation, particularly in the absence of any recent  CP, and with presenting EKG showing no evidence of acute ischemic changes.  Likely an additional contribution from diminished renal clearance in the setting of presenting acute kidney injury. However, will continue to evaluate with further trending of troponin and close monitoring on tele.   The patient reports good compliance with his outpatient diuretic regimen as well as his additional cardiac medications including his goal-directed therapy that includes metoprolol succinate, Entresto, empagliflozin, as well as spironolactone.  However, of these medications, in the setting of AKI as well as hyperkalemia, will hold for now his Entresto, apical flows then, and spironolactone.  As he is already on a beta-blocker as an outpatient, will continue this.\  EDP has discussed patient's case with on-call cardiology, Dr. Tessa Lerner, who will formally consult, and will be managing diuresis efforts, which are complicated by the the patient's borderline hypotension.    Plan: monitor strict I's & O's and daily weights. Monitor on telemetry. CMP in the morning, including for monitoring trend of potassium, bicarbonate, and renal function in response to interval diuresis efforts. Add-on serum magnesium level, and repeat this level in the AM.  Cardiology to formally consult.  Diuresis per ensuing cardiology recommendations.  Continue outpatient beta-blocker, as above.  However, in setting of AKI, hyperkalemia, will hold Entresto, and will fluids and, spironolactone, and scheduled oral potassium supplementation for now.  Trend troponin.                   #) Acute Kidney Injury: Relative to patient's baseline creatinine of 1.1-1.3, with most recent prior serum creatinine data point noted to be 1.29 in August 2024, presenting creatinine found to be 1.99.  This appears to be prerenal in nature as a consequence of diminished renal perfusion gradient as a result of presenting acute on chronic systolic/diastolic  heart failure, as further detailed above.  Urinalysis with microscopy has been ordered, with result currently pending.  Potential additional pharmacologic contribution stemming from outpatient Entresto.   Will pursue additional diuresis efforts under the guidance/orders of cardiology, given  complicating factors include borderline hypotension, and closely monitor ensuing renal function trend in response to these cardiology measures.   Plan: monitor strict I's & O's and daily weights. Attempt to avoid nephrotoxic agents.  Hold home [place].  Hold on scheduled potassium chloride 10 mill colons p.o. daily refrain from NSAIDs. Repeat CMP in the morning. Check serum magnesium level.  Follow-up results of urinalysis with microscopy.  Add-on random urine sodium and random urine creatinine.  Further evaluation management of presenting acute on chronic systolic/diastolic heart failure, as outlined above.                  #) Hyperkalemia: Presenting serum potassium level noted to be 5.7, without corresponding EKG changes noted.  This appears to be multifactorial in nature, including contributions from acute kidney injury as well as from pharmacologic factors that include Entresto, spironolactone, as well as outpatient use of potassium chloride 10 mill colons p.o. daily.  The patient received Lokelma 5 g p.o. x 1 dose in the ED.  Given low serum calcium level, will also provide a dose of calcium gluconate for cardioprotective purposes at this time and plan to repeat BMP over the next few hours to assess for need for additional medical management.  Plan: Calcium gluconate 1 g IV x 1 dose now.  Repeat BMP at 1 AM.  CMP in the morning.  Hold home spironolactone, outpatient potassium chloride 10 mill colons p.o. daily.  Hold home [place].  Further evaluation management of presenting acute kidney injury, as further detailed above.  Will also hold home apical flows and for now.  Monitor on  telemetry.                #) Acute anemia: Presenting hemoglobin found to be 11.5, associated with macrocytic properties, relative to the patient's baseline hemoglobin range of 13-15.  Suspect contribution from interval decline in renal function, as outlined above.  However, given the macrocytic nature of his anemia, differential could also include the possibility of acute hepatic insufficiency as a consequence of congestive hepatopathy.  Will check INR to further assess hepatic synthetic function, and pursue additional diagnostic evaluation as outlined below.  No evidence to suggest active bleed at this time.  Plan: Add on iron studies, B12 level folic acid level, reticulocyte count, and INR.  Repeat CBC in the morning.  Further evaluation management of presenting acute kidney injury, as further detailed above.                 #) Paroxysmal atrial fibrillation: Documented history of such. In setting of CHA2DS2-VASc score of  5, there is an indication for chronic anticoagulation for thromboembolic prophylaxis. Consistent with this, patient is chronically anticoagulated on Xarelto. Home AV nodal blocking regimen: Metoprolol succinate.  He is also on a rhythm control strategy as an outpatient via use of oral amiodarone.  Most recent echocardiogram was performed in February 2024, with results as conveyed above. Presenting EKG a paced rhythm, without overt evidence of acute ischemic changes.   Plan: monitor strict I's & O's and daily weights. CMP/CBC in AM. Check serum mag level. Continue home AV nodal blocking regimen.  Continue outpatient amiodarone as well as Xarelto.  Monitor on telemetry.                   #) Type 2 Diabetes Mellitus: documented history of such. Home insulin regimen: None. Home oral hypoglycemic agents: Empagliflozin. presenting blood sugar: 100. Most recent A1c noted to be 5.9% when  checked in May 2024.  Of note, in the setting of presenting  hyperkalemia, will hold him empagliflozin for now.  Plan: accuchecks QAC and HS with low dose SSI.  Hold home empagliflozin for now, as above.  Add on hemoglobin A1c level.                    #) Essential Hypertension: documented h/o such, with outpatient antihypertensive regimen including metoprolol succinate, Imdur, Entresto, spironolactone.  Additionally, he is noted to be on Flomax as an outpatient. SBP's in the ED today: 90s to low 100s mmHg, which complicates plan for IV diuresis efforts in the context of his presenting acute on chronic systolic/diastolic heart failure, as further detailed above.  Consequently, for now, and in the setting of hyperkalemia, acute kidney injury, will hold home Entresto, spironolactone as well as Imdur and Flomax, with close monitoring of ensuing blood pressure trend as outlined below.  Plan: Close monitoring of subsequent BP via routine VS. continue outpatient metoprolol succinate, while holding home Entresto, Imdur, spironolactone, and Flomax.  Monitor strict I's and O's and daily weights.                   #) GERD: documented h/o such; on omeprazole as outpatient.   Plan: continue home PPI.                   #) Benign Prostatic Hyperplasia:  documented h/o such; on tamsulosin as outpatient.  However, in the setting of presenting borderline hypotension with plan for IV diuresis, will hold him tamsulosin for now.  Plan: monitor strict I's & O's and daily weights. Repeat CMP in AM.  Hold home tamsulosin for now, as above.      DVT prophylaxis: SCD's plus continuation of outpatient Xarelto Code Status: Full code Family Communication: none Disposition Plan: Per Rounding Team Consults called: EDP discussed with on-call cardiology, Dr. Tessa Lerner, Who will formally consult and guide diuresis efforts, as further detailed above;  Admission status: Inpatient     I SPENT GREATER THAN 75  MINUTES IN CLINICAL  CARE TIME/MEDICAL DECISION-MAKING IN COMPLETING THIS ADMISSION.      Chaney Born Jalasia Eskridge DO [facility]  From 7PM - 7AM   05/22/2023, 8:14 PM

## 2023-05-22 NOTE — ED Provider Notes (Signed)
[place] EMERGENCY DEPARTMENT AT [facility] Provider Note   CSN: [id number] Arrival date & time: 05/22/23  1445     History  Chief Complaint  Patient presents with   Shortness of Breath    Jose Patel is a 89 y.o. male.  Pt is a 89 yo male with pmhx significant for chf, cad, htn, CVA, afib (on Xarelto), DM2, and hx neck cancer.  Pt has been having increasing sob for the past 2 weeks.  He's been going to his cardiologist and they have been increasing his diuretics.  He is still sob.  He went yesterday and CXR showed pulmonary edema and worsening renal function.  He was told to come to the ED.  He denies f/c.  No cough.       Home Medications Prior to Admission medications   Medication Sig Start Date End Date Taking? Authorizing Provider  amiodarone (PACERONE) 100 MG tablet Take 1 tablet (100 mg total) by mouth daily. 02/21/23   Croitoru, Mihai, MD  Cholecalciferol (VITAMIN D) 125 MCG (5000 UT) CAPS Take 5,000 Units by mouth daily at 6 (six) AM. 11/12/22   [provider]  empagliflozin (JARDIANCE) 10 MG TABS tablet Take 1 tablet (10 mg total) by mouth daily before breakfast. 09/25/22   Croitoru, Rachelle Hora, MD  isosorbide mononitrate (IMDUR) 30 MG 24 hr tablet Take 0.5 tablets (15 mg total) by mouth daily. Patient taking differently: Take 30 mg by mouth daily. 04/10/23 07/09/23  Azalee Course, PA  memantine (NAMENDA) 10 MG tablet TAKE 1 TABLET BY MOUTH TWICE A DAY 05/22/23   [name], NP  metoprolol succinate (TOPROL-XL) 50 MG 24 hr tablet TAKE 1 TABLET BY MOUTH EVERY DAY 05/22/23   Croitoru, Mihai, MD  omeprazole (PRILOSEC) 20 MG capsule Take 1 capsule (20 mg total) by mouth daily. 04/12/23   [name], NP  potassium chloride (KLOR-CON) 10 MEQ tablet TAKE 1 TABLET BY MOUTH EVERY DAY 05/22/23   [name], NP  rivaroxaban (XARELTO) 20 MG TABS tablet TAKE ONE TABLET BY MOUTH ONE TIME DAILY WITH SUPPER 03/07/23   [name], NP   sacubitril-valsartan (ENTRESTO) 49-51 MG Take 1 tablet by mouth 2 (two) times daily. 09/26/22   Croitoru, Mihai, MD  spironolactone (ALDACTONE) 25 MG tablet Take 0.5 tablets (12.5 mg total) by mouth daily. 02/21/23   Croitoru, Mihai, MD  tamsulosin (FLOMAX) 0.4 MG CAPS capsule Take 1 capsule (0.4 mg total) by mouth daily. 02/21/23   [name], NP  torsemide (DEMADEX) 10 MG tablet Take 10 mg every Monday and Thursday in the morning Patient taking differently: Take 20 mg by mouth daily. Take 20 mg every Monday and Thursday in the morning 02/21/23   Croitoru, Mihai, MD      Allergies    Aricept [donepezil], Tropicamide, and Adhesive [tape]    Review of Systems   Review of Systems  Respiratory:  Positive for shortness of breath.   All other systems reviewed and are negative.   Physical Exam Updated Vital Signs BP 103/65   Pulse (!) 59   Temp 97.9 F (36.6 C) (Oral)   Resp 20   Ht 5\' 10"  (1.778 m)   Wt 68.1 kg   SpO2 100%   BMI 21.55 kg/m  Physical Exam Vitals and nursing note reviewed.  Constitutional:      Appearance: He is well-developed.  HENT:     Head: Normocephalic and atraumatic.     Mouth/Throat:  Mouth: Mucous membranes are moist.     Pharynx: Oropharynx is clear.  Eyes:     Extraocular Movements: Extraocular movements intact.     Pupils: Pupils are equal, round, and reactive to light.  Cardiovascular:     Rate and Rhythm: Normal rate and regular rhythm.  Pulmonary:     Effort: Pulmonary effort is normal.     Breath sounds: Rhonchi present.  Abdominal:     General: Bowel sounds are normal.     Palpations: Abdomen is soft.  Musculoskeletal:        General: Normal range of motion.     Cervical back: Normal range of motion and neck supple.  Skin:    General: Skin is warm.     Capillary Refill: Capillary refill takes less than 2 seconds.  Neurological:     General: No focal deficit present.     Mental Status: He is alert and oriented to person,  place, and time.  Psychiatric:        Mood and Affect: Mood normal.        Behavior: Behavior normal.     ED Results / Procedures / Treatments   Labs (all labs ordered are listed, but only abnormal results are displayed) Labs Reviewed  BASIC METABOLIC PANEL - Abnormal; Notable for the following components:      Result Value   Potassium 5.7 (*)    CO2 19 (*)    Glucose, Bld 100 (*)    BUN 39 (*)    Creatinine, Ser 1.99 (*)    Calcium 8.3 (*)    GFR, Estimated 32 (*)    All other components within normal limits  CBC WITH DIFFERENTIAL/PLATELET - Abnormal; Notable for the following components:   RBC 3.60 (*)    Hemoglobin 11.5 (*)    HCT 37.8 (*)    MCV 105.0 (*)    RDW 15.9 (*)    All other components within normal limits  BRAIN NATRIURETIC PEPTIDE - Abnormal; Notable for the following components:   B Natriuretic Peptide >4,500.0 (*)    All other components within normal limits  TROPONIN I (HIGH SENSITIVITY) - Abnormal; Notable for the following components:   Troponin I (High Sensitivity) 199 (*)    All other components within normal limits  TROPONIN I (HIGH SENSITIVITY) - Abnormal; Notable for the following components:   Troponin I (High Sensitivity) 231 (*)    All other components within normal limits  URINALYSIS, ROUTINE W REFLEX MICROSCOPIC  CBC WITH DIFFERENTIAL/PLATELET  COMPREHENSIVE METABOLIC PANEL  MAGNESIUM  MAGNESIUM  PHOSPHORUS  LACTIC ACID, PLASMA  LACTIC ACID, PLASMA  HEPATIC FUNCTION PANEL    EKG EKG Interpretation Date/Time:  Tuesday May 22 2023 15:02:47 EDT Ventricular Rate:  67 PR Interval:  360 QRS Duration:  120 QT Interval:  462 QTC Calculation: 488 R Axis:   -13  Text Interpretation: Atrial-paced rhythm with prolonged AV conduction Minimal voltage criteria for LVH, may be normal variant ( Cornell product ) Cannot rule out Anterior infarct , age undetermined ST & T wave abnormality, consider lateral ischemia Abnormal ECG When compared  with ECG of 19-Mar-2023 23:29, PREVIOUS ECG IS PRESENT No significant change since last tracing Confirmed by Jacalyn Lefevre [phone]) on 05/22/2023 3:21:52 PM  Radiology DG Chest Port 1 View  Result Date: 05/22/2023 CLINICAL DATA:  Shortness of breath EXAM: PORTABLE CHEST 1 VIEW COMPARISON:  Chest x-ray 05/21/2023 FINDINGS: Left-sided pacemaker again seen. The heart is enlarged, unchanged. There are small pleural effusions.  There central pulmonary vascular congestion. There is atelectasis in the left lung base. There is no pneumothorax or acute fracture. IMPRESSION: Cardiomegaly with small pleural effusions and central pulmonary vascular congestion. Electronically Signed   By: Darliss Cheney M.D.   On: 05/22/2023 17:46   DG Chest 2 View  Result Date: 05/21/2023 CLINICAL DATA:  Shortness of breath. Diastolic congestive heart failure. EXAM: CHEST - 2 VIEW COMPARISON:  Radiograph 03/19/2023 FINDINGS: The heart is enlarged. Left-sided pacemaker in place. There are small bilateral pleural effusions. Vascular congestion with subtle septal thickening typical of edema. No confluent consolidation. No pneumothorax. IMPRESSION: Cardiomegaly with small pleural effusions and pulmonary edema consistent with CHF. Electronically Signed   By: Narda Rutherford M.D.   On: 05/21/2023 18:22    Procedures Procedures    Medications Ordered in ED Medications  sodium zirconium cyclosilicate (LOKELMA) packet 5 g (has no administration in time range)  acetaminophen (TYLENOL) tablet 650 mg (has no administration in time range)    Or  acetaminophen (TYLENOL) suppository 650 mg (has no administration in time range)  melatonin tablet 3 mg (has no administration in time range)  ondansetron (ZOFRAN) injection 4 mg (has no administration in time range)  furosemide (LASIX) injection 40 mg (has no administration in time range)    ED Course/ Medical Decision Making/ A&P                                 Medical Decision  Making Amount and/or Complexity of Data Reviewed Labs: ordered. Radiology: ordered.  Risk Decision regarding hospitalization.   This patient presents to the ED for concern of sob, this involves an extensive number of treatment options, and is a complaint that carries with it a high risk of complications and morbidity.  The differential diagnosis includes chf exac, cad, anemia, electrolyte abn   Co morbidities that complicate the patient evaluation   chf, cad, htn, CVA, afib (on Xarelto), DM2, and hx neck cancer.   Additional history obtained:  Additional history obtained from epic chart review External records from outside source obtained and reviewed including daughter   Lab Tests:  I Ordered, and personally interpreted labs.  The pertinent results include:  cbc with hgb 11.5 (hgb 11.9 on 10/21); bnp >4500; trop   Imaging Studies ordered:  I ordered imaging studies including cxr  I independently visualized and interpreted imaging which showed  Cardiomegaly with small pleural effusions and central pulmonary  vascular congestion.   I agree with the radiologist interpretation   Cardiac Monitoring:  The patient was maintained on a cardiac monitor.  I personally viewed and interpreted the cardiac monitored which showed an underlying rhythm of: nsr   Medicines ordered and prescription drug management:  I ordered medication including lokelma  for hyperkalemia  Reevaluation of the patient after these medicines showed that the patient improved I have reviewed the patients home medicines and have made adjustments as needed   Test Considered:  echo   Consultations Obtained:  I requested consultation with the cardiologist,  and discussed lab and imaging findings as well as pertinent plan - they will consult.  They request medicine admission. Pt d/w Dr. Arlean Hopping ([facility]) for admission.   Problem List / ED Course:  CHF with hypotension and aki:  cardiology recommends  lasix.  They also ordered an ECHO. AKI:  likely due to increasing diuretic use Hyperkalemia:  lokelma given.  No EKG changes. Elevated trop:  likely due to work of breathing.  No cp.   Reevaluation:  After the interventions noted above, I reevaluated the patient and found that they have :improved   Social Determinants of Health:  Lives at home   Dispostion:  After consideration of the diagnostic results and the patients response to treatment, I feel that the patent would benefit from admission.    CRITICAL CARE Performed by: Jacalyn Lefevre   Total critical care time: 30 minutes  Critical care time was exclusive of separately billable procedures and treating other patients.  Critical care was necessary to treat or prevent imminent or life-threatening deterioration.  Critical care was time spent personally by me on the following activities: development of treatment plan with patient and/or surrogate as well as nursing, discussions with consultants, evaluation of patient's response to treatment, examination of patient, obtaining history from patient or surrogate, ordering and performing treatments and interventions, ordering and review of laboratory studies, ordering and review of radiographic studies, pulse oximetry and re-evaluation of patient's condition.         Final Clinical Impression(s) / ED Diagnoses Final diagnoses:  Acute on chronic congestive heart failure, unspecified heart failure type (HCC)  AKI (acute kidney injury) (HCC)  Hyperkalemia  Elevated troponin    Rx / DC Orders ED Discharge Orders     None         Jacalyn Lefevre, MD 05/22/23 2003

## 2023-05-22 NOTE — Telephone Encounter (Signed)
Daughter returned call to office daughter requesting a direct admit . Will return call to advise for patient to go directly to ED.

## 2023-05-22 NOTE — ED Notes (Signed)
ED TO INPATIENT HANDOFF REPORT  ED Nurse Name and Phone #: Rodney Booze [phone]  S Name/Age/Gender Jose Patel 89 y.o. male Room/Bed: 033C/033C  Code Status   Code Status: Full Code  Home/SNF/Other Home Patient oriented to: self, place, time, and situation Is this baseline? Yes   Triage Complete: Triage complete  Chief Complaint Acute on chronic systolic heart failure (HCC) [I50.23]  Triage Note Pt with CHF flare ongoing 2 weeks. [facility]itioner at [facility] directed pt to come to ED for IV medications. Pt with mild swelling in L arm and SOB. Denies chest pain at this time, pt with chest pain last week. Pt speech quiet, pt reports difficulty speaking due to SOB.    Allergies Allergies  Allergen Reactions   Aricept [Donepezil] Diarrhea, Nausea And Vomiting and Other (See Comments)    Cannot tolerate- bradycardia, also   Tropicamide Nausea And Vomiting    Used to dilate eyes (might be this- was switched to something this was tolerable, after this)   Adhesive [Tape] Rash    Level of Care/Admitting Diagnosis ED Disposition     ED Disposition  Admit   Condition  --   Comment  Hospital [facility]: [facility] [[id number]]  Level of Care: Progressive [102]  Admit to Progressive based on following criteria: MULTISYSTEM THREATS such as stable sepsis, metabolic/electrolyte imbalance with or without encephalopathy that is responding to early treatment.  May admit patient to [facility] if equivalent level of care is available:: No  Covid Evaluation: Asymptomatic - no recent exposure (last 10 days) testing not required  Diagnosis: Acute on chronic systolic heart failure (HCC) [428.23.ICD-9-CM]  Admitting Physician: Angie Fava [[id number]]  Attending Physician: Angie Fava [[id number]]  Certification:: I certify this patient will need inpatient services for at least 2 midnights  Expected Medical Readiness: 05/24/2023           B Medical/Surgery History Past Medical History:  Diagnosis Date   A-fib (HCC)    Anticoagulated on Coumadin    Per records from [facility]    Aortic regurgitation    Aortic stenosis    CHF (congestive heart failure) (HCC)    Coronary artery disease    COVID    CVA (cerebral vascular accident) (HCC)    hx of CVA noted on CT from 02/19/18   Diabetes mellitus, type 2 (HCC)    Gastro-esophageal reflux disease without esophagitis    Per records from previous provider, [facility]   Heart attack [facility])    History of CT scan of head 02/19/2018   Per records from previous provider, [facility]. Chronic changes small vessel disease   History of ECG    03/26/14- Sinus Tachycardia @ 107 bmp, QRS 78 msec, QT 298 msec, QTc 361 msec. 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 [facility])    Per records from previous provider, [facility]    MI (myocardial infarction) [facility])    Per records from [facility]    Mitral regurgitation    Mitral stenosis    Other intervertebral disc degeneration, lumbar region    Per records from previous provider, [facility]   Radiculopathy, lumbar region    Per records from previous provider,  [facility]   Sinus bradycardia    Per records from [facility]    Sleep apnea    Transient ischemic attack    10 to 12 years ago   Typical atrial flutter (HCC)    Per records from [facility]    Past Surgical History:  Procedure Laterality Date   CARDIAC ELECTROPHYSIOLOGY STUDY AND ABLATION     CARDIOVERSION N/A 12/26/2018   Procedure: CARDIOVERSION;  Surgeon: Chrystie Nose, MD;  Location: [facility] ENDOSCOPY;  Service: Cardiovascular;   Laterality: N/A;   CARDIOVERSION N/A 05/14/2019   Procedure: CARDIOVERSION;  Surgeon: Thurmon Fair, MD;  Location: [facility] ENDOSCOPY;  Service: Cardiovascular;  Laterality: N/A;   CATARACT EXTRACTION     Debridement to right arm with MRSA infection     4 years ago   GALLBLADDER SURGERY     Per records from [facility]    PACEMAKER IMPLANT N/A 07/07/2019   Procedure: PACEMAKER IMPLANT;  Surgeon: Thurmon Fair, MD;  Location: [facility] INVASIVE CV LAB;  Service: Cardiovascular;  Laterality: N/A;   TEE WITHOUT CARDIOVERSION N/A 12/26/2018   Procedure: TRANSESOPHAGEAL ECHOCARDIOGRAM (TEE);  Surgeon: Chrystie Nose, MD;  Location: [facility] ENDOSCOPY;  Service: Cardiovascular;  Laterality: N/A;     A IV Location/Drains/Wounds Patient Lines/Drains/Airways Status     Active Line/Drains/Airways     Name Placement date Placement time Site Days   Peripheral IV 05/22/23 20 G Anterior;Distal;Right;Upper Arm 05/22/23  1559  Arm  less than 1            Intake/Output Last 24 hours No intake or output data in the 24 hours ending 05/22/23 1943  Labs/Imaging Results for orders placed or performed during the hospital encounter of 05/22/23 (from the past 48 hour(s))  Basic metabolic panel     Status: Abnormal   Collection Time: 05/22/23  3:34 PM  Result Value Ref Range   Sodium 140 135 - 145 mmol/L   Potassium 5.7 (H) 3.5 - 5.1 mmol/L    Comment: HEMOLYSIS AT THIS LEVEL MAY AFFECT RESULT   Chloride 107 98 - 111 mmol/L   CO2 19 (L) 22 - 32 mmol/L   Glucose, Bld 100 (H) 70 - 99 mg/dL    Comment: Glucose reference range applies only to samples taken after fasting for at least 8 hours.   BUN 39 (H) 8 - 23 mg/dL   Creatinine, Ser 9.14 (H) 0.61 - 1.24 mg/dL   Calcium 8.3 (L) 8.9 - 10.3 mg/dL   GFR, Estimated 32 (L) >60 mL/min    Comment: (NOTE) Calculated using the CKD-EPI Creatinine Equation (2021)    Anion gap 14 5 - 15    Comment: Performed at [facility], [address], [place], [place]  [address]  CBC with Differential     Status: Abnormal   Collection Time: 05/22/23  3:34 PM  Result Value Ref Range   WBC 7.4 4.0 - 10.5 K/uL   RBC 3.60 (L) 4.22 - 5.81 MIL/uL   Hemoglobin 11.5 (L) 13.0 - 17.0 g/dL   HCT 62.1 (L) 30.8 - 65.7 %   MCV 105.0 (H) 80.0 - 100.0 fL   MCH 31.9 26.0 - 34.0 pg   MCHC 30.4 30.0 - 36.0 g/dL   RDW 84.6 (H) 96.2 - 95.2 %   Platelets 187 150 - 400 K/uL   nRBC 0.0 0.0 - 0.2 %   Neutrophils Relative % 73 %   Neutro Abs 5.3 1.7 - 7.7 K/uL  Lymphocytes Relative 20 %   Lymphs Abs 1.5 0.7 - 4.0 K/uL   Monocytes Relative 5 %   Monocytes Absolute 0.4 0.1 - 1.0 K/uL   Eosinophils Relative 2 %   Eosinophils Absolute 0.2 0.0 - 0.5 K/uL   Basophils Relative 0 %   Basophils Absolute 0.0 0.0 - 0.1 K/uL   Immature Granulocytes 0 %   Abs Immature Granulocytes 0.03 0.00 - 0.07 K/uL    Comment: Performed at [facility], [address], [place], [place] [address]  Brain natriuretic peptide     Status: Abnormal   Collection Time: 05/22/23  3:34 PM  Result Value Ref Range   B Natriuretic Peptide >4,500.0 (H) 0.0 - 100.0 pg/mL    Comment: Performed at [facility] Lab, [address] [place], [place] [address]  Troponin I (High Sensitivity)     Status: Abnormal   Collection Time: 05/22/23  3:34 PM  Result Value Ref Range   Troponin I (High Sensitivity) 199 (HH) <18 ng/L    Comment: CRITICAL RESULT CALLED TO, READ BACK BY AND VERIFIED WITH T MORRISON RN 05/22/2023 1809 BNUNNERY (NOTE) Elevated high sensitivity troponin I (hsTnI) values and significant  changes across serial measurements may suggest ACS but many other  chronic and acute conditions are known to elevate hsTnI results.  Refer to the "Links" section for chest pain algorithms and additional  guidance. Performed at [facility], [address], [place], [place] [address]   Troponin I (High Sensitivity)     Status: Abnormal   Collection Time: 05/22/23  5:33 PM  Result Value Ref Range    Troponin I (High Sensitivity) 231 (HH) <18 ng/L    Comment: CRITICAL VALUE NOTED. VALUE IS CONSISTENT WITH PREVIOUSLY REPORTED/CALLED VALUE (NOTE) Elevated high sensitivity troponin I (hsTnI) values and significant  changes across serial measurements may suggest ACS but many other  chronic and acute conditions are known to elevate hsTnI results.  Refer to the "Links" section for chest pain algorithms and additional  guidance. Performed at [facility], [address], [place], [place] [address]    DG Chest Port 1 View  Result Date: 05/22/2023 CLINICAL DATA:  Shortness of breath EXAM: PORTABLE CHEST 1 VIEW COMPARISON:  Chest x-ray 05/21/2023 FINDINGS: Left-sided pacemaker again seen. The heart is enlarged, unchanged. There are small pleural effusions. There central pulmonary vascular congestion. There is atelectasis in the left lung base. There is no pneumothorax or acute fracture. IMPRESSION: Cardiomegaly with small pleural effusions and central pulmonary vascular congestion. Electronically Signed   By: Darliss Cheney M.D.   On: 05/22/2023 17:46   DG Chest 2 View  Result Date: 05/21/2023 CLINICAL DATA:  Shortness of breath. Diastolic congestive heart failure. EXAM: CHEST - 2 VIEW COMPARISON:  Radiograph 03/19/2023 FINDINGS: The heart is enlarged. Left-sided pacemaker in place. There are small bilateral pleural effusions. Vascular congestion with subtle septal thickening typical of edema. No confluent consolidation. No pneumothorax. IMPRESSION: Cardiomegaly with small pleural effusions and pulmonary edema consistent with CHF. Electronically Signed   By: Narda Rutherford M.D.   On: 05/21/2023 18:22    Pending Labs Unresulted Labs (From admission, onward)     Start     Ordered   05/23/23 0500  CBC with Differential/Platelet  Tomorrow morning,   R        05/22/23 1940   05/23/23 0500  Comprehensive metabolic panel  Tomorrow morning,   R  05/22/23 1940   05/23/23 0500   Magnesium  Tomorrow morning,   R        05/22/23 1940   05/23/23 0500  Phosphorus  Tomorrow morning,   R        05/22/23 1940   05/22/23 1940  Magnesium  Add-on,   AD        05/22/23 1940   05/22/23 1518  Urinalysis, Routine w reflex microscopic -Urine, Clean Catch  Once,   URGENT       Question:  Specimen Source  Answer:  Urine, Clean Catch   05/22/23 1517            Vitals/Pain Today's Vitals   05/22/23 1620 05/22/23 1845 05/22/23 1900 05/22/23 1915  BP:  (!) 97/58 (!) 99/56 103/65  Pulse:  60 (!) 59 (!) 59  Resp:  17 10 20   Temp: 97.9 F (36.6 C)     TempSrc: Oral     SpO2:  95% 99% 100%  Weight:      Height:      PainSc:        Isolation Precautions No active isolations  Medications Medications  sodium zirconium cyclosilicate (LOKELMA) packet 5 g (has no administration in time range)  acetaminophen (TYLENOL) tablet 650 mg (has no administration in time range)    Or  acetaminophen (TYLENOL) suppository 650 mg (has no administration in time range)  melatonin tablet 3 mg (has no administration in time range)  ondansetron (ZOFRAN) injection 4 mg (has no administration in time range)    Mobility walks     Focused Assessments Cardiac Assessment Handoff:  Cardiac Rhythm: Normal sinus rhythm No results found for: "CKTOTAL", "CKMB", "CKMBINDEX", "TROPONINI" Lab Results  Component Value Date   DDIMER <0.27 03/27/2021   Does the Patient currently have chest pain? No    R Recommendations: See Admitting Provider Note  Report given to:   Additional Notes:

## 2023-05-22 NOTE — Telephone Encounter (Signed)
High Risk Warning Populated when attempting to refill, I will send to Provider for further review 

## 2023-05-22 NOTE — ED Triage Notes (Signed)
Pt with CHF flare ongoing 2 weeks. [facility] at [facility] directed pt to come to ED for IV medications. Pt with mild swelling in L arm and SOB. Denies chest pain at this time, pt with chest pain last week. Pt speech quiet, pt reports difficulty speaking due to SOB.

## 2023-05-23 ENCOUNTER — Inpatient Hospital Stay (HOSPITAL_COMMUNITY): Payer: Medicare HMO

## 2023-05-23 DIAGNOSIS — I5043 Acute on chronic combined systolic (congestive) and diastolic (congestive) heart failure: Secondary | ICD-10-CM | POA: Diagnosis not present

## 2023-05-23 DIAGNOSIS — I5041 Acute combined systolic (congestive) and diastolic (congestive) heart failure: Secondary | ICD-10-CM

## 2023-05-23 LAB — ECHOCARDIOGRAM COMPLETE
AR max vel: 2.56 cm2
AV Area VTI: 2.46 cm2
AV Area mean vel: 2.35 cm2
AV Mean grad: 3 mm[Hg]
AV Peak grad: 5.4 mm[Hg]
AV Vena cont: 0.85 cm
Ao pk vel: 1.17 m/s
Area-P 1/2: 3.85 cm2
Calc EF: 21.1 %
Height: 70 in
MV M vel: 3.97 m/s
MV Peak grad: 63 mm[Hg]
P 1/2 time: 406 ms
S' Lateral: 5.1 cm
Single Plane A2C EF: 29.1 %
Single Plane A4C EF: 10.8 %
Weight: 2406.4 [oz_av]

## 2023-05-23 LAB — CBC WITH DIFFERENTIAL/PLATELET
Abs Immature Granulocytes: 0.03 10*3/uL (ref 0.00–0.07)
Basophils Absolute: 0 10*3/uL (ref 0.0–0.1)
Basophils Relative: 0 %
Eosinophils Absolute: 0.3 10*3/uL (ref 0.0–0.5)
Eosinophils Relative: 3 %
HCT: 33.4 % — ABNORMAL LOW (ref 39.0–52.0)
Hemoglobin: 10.9 g/dL — ABNORMAL LOW (ref 13.0–17.0)
Immature Granulocytes: 0 %
Lymphocytes Relative: 18 %
Lymphs Abs: 1.3 10*3/uL (ref 0.7–4.0)
MCH: 32 pg (ref 26.0–34.0)
MCHC: 32.6 g/dL (ref 30.0–36.0)
MCV: 97.9 fL (ref 80.0–100.0)
Monocytes Absolute: 0.4 10*3/uL (ref 0.1–1.0)
Monocytes Relative: 5 %
Neutro Abs: 5.3 10*3/uL (ref 1.7–7.7)
Neutrophils Relative %: 74 %
Platelets: 202 10*3/uL (ref 150–400)
RBC: 3.41 MIL/uL — ABNORMAL LOW (ref 4.22–5.81)
RDW: 15.6 % — ABNORMAL HIGH (ref 11.5–15.5)
WBC: 7.3 10*3/uL (ref 4.0–10.5)
nRBC: 0 % (ref 0.0–0.2)

## 2023-05-23 LAB — PROTIME-INR
INR: 2.5 — ABNORMAL HIGH (ref 0.8–1.2)
Prothrombin Time: 26.9 s — ABNORMAL HIGH (ref 11.4–15.2)

## 2023-05-23 LAB — COMPREHENSIVE METABOLIC PANEL
ALT: 16 U/L (ref 0–44)
AST: 18 U/L (ref 15–41)
Albumin: 2.6 g/dL — ABNORMAL LOW (ref 3.5–5.0)
Alkaline Phosphatase: 176 U/L — ABNORMAL HIGH (ref 38–126)
Anion gap: 10 (ref 5–15)
BUN: 36 mg/dL — ABNORMAL HIGH (ref 8–23)
CO2: 22 mmol/L (ref 22–32)
Calcium: 8.1 mg/dL — ABNORMAL LOW (ref 8.9–10.3)
Chloride: 105 mmol/L (ref 98–111)
Creatinine, Ser: 2.08 mg/dL — ABNORMAL HIGH (ref 0.61–1.24)
GFR, Estimated: 30 mL/min — ABNORMAL LOW (ref >60)
Glucose, Bld: 111 mg/dL — ABNORMAL HIGH (ref 70–99)
Potassium: 4.1 mmol/L (ref 3.5–5.1)
Sodium: 137 mmol/L (ref 135–145)
Total Bilirubin: 1.1 mg/dL (ref 0.3–1.2)
Total Protein: 5.7 g/dL — ABNORMAL LOW (ref 6.5–8.1)

## 2023-05-23 LAB — HEPATIC FUNCTION PANEL
ALT: 15 U/L (ref 0–44)
ALT: 18 U/L (ref 0–44)
AST: 18 U/L (ref 15–41)
AST: 22 U/L (ref 15–41)
Albumin: 2.6 g/dL — ABNORMAL LOW (ref 3.5–5.0)
Albumin: 2.9 g/dL — ABNORMAL LOW (ref 3.5–5.0)
Alkaline Phosphatase: 177 U/L — ABNORMAL HIGH (ref 38–126)
Alkaline Phosphatase: 199 U/L — ABNORMAL HIGH (ref 38–126)
Bilirubin, Direct: 0.4 mg/dL — ABNORMAL HIGH (ref 0.0–0.2)
Bilirubin, Direct: 0.4 mg/dL — ABNORMAL HIGH (ref 0.0–0.2)
Indirect Bilirubin: 0.6 mg/dL (ref 0.3–0.9)
Indirect Bilirubin: 0.6 mg/dL (ref 0.3–0.9)
Total Bilirubin: 1 mg/dL (ref 0.3–1.2)
Total Bilirubin: 1 mg/dL (ref 0.3–1.2)
Total Protein: 5.6 g/dL — ABNORMAL LOW (ref 6.5–8.1)
Total Protein: 6.3 g/dL — ABNORMAL LOW (ref 6.5–8.1)

## 2023-05-23 LAB — RETICULOCYTES
Immature Retic Fract: 10.2 % (ref 2.3–15.9)
RBC.: 3.67 MIL/uL — ABNORMAL LOW (ref 4.22–5.81)
Retic Count, Absolute: 40.4 10*3/uL (ref 19.0–186.0)
Retic Ct Pct: 1.1 % (ref 0.4–3.1)

## 2023-05-23 LAB — GLUCOSE, CAPILLARY
Glucose-Capillary: 128 mg/dL — ABNORMAL HIGH (ref 70–99)
Glucose-Capillary: 140 mg/dL — ABNORMAL HIGH (ref 70–99)
Glucose-Capillary: 135 mg/dL — ABNORMAL HIGH (ref 70–99)
Glucose-Capillary: 160 mg/dL — ABNORMAL HIGH (ref 70–99)

## 2023-05-23 LAB — MAGNESIUM
Magnesium: 2.7 mg/dL — ABNORMAL HIGH (ref 1.7–2.4)
Magnesium: 2.7 mg/dL — ABNORMAL HIGH (ref 1.7–2.4)

## 2023-05-23 LAB — LACTIC ACID, PLASMA: Lactic Acid, Venous: 1.8 mmol/L (ref 0.5–1.9)

## 2023-05-23 LAB — FOLATE: Folate: 12.5 ng/mL (ref >5.9)

## 2023-05-23 LAB — IRON AND TIBC
Iron: 28 ug/dL — ABNORMAL LOW (ref 45–182)
Saturation Ratios: 9 % — ABNORMAL LOW (ref 17.9–39.5)
TIBC: 312 ug/dL (ref 250–450)
UIBC: 284 ug/dL

## 2023-05-23 LAB — PHOSPHORUS: Phosphorus: 4 mg/dL (ref 2.5–4.6)

## 2023-05-23 LAB — TROPONIN I (HIGH SENSITIVITY): Troponin I (High Sensitivity): 183 ng/L (ref <18)

## 2023-05-23 LAB — HEMOGLOBIN A1C
Hgb A1c MFr Bld: 6.1 % — ABNORMAL HIGH (ref 4.8–5.6)
Mean Plasma Glucose: 128.37 mg/dL

## 2023-05-23 LAB — VITAMIN B12: Vitamin B-12: 516 pg/mL (ref 180–914)

## 2023-05-23 LAB — FERRITIN: Ferritin: 40 ng/mL (ref 24–336)

## 2023-05-23 MED ORDER — PERFLUTREN LIPID MICROSPHERE
1.0000 mL | INTRAVENOUS | Status: AC | PRN
  Administered 2023-05-23: 4 mL via INTRAVENOUS

## 2023-05-23 MED ORDER — METOPROLOL SUCCINATE ER 25 MG PO TB24
25.0000 mg | ORAL_TABLET | Freq: Every day | ORAL | Status: DC
  Administered 2023-05-24: 25 mg via ORAL
  Filled 2023-05-23: qty 1

## 2023-05-23 NOTE — Evaluation (Signed)
Physical Therapy Evaluation Patient Details Name: Jose Patel MRN: [id number] DOB: 1933/10/30 Today's Date: 05/23/2023  History of Present Illness  89 y/o male presents to [facility] hospital on 05/22/2023 with SOB, admitted for management of acute on chronic CHF. PMH: HTN, CHF, DM type 2, Afib, hx of sinus node dysfunction s/p pacemaker placement, aortic insufficiency, sleep apnea, hx of stroke  Clinical Impression  Pt presents to PT with deficits in activity tolerance, strength, power, cardiopulmonary function. Pt's activity tolerance has been limited to short bouts of ambulation within the home recently. Pt is able mobilize for similar distances at this time. PT provides verbal cues to improve safety during transfers, reducing distance between destination prior to releasing walker. Pt will benefit from continued frequent mobilization in an effort to maintain mobility quality and activity tolerance. Pt and family report a transition toward comfort measures is planned after discharge. Pt does not require post-acute PT services.        If plan is discharge home, recommend the following: A little help with bathing/dressing/bathroom;Assistance with cooking/housework;Assist for transportation;Help with stairs or ramp for entrance   Can travel by private vehicle        Equipment Recommendations None recommended by PT  Recommendations for Other Services       Functional Status Assessment Patient has had a recent decline in their functional status and demonstrates the ability to make significant improvements in function in a reasonable and predictable amount of time.     Precautions / Restrictions Precautions Precautions: Fall Restrictions Weight Bearing Restrictions: No      Mobility  Bed Mobility Overal bed mobility: Needs Assistance Bed Mobility: Rolling, Sidelying to Sit Rolling: Supervision Sidelying to sit: Supervision       General bed mobility comments: verbal cues     Transfers Overall transfer level: Needs assistance Equipment used: Rollator (4 wheels) Transfers: Sit to/from Stand Sit to Stand: Supervision                Ambulation/Gait Ambulation/Gait assistance: Contact guard assist Gait Distance (Feet): 20 Feet (additional trial of 15') Assistive device: Rollator (4 wheels) Gait Pattern/deviations: Step-through pattern, Trunk flexed Gait velocity: reduced Gait velocity interpretation: <1.8 ft/sec, indicate of risk for recurrent falls   General Gait Details: pt with slowed step-through gait, increased trunk flexion over Rollator  Stairs            Wheelchair Mobility     Tilt Bed    Modified Rankin (Stroke Patients Only)       Balance Overall balance assessment: Needs assistance Sitting-balance support: No upper extremity supported, Feet supported Sitting balance-Leahy Scale: Good     Standing balance support: Bilateral upper extremity supported, Reliant on assistive device for balance Standing balance-Leahy Scale: Poor                               Pertinent Vitals/Pain Pain Assessment Pain Assessment: No/denies pain    Home Living Family/patient expects to be discharged to:: Private residence Living Arrangements: Children (lives on daughters property in a small home behind the main house) Available Help at Discharge: Family;Available PRN/intermittently Type of Home: House Home Access: Level entry       Home Layout: One level Home Equipment: Rollator (4 wheels);Transport chair;Lift chair      Prior Function Prior Level of Function : Needs assist             Mobility Comments: ambulates for very short household  distances with rollator, often scooting around on rollator recently. ADLs Comments: assist for bathing, dressing, IADLs     Extremity/Trunk Assessment   Upper Extremity Assessment Upper Extremity Assessment: Generalized weakness    Lower Extremity Assessment Lower  Extremity Assessment: Generalized weakness    Cervical / Trunk Assessment Cervical / Trunk Assessment: Kyphotic  Communication   Communication Communication: No apparent difficulties (soft spoken) Cueing Techniques: Verbal cues  Cognition Arousal: Alert Behavior During Therapy: WFL for tasks assessed/performed Overall Cognitive Status: Within Functional Limits for tasks assessed                                          General Comments General comments (skin integrity, edema, etc.): VSS on RA, sats on high 90s when mobilizing    Exercises     Assessment/Plan    PT Assessment Patient needs continued PT services  PT Problem List Decreased strength;Decreased activity tolerance;Decreased balance;Decreased mobility;Decreased knowledge of use of DME;Decreased safety awareness;Cardiopulmonary status limiting activity       PT Treatment Interventions DME instruction;Gait training;Functional mobility training;Therapeutic activities;Therapeutic exercise;Balance training;Neuromuscular re-education;Patient/family education    PT Goals (Current goals can be found in the Care Plan section)  Acute Rehab PT Goals Patient Stated Goal: to return home PT Goal Formulation: With patient/family Time For Goal Achievement: 06/06/23 Potential to Achieve Goals: Good    Frequency Min 1X/week     Co-evaluation               AM-PAC PT "6 Clicks" Mobility  Outcome Measure Help needed turning from your back to your side while in a flat bed without using bedrails?: None Help needed moving from lying on your back to sitting on the side of a flat bed without using bedrails?: None Help needed moving to and from a bed to a chair (including a wheelchair)?: None Help needed standing up from a chair using your arms (e.g., wheelchair or bedside chair)?: None Help needed to walk in hospital room?: A Little Help needed climbing 3-5 steps with a railing? : Total 6 Click Score: 20     End of Session Equipment Utilized During Treatment: Gait belt Activity Tolerance: Patient limited by fatigue Patient left: with call bell/phone within reach;in chair Nurse Communication: Mobility status PT Visit Diagnosis: Other abnormalities of gait and mobility (R26.89);Muscle weakness (generalized) (M62.81)    Time: 1030-1053 PT Time Calculation (min) (ACUTE ONLY): 23 min   Charges:   PT Evaluation $PT Eval Low Complexity: 1 Low   PT General Charges $$ ACUTE PT VISIT: 1 Visit         Arlyss Gandy, PT, DPT Acute Rehabilitation Office [phone]   Arlyss Gandy 05/23/2023, 11:38 AM

## 2023-05-23 NOTE — Hospital Course (Addendum)
Jose Patel is a 89 y.o. male with a history of chronic systolic/diastolic heart failure, paroxysmal atrial fibrillation, sick sinus syndrome s/p PPM, diabetes mellitus type 2, primary hypertension, BPH.  Patient presented secondary to shortness of breath and was found to have evidence of acute heart failure. Lasix IV diuresis initiated. Cardiology consulted. Decision made to transition home with hospice.

## 2023-05-23 NOTE — Progress Notes (Deleted)
Heart Failure Navigator Progress Note  Assessed for Heart & Vascular TOC clinic readiness.  Patient does not meet criteria due to EF 60-65%, Pleural effusion requiring thoracentesis. .   Navigator will sign off at this time.   Rhae Hammock, BSN, Scientist, clinical (histocompatibility and immunogenetics) Only

## 2023-05-23 NOTE — TOC Initial Note (Signed)
Transition of Care [facility]) - Initial/Assessment Note    Patient Details  Name: Jose Patel MRN: [id number] Date of Birth: 04-Mar-1934  Transition of Care [facility]) CM/SW Contact:    Leone Haven, RN Phone Number: 05/23/2023, 4:36 PM  Clinical Narrative:                 NCM spoke with daughter, [name], who is the HPOA, she states he is From home with her , he lives in her pool house, has PCP and insurance on file, states has no HH services in place at this time , he has a walker at home. She states she is in the process of signing up with Hospice with [facility], but she is not going to do that just yet, they will be reaching out to her the first week in November.    States she will transport him  home at [employer] and she  is support system, states [facility] [place].  Pta self ambulatory with walker.   Expected Discharge Plan: Home/Self Care Barriers to Discharge: Continued Medical Work up   Patient Goals and CMS Choice Patient states their goals for this hospitalization and ongoing recovery are:: return home with daughter   Choice offered to / list presented to : NA      Expected Discharge Plan and Services In-house Referral: NA Discharge Planning Services: CM Consult Post Acute Care Choice: NA Living arrangements for the past 2 months: Single Family Home                 DME Arranged: N/A DME Agency: NA       HH Arranged: NA          Prior Living Arrangements/Services Living arrangements for the past 2 months: Single Family Home Lives with:: Adult Children Patient language and need for interpreter reviewed:: Yes Do you feel safe going back to the place where you live?: Yes      Need for Family Participation in Patient Care: Yes (Comment)   Current home services: DME (walker) Criminal Activity/Legal Involvement Pertinent to Current Situation/Hospitalization: No - Comment as needed  Activities of Daily Living   ADL Screening (condition at time of  admission) Independently performs ADLs?: No Does the patient have a NEW difficulty with bathing/dressing/toileting/self-feeding that is expected to last >3 days?: Yes (Initiates electronic notice to provider for possible OT consult) (nees help) Does the patient have a NEW difficulty with getting in/out of bed, walking, or climbing stairs that is expected to last >3 days?: Yes (Initiates electronic notice to provider for possible PT consult) (needs help) Does the patient have a NEW difficulty with communication that is expected to last >3 days?: No Is the patient deaf or have difficulty hearing?: No Does the patient have difficulty seeing, even when wearing glasses/contacts?: No Does the patient have difficulty concentrating, remembering, or making decisions?: No  Permission Sought/Granted Permission sought to share information with : Case Manager Permission granted to share information with : Yes, Verbal Permission Granted              Emotional Assessment         Alcohol / Substance Use: Not Applicable Psych Involvement: No (comment)  Admission diagnosis:  Acute on chronic systolic heart failure (HCC) [I50.23] Hyperkalemia [E87.5] Elevated troponin [R79.89] AKI (acute kidney injury) (HCC) [N17.9] Acute on chronic congestive heart failure, unspecified heart failure type [facility]) [I50.9] Patient Active Problem List   Diagnosis Date Noted   Acute on chronic  systolic heart failure (HCC) 05/22/2023   AKI (acute kidney injury) (HCC) 05/22/2023   Elevated troponin level not due myocardial infarction 05/22/2023   Hyperkalemia 05/22/2023   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) 05/22/2023   Mixed hyperlipidemia 05/22/2023   Former smoker 05/22/2023   Long term current use of antiarrhythmic drug 05/22/2023   Acute combined systolic and diastolic heart failure (HCC) 05/22/2023   Acute anemia 05/22/2023   GERD (gastroesophageal reflux disease)  05/22/2023   BPH (benign prostatic hyperplasia) 05/22/2023   Elevated troponin 03/25/2021   Weakness 03/25/2021   Acute on chronic combined systolic and diastolic CHF (congestive heart failure) (HCC) 03/24/2021   COVID-19 virus infection 03/24/2021   Atypical atrial flutter (HCC) 11/07/2019   Secondary hypercoagulable state (HCC) 11/07/2019   Cardiac pacemaker in situ 07/07/2019   Tachycardia-bradycardia syndrome (HCC)    Paroxysmal atrial flutter (HCC)    Paroxysmal A-fib (HCC) 05/14/2019   Dyslipidemia (high LDL; low HDL) 03/26/2019   SSS (sick sinus syndrome) (HCC) 03/26/2019   Fatigue 01/16/2019   Persistent atrial fibrillation (HCC)    Vitamin D deficiency 11/20/2018   Aortic valve regurgitation 07/07/2018   Sinus bradycardia 07/07/2018   Long term (current) use of anticoagulants 07/07/2018   Diastolic dysfunction 07/07/2018   Hypercholesteremia 07/07/2018   Mild dilation of ascending aorta (HCC) 07/07/2018   OSA (obstructive sleep apnea)    Essential hypertension    DM2 (diabetes mellitus, type 2) (HCC)    Coronary artery disease    Paroxysmal atrial fibrillation (HCC)    CHF (congestive heart failure) (HCC)    PCP:  [name], NP Pharmacy:   [id number] [place], [place] - [address] [place] [address] Phone: [phone] Fax: [phone]  [facility] [address] [place] - [address] [place] [address] Phone: [phone] Fax: [phone]     Social Determinants of Health (SDOH) Social History: SDOH Screenings   Food Insecurity: No Food Insecurity (05/22/2023)  Housing: Low Risk  (05/22/2023)  Transportation Needs: No Transportation Needs (05/22/2023)  Utilities: Not At Risk (05/22/2023)  Alcohol Screen: Low Risk  (06/10/2018)  Depression (PHQ2-9): Low Risk  (08/31/2022)  Financial Resource Strain: Low Risk  (12/01/2022)  Physical Activity: Unknown  (12/01/2022)  Social Connections: Socially Isolated (12/01/2022)  Stress: No Stress Concern Present (12/01/2022)  Tobacco Use: Medium Risk (05/22/2023)   SDOH Interventions:     Readmission Risk Interventions    05/23/2023    4:34 PM  Readmission Risk Prevention Plan  Transportation Screening Complete  PCP or Specialist Appt within 3-5 Days Complete  HRI or Home Care Consult Complete  Medication Review (RN Care Manager) Complete

## 2023-05-23 NOTE — Progress Notes (Signed)
   Patient Name: Jose Patel Date of Encounter: 05/23/2023 [facility] Cardiologist: Thurmon Fair, MD   Interval Summary  .    Feels better. Wants to go home. Ins and outs are not accurately recorded.   No weight recorded yet today.  According to yesterday's weight he is only 5 pounds above his usual "dry weight" of 145 pounds. Renal parameters are essentially unchanged over the last few days, but substantially worse when compared to earlier this year.  BNP markedly elevated over baseline. Marginal and adynamic increase in high-sensitivity troponin.  Vital Signs .    Vitals:   05/22/23 2120 05/23/23 0042 05/23/23 0525 05/23/23 0740  BP: 108/68 (!) 97/58 100/62 (!) 94/54  Pulse: 62 (!) 59 (!) 59 63  Resp: 18 18 18 18   Temp: 98.5 F (36.9 C) (!) 97.5 F (36.4 C) (!) 97.5 F (36.4 C) 97.8 F (36.6 C)  TempSrc:  Oral Oral Oral  SpO2: 99% 96% 98% 95%  Weight: 68.2 kg     Height: 5\' 10"  (1.778 m)       Intake/Output Summary (Last 24 hours) at 05/23/2023 0840 Last data filed at 05/23/2023 0835 Gross per 24 hour  Intake 220.19 ml  Output 400 ml  Net -179.81 ml      05/22/2023    9:20 PM 05/22/2023    2:59 PM 05/21/2023    1:30 PM  Last 3 Weights  Weight (lbs) 150 lb 5.7 oz 150 lb 3.2 oz 150 lb 3.2 oz  Weight (kg) 68.2 kg 68.13 kg 68.13 kg      Telemetry/ECG    A paced, V sensed - Personally Reviewed  Physical Exam .   GEN: No acute distress.   Neck: No JVD Cardiac: RRR, no murmurs, rubs, or gallops.  Respiratory: Clear to auscultation bilaterally. GI: Soft, nontender, non-distended  MS: trivial ankle edema (compression devices are on).  Assessment & Plan .     Clinically improved, except BP running lower than usual.  Entresto and spironolactone are on hold. Echo pending. Hold beta blocker for SBP<95, dose reduced.  Note desire for conservative management, even palliative care. One more day of IV diuretics.  For questions or updates, please  contact [facility] Please consult [email or web address] for contact info under        Signed, Thurmon Fair, MD

## 2023-05-23 NOTE — Progress Notes (Signed)
*  PRELIMINARY RESULTS* Echocardiogram 2D Echocardiogram has been performed.  Jose Patel 05/23/2023, 4:58 PM

## 2023-05-23 NOTE — Progress Notes (Signed)
Pt has Lasix 40mg  scheduled for 2000, BP 92/60, pt denied SOB, no respiratory distress noted. Julian Reil, MD notified.  RN instructed to give Lasix.

## 2023-05-23 NOTE — Plan of Care (Signed)
  Problem: Education: Goal: Ability to describe self-care measures that may prevent or decrease complications (Diabetes Survival Skills Education) will improve Outcome: Progressing   

## 2023-05-23 NOTE — Progress Notes (Signed)
PROGRESS NOTE    Jose Patel  ZOX:[id number] DOB: 1934-01-10 DOA: 05/22/2023 PCP: [name], NP   Brief Narrative: Jose Patel is a 89 y.o. male with a history of chronic systolic/diastolic heart failure, paroxysmal atrial fibrillation, sick sinus syndrome s/p PPM, diabetes mellitus type 2, primary hypertension, BPH.  Patient presented secondary to shortness of breath and was found to have evidence of acute heart failure. Lasix IV diuresis initiated. Cardiology consulted.   Assessment and Plan:  Acute on chronic combined systolic and diastolic heart failure Associated troponin elevation up to 231. Lasix IV diuresis initiated. Cardiology consulted. Transthoracic Echocardiogram ordered and pending. Weight of 68.1 kg on admission which appears stable today at 68.2 kg. UOP of 400 mL over the last 12 hours documented. -Follow-up Transthoracic Echocardiogram -Cardiology recommendations: Continue Lasix IV  AKI Baseline creatinine of 1.2-1.3 from earlier this year. Recent creatinine of 2.29 prior to admission and a creatinine of 1.99 on admission. Likely related to acute heart failure. Creatinine stable at 2.08 today. -BMP in AM  Hyperkalemia Present on admission with potassium of 5.7. Resolved with Lokelma.  Acute anemia Mild. No evidence of acute hemorrhage at this time. Patient is on Xarelto. -Trend CBC  Paroxysmal atrial fibrillation -Continue amiodarone, Toprol XL and Xarelto  Diabetes mellitus, type 2 -Continue SSI  Primary hypertension -Continue metoprolol  GERD -Continue Protonix  BPH Patient is on tamsulosin as an outpatient which is held on admission secondary to borderline hypotension.    DVT prophylaxis: Xarelto Code Status:   Code Status: Full Code Family Communication: None at bedside Disposition Plan: Discharge home in 1-2 days pending ongoing cardiology recommendations   Consultants:  Cardiology  Procedures:  None  Antimicrobials: None     Subjective: Patient reports no dyspnea or chest pain this morning. Eager to go home.  Objective: BP 100/62 (BP Location: Left Arm)   Pulse (!) 59   Temp (!) 97.5 F (36.4 C) (Oral)   Resp 18   Ht 5\' 10"  (1.778 m)   Wt 68.2 kg   SpO2 98%   BMI 21.57 kg/m   Examination:  General exam: Appears calm and comfortable Respiratory system: Clear to auscultation. Respiratory effort normal. Cardiovascular system: S1 & S2 heard, RRR. No murmurs. Gastrointestinal system: Abdomen is nondistended, soft and nontender. Normal bowel sounds heard. Central nervous system: Alert and oriented. No focal neurological deficits. Musculoskeletal: Ankle edema. No calf tenderness Skin: No cyanosis. No rashes Psychiatry: Judgement and insight appear normal. Mood & affect appropriate.    Data Reviewed: I have personally reviewed following labs and imaging studies  CBC Lab Results  Component Value Date   WBC 7.3 05/23/2023   RBC 3.41 (L) 05/23/2023   HGB 10.9 (L) 05/23/2023   HCT 33.4 (L) 05/23/2023   MCV 97.9 05/23/2023   MCH 32.0 05/23/2023   PLT 202 05/23/2023   MCHC 32.6 05/23/2023   RDW 15.6 (H) 05/23/2023   LYMPHSABS 1.3 05/23/2023   MONOABS 0.4 05/23/2023   EOSABS 0.3 05/23/2023   BASOSABS 0.0 05/23/2023     Last metabolic panel Lab Results  Component Value Date   NA 137 05/23/2023   K 4.1 05/23/2023   CL 105 05/23/2023   CO2 22 05/23/2023   BUN 36 (H) 05/23/2023   CREATININE 2.08 (H) 05/23/2023   GLUCOSE 111 (H) 05/23/2023   GFRNONAA 30 (L) 05/23/2023   GFRAA 58 (L) 06/22/2020   CALCIUM 8.1 (L) 05/23/2023   PHOS 4.0 05/23/2023   PROT 5.7 (L) 05/23/2023  ALBUMIN 2.6 (L) 05/23/2023   LABGLOB 2.8 12/05/2022   AGRATIO 1.4 12/05/2022   BILITOT 1.1 05/23/2023   ALKPHOS 176 (H) 05/23/2023   AST 18 05/23/2023   ALT 16 05/23/2023   ANIONGAP 10 05/23/2023    GFR: Estimated Creatinine Clearance: 24.1 mL/min (A) (by C-G formula based on SCr of 2.08 mg/dL (H)).  No  results found for this or any previous visit (from the past 240 hour(s)).    Radiology Studies: DG Chest Port 1 View  Result Date: 05/22/2023 CLINICAL DATA:  Shortness of breath EXAM: PORTABLE CHEST 1 VIEW COMPARISON:  Chest x-ray 05/21/2023 FINDINGS: Left-sided pacemaker again seen. The heart is enlarged, unchanged. There are small pleural effusions. There central pulmonary vascular congestion. There is atelectasis in the left lung base. There is no pneumothorax or acute fracture. IMPRESSION: Cardiomegaly with small pleural effusions and central pulmonary vascular congestion. Electronically Signed   By: Darliss Cheney M.D.   On: 05/22/2023 17:46   DG Chest 2 View  Result Date: 05/21/2023 CLINICAL DATA:  Shortness of breath. Diastolic congestive heart failure. EXAM: CHEST - 2 VIEW COMPARISON:  Radiograph 03/19/2023 FINDINGS: The heart is enlarged. Left-sided pacemaker in place. There are small bilateral pleural effusions. Vascular congestion with subtle septal thickening typical of edema. No confluent consolidation. No pneumothorax. IMPRESSION: Cardiomegaly with small pleural effusions and pulmonary edema consistent with CHF. Electronically Signed   By: Narda Rutherford M.D.   On: 05/21/2023 18:22      LOS: 1 day    Jacquelin Hawking, MD [facility] 05/23/2023, 7:35 AM   If 7PM-7AM, please contact night-coverage [email or web address]

## 2023-05-24 ENCOUNTER — Telehealth: Payer: Self-pay

## 2023-05-24 DIAGNOSIS — I5043 Acute on chronic combined systolic (congestive) and diastolic (congestive) heart failure: Secondary | ICD-10-CM | POA: Diagnosis not present

## 2023-05-24 LAB — BASIC METABOLIC PANEL
Anion gap: 11 (ref 5–15)
BUN: 37 mg/dL — ABNORMAL HIGH (ref 8–23)
CO2: 21 mmol/L — ABNORMAL LOW (ref 22–32)
Calcium: 8.1 mg/dL — ABNORMAL LOW (ref 8.9–10.3)
Chloride: 106 mmol/L (ref 98–111)
Creatinine, Ser: 2.31 mg/dL — ABNORMAL HIGH (ref 0.61–1.24)
GFR, Estimated: 27 mL/min — ABNORMAL LOW (ref >60)
Glucose, Bld: 138 mg/dL — ABNORMAL HIGH (ref 70–99)
Potassium: 4.3 mmol/L (ref 3.5–5.1)
Sodium: 138 mmol/L (ref 135–145)

## 2023-05-24 LAB — GLUCOSE, CAPILLARY
Glucose-Capillary: 132 mg/dL — ABNORMAL HIGH (ref 70–99)
Glucose-Capillary: 125 mg/dL — ABNORMAL HIGH (ref 70–99)
Glucose-Capillary: 143 mg/dL — ABNORMAL HIGH (ref 70–99)
Glucose-Capillary: 154 mg/dL — ABNORMAL HIGH (ref 70–99)

## 2023-05-24 MED ORDER — METOPROLOL SUCCINATE ER 25 MG PO TB24
12.5000 mg | ORAL_TABLET | Freq: Every day | ORAL | Status: DC
  Administered 2023-05-25 – 2023-05-26 (×2): 12.5 mg via ORAL
  Filled 2023-05-24 (×2): qty 1

## 2023-05-24 MED ORDER — EMPAGLIFLOZIN 10 MG PO TABS
10.0000 mg | ORAL_TABLET | Freq: Every day | ORAL | Status: DC
  Administered 2023-05-24 – 2023-05-26 (×3): 10 mg via ORAL
  Filled 2023-05-24 (×3): qty 1

## 2023-05-24 NOTE — Progress Notes (Signed)
Mobility Specialist Progress Note:    05/24/23 1143  Mobility  Activity  (bed level exercises)  Level of Assistance Standby assist, set-up cues, supervision of patient - no hands on  Assistive Device None  Activity Response Tolerated well  Mobility Referral Yes  $Mobility charge 1 Mobility  Mobility Specialist Start Time (ACUTE ONLY) 1058  Mobility Specialist Stop Time (ACUTE ONLY) 1108  Mobility Specialist Time Calculation (min) (ACUTE ONLY) 10 min   Pt received in bed very unmotivated but still very pleasant, wanting to stay in bed. After strong attempts to get pt active pt agreed to bed level exercises. Pt perform LE and UE exercises very well. Left w/ call bell and personal belongings in reach. All needs met.   Thompson Grayer Mobility Specialist  Please contact vis Secure Chat or  Rehab Office [phone]

## 2023-05-24 NOTE — Evaluation (Addendum)
Occupational Therapy Evaluation Patient Details Name: Jose Patel MRN: [id number] DOB: [birth year] Today's Date: 05/24/2023   History of Present Illness 89 y/o male presents to [facility] hospital on 05/22/2023 with SOB, admitted for management of acute on chronic CHF. PMH: HTN, CHF, DM type 2, Afib, hx of sinus node dysfunction s/p pacemaker placement, aortic insufficiency, sleep apnea, hx of stroke   Clinical Impression   PTA, pt lives in detached pool house of daughter's home, typically ambulatory with Rollator and has caregiver assist 2x/wk for showering and IADLs. Pt presents now with deficits in strength, endurance and standing balance. Pt requires Supervision for bed mobility, Supervision for brief standing with RW before requesting to return to bed due to fatigue. Pt requires Min A for ADLs. Emphasis on energy conservation strategies for ADLs w/ handout provided. No OT services recommended at DC based on plan to transition to hospice at home on DC. Will follow while admitted to maximize independence and reinforce energy conservation.      If plan is discharge home, recommend the following: A little help with walking and/or transfers;A little help with bathing/dressing/bathroom    Functional Status Assessment  Patient has had a recent decline in their functional status and demonstrates the ability to make significant improvements in function in a reasonable and predictable amount of time.  Equipment Recommendations  None recommended by OT    Recommendations for Other Services       Precautions / Restrictions Precautions Precautions: Fall Restrictions Weight Bearing Restrictions: No      Mobility Bed Mobility Overal bed mobility: Needs Assistance Bed Mobility: Supine to Sit, Sit to Supine     Supine to sit: Supervision, HOB elevated, Used rails Sit to supine: Supervision        Transfers Overall transfer level: Needs assistance Equipment used: Rolling walker (2  wheels) Transfers: Sit to/from Stand Sit to Stand: Supervision                  Balance Overall balance assessment: Needs assistance Sitting-balance support: No upper extremity supported, Feet supported Sitting balance-Leahy Scale: Good     Standing balance support: Bilateral upper extremity supported, Reliant on assistive device for balance Standing balance-Leahy Scale: Poor                             ADL either performed or assessed with clinical judgement   ADL Overall ADL's : Needs assistance/impaired Eating/Feeding: Independent   Grooming: Set up;Sitting   Upper Body Bathing: Minimal assistance;Sitting   Lower Body Bathing: Minimal assistance;Sit to/from stand   Upper Body Dressing : Minimal assistance   Lower Body Dressing: Minimal assistance Lower Body Dressing Details (indicate cue type and reason): assist to don shoes around heels EOB Toilet Transfer: Contact guard assist;Stand-pivot;Rolling walker (2 wheels)   Toileting- Clothing Manipulation and Hygiene: Minimal assistance;Sitting/lateral lean;Sit to/from stand         General ADL Comments: Limited by quick fatigue and weakness. provided energy conservation handout and education     Vision Ability to See in Adequate Light: 0 Adequate Patient Visual Report: No change from baseline Vision Assessment?: No apparent visual deficits     Perception         Praxis         Pertinent Vitals/Pain Pain Assessment Pain Assessment: No/denies pain     Extremity/Trunk Assessment Upper Extremity Assessment Upper Extremity Assessment: Generalized weakness;Right hand dominant   Lower Extremity Assessment Lower Extremity Assessment:  Defer to PT evaluation   Cervical / Trunk Assessment Cervical / Trunk Assessment: Kyphotic   Communication Communication Communication: No apparent difficulties Cueing Techniques: Verbal cues   Cognition Arousal: Alert Behavior During Therapy: WFL for tasks  assessed/performed Overall Cognitive Status: Within Functional Limits for tasks assessed                                       General Comments  No RW in pt room - had to locate    Exercises     Shoulder Instructions      Home Living Family/patient expects to be discharged to:: Private residence Living Arrangements: Children Available Help at Discharge: Family;Available PRN/intermittently Type of Home: House Home Access: Level entry     Home Layout: One level     Bathroom Shower/Tub: Other (comment) (walk in tub)   Bathroom Toilet: Standard     Home Equipment: Rollator (4 wheels);Transport chair;Lift chair;Shower seat - built in;Other (comment) (bedrail on bed)   Additional Comments: lives in detached pool house behind daughter's property      Prior Functioning/Environment Prior Level of Function : Needs assist             Mobility Comments: ambulates for very short household distances with rollator, often scooting around on rollator recently. ADLs Comments: reports caregiver 2x week for showering assist. able to dress self, difficult to obtain info on who assists pt if caregiver not present. caregiver will assist with meals, transportation.        OT Problem List: Decreased strength;Decreased activity tolerance;Impaired balance (sitting and/or standing)      OT Treatment/Interventions: Self-care/ADL training;Therapeutic exercise;Energy conservation;DME and/or AE instruction;Therapeutic activities;Patient/family education;Balance training    OT Goals(Current goals can be found in the care plan section) Acute Rehab OT Goals Patient Stated Goal: go home soon OT Goal Formulation: With patient Time For Goal Achievement: 06/14/23 Potential to Achieve Goals: Fair ADL Goals Pt Will Perform Lower Body Dressing: with supervision;sitting/lateral leans;sit to/from stand Pt Will Transfer to Toilet: with contact guard assist;ambulating Additional ADL  Goal #1: Pt to verbalize at least 3 energy conservation strategies to implement at home  OT Frequency: Min 1X/week    Co-evaluation              AM-PAC OT "6 Clicks" Daily Activity     Outcome Measure Help from another person eating meals?: None Help from another person taking care of personal grooming?: A Little Help from another person toileting, which includes using toliet, bedpan, or urinal?: A Little Help from another person bathing (including washing, rinsing, drying)?: A Little Help from another person to put on and taking off regular upper body clothing?: A Little Help from another person to put on and taking off regular lower body clothing?: A Little 6 Click Score: 19   End of Session Equipment Utilized During Treatment: Rolling walker (2 wheels);Gait belt Nurse Communication: Mobility status;Other (comment) (need for RW in room)  Activity Tolerance: Patient limited by fatigue Patient left: in bed;with bed alarm set;with call bell/phone within reach  OT Visit Diagnosis: Muscle weakness (generalized) (M62.81);Unsteadiness on feet (R26.81)                Time: 9604-5409 OT Time Calculation (min): 20 min Charges:  OT General Charges $OT Visit: 1 Visit OT Evaluation $OT Eval Low Complexity: 1 Low  Bradd Canary, OTR/L Acute Rehab Services Office: [phone]  Lorre Munroe 05/24/2023, 9:18 AM

## 2023-05-24 NOTE — Progress Notes (Addendum)
PROGRESS NOTE    Jose Patel  UXL:[id number] DOB: [birth year] DOA: 05/22/2023 PCP: [name], NP   Brief Narrative: Jose Patel is a 89 y.o. male with a history of chronic systolic/diastolic heart failure, paroxysmal atrial fibrillation, sick sinus syndrome s/p PPM, diabetes mellitus type 2, primary hypertension, BPH.  Patient presented secondary to shortness of breath and was found to have evidence of acute heart failure. Lasix IV diuresis initiated. Cardiology consulted.   Assessment and Plan:  Acute on chronic combined systolic and diastolic heart failure Associated troponin elevation up to 231. Lasix IV diuresis initiated. Cardiology consulted. Transthoracic Echocardiogram ordered and pending. Weight of 68.1 kg on admission which appears increased at 69.8 kg. UOP of 750 mL over the last 24 hours documented. -Cardiology recommendations: Continue Lasix IV, palliative care with consideration of home hospice  AKI on CKD stage IIIa Baseline creatinine of 1.2-1.3 from earlier this year. Recent creatinine of 2.29 prior to admission and a creatinine of 1.99 on admission. Likely related to acute heart failure. Creatinine pending -Daily BMP while on Lasix IV  Hyperkalemia Present on admission with potassium of 5.7. Resolved with Lokelma.  Acute anemia Mild. No evidence of acute hemorrhage at this time. Patient is on Xarelto. -Trend CBC  Paroxysmal atrial fibrillation -Continue amiodarone, Toprol XL and Xarelto  Diabetes mellitus, type 2 -Continue SSI  Primary hypertension -Continue metoprolol  GERD -Continue Protonix  BPH Patient is on tamsulosin as an outpatient which is held on admission secondary to borderline hypotension.    DVT prophylaxis: Xarelto Code Status:   Code Status: Full Code Family Communication: None at bedside Disposition Plan: Discharge home in 1-2 days pending ongoing cardiology recommendations in addition to palliative care  recommendations   Consultants:  Cardiology Palliative care medicine  Procedures:  None  Antimicrobials: None    Subjective: No concerns. Continues to desire discharge home.  Objective: BP (!) 95/59 (BP Location: Right Arm)   Pulse 61   Temp 97.7 F (36.5 C) (Oral)   Resp 18   Ht 5\' 10"  (1.778 m)   Wt 69.8 kg   SpO2 92%   BMI 22.08 kg/m   Examination:  General exam: Appears calm and comfortable Respiratory system: Clear to auscultation. Respiratory effort normal. Cardiovascular system: S1 & S2 heard, RRR. Gastrointestinal system: Abdomen is nondistended, soft and nontender. No organomegaly or masses felt. Normal bowel sounds heard. Central nervous system: Alert and oriented. Musculoskeletal:  No calf tenderness   Data Reviewed: I have personally reviewed following labs and imaging studies  CBC Lab Results  Component Value Date   WBC 7.3 05/23/2023   RBC 3.41 (L) 05/23/2023   HGB 10.9 (L) 05/23/2023   HCT 33.4 (L) 05/23/2023   MCV 97.9 05/23/2023   MCH 32.0 05/23/2023   PLT 202 05/23/2023   MCHC 32.6 05/23/2023   RDW 15.6 (H) 05/23/2023   LYMPHSABS 1.3 05/23/2023   MONOABS 0.4 05/23/2023   EOSABS 0.3 05/23/2023   BASOSABS 0.0 05/23/2023     Last metabolic panel Lab Results  Component Value Date   NA 137 05/23/2023   K 4.1 05/23/2023   CL 105 05/23/2023   CO2 22 05/23/2023   BUN 36 (H) 05/23/2023   CREATININE 2.08 (H) 05/23/2023   GLUCOSE 111 (H) 05/23/2023   GFRNONAA 30 (L) 05/23/2023   GFRAA 58 (L) 06/22/2020   CALCIUM 8.1 (L) 05/23/2023   PHOS 4.0 05/23/2023   PROT 5.7 (L) 05/23/2023   ALBUMIN 2.6 (L) 05/23/2023   LABGLOB  PROGRESS NOTE    Jose Patel  UXL:[id number] DOB: [birth year] DOA: 05/22/2023 PCP: [name], NP   Brief Narrative: Jose Patel is a 89 y.o. male with a history of chronic systolic/diastolic heart failure, paroxysmal atrial fibrillation, sick sinus syndrome s/p PPM, diabetes mellitus type 2, primary hypertension, BPH.  Patient presented secondary to shortness of breath and was found to have evidence of acute heart failure. Lasix IV diuresis initiated. Cardiology consulted.   Assessment and Plan:  Acute on chronic combined systolic and diastolic heart failure Associated troponin elevation up to 231. Lasix IV diuresis initiated. Cardiology consulted. Transthoracic Echocardiogram ordered and pending. Weight of 68.1 kg on admission which appears increased at 69.8 kg. UOP of 750 mL over the last 24 hours documented. -Cardiology recommendations: Continue Lasix IV, palliative care with consideration of home hospice  AKI on CKD stage IIIa Baseline creatinine of 1.2-1.3 from earlier this year. Recent creatinine of 2.29 prior to admission and a creatinine of 1.99 on admission. Likely related to acute heart failure. Creatinine pending -Daily BMP while on Lasix IV  Hyperkalemia Present on admission with potassium of 5.7. Resolved with Lokelma.  Acute anemia Mild. No evidence of acute hemorrhage at this time. Patient is on Xarelto. -Trend CBC  Paroxysmal atrial fibrillation -Continue amiodarone, Toprol XL and Xarelto  Diabetes mellitus, type 2 -Continue SSI  Primary hypertension -Continue metoprolol  GERD -Continue Protonix  BPH Patient is on tamsulosin as an outpatient which is held on admission secondary to borderline hypotension.    DVT prophylaxis: Xarelto Code Status:   Code Status: Full Code Family Communication: None at bedside Disposition Plan: Discharge home in 1-2 days pending ongoing cardiology recommendations in addition to palliative care  recommendations   Consultants:  Cardiology Palliative care medicine  Procedures:  None  Antimicrobials: None    Subjective: No concerns. Continues to desire discharge home.  Objective: BP (!) 95/59 (BP Location: Right Arm)   Pulse 61   Temp 97.7 F (36.5 C) (Oral)   Resp 18   Ht 5\' 10"  (1.778 m)   Wt 69.8 kg   SpO2 92%   BMI 22.08 kg/m   Examination:  General exam: Appears calm and comfortable Respiratory system: Clear to auscultation. Respiratory effort normal. Cardiovascular system: S1 & S2 heard, RRR. Gastrointestinal system: Abdomen is nondistended, soft and nontender. No organomegaly or masses felt. Normal bowel sounds heard. Central nervous system: Alert and oriented. Musculoskeletal:  No calf tenderness   Data Reviewed: I have personally reviewed following labs and imaging studies  CBC Lab Results  Component Value Date   WBC 7.3 05/23/2023   RBC 3.41 (L) 05/23/2023   HGB 10.9 (L) 05/23/2023   HCT 33.4 (L) 05/23/2023   MCV 97.9 05/23/2023   MCH 32.0 05/23/2023   PLT 202 05/23/2023   MCHC 32.6 05/23/2023   RDW 15.6 (H) 05/23/2023   LYMPHSABS 1.3 05/23/2023   MONOABS 0.4 05/23/2023   EOSABS 0.3 05/23/2023   BASOSABS 0.0 05/23/2023     Last metabolic panel Lab Results  Component Value Date   NA 137 05/23/2023   K 4.1 05/23/2023   CL 105 05/23/2023   CO2 22 05/23/2023   BUN 36 (H) 05/23/2023   CREATININE 2.08 (H) 05/23/2023   GLUCOSE 111 (H) 05/23/2023   GFRNONAA 30 (L) 05/23/2023   GFRAA 58 (L) 06/22/2020   CALCIUM 8.1 (L) 05/23/2023   PHOS 4.0 05/23/2023   PROT 5.7 (L) 05/23/2023   ALBUMIN 2.6 (L) 05/23/2023   LABGLOB  2.8 12/05/2022   AGRATIO 1.4 12/05/2022   BILITOT 1.1 05/23/2023   ALKPHOS 176 (H) 05/23/2023   AST 18 05/23/2023   ALT 16 05/23/2023   ANIONGAP 10 05/23/2023    GFR: Estimated Creatinine Clearance: 24.7 mL/min (A) (by C-G formula based on SCr of 2.08 mg/dL (H)).  No results found for this or any previous visit  (from the past 240 hour(s)).    Radiology Studies: ECHOCARDIOGRAM COMPLETE  Result Date: 05/23/2023    ECHOCARDIOGRAM REPORT   Patient Name:   Jose Patel Date of Exam: 05/23/2023 Medical Rec #:  [id number]     Height:       70.0 in Accession #:    [id number]    Weight:       150.4 lb Date of Birth:  [birth year]     BSA:          1.849 m Patient Age:    89 years      BP:           93/59 mmHg Patient Gender: M             HR:           61 bpm. Exam Location:  Inpatient Procedure: 2D Echo, 3D Echo, Cardiac Doppler, Color Doppler, Strain Analysis and            Intracardiac Opacification Agent Indications:     CHF I50.9  History:         Patient has prior history of Echocardiogram examinations, most                  recent 09/15/2022. CHF, CAD, Pacemaker, Arrythmias:Atrial                  Fibrillation, Signs/Symptoms:Shortness of Breath; Risk                  Factors:Hypertension, Dyslipidemia and Diabetes.  Sonographer:     Dondra Prader RVT RCS Referring Phys:  [id number] Tessa Lerner Diagnosing Phys: Arvilla Meres MD IMPRESSIONS  1. Left ventricular ejection fraction, by estimation, is 20 to 25%. The left ventricle has severely decreased function. The left ventricle demonstrates regional wall motion abnormalities (see scoring diagram/findings for description). The left ventricular internal cavity size was mildly dilated. Left ventricular diastolic parameters are consistent with Grade II diastolic dysfunction (pseudonormalization).  2. Right ventricular systolic function is moderately reduced. The right ventricular size is normal.  3. Left atrial size was severely dilated.  4. Right atrial size was moderately dilated.  5. The mitral valve is normal in structure. Moderate to severe mitral valve regurgitation. No evidence of mitral stenosis.  6. Tricuspid valve regurgitation is mild to moderate.  7. The aortic valve is normal in structure. There is mild calcification of the aortic valve. Aortic valve regurgitation  is severe. Aortic valve sclerosis/calcification is present, without any evidence of aortic stenosis.  8. Aortic dilatation noted. There is mild dilatation of the ascending aorta, measuring 43 mm.  9. The inferior vena cava is dilated in size with <50% respiratory variability, suggesting right atrial pressure of 15 mmHg. Comparison(s): EF 25-30%. FINDINGS  Left Ventricle: Left ventricular ejection fraction, by estimation, is 20 to 25%. The left ventricle has severely decreased function. The left ventricle demonstrates regional wall motion abnormalities. Definity contrast agent was given IV to delineate the left ventricular endocardial borders. The left ventricular internal cavity size was mildly dilated. There is no left ventricular hypertrophy. Left ventricular diastolic parameters are consistent  PROGRESS NOTE    Jose Patel  UXL:[id number] DOB: [birth year] DOA: 05/22/2023 PCP: [name], NP   Brief Narrative: Jose Patel is a 89 y.o. male with a history of chronic systolic/diastolic heart failure, paroxysmal atrial fibrillation, sick sinus syndrome s/p PPM, diabetes mellitus type 2, primary hypertension, BPH.  Patient presented secondary to shortness of breath and was found to have evidence of acute heart failure. Lasix IV diuresis initiated. Cardiology consulted.   Assessment and Plan:  Acute on chronic combined systolic and diastolic heart failure Associated troponin elevation up to 231. Lasix IV diuresis initiated. Cardiology consulted. Transthoracic Echocardiogram ordered and pending. Weight of 68.1 kg on admission which appears increased at 69.8 kg. UOP of 750 mL over the last 24 hours documented. -Cardiology recommendations: Continue Lasix IV, palliative care with consideration of home hospice  AKI on CKD stage IIIa Baseline creatinine of 1.2-1.3 from earlier this year. Recent creatinine of 2.29 prior to admission and a creatinine of 1.99 on admission. Likely related to acute heart failure. Creatinine pending -Daily BMP while on Lasix IV  Hyperkalemia Present on admission with potassium of 5.7. Resolved with Lokelma.  Acute anemia Mild. No evidence of acute hemorrhage at this time. Patient is on Xarelto. -Trend CBC  Paroxysmal atrial fibrillation -Continue amiodarone, Toprol XL and Xarelto  Diabetes mellitus, type 2 -Continue SSI  Primary hypertension -Continue metoprolol  GERD -Continue Protonix  BPH Patient is on tamsulosin as an outpatient which is held on admission secondary to borderline hypotension.    DVT prophylaxis: Xarelto Code Status:   Code Status: Full Code Family Communication: None at bedside Disposition Plan: Discharge home in 1-2 days pending ongoing cardiology recommendations in addition to palliative care  recommendations   Consultants:  Cardiology Palliative care medicine  Procedures:  None  Antimicrobials: None    Subjective: No concerns. Continues to desire discharge home.  Objective: BP (!) 95/59 (BP Location: Right Arm)   Pulse 61   Temp 97.7 F (36.5 C) (Oral)   Resp 18   Ht 5\' 10"  (1.778 m)   Wt 69.8 kg   SpO2 92%   BMI 22.08 kg/m   Examination:  General exam: Appears calm and comfortable Respiratory system: Clear to auscultation. Respiratory effort normal. Cardiovascular system: S1 & S2 heard, RRR. Gastrointestinal system: Abdomen is nondistended, soft and nontender. No organomegaly or masses felt. Normal bowel sounds heard. Central nervous system: Alert and oriented. Musculoskeletal:  No calf tenderness   Data Reviewed: I have personally reviewed following labs and imaging studies  CBC Lab Results  Component Value Date   WBC 7.3 05/23/2023   RBC 3.41 (L) 05/23/2023   HGB 10.9 (L) 05/23/2023   HCT 33.4 (L) 05/23/2023   MCV 97.9 05/23/2023   MCH 32.0 05/23/2023   PLT 202 05/23/2023   MCHC 32.6 05/23/2023   RDW 15.6 (H) 05/23/2023   LYMPHSABS 1.3 05/23/2023   MONOABS 0.4 05/23/2023   EOSABS 0.3 05/23/2023   BASOSABS 0.0 05/23/2023     Last metabolic panel Lab Results  Component Value Date   NA 137 05/23/2023   K 4.1 05/23/2023   CL 105 05/23/2023   CO2 22 05/23/2023   BUN 36 (H) 05/23/2023   CREATININE 2.08 (H) 05/23/2023   GLUCOSE 111 (H) 05/23/2023   GFRNONAA 30 (L) 05/23/2023   GFRAA 58 (L) 06/22/2020   CALCIUM 8.1 (L) 05/23/2023   PHOS 4.0 05/23/2023   PROT 5.7 (L) 05/23/2023   ALBUMIN 2.6 (L) 05/23/2023   LABGLOB

## 2023-05-24 NOTE — Progress Notes (Signed)
   Patient Name: Jose Patel Date of Encounter: 05/24/2023 [facility] Cardiologist: Thurmon Fair, MD   Interval Summary  .    Repeats that he wants to go home.  When I asked him about dyspnea he says "I breathe better at home". Echocardiogram is consistent with an interval extensive anterior wall myocardial infarction.  He does not have any angina currently, but he had left shoulder pain when he was in [facility]. Blood pressure remains low.  Labs pending.  Vital Signs .    Vitals:   05/23/23 2345 05/24/23 0410 05/24/23 0412 05/24/23 0733  BP: (!) 92/56 99/63  (!) 95/59  Pulse: 61 (!) 58  61  Resp: 18 18  18   Temp: 97.8 F (36.6 C) (!) 97.4 F (36.3 C)  97.7 F (36.5 C)  TempSrc: Oral Oral  Oral  SpO2: 95% 98%  92%  Weight:   69.8 kg   Height:        Intake/Output Summary (Last 24 hours) at 05/24/2023 0858 Last data filed at 05/24/2023 0423 Gross per 24 hour  Intake 120 ml  Output 750 ml  Net -630 ml      05/24/2023    4:12 AM 05/23/2023   10:00 AM 05/22/2023    9:20 PM  Last 3 Weights  Weight (lbs) 153 lb 14.1 oz 150 lb 6.4 oz 150 lb 5.7 oz  Weight (kg) 69.8 kg 68.221 kg 68.2 kg      Telemetry/ECG    Atrial paced, ventricular sensed- Personally Reviewed  Physical Exam .   GEN: No acute distress.   Neck: No JVD Cardiac: RRR, no murmurs, rubs, or gallops.  Respiratory: Diminished breath sounds in both bases and a few rales in the right lung base. GI: Soft, nontender, non-distended  MS: No edema  Assessment & Plan .     Continues to be borderline hypotensive, although without features suggesting overt shock. No respiratory distress, but the echocardiogram shows findings consistent with elevated mean left atrial pressure and there is a visible left pleural effusion on the ultrasound images.  He is still hypervolemic. There is no room to re-institute more aggressive medical therapy for heart failure. He does not want to undergo any invasive  evaluation or therapy. Continue diuretics and amiodarone.  Will reduce the dose of metoprolol.  Restart Jardiance. A palliative care approach is appropriate, including home hospice.  For questions or updates, please contact [facility] Please consult [email or web address] for contact info under        Signed, Thurmon Fair, MD

## 2023-05-24 NOTE — Telephone Encounter (Addendum)
Patient admitted to Hospital. Cardiology following up in hospital.----- Message from Joni Reining sent at 05/23/2023 11:53 AM EDT ----- Patient is admitted.  Cardiology will follow.

## 2023-05-24 NOTE — Progress Notes (Signed)
Heart Failure Navigator Progress Note  Assessed for Heart & Vascular TOC clinic readiness.  Patient does not meet criteria due to Palliative care consult for Comfort care. . Has a [facility] appointment on 06/06/2023.   Navigator will sign off at this time.    Rhae Hammock, BSN, Scientist, clinical (histocompatibility and immunogenetics) Only

## 2023-05-25 ENCOUNTER — Encounter: Payer: Self-pay | Admitting: Cardiovascular Disease

## 2023-05-25 DIAGNOSIS — Z515 Encounter for palliative care: Secondary | ICD-10-CM | POA: Diagnosis not present

## 2023-05-25 DIAGNOSIS — Z66 Do not resuscitate: Secondary | ICD-10-CM | POA: Diagnosis not present

## 2023-05-25 DIAGNOSIS — N179 Acute kidney failure, unspecified: Secondary | ICD-10-CM | POA: Diagnosis not present

## 2023-05-25 DIAGNOSIS — R531 Weakness: Secondary | ICD-10-CM

## 2023-05-25 DIAGNOSIS — I5043 Acute on chronic combined systolic (congestive) and diastolic (congestive) heart failure: Secondary | ICD-10-CM | POA: Diagnosis not present

## 2023-05-25 LAB — GLUCOSE, CAPILLARY
Glucose-Capillary: 114 mg/dL — ABNORMAL HIGH (ref 70–99)
Glucose-Capillary: 114 mg/dL — ABNORMAL HIGH (ref 70–99)
Glucose-Capillary: 133 mg/dL — ABNORMAL HIGH (ref 70–99)

## 2023-05-25 LAB — BASIC METABOLIC PANEL
Anion gap: 14 (ref 5–15)
BUN: 39 mg/dL — ABNORMAL HIGH (ref 8–23)
CO2: 20 mmol/L — ABNORMAL LOW (ref 22–32)
Calcium: 7.6 mg/dL — ABNORMAL LOW (ref 8.9–10.3)
Chloride: 104 mmol/L (ref 98–111)
Creatinine, Ser: 2.36 mg/dL — ABNORMAL HIGH (ref 0.61–1.24)
GFR, Estimated: 26 mL/min — ABNORMAL LOW (ref >60)
Glucose, Bld: 130 mg/dL — ABNORMAL HIGH (ref 70–99)
Potassium: 4.3 mmol/L (ref 3.5–5.1)
Sodium: 138 mmol/L (ref 135–145)

## 2023-05-25 MED ORDER — FUROSEMIDE 40 MG PO TABS
40.0000 mg | ORAL_TABLET | Freq: Every day | ORAL | Status: DC
  Administered 2023-05-25 – 2023-05-26 (×2): 40 mg via ORAL
  Filled 2023-05-25 (×2): qty 1

## 2023-05-25 NOTE — Consult Note (Signed)
Consultation Note Date: 05/25/2023   Patient Name: Jose Patel  DOB: 10-12-1933  MRN: [id number]  Age / Sex: 89 y.o., male  PCP: Jose Seller, NP Referring Physician: Narda Bonds, MD  Reason for Consultation: Establishing goals of care  HPI/Patient Profile: 89 y.o. 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  Low blood pressure limits use of heart failure medications.  Evidence of low cardiac output with renal insufficiency.  No evidence of ongoing ischemia.   Patient and family face treatment option decisions, advanced directive decisions and anticipatory care needs.  Clinical Assessment and Goals of Care:  This NP Jose Patel reviewed medical records, received report from team, assessed the patient and then meet at the patient's bedside  along with his daughter/HPOA/ Jose Patel to discuss diagnosis, prognosis, GOC, EOL wishes disposition and options.     Concept of Palliative Care was introduced as specialized medical care for people and their families living with serious illness.  If focuses on providing relief from the symptoms and stress of a serious illness.  The goal is to improve quality of life for both the patient and the family.Values and goals of care important to patient and family were attempted to be elicited.  Created space and  opportunity for patient  and family to explore thoughts and feelings regarding current medical situation.  Patient very comfortably communicates his understanding of his serious life limiting illness and associated poor prognosis, ultimately he wants to "go home".    Education offered on the difference between a full medical support path and a palliative comfort path for this patient, at this time and in this situation.  [name] communicates a sense of comfort in transitioning to a full comfort path, excepting hospice services at home and allowing a natural death.  Many "thumbs up" during our conversation today.  Patient lives at home with support from his family..  According to his daughter they have support from a program named [facility].  Previous discussions regarding transition to hospice with his home health agency.    Daughter tells me that there is an associated hospice with this program and that is who she would want to work with for her father.  She feels very confident in their ability to care for her father at home.  Patient and daughter are anticipating a discharge home tomorrow and will self trigger a hospice referral in [facility]  A  discussion was had today regarding advanced directives.  Concepts specific to code status, artifical feeding and hydration, continued IV antibiotics and rehospitalization was  had.  The difference between a aggressive medical intervention path  and a palliative comfort care path for this patient at this time was had.    MOST form completed to reflect comfort measures.  DNR/no artificial feeding or hydration now or in the future/avoid re-hospitalizations.   Natural trajectory and expectations at EOL were discussed.    Questions and concerns addressed.  Patient/family encouraged to call with questions or concerns.     PMT will continue to support holistically.          Patient's daughter/H POA see documents in ACP  tab       SUMMARY OF RECOMMENDATIONS    Code Status/Advance Care Planning: DNR   Palliative Prophylaxis:  Aspiration, Delirium Protocol, and Frequent Pain Assessment  Additional Recommendations (Limitations, Scope, Preferences): Avoid Hospitalization and No Artificial Feeding  Psycho-social/Spiritual:  Desire for further Chaplaincy support:no Additional Recommendations: Education on Hospice  Prognosis:  < 3 months  Discharge Planning: Home with Hospice      Primary Diagnoses: Present on Admission:  Cardiac pacemaker in situ  AKI (acute kidney injury) (HCC)  Elevated troponin level not due myocardial infarction  Hyperkalemia  Acute on chronic combined systolic and diastolic CHF (congestive heart failure) (HCC)  Acute anemia  Paroxysmal atrial fibrillation (HCC)  Essential hypertension  GERD (gastroesophageal reflux disease)  BPH (benign prostatic hyperplasia)  Elevated troponin   I have reviewed the medical record, interviewed the patient and family, and examined the patient. The following aspects are pertinent.  Past Medical History:  Diagnosis Date   A-fib (HCC)    Anticoagulated on Coumadin    Per records from [facility]    Aortic regurgitation    Aortic stenosis    CHF (congestive heart failure) (HCC)    Coronary artery disease    COVID    CVA (cerebral vascular accident) (HCC)    hx of CVA noted on CT from 02/19/18   Diabetes mellitus, type 2 (HCC)    Gastro-esophageal reflux disease without esophagitis    Per records [facility] provider, [facility]   Heart attack [facility])    History of CT scan of head 02/19/2018   Per records from previous provider, [facility]. Chronic changes small vessel disease   History of ECG    03/26/14- Sinus Tachycardia @ 107 bmp, QRS 78 msec, QT 298 msec, QTc 361 msec. 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 [facility])    Per records from previous provider, [facility]    MI (myocardial infarction) [facility])    Per records from [facility]    Mitral regurgitation    Mitral stenosis    Other intervertebral disc degeneration, lumbar region    Per records from previous provider, [facility]   Radiculopathy, lumbar region    Per records from previous provider, [facility]   Sinus bradycardia    Per records from [facility]    Sleep apnea    Transient ischemic attack    10 to 12 years ago   Typical atrial flutter [facility])    Per records from [facility]    Social History   Socioeconomic History   Marital status: Widowed    Spouse name: Not  on file   Number of children: Not on file   Years of education: Not on file   Highest education level: Associate degree: occupational, Scientist, product/process development, or vocational program  Occupational History   Not on file  Tobacco Use   Smoking status: Former    Current packs/day: 0.00    Types: Cigarettes, Cigars    Start date: 07/31/1952    Quit date: 07/31/1953    Years since quitting: 69.8   Smokeless tobacco: Never   Tobacco comments:    Quit at age 3  Vaping Use   Vaping status: Never Used  Substance and Sexual Activity   Alcohol use: Never   Drug use: Never   Sexual activity: Not Currently  Other Topics Concern   Not on file  Social History Narrative   Social History      Diet?       Do you drink/eat things with caffeine? yes      Marital status?          married                          What year were you married? 1957      Do you live in a house, apartment, assisted living, condo, trailer, etc.? home      Is it one or more stories? 1      How many persons live in your home? 4      Do you have any pets in your home? (please  list) yes- 3 dogs, 1 cat      Highest level of education completed? 12 yrs + trade school      Current or past profession: Paediatric nurse, Market researcher TV lineman      Do you exercise?           no                           Type & how often?      Advanced Directives      Do you have a living will? yes      Do you have a DNR form?                                  If not, do you want to discuss one? no      Do you have signed POA/HPOA for forms? yes      Functional Status      Do you have difficulty bathing or dressing yourself? no      Do you have difficulty preparing food or eating? no      Do you have difficulty managing your medications? no      Do you have difficulty managing your finances? no      Do you have difficulty affording your medications? Yes xarelto   Social Determinants of Health   Financial Resource Strain: Low Risk  (12/01/2022)   Overall Financial Resource Strain (CARDIA)    Difficulty of Paying Living Expenses: Not hard at all  Food Insecurity: No Food Insecurity (05/22/2023)   Hunger Vital Sign    Worried About Running Out of Food in the Last Year: Never true    Ran Out of Food in the Last Year: Never true  Transportation Needs: No Transportation Needs (05/22/2023)   PRAPARE - Administrator, Civil Service (  Medical): No    Lack of Transportation (Non-Medical): No  Physical Activity: Unknown (12/01/2022)   Exercise Vital Sign    Days of Exercise per Week: 0 days    Minutes of Exercise per Session: Not on file  Stress: No Stress Concern Present (12/01/2022)   [employer] of Occupational Health - Occupational Stress Questionnaire    Feeling of Stress : Only a little  Social Connections: Socially Isolated (12/01/2022)   Social Connection and Isolation Panel [NHANES]    Frequency of Communication with Friends and Family: More than three times a week    Frequency of Social Gatherings with Friends and Family: Three times a week    Attends Religious Services:  Never    Active Member of Clubs or Organizations: No    Attends Banker Meetings: Not on file    Marital Status: Widowed   Family History  Problem Relation Age of Onset   Heart failure Mother    Hyperlipidemia Mother    Hypertension Son    Hyperlipidemia Son    Arthritis Son    Arthritis Daughter    Hypertension Son    Scheduled Meds:  amiodarone  100 mg Oral Daily   empagliflozin  10 mg Oral Daily   furosemide  40 mg Oral Daily   insulin aspart  0-6 Units Subcutaneous TID WC   memantine  10 mg Oral BID   metoprolol succinate  12.5 mg Oral Daily   pantoprazole  40 mg Oral Daily   rivaroxaban  15 mg Oral Q supper   Continuous Infusions: PRN Meds:.acetaminophen **OR** acetaminophen, melatonin, ondansetron (ZOFRAN) IV Medications Prior to Admission:  Prior to Admission medications   Medication Sig Start Date End Date Taking? Authorizing Provider  amiodarone (PACERONE) 100 MG tablet Take 1 tablet (100 mg total) by mouth daily. 02/21/23  Yes Croitoru, Mihai, MD  Cholecalciferol (VITAMIN D) 125 MCG (5000 UT) CAPS Take 5,000 Units by mouth daily at 6 (six) AM. 11/12/22  Yes [provider]  diphenhydramine-acetaminophen (TYLENOL PM) 25-500 MG TABS tablet Take 1 tablet by mouth at bedtime as needed.   Yes [provider]  empagliflozin (JARDIANCE) 10 MG TABS tablet Take 1 tablet (10 mg total) by mouth daily before breakfast. 09/25/22  Yes Croitoru, Mihai, MD  isosorbide mononitrate (IMDUR) 30 MG 24 hr tablet Take 0.5 tablets (15 mg total) by mouth daily. Patient taking differently: Take 30 mg by mouth daily. 04/10/23 07/09/23 Yes Azalee Course, PA  Melatonin 5 MG CHEW Chew 5 mg by mouth daily as needed (sleep).   Yes [provider]  memantine (NAMENDA) 10 MG tablet TAKE 1 TABLET BY MOUTH TWICE A DAY 05/22/23  Yes Jose Seller, NP  metoprolol succinate (TOPROL-XL) 50 MG 24 hr tablet TAKE 1 TABLET BY MOUTH EVERY DAY 05/22/23  Yes Croitoru, Mihai, MD   omeprazole (PRILOSEC) 20 MG capsule Take 1 capsule (20 mg total) by mouth daily. 04/12/23  Yes Jose Seller, NP  potassium chloride (KLOR-CON) 10 MEQ tablet TAKE 1 TABLET BY MOUTH EVERY DAY 05/22/23  Yes Jose Seller, NP  rivaroxaban (XARELTO) 20 MG TABS tablet TAKE ONE TABLET BY MOUTH ONE TIME DAILY WITH SUPPER 03/07/23  Yes Eubanks, Janene Harvey, NP  sacubitril-valsartan (ENTRESTO) 49-51 MG Take 1 tablet by mouth 2 (two) times daily. 09/26/22  Yes Croitoru, Mihai, MD  spironolactone (ALDACTONE) 25 MG tablet Take 0.5 tablets (12.5 mg total) by mouth daily. 02/21/23  Yes Croitoru, Mihai, MD  tamsulosin (FLOMAX) 0.4  MG CAPS capsule Take 1 capsule (0.4 mg total) by mouth daily. 02/21/23  Yes Jose Seller, NP  torsemide (DEMADEX) 10 MG tablet Take 10 mg every Monday and Thursday in the morning Patient taking differently: Take 20 mg by mouth daily. 02/21/23  Yes Croitoru, Mihai, MD  TRAZODONE HCL PO Take 0.5 tablets by mouth daily as needed.   Yes [provider]   Allergies  Allergen Reactions   Aricept [Donepezil] Diarrhea, Nausea And Vomiting and Other (See Comments)    Cannot tolerate- bradycardia, also   Tropicamide Nausea And Vomiting    Used to dilate eyes (might be this- was switched to something this was tolerable, after this)   Adhesive [Tape] Rash   Review of Systems  Constitutional:  Positive for fatigue.  Respiratory:  Positive for shortness of breath.   Neurological:  Positive for weakness.    Physical Exam Cardiovascular:     Rate and Rhythm: Normal rate.  Pulmonary:     Effort: Tachypnea present.  Skin:    General: Skin is warm and dry.  Neurological:     Mental Status: He is alert and oriented to person, place, and time.     Vital Signs: BP 100/67 (BP Location: Left Arm)   Pulse 60   Temp 97.8 F (36.6 C) (Oral)   Resp 17   Ht 5\' 10"  (1.778 m)   Wt 69 kg   SpO2 95%   BMI 21.83 kg/m  Pain Scale: 0-10   Pain Score: 0-No pain   SpO2:  SpO2: 95 % O2 Device:SpO2: 95 % O2 Flow Rate: .   IO: Intake/output summary:  Intake/Output Summary (Last 24 hours) at 05/25/2023 1027 Last data filed at 05/25/2023 0600 Gross per 24 hour  Intake 0 ml  Output 600 ml  Net -600 ml    LBM: Last BM Date : 05/22/23 Baseline Weight: Weight: 68.1 kg Most recent weight: Weight: 69 kg     Palliative Assessment/Data:  40 %      Time:  90 minutes  Signed by: Jose Creed, NP   Please contact Palliative Medicine Team phone at [phone] for questions and concerns.  For individual provider: See Loretha Stapler

## 2023-05-25 NOTE — Care Management Important Message (Signed)
Important Message  Patient Details  Name: Jose Patel MRN: [id number] Date of Birth: 01/15/1934   Important Message Given:  Yes - Medicare IM     Dorena Bodo 05/25/2023, 2:19 PM

## 2023-05-25 NOTE — Progress Notes (Signed)
PROGRESS NOTE    Jose Patel  QIO:[id number] DOB: 1933-09-20 DOA: 05/22/2023 PCP: [name], NP   Brief Narrative: Jose Patel is a 89 y.o. male with a history of chronic systolic/diastolic heart failure, paroxysmal atrial fibrillation, sick sinus syndrome s/p PPM, diabetes mellitus type 2, primary hypertension, BPH.  Patient presented secondary to shortness of breath and was found to have evidence of acute heart failure. Lasix IV diuresis initiated. Cardiology consulted. Decision made to transition home with hospice.   Assessment and Plan:  Acute on chronic combined systolic and diastolic heart failure Associated troponin elevation up to 231. Lasix IV diuresis initiated. Cardiology consulted. Transthoracic Echocardiogram ordered and pending. Weight of 68.1 kg on admission which appears increased at 69.8 kg. UOP of 750 mL over the last 24 hours documented. -Cardiology recommendations: Continue Lasix IV, palliative care with consideration of home hospice  AKI on CKD stage IIIa Baseline creatinine of 1.2-1.3 from earlier this year. Recent creatinine of 2.29 prior to admission and a creatinine of 1.99 on admission. Likely related to acute heart failure. Creatinine pending -Daily BMP while on Lasix IV  Hyperkalemia Present on admission with potassium of 5.7. Resolved with Lokelma.  Acute anemia Mild. No evidence of acute hemorrhage at this time. Patient is on Xarelto. -Trend CBC  Paroxysmal atrial fibrillation -Continue amiodarone, Toprol XL and Xarelto  Diabetes mellitus, type 2 -Continue SSI  Primary hypertension -Continue metoprolol  GERD -Continue Protonix  BPH Patient is on tamsulosin as an outpatient which is held on admission secondary to borderline hypotension.  Goals of care Patient with end-stage heart failure. Cardiology recommendation for hospice. Palliative care consulted and decision made to discharge home with home hospice.    DVT prophylaxis:  Xarelto Code Status:   Code Status: Full Code Family Communication: None at bedside Disposition Plan: Discharge home in 1 day with hospice   Consultants:  Cardiology Palliative care medicine  Procedures:  None  Antimicrobials: None    Subjective: Patient without specific concerns today.  Objective: BP (!) 101/56   Pulse 60   Temp 97.8 F (36.6 C) (Oral)   Resp 18   Ht 5\' 10"  (1.778 m)   Wt 69 kg   SpO2 94%   BMI 21.83 kg/m   Examination:  General exam: Appears calm and comfortable. Fatigued appearing. Respiratory system: Clear to auscultation. Respiratory effort normal. Cardiovascular system: S1 & S2 heard, RRR. Gastrointestinal system: Abdomen is nondistended, soft and nontender. Normal bowel sounds heard. Central nervous system: Alert and oriented. Psychiatry: Judgement and insight appear normal. Mood & affect appropriate.    Data Reviewed: I have personally reviewed following labs and imaging studies  CBC Lab Results  Component Value Date   WBC 7.3 05/23/2023   RBC 3.41 (L) 05/23/2023   HGB 10.9 (L) 05/23/2023   HCT 33.4 (L) 05/23/2023   MCV 97.9 05/23/2023   MCH 32.0 05/23/2023   PLT 202 05/23/2023   MCHC 32.6 05/23/2023   RDW 15.6 (H) 05/23/2023   LYMPHSABS 1.3 05/23/2023   MONOABS 0.4 05/23/2023   EOSABS 0.3 05/23/2023   BASOSABS 0.0 05/23/2023     Last metabolic panel Lab Results  Component Value Date   NA 138 05/25/2023   K 4.3 05/25/2023   CL 104 05/25/2023   CO2 20 (L) 05/25/2023   BUN 39 (H) 05/25/2023   CREATININE 2.36 (H) 05/25/2023   GLUCOSE 130 (H) 05/25/2023   GFRNONAA 26 (L) 05/25/2023   GFRAA 58 (L) 06/22/2020   CALCIUM 7.6 (  PROGRESS NOTE    Jose Patel  QIO:[id number] DOB: 1933-09-20 DOA: 05/22/2023 PCP: [name], NP   Brief Narrative: Jose Patel is a 89 y.o. male with a history of chronic systolic/diastolic heart failure, paroxysmal atrial fibrillation, sick sinus syndrome s/p PPM, diabetes mellitus type 2, primary hypertension, BPH.  Patient presented secondary to shortness of breath and was found to have evidence of acute heart failure. Lasix IV diuresis initiated. Cardiology consulted. Decision made to transition home with hospice.   Assessment and Plan:  Acute on chronic combined systolic and diastolic heart failure Associated troponin elevation up to 231. Lasix IV diuresis initiated. Cardiology consulted. Transthoracic Echocardiogram ordered and pending. Weight of 68.1 kg on admission which appears increased at 69.8 kg. UOP of 750 mL over the last 24 hours documented. -Cardiology recommendations: Continue Lasix IV, palliative care with consideration of home hospice  AKI on CKD stage IIIa Baseline creatinine of 1.2-1.3 from earlier this year. Recent creatinine of 2.29 prior to admission and a creatinine of 1.99 on admission. Likely related to acute heart failure. Creatinine pending -Daily BMP while on Lasix IV  Hyperkalemia Present on admission with potassium of 5.7. Resolved with Lokelma.  Acute anemia Mild. No evidence of acute hemorrhage at this time. Patient is on Xarelto. -Trend CBC  Paroxysmal atrial fibrillation -Continue amiodarone, Toprol XL and Xarelto  Diabetes mellitus, type 2 -Continue SSI  Primary hypertension -Continue metoprolol  GERD -Continue Protonix  BPH Patient is on tamsulosin as an outpatient which is held on admission secondary to borderline hypotension.  Goals of care Patient with end-stage heart failure. Cardiology recommendation for hospice. Palliative care consulted and decision made to discharge home with home hospice.    DVT prophylaxis:  Xarelto Code Status:   Code Status: Full Code Family Communication: None at bedside Disposition Plan: Discharge home in 1 day with hospice   Consultants:  Cardiology Palliative care medicine  Procedures:  None  Antimicrobials: None    Subjective: Patient without specific concerns today.  Objective: BP (!) 101/56   Pulse 60   Temp 97.8 F (36.6 C) (Oral)   Resp 18   Ht 5\' 10"  (1.778 m)   Wt 69 kg   SpO2 94%   BMI 21.83 kg/m   Examination:  General exam: Appears calm and comfortable. Fatigued appearing. Respiratory system: Clear to auscultation. Respiratory effort normal. Cardiovascular system: S1 & S2 heard, RRR. Gastrointestinal system: Abdomen is nondistended, soft and nontender. Normal bowel sounds heard. Central nervous system: Alert and oriented. Psychiatry: Judgement and insight appear normal. Mood & affect appropriate.    Data Reviewed: I have personally reviewed following labs and imaging studies  CBC Lab Results  Component Value Date   WBC 7.3 05/23/2023   RBC 3.41 (L) 05/23/2023   HGB 10.9 (L) 05/23/2023   HCT 33.4 (L) 05/23/2023   MCV 97.9 05/23/2023   MCH 32.0 05/23/2023   PLT 202 05/23/2023   MCHC 32.6 05/23/2023   RDW 15.6 (H) 05/23/2023   LYMPHSABS 1.3 05/23/2023   MONOABS 0.4 05/23/2023   EOSABS 0.3 05/23/2023   BASOSABS 0.0 05/23/2023     Last metabolic panel Lab Results  Component Value Date   NA 138 05/25/2023   K 4.3 05/25/2023   CL 104 05/25/2023   CO2 20 (L) 05/25/2023   BUN 39 (H) 05/25/2023   CREATININE 2.36 (H) 05/25/2023   GLUCOSE 130 (H) 05/25/2023   GFRNONAA 26 (L) 05/25/2023   GFRAA 58 (L) 06/22/2020   CALCIUM 7.6 (  L) 05/25/2023   PHOS 4.0 05/23/2023   PROT 5.7 (L) 05/23/2023   ALBUMIN 2.6 (L) 05/23/2023   LABGLOB 2.8 12/05/2022   AGRATIO 1.4 12/05/2022   BILITOT 1.1 05/23/2023   ALKPHOS 176 (H) 05/23/2023   AST 18 05/23/2023   ALT 16 05/23/2023   ANIONGAP 14 05/25/2023    GFR: Estimated Creatinine  Clearance: 21.5 mL/min (A) (by C-G formula based on SCr of 2.36 mg/dL (H)).  No results found for this or any previous visit (from the past 240 hour(s)).    Radiology Studies: ECHOCARDIOGRAM COMPLETE  Result Date: 05/23/2023    ECHOCARDIOGRAM REPORT   Patient Name:   Jose Patel Date of Exam: 05/23/2023 Medical Rec #:  [id number]     Height:       70.0 in Accession #:    [id number]    Weight:       150.4 lb Date of Birth:  Sep 24, 1933     BSA:          1.849 m Patient Age:    89 years      BP:           93/59 mmHg Patient Gender: M             HR:           61 bpm. Exam Location:  Inpatient Procedure: 2D Echo, 3D Echo, Cardiac Doppler, Color Doppler, Strain Analysis and            Intracardiac Opacification Agent Indications:     CHF I50.9  History:         Patient has prior history of Echocardiogram examinations, most                  recent 09/15/2022. CHF, CAD, Pacemaker, Arrythmias:Atrial                  Fibrillation, Signs/Symptoms:Shortness of Breath; Risk                  Factors:Hypertension, Dyslipidemia and Diabetes.  Sonographer:     Dondra Prader RVT RCS Referring Phys:  [id number] Tessa Lerner Diagnosing Phys: Arvilla Meres MD IMPRESSIONS  1. Left ventricular ejection fraction, by estimation, is 20 to 25%. The left ventricle has severely decreased function. The left ventricle demonstrates regional wall motion abnormalities (see scoring diagram/findings for description). The left ventricular internal cavity size was mildly dilated. Left ventricular diastolic parameters are consistent with Grade II diastolic dysfunction (pseudonormalization).  2. Right ventricular systolic function is moderately reduced. The right ventricular size is normal.  3. Left atrial size was severely dilated.  4. Right atrial size was moderately dilated.  5. The mitral valve is normal in structure. Moderate to severe mitral valve regurgitation. No evidence of mitral stenosis.  6. Tricuspid valve regurgitation is mild to  moderate.  7. The aortic valve is normal in structure. There is mild calcification of the aortic valve. Aortic valve regurgitation is severe. Aortic valve sclerosis/calcification is present, without any evidence of aortic stenosis.  8. Aortic dilatation noted. There is mild dilatation of the ascending aorta, measuring 43 mm.  9. The inferior vena cava is dilated in size with <50% respiratory variability, suggesting right atrial pressure of 15 mmHg. Comparison(s): EF 25-30%. FINDINGS  Left Ventricle: Left ventricular ejection fraction, by estimation, is 20 to 25%. The left ventricle has severely decreased function. The left ventricle demonstrates regional wall motion abnormalities. Definity contrast agent was given IV to delineate the left ventricular endocardial  L) 05/25/2023   PHOS 4.0 05/23/2023   PROT 5.7 (L) 05/23/2023   ALBUMIN 2.6 (L) 05/23/2023   LABGLOB 2.8 12/05/2022   AGRATIO 1.4 12/05/2022   BILITOT 1.1 05/23/2023   ALKPHOS 176 (H) 05/23/2023   AST 18 05/23/2023   ALT 16 05/23/2023   ANIONGAP 14 05/25/2023    GFR: Estimated Creatinine  Clearance: 21.5 mL/min (A) (by C-G formula based on SCr of 2.36 mg/dL (H)).  No results found for this or any previous visit (from the past 240 hour(s)).    Radiology Studies: ECHOCARDIOGRAM COMPLETE  Result Date: 05/23/2023    ECHOCARDIOGRAM REPORT   Patient Name:   Jose Patel Date of Exam: 05/23/2023 Medical Rec #:  [id number]     Height:       70.0 in Accession #:    [id number]    Weight:       150.4 lb Date of Birth:  Sep 24, 1933     BSA:          1.849 m Patient Age:    89 years      BP:           93/59 mmHg Patient Gender: M             HR:           61 bpm. Exam Location:  Inpatient Procedure: 2D Echo, 3D Echo, Cardiac Doppler, Color Doppler, Strain Analysis and            Intracardiac Opacification Agent Indications:     CHF I50.9  History:         Patient has prior history of Echocardiogram examinations, most                  recent 09/15/2022. CHF, CAD, Pacemaker, Arrythmias:Atrial                  Fibrillation, Signs/Symptoms:Shortness of Breath; Risk                  Factors:Hypertension, Dyslipidemia and Diabetes.  Sonographer:     Dondra Prader RVT RCS Referring Phys:  [id number] Tessa Lerner Diagnosing Phys: Arvilla Meres MD IMPRESSIONS  1. Left ventricular ejection fraction, by estimation, is 20 to 25%. The left ventricle has severely decreased function. The left ventricle demonstrates regional wall motion abnormalities (see scoring diagram/findings for description). The left ventricular internal cavity size was mildly dilated. Left ventricular diastolic parameters are consistent with Grade II diastolic dysfunction (pseudonormalization).  2. Right ventricular systolic function is moderately reduced. The right ventricular size is normal.  3. Left atrial size was severely dilated.  4. Right atrial size was moderately dilated.  5. The mitral valve is normal in structure. Moderate to severe mitral valve regurgitation. No evidence of mitral stenosis.  6. Tricuspid valve regurgitation is mild to  moderate.  7. The aortic valve is normal in structure. There is mild calcification of the aortic valve. Aortic valve regurgitation is severe. Aortic valve sclerosis/calcification is present, without any evidence of aortic stenosis.  8. Aortic dilatation noted. There is mild dilatation of the ascending aorta, measuring 43 mm.  9. The inferior vena cava is dilated in size with <50% respiratory variability, suggesting right atrial pressure of 15 mmHg. Comparison(s): EF 25-30%. FINDINGS  Left Ventricle: Left ventricular ejection fraction, by estimation, is 20 to 25%. The left ventricle has severely decreased function. The left ventricle demonstrates regional wall motion abnormalities. Definity contrast agent was given IV to delineate the left ventricular endocardial

## 2023-05-25 NOTE — Plan of Care (Signed)

## 2023-05-25 NOTE — Progress Notes (Signed)
Physical Therapy Treatment Patient Details Name: Jose Patel MRN: [id number] DOB: September 15, 1933 Today's Date: 05/25/2023   History of Present Illness 89 y/o male presents to [facility] hospital on 05/22/2023 with SOB, admitted for management of acute on chronic CHF. Echocardiogram on 10/23 was consistent with interval extensive anterior wall myocardial infarction. PMHx: HTN, CHF, DM type 2, Afib, hx of sinus node dysfunction s/p pacemaker placement, aortic insufficiency, sleep apnea, hx of stroke    PT Comments  Pt in bed upon arrival and agreeable to limited PT session. Pt reported feeling fatigued and did not want to move from the bed. With encouragement, pt worked on general strengthening in today's session. Pt is progressing slowly towards goals. Acute PT to follow.      If plan is discharge home, recommend the following: A little help with bathing/dressing/bathroom;Assistance with cooking/housework;Assist for transportation;Help with stairs or ramp for entrance     Equipment Recommendations  None recommended by PT       Precautions / Restrictions Precautions Precautions: Fall Restrictions Weight Bearing Restrictions: No     Mobility  Bed Mobility  General bed mobility comments: declined, pt requested to do exercises in bed         Cognition Arousal: Alert Behavior During Therapy: [facility] for tasks assessed/performed Overall Cognitive Status: Within Functional Limits for tasks assessed     Exercises General Exercises - Upper Extremity Shoulder Flexion: AROM, Both, Supine (3 reps) Elbow Flexion: AROM, Both, 5 reps, Supine General Exercises - Lower Extremity Ankle Circles/Pumps: AROM, Both, 5 reps, Supine Long Arc Quad: AROM, Both (3 reps) Straight Leg Raises: AROM, Both, 5 reps, Supine    General Comments General comments (skin integrity, edema, etc.): VSS on RA      Pertinent Vitals/Pain Pain Assessment Pain Assessment: No/denies pain     PT Goals (current goals can now  be found in the care plan section) Progress towards PT goals: Progressing toward goals    Frequency    Min 1X/week       AM-PAC PT "6 Clicks" Mobility   Outcome Measure  Help needed turning from your back to your side while in a flat bed without using bedrails?: None Help needed moving from lying on your back to sitting on the side of a flat bed without using bedrails?: None Help needed moving to and from a bed to a chair (including a wheelchair)?: None Help needed standing up from a chair using your arms (e.g., wheelchair or bedside chair)?: None Help needed to walk in hospital room?: A Little Help needed climbing 3-5 steps with a railing? : Total 6 Click Score: 20    End of Session   Activity Tolerance: Patient limited by fatigue Patient left: in bed;with call bell/phone within reach Nurse Communication: Mobility status PT Visit Diagnosis: Other abnormalities of gait and mobility (R26.89);Muscle weakness (generalized) (M62.81)     Time: 4401-0272 PT Time Calculation (min) (ACUTE ONLY): 11 min  Charges:    $Therapeutic Exercise: 8-22 mins PT General Charges $$ ACUTE PT VISIT: 1 Visit                     Hilton Cork, PT, DPT Secure Chat Preferred  Rehab Office [phone]   Arturo Morton Brion Aliment 05/25/2023, 4:56 PM

## 2023-05-25 NOTE — Progress Notes (Signed)
   Patient Name: Jose Patel Date of Encounter: 05/25/2023 [facility] Cardiologist: Thurmon Fair, MD   Interval Summary  .    Denies dyspnea.  Breathing is a little raspy.  Wants to go home.  Vital Signs .    Vitals:   05/24/23 1947 05/25/23 0234 05/25/23 0601 05/25/23 0732  BP: (!) 97/57 103/63 101/65 100/67  Pulse: (!) 59 60 61 60  Resp: 17  17 17   Temp: 97.6 F (36.4 C) 97.6 F (36.4 C) 97.8 F (36.6 C) 97.8 F (36.6 C)  TempSrc: Oral  Oral Oral  SpO2: 97% 98% 99% 95%  Weight:   69 kg   Height:        Intake/Output Summary (Last 24 hours) at 05/25/2023 0855 Last data filed at 05/25/2023 0600 Gross per 24 hour  Intake 0 ml  Output 600 ml  Net -600 ml      05/25/2023    6:01 AM 05/24/2023    4:12 AM 05/23/2023   10:00 AM  Last 3 Weights  Weight (lbs) 152 lb 1.9 oz 153 lb 14.1 oz 150 lb 6.4 oz  Weight (kg) 69 kg 69.8 kg 68.221 kg      Telemetry/ECG    Atrial paced, ventricular sensed rhythm, rare PVCs- Personally Reviewed  Physical Exam .   GEN: No acute distress.  Appears weak.  Healthy pacemaker site. Neck: No JVD Cardiac: RRR, no murmurs, rubs, or gallops.  Respiratory: Clear to auscultation bilaterally. GI: Soft, nontender, non-distended  MS: No edema  Assessment & Plan .     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  Low blood pressure limits use of heart failure medications.   Evidence of low cardiac output with renal insufficiency.  No evidence of ongoing ischemia.  Appears clinically euvolemic.  Intravenous diuretics stopped today and placed on oral furosemide.  He does not want to undergo any invasive/aggressive interventions.  He has DNR status.  Family is in agreement with a palliative care approach.  [facility] will sign off.   Medication Recommendations:   Amiodarone 200 mg daily Jardiance 10 mg daily Torsemide 20 mg daily Potassium chloride 10 mEq daily Metoprolol succinate 12.5 mg daily (new dose) Xarelto 15 mg daily (new dose) Stopped Entresto, spironolactone, isosorbide mononitrate that he was taking before this admission Other recommendations (labs, testing, etc): Will check a basic metabolic panel at his next office appointment Follow up as an outpatient: Already has an appointment scheduled for 06/06/2023  For questions or updates, please contact [facility] Please consult [email or web address] for contact info under        Signed, Thurmon Fair, MD

## 2023-05-26 DIAGNOSIS — I5043 Acute on chronic combined systolic (congestive) and diastolic (congestive) heart failure: Secondary | ICD-10-CM | POA: Diagnosis not present

## 2023-05-26 DIAGNOSIS — R0609 Other forms of dyspnea: Secondary | ICD-10-CM | POA: Diagnosis not present

## 2023-05-26 DIAGNOSIS — Z515 Encounter for palliative care: Secondary | ICD-10-CM | POA: Diagnosis not present

## 2023-05-26 LAB — BASIC METABOLIC PANEL
Anion gap: 11 (ref 5–15)
BUN: 46 mg/dL — ABNORMAL HIGH (ref 8–23)
CO2: 21 mmol/L — ABNORMAL LOW (ref 22–32)
Calcium: 8 mg/dL — ABNORMAL LOW (ref 8.9–10.3)
Chloride: 105 mmol/L (ref 98–111)
Creatinine, Ser: 2.66 mg/dL — ABNORMAL HIGH (ref 0.61–1.24)
GFR, Estimated: 23 mL/min — ABNORMAL LOW (ref >60)
Glucose, Bld: 124 mg/dL — ABNORMAL HIGH (ref 70–99)
Potassium: 4.4 mmol/L (ref 3.5–5.1)
Sodium: 137 mmol/L (ref 135–145)

## 2023-05-26 MED ORDER — FUROSEMIDE 40 MG PO TABS: 40.0000 mg | ORAL_TABLET | Freq: Every day | ORAL | 0 refills | Status: DC

## 2023-05-26 MED ORDER — MORPHINE SULFATE (CONCENTRATE) 10 MG/0.5ML PO SOLN: 5.0000 mg | ORAL | 0 refills | Status: DC | PRN

## 2023-05-26 MED ORDER — RIVAROXABAN 15 MG PO TABS: 15.0000 mg | ORAL_TABLET | Freq: Every day | ORAL | 0 refills | Status: DC

## 2023-05-26 MED ORDER — MORPHINE SULFATE (CONCENTRATE) 10 MG/0.5ML PO SOLN
5.0000 mg | ORAL | Status: DC | PRN
  Administered 2023-05-26: 5 mg via ORAL
  Filled 2023-05-26: qty 0.5

## 2023-05-26 MED ORDER — METOPROLOL SUCCINATE ER 25 MG PO TB24: 12.5000 mg | ORAL_TABLET | Freq: Every day | ORAL | Status: DC

## 2023-05-26 NOTE — Discharge Instructions (Signed)
Jose Patel,  You were here with heart failure. We attempted to diurese you but were not very successful. You have decided to transition to hospice care. I have provided new prescription based on cardiology recommendations, however if continuing Xarelto does not coincide with your goals of care, please forego picking up this prescription.

## 2023-05-26 NOTE — TOC Transition Note (Signed)
Transition of Care [facility]) - CM/SW Discharge Note   Patient Details  Name: Jose Patel MRN: [id number] Date of Birth: September 11, 1933  [facility] CM/SW Contact:  Lawerance Sabal, RN Phone Number: 05/26/2023, 10:25 AM   Clinical Narrative:     Sherron Monday w daughters and Oleta Mouse [phone] [facility] who states that they have set up transport with [facility] for 5pm.  I will place transportation forms on chart and ensure DNR is filled out.   Final next level of care: Home w Hospice Care Barriers to Discharge: No Barriers Identified   Patient Goals and CMS Choice   Choice offered to / list presented to : NA  Discharge Placement                         Discharge Plan and Services Additional resources added to the After Visit Summary for   In-house Referral: NA Discharge Planning Services: CM Consult Post Acute Care Choice: NA          DME Arranged: N/A DME Agency: NA       HH Arranged: NA       Representative spoke with at [facility] Agency: [facility]  Social Determinants of Health (SDOH) Interventions SDOH Screenings   Food Insecurity: No Food Insecurity (05/22/2023)  Housing: Low Risk  (05/22/2023)  Transportation Needs: No Transportation Needs (05/22/2023)  Utilities: Not At Risk (05/22/2023)  Alcohol Screen: Low Risk  (06/10/2018)  Depression (PHQ2-9): Low Risk  (08/31/2022)  Financial Resource Strain: Low Risk  (12/01/2022)  Physical Activity: Unknown (12/01/2022)  Social Connections: Socially Isolated (12/01/2022)  Stress: No Stress Concern Present (12/01/2022)  Tobacco Use: Medium Risk (05/22/2023)     Readmission Risk Interventions    05/23/2023    4:34 PM  Readmission Risk Prevention Plan  Transportation Screening Complete  PCP or Specialist Appt within 3-5 Days Complete  HRI or Home Care Consult Complete  Medication Review (RN Care Manager) Complete

## 2023-05-26 NOTE — Discharge Summary (Addendum)
Physician Discharge Summary   Patient: Kelso Schnack MRN: [id number] DOB: 11-May-1934  Admit date:     05/22/2023  Discharge date: 05/26/23  Discharge Physician: Jacquelin Hawking, MD   PCP: [name], NP   Recommendations at discharge:  Hospice  Discharge Diagnoses: Principal Problem:   Acute on chronic combined systolic and diastolic CHF (congestive heart failure) (HCC) Active Problems:   Essential hypertension   DM2 (diabetes mellitus, type 2) (HCC)   Paroxysmal atrial fibrillation (HCC)   Cardiac pacemaker in situ   Elevated troponin   AKI (acute kidney injury) (HCC)   Elevated troponin level not due myocardial infarction   Hyperkalemia   Long term current use of antiarrhythmic drug   Acute anemia   GERD (gastroesophageal reflux disease)   BPH (benign prostatic hyperplasia)  Resolved Problems:   * No resolved hospital problems. *  Hospital Course: Ammaar Peaslee is a 89 y.o. male with a history of chronic systolic/diastolic heart failure, paroxysmal atrial fibrillation, sick sinus syndrome s/p PPM, diabetes mellitus type 2, primary hypertension, BPH.  Patient presented secondary to shortness of breath and was found to have evidence of acute heart failure. Lasix IV diuresis initiated. Cardiology consulted. Decision made to transition home with hospice.  Assessment and Plan:  Acute on chronic combined systolic and diastolic heart failure Associated troponin elevation up to 231. Lasix IV diuresis initiated. Cardiology consulted. Transthoracic Echocardiogram ordered with evidence of LVEF of 20-25%. Patient did not respond to diuresis and recommendation from cardiology to transition to hospice.   AKI on CKD stage IIIa Baseline creatinine of 1.2-1.3 from earlier this year. Recent creatinine of 2.29 prior to admission and a creatinine of 1.99 on admission. Likely related to acute heart failure. Worsened with diuresis.   Hyperkalemia Present on admission with potassium of  5.7. Resolved with Lokelma.   Acute anemia Mild. No evidence of acute hemorrhage at this time. Patient is on Xarelto.   Paroxysmal atrial fibrillation Continue amiodarone, Toprol XL and Xarelto   Diabetes mellitus, type 2 Continue Jardiance   Primary hypertension Continue metoprolol   GERD Continue omeprazole   BPH Patient is on tamsulosin as an outpatient which is held on admission secondary to borderline hypotension.   Goals of care Patient with end-stage heart failure. Cardiology recommendation for hospice. Palliative care consulted and decision made to discharge home with home hospice.   Consultants:  Cardiology Palliative care medicine   Procedures:  Transthoracic Echocardiogram  Disposition: Home with hospice Diet recommendation: Regular diet   DISCHARGE MEDICATION: Allergies as of 05/26/2023       Reactions   Aricept [donepezil] Diarrhea, Nausea And Vomiting, Other (See Comments)   Cannot tolerate- bradycardia, also   Tropicamide Nausea And Vomiting   Used to dilate eyes (might be this- was switched to something this was tolerable, after this)   Adhesive [tape] Rash        Medication List     STOP taking these medications    Entresto 49-51 MG Generic drug: sacubitril-valsartan   isosorbide mononitrate 30 MG 24 hr tablet Commonly known as: IMDUR   spironolactone 25 MG tablet Commonly known as: ALDACTONE   tamsulosin 0.4 MG Caps capsule Commonly known as: FLOMAX   torsemide 10 MG tablet Commonly known as: DEMADEX   TRAZODONE HCL PO       TAKE these medications    amiodarone 100 MG tablet Commonly known as: PACERONE Take 1 tablet (100 mg total) by mouth daily.   diphenhydramine-acetaminophen 25-500 MG Tabs tablet  Physician Discharge Summary   Patient: Kelso Schnack MRN: [id number] DOB: 11-May-1934  Admit date:     05/22/2023  Discharge date: 05/26/23  Discharge Physician: Jacquelin Hawking, MD   PCP: [name], NP   Recommendations at discharge:  Hospice  Discharge Diagnoses: Principal Problem:   Acute on chronic combined systolic and diastolic CHF (congestive heart failure) (HCC) Active Problems:   Essential hypertension   DM2 (diabetes mellitus, type 2) (HCC)   Paroxysmal atrial fibrillation (HCC)   Cardiac pacemaker in situ   Elevated troponin   AKI (acute kidney injury) (HCC)   Elevated troponin level not due myocardial infarction   Hyperkalemia   Long term current use of antiarrhythmic drug   Acute anemia   GERD (gastroesophageal reflux disease)   BPH (benign prostatic hyperplasia)  Resolved Problems:   * No resolved hospital problems. *  Hospital Course: Ammaar Peaslee is a 89 y.o. male with a history of chronic systolic/diastolic heart failure, paroxysmal atrial fibrillation, sick sinus syndrome s/p PPM, diabetes mellitus type 2, primary hypertension, BPH.  Patient presented secondary to shortness of breath and was found to have evidence of acute heart failure. Lasix IV diuresis initiated. Cardiology consulted. Decision made to transition home with hospice.  Assessment and Plan:  Acute on chronic combined systolic and diastolic heart failure Associated troponin elevation up to 231. Lasix IV diuresis initiated. Cardiology consulted. Transthoracic Echocardiogram ordered with evidence of LVEF of 20-25%. Patient did not respond to diuresis and recommendation from cardiology to transition to hospice.   AKI on CKD stage IIIa Baseline creatinine of 1.2-1.3 from earlier this year. Recent creatinine of 2.29 prior to admission and a creatinine of 1.99 on admission. Likely related to acute heart failure. Worsened with diuresis.   Hyperkalemia Present on admission with potassium of  5.7. Resolved with Lokelma.   Acute anemia Mild. No evidence of acute hemorrhage at this time. Patient is on Xarelto.   Paroxysmal atrial fibrillation Continue amiodarone, Toprol XL and Xarelto   Diabetes mellitus, type 2 Continue Jardiance   Primary hypertension Continue metoprolol   GERD Continue omeprazole   BPH Patient is on tamsulosin as an outpatient which is held on admission secondary to borderline hypotension.   Goals of care Patient with end-stage heart failure. Cardiology recommendation for hospice. Palliative care consulted and decision made to discharge home with home hospice.   Consultants:  Cardiology Palliative care medicine   Procedures:  Transthoracic Echocardiogram  Disposition: Home with hospice Diet recommendation: Regular diet   DISCHARGE MEDICATION: Allergies as of 05/26/2023       Reactions   Aricept [donepezil] Diarrhea, Nausea And Vomiting, Other (See Comments)   Cannot tolerate- bradycardia, also   Tropicamide Nausea And Vomiting   Used to dilate eyes (might be this- was switched to something this was tolerable, after this)   Adhesive [tape] Rash        Medication List     STOP taking these medications    Entresto 49-51 MG Generic drug: sacubitril-valsartan   isosorbide mononitrate 30 MG 24 hr tablet Commonly known as: IMDUR   spironolactone 25 MG tablet Commonly known as: ALDACTONE   tamsulosin 0.4 MG Caps capsule Commonly known as: FLOMAX   torsemide 10 MG tablet Commonly known as: DEMADEX   TRAZODONE HCL PO       TAKE these medications    amiodarone 100 MG tablet Commonly known as: PACERONE Take 1 tablet (100 mg total) by mouth daily.   diphenhydramine-acetaminophen 25-500 MG Tabs tablet  Physician Discharge Summary   Patient: Kelso Schnack MRN: [id number] DOB: 11-May-1934  Admit date:     05/22/2023  Discharge date: 05/26/23  Discharge Physician: Jacquelin Hawking, MD   PCP: [name], NP   Recommendations at discharge:  Hospice  Discharge Diagnoses: Principal Problem:   Acute on chronic combined systolic and diastolic CHF (congestive heart failure) (HCC) Active Problems:   Essential hypertension   DM2 (diabetes mellitus, type 2) (HCC)   Paroxysmal atrial fibrillation (HCC)   Cardiac pacemaker in situ   Elevated troponin   AKI (acute kidney injury) (HCC)   Elevated troponin level not due myocardial infarction   Hyperkalemia   Long term current use of antiarrhythmic drug   Acute anemia   GERD (gastroesophageal reflux disease)   BPH (benign prostatic hyperplasia)  Resolved Problems:   * No resolved hospital problems. *  Hospital Course: Ammaar Peaslee is a 89 y.o. male with a history of chronic systolic/diastolic heart failure, paroxysmal atrial fibrillation, sick sinus syndrome s/p PPM, diabetes mellitus type 2, primary hypertension, BPH.  Patient presented secondary to shortness of breath and was found to have evidence of acute heart failure. Lasix IV diuresis initiated. Cardiology consulted. Decision made to transition home with hospice.  Assessment and Plan:  Acute on chronic combined systolic and diastolic heart failure Associated troponin elevation up to 231. Lasix IV diuresis initiated. Cardiology consulted. Transthoracic Echocardiogram ordered with evidence of LVEF of 20-25%. Patient did not respond to diuresis and recommendation from cardiology to transition to hospice.   AKI on CKD stage IIIa Baseline creatinine of 1.2-1.3 from earlier this year. Recent creatinine of 2.29 prior to admission and a creatinine of 1.99 on admission. Likely related to acute heart failure. Worsened with diuresis.   Hyperkalemia Present on admission with potassium of  5.7. Resolved with Lokelma.   Acute anemia Mild. No evidence of acute hemorrhage at this time. Patient is on Xarelto.   Paroxysmal atrial fibrillation Continue amiodarone, Toprol XL and Xarelto   Diabetes mellitus, type 2 Continue Jardiance   Primary hypertension Continue metoprolol   GERD Continue omeprazole   BPH Patient is on tamsulosin as an outpatient which is held on admission secondary to borderline hypotension.   Goals of care Patient with end-stage heart failure. Cardiology recommendation for hospice. Palliative care consulted and decision made to discharge home with home hospice.   Consultants:  Cardiology Palliative care medicine   Procedures:  Transthoracic Echocardiogram  Disposition: Home with hospice Diet recommendation: Regular diet   DISCHARGE MEDICATION: Allergies as of 05/26/2023       Reactions   Aricept [donepezil] Diarrhea, Nausea And Vomiting, Other (See Comments)   Cannot tolerate- bradycardia, also   Tropicamide Nausea And Vomiting   Used to dilate eyes (might be this- was switched to something this was tolerable, after this)   Adhesive [tape] Rash        Medication List     STOP taking these medications    Entresto 49-51 MG Generic drug: sacubitril-valsartan   isosorbide mononitrate 30 MG 24 hr tablet Commonly known as: IMDUR   spironolactone 25 MG tablet Commonly known as: ALDACTONE   tamsulosin 0.4 MG Caps capsule Commonly known as: FLOMAX   torsemide 10 MG tablet Commonly known as: DEMADEX   TRAZODONE HCL PO       TAKE these medications    amiodarone 100 MG tablet Commonly known as: PACERONE Take 1 tablet (100 mg total) by mouth daily.   diphenhydramine-acetaminophen 25-500 MG Tabs tablet  Physician Discharge Summary   Patient: Kelso Schnack MRN: [id number] DOB: 11-May-1934  Admit date:     05/22/2023  Discharge date: 05/26/23  Discharge Physician: Jacquelin Hawking, MD   PCP: [name], NP   Recommendations at discharge:  Hospice  Discharge Diagnoses: Principal Problem:   Acute on chronic combined systolic and diastolic CHF (congestive heart failure) (HCC) Active Problems:   Essential hypertension   DM2 (diabetes mellitus, type 2) (HCC)   Paroxysmal atrial fibrillation (HCC)   Cardiac pacemaker in situ   Elevated troponin   AKI (acute kidney injury) (HCC)   Elevated troponin level not due myocardial infarction   Hyperkalemia   Long term current use of antiarrhythmic drug   Acute anemia   GERD (gastroesophageal reflux disease)   BPH (benign prostatic hyperplasia)  Resolved Problems:   * No resolved hospital problems. *  Hospital Course: Ammaar Peaslee is a 89 y.o. male with a history of chronic systolic/diastolic heart failure, paroxysmal atrial fibrillation, sick sinus syndrome s/p PPM, diabetes mellitus type 2, primary hypertension, BPH.  Patient presented secondary to shortness of breath and was found to have evidence of acute heart failure. Lasix IV diuresis initiated. Cardiology consulted. Decision made to transition home with hospice.  Assessment and Plan:  Acute on chronic combined systolic and diastolic heart failure Associated troponin elevation up to 231. Lasix IV diuresis initiated. Cardiology consulted. Transthoracic Echocardiogram ordered with evidence of LVEF of 20-25%. Patient did not respond to diuresis and recommendation from cardiology to transition to hospice.   AKI on CKD stage IIIa Baseline creatinine of 1.2-1.3 from earlier this year. Recent creatinine of 2.29 prior to admission and a creatinine of 1.99 on admission. Likely related to acute heart failure. Worsened with diuresis.   Hyperkalemia Present on admission with potassium of  5.7. Resolved with Lokelma.   Acute anemia Mild. No evidence of acute hemorrhage at this time. Patient is on Xarelto.   Paroxysmal atrial fibrillation Continue amiodarone, Toprol XL and Xarelto   Diabetes mellitus, type 2 Continue Jardiance   Primary hypertension Continue metoprolol   GERD Continue omeprazole   BPH Patient is on tamsulosin as an outpatient which is held on admission secondary to borderline hypotension.   Goals of care Patient with end-stage heart failure. Cardiology recommendation for hospice. Palliative care consulted and decision made to discharge home with home hospice.   Consultants:  Cardiology Palliative care medicine   Procedures:  Transthoracic Echocardiogram  Disposition: Home with hospice Diet recommendation: Regular diet   DISCHARGE MEDICATION: Allergies as of 05/26/2023       Reactions   Aricept [donepezil] Diarrhea, Nausea And Vomiting, Other (See Comments)   Cannot tolerate- bradycardia, also   Tropicamide Nausea And Vomiting   Used to dilate eyes (might be this- was switched to something this was tolerable, after this)   Adhesive [tape] Rash        Medication List     STOP taking these medications    Entresto 49-51 MG Generic drug: sacubitril-valsartan   isosorbide mononitrate 30 MG 24 hr tablet Commonly known as: IMDUR   spironolactone 25 MG tablet Commonly known as: ALDACTONE   tamsulosin 0.4 MG Caps capsule Commonly known as: FLOMAX   torsemide 10 MG tablet Commonly known as: DEMADEX   TRAZODONE HCL PO       TAKE these medications    amiodarone 100 MG tablet Commonly known as: PACERONE Take 1 tablet (100 mg total) by mouth daily.   diphenhydramine-acetaminophen 25-500 MG Tabs tablet  Commonly known as: TYLENOL PM Take 1 tablet by mouth at bedtime as needed.   empagliflozin 10 MG Tabs tablet Commonly known as: Jardiance Take 1 tablet (10 mg total) by mouth daily before breakfast.   furosemide 40 MG  tablet Commonly known as: LASIX Take 1 tablet (40 mg total) by mouth daily. Start taking on: May 27, 2023   Melatonin 5 MG Chew Chew 5 mg by mouth daily as needed (sleep).   memantine 10 MG tablet Commonly known as: NAMENDA TAKE 1 TABLET BY MOUTH TWICE A DAY   metoprolol succinate 25 MG 24 hr tablet Commonly known as: TOPROL-XL Take 0.5 tablets (12.5 mg total) by mouth daily. Start taking on: May 27, 2023   morphine CONCENTRATE 10 MG/0.5ML Soln concentrated solution Take 0.25 mLs (5 mg total) by mouth every 2 (two) hours as needed for shortness of breath.   omeprazole 20 MG capsule Commonly known as: PRILOSEC Take 1 capsule (20 mg total) by mouth daily.   potassium chloride 10 MEQ tablet Commonly known as: KLOR-CON TAKE 1 TABLET BY MOUTH EVERY DAY   Rivaroxaban 15 MG Tabs tablet Commonly known as: XARELTO Take 1 tablet (15 mg total) by mouth daily with supper. What changed:  medication strength how much to take how to take this when to take this additional instructions   Vitamin D 125 MCG (5000 UT) Caps Take 5,000 Units by mouth daily at 6 (six) AM.        Follow-up Information     [name], NP Follow up.   Specialty: Geriatric Medicine Why: GO: OCT. 31 AT 11AM Contact information: [address] [place] [address] [phone]                Discharge Exam: BP 100/60 (BP Location: Right Arm)   Pulse 60   Temp 98.3 F (36.8 C) (Oral)   Resp 18   Ht 5\' 10"  (1.778 m)   Wt 70.2 kg   SpO2 98%   BMI 22.21 kg/m   General exam: Appears calm and comfortable Respiratory system: Respiratory effort normal. Central nervous system: Alert and oriented. Psychiatry: Judgement and insight appear normal. Mood & affect appropriate.   Condition at discharge: Comfort measures  The results of significant diagnostics from this hospitalization (including imaging, microbiology, ancillary and laboratory) are listed below for reference.    Imaging Studies: ECHOCARDIOGRAM COMPLETE  Result Date: 05/23/2023    ECHOCARDIOGRAM REPORT   Patient Name:   ATHONY BEIGHLEY Date of Exam: 05/23/2023 Medical Rec #:  [id number]     Height:       70.0 in Accession #:    [id number]    Weight:       150.4 lb Date of Birth:  1934-01-01     BSA:          1.849 m Patient Age:    89 years      BP:           93/59 mmHg Patient Gender: M             HR:           61 bpm. Exam Location:  Inpatient Procedure: 2D Echo, 3D Echo, Cardiac Doppler, Color Doppler, Strain Analysis and            Intracardiac Opacification Agent Indications:     CHF I50.9  History:         Patient has prior history of Echocardiogram examinations, most  Commonly known as: TYLENOL PM Take 1 tablet by mouth at bedtime as needed.   empagliflozin 10 MG Tabs tablet Commonly known as: Jardiance Take 1 tablet (10 mg total) by mouth daily before breakfast.   furosemide 40 MG  tablet Commonly known as: LASIX Take 1 tablet (40 mg total) by mouth daily. Start taking on: May 27, 2023   Melatonin 5 MG Chew Chew 5 mg by mouth daily as needed (sleep).   memantine 10 MG tablet Commonly known as: NAMENDA TAKE 1 TABLET BY MOUTH TWICE A DAY   metoprolol succinate 25 MG 24 hr tablet Commonly known as: TOPROL-XL Take 0.5 tablets (12.5 mg total) by mouth daily. Start taking on: May 27, 2023   morphine CONCENTRATE 10 MG/0.5ML Soln concentrated solution Take 0.25 mLs (5 mg total) by mouth every 2 (two) hours as needed for shortness of breath.   omeprazole 20 MG capsule Commonly known as: PRILOSEC Take 1 capsule (20 mg total) by mouth daily.   potassium chloride 10 MEQ tablet Commonly known as: KLOR-CON TAKE 1 TABLET BY MOUTH EVERY DAY   Rivaroxaban 15 MG Tabs tablet Commonly known as: XARELTO Take 1 tablet (15 mg total) by mouth daily with supper. What changed:  medication strength how much to take how to take this when to take this additional instructions   Vitamin D 125 MCG (5000 UT) Caps Take 5,000 Units by mouth daily at 6 (six) AM.        Follow-up Information     [name], NP Follow up.   Specialty: Geriatric Medicine Why: GO: OCT. 31 AT 11AM Contact information: [address] [place] [address] [phone]                Discharge Exam: BP 100/60 (BP Location: Right Arm)   Pulse 60   Temp 98.3 F (36.8 C) (Oral)   Resp 18   Ht 5\' 10"  (1.778 m)   Wt 70.2 kg   SpO2 98%   BMI 22.21 kg/m   General exam: Appears calm and comfortable Respiratory system: Respiratory effort normal. Central nervous system: Alert and oriented. Psychiatry: Judgement and insight appear normal. Mood & affect appropriate.   Condition at discharge: Comfort measures  The results of significant diagnostics from this hospitalization (including imaging, microbiology, ancillary and laboratory) are listed below for reference.    Imaging Studies: ECHOCARDIOGRAM COMPLETE  Result Date: 05/23/2023    ECHOCARDIOGRAM REPORT   Patient Name:   ATHONY BEIGHLEY Date of Exam: 05/23/2023 Medical Rec #:  [id number]     Height:       70.0 in Accession #:    [id number]    Weight:       150.4 lb Date of Birth:  1934-01-01     BSA:          1.849 m Patient Age:    89 years      BP:           93/59 mmHg Patient Gender: M             HR:           61 bpm. Exam Location:  Inpatient Procedure: 2D Echo, 3D Echo, Cardiac Doppler, Color Doppler, Strain Analysis and            Intracardiac Opacification Agent Indications:     CHF I50.9  History:         Patient has prior history of Echocardiogram examinations, most

## 2023-05-26 NOTE — Progress Notes (Signed)
Patient ID: Jose Patel, male   DOB: January 22, 1934, 89 y.o.   MRN: [id number]    Progress Note from the Palliative Medicine Team at [facility]   Patient Name: Jose Patel        Date: 05/26/2023 DOB: 07/03/1934  Age: 89 y.o. MRN#: [id number] Attending Physician: Narda Bonds, MD Primary Care Physician: [name], NP Admit Date: 05/22/2023   Reason for Consultation/Follow-up   Establishing Goals of Care   HPI/ Brief Hospital Review  89 y.o. 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  Low blood pressure limits use of heart failure medications.  Evidence of low cardiac output with renal insufficiency.  No evidence of ongoing ischemia.    Patient and family face treatment option decisions, advanced directive decisions and anticipatory care needs.   Subjective  Extensive chart review has been completed prior to meeting with patient/family  including labs, vital signs, imaging, progress/consult notes, orders, medications and available advance directive documents.    This NP assessed patient at the bedside as a follow up to  yesterday's GOCs meeting.  Yesterday decision was made in collaboration with patient and his daughter to transition home with hospice services.    Patient is a bit weaker today, he reports feeling comfortable.  Education offered on utilization of as needed medications for symptom management specific to  dyspnea.  Education offered on the natural trajectory of heart failure as a person transitions at end-of-life.  Education offered on hospice benefit; philosophy and eligibility  I was able to speak with [facility] regarding the logistics of transfer home.  Hospice liaison spoke with daughter, arranged transportation and assured hospice admission on return home.  I discussed all of these details with Dr. Caleb Popp and transition of care team here at [facility].  Transportation should occur somewhere around 1230.   Education offered today regarding  the importance of continued conversation with family and their  medical providers regarding overall plan of care and treatment options,  ensuring decisions are within the context of the patients values and GOCs.  Questions and concerns addressed   Discussed with primary team and nursing staff   Time: 50 minutes  Detailed review of medical records ( labs, imaging, vital signs), medically appropriate exam ( MS, skin, cardiac,  resp)   discussed with treatment team, counseling and education to patient, family, staff, documenting clinical information, medication management, coordination of care    Lorinda Creed NP  Palliative Medicine Team Team Phone # [phone] Pager [phone]

## 2023-05-30 ENCOUNTER — Encounter: Payer: Self-pay | Admitting: Cardiovascular Disease

## 2023-05-30 ENCOUNTER — Encounter: Payer: Self-pay | Admitting: Nurse Practitioner

## 2023-05-31 ENCOUNTER — Encounter: Payer: Medicare HMO | Admitting: Family

## 2023-06-01 DEATH — deceased

## 2023-06-04 ENCOUNTER — Ambulatory Visit: Payer: Medicare HMO | Admitting: Nurse Practitioner

## 2023-06-06 ENCOUNTER — Ambulatory Visit: Payer: Medicare HMO | Admitting: Cardiovascular Disease

## 2023-09-06 ENCOUNTER — Encounter: Payer: Medicare HMO | Admitting: Nurse Practitioner
